# Patient Record
Sex: Male | Born: 1940 | Race: Black or African American | Hispanic: No | Marital: Married | Smoking: Former smoker
Health system: Southern US, Community
[De-identification: ages and names within clinical notes are randomized; demographics above are authoritative.]

## PROBLEM LIST (undated history)

## (undated) DIAGNOSIS — F0390 Unspecified dementia without behavioral disturbance: Secondary | ICD-10-CM

## (undated) DIAGNOSIS — I10 Essential (primary) hypertension: Secondary | ICD-10-CM

## (undated) DIAGNOSIS — Z531 Procedure and treatment not carried out because of patient's decision for reasons of belief and group pressure: Secondary | ICD-10-CM

## (undated) DIAGNOSIS — J449 Chronic obstructive pulmonary disease, unspecified: Secondary | ICD-10-CM

## (undated) DIAGNOSIS — I2510 Atherosclerotic heart disease of native coronary artery without angina pectoris: Secondary | ICD-10-CM

## (undated) DIAGNOSIS — K219 Gastro-esophageal reflux disease without esophagitis: Secondary | ICD-10-CM

## (undated) DIAGNOSIS — I639 Cerebral infarction, unspecified: Secondary | ICD-10-CM

## (undated) DIAGNOSIS — E119 Type 2 diabetes mellitus without complications: Secondary | ICD-10-CM

## (undated) DIAGNOSIS — IMO0001 Reserved for inherently not codable concepts without codable children: Secondary | ICD-10-CM

## (undated) DIAGNOSIS — M1990 Unspecified osteoarthritis, unspecified site: Secondary | ICD-10-CM

## (undated) HISTORY — PX: TESTICLE SURGERY: SHX794

---

## 1962-07-26 HISTORY — PX: OTHER SURGICAL HISTORY: SHX169

## 1999-03-17 ENCOUNTER — Emergency Department (HOSPITAL_COMMUNITY): Admission: EM | Admit: 1999-03-17 | Discharge: 1999-03-17 | Payer: Self-pay

## 2000-05-26 ENCOUNTER — Ambulatory Visit (HOSPITAL_COMMUNITY): Admission: RE | Admit: 2000-05-26 | Discharge: 2000-05-26 | Payer: Self-pay | Admitting: Neurology

## 2000-06-06 ENCOUNTER — Ambulatory Visit (HOSPITAL_COMMUNITY): Admission: RE | Admit: 2000-06-06 | Discharge: 2000-06-06 | Payer: Self-pay | Admitting: Neurology

## 2000-06-06 ENCOUNTER — Encounter: Payer: Self-pay | Admitting: Neurology

## 2000-07-07 ENCOUNTER — Ambulatory Visit (HOSPITAL_COMMUNITY): Admission: RE | Admit: 2000-07-07 | Discharge: 2000-07-07 | Payer: Self-pay | Admitting: *Deleted

## 2000-07-26 HISTORY — PX: TOTAL KNEE ARTHROPLASTY: SHX125

## 2000-11-14 ENCOUNTER — Inpatient Hospital Stay (HOSPITAL_COMMUNITY): Admission: RE | Admit: 2000-11-14 | Discharge: 2000-11-18 | Payer: Self-pay | Admitting: Orthopaedic Surgery

## 2000-12-10 ENCOUNTER — Emergency Department (HOSPITAL_COMMUNITY): Admission: EM | Admit: 2000-12-10 | Discharge: 2000-12-10 | Payer: Self-pay | Admitting: Emergency Medicine

## 2000-12-16 ENCOUNTER — Encounter: Admission: RE | Admit: 2000-12-16 | Discharge: 2001-03-16 | Payer: Self-pay | Admitting: Orthopedic Surgery

## 2001-06-04 ENCOUNTER — Emergency Department (HOSPITAL_COMMUNITY): Admission: EM | Admit: 2001-06-04 | Discharge: 2001-06-04 | Payer: Self-pay

## 2014-01-15 ENCOUNTER — Encounter (HOSPITAL_BASED_OUTPATIENT_CLINIC_OR_DEPARTMENT_OTHER): Payer: Self-pay | Admitting: Emergency Medicine

## 2014-01-15 ENCOUNTER — Emergency Department (HOSPITAL_BASED_OUTPATIENT_CLINIC_OR_DEPARTMENT_OTHER): Payer: Medicare Other

## 2014-01-15 ENCOUNTER — Inpatient Hospital Stay (HOSPITAL_BASED_OUTPATIENT_CLINIC_OR_DEPARTMENT_OTHER)
Admission: EM | Admit: 2014-01-15 | Discharge: 2014-01-18 | DRG: 066 | Disposition: A | Discharge status: Rehabilitation Facility | Payer: Medicare Other | Attending: Internal Medicine | Admitting: Internal Medicine

## 2014-01-15 ENCOUNTER — Inpatient Hospital Stay
Admission: RE | Admit: 2014-01-15 | Payer: Self-pay | Source: Other Acute Inpatient Hospital | Admitting: Internal Medicine

## 2014-01-15 DIAGNOSIS — G589 Mononeuropathy, unspecified: Secondary | ICD-10-CM

## 2014-01-15 DIAGNOSIS — I69998 Other sequelae following unspecified cerebrovascular disease: Secondary | ICD-10-CM

## 2014-01-15 DIAGNOSIS — R413 Other amnesia: Secondary | ICD-10-CM | POA: Diagnosis present

## 2014-01-15 DIAGNOSIS — E1142 Type 2 diabetes mellitus with diabetic polyneuropathy: Secondary | ICD-10-CM | POA: Diagnosis present

## 2014-01-15 DIAGNOSIS — I6350 Cerebral infarction due to unspecified occlusion or stenosis of unspecified cerebral artery: Principal | ICD-10-CM | POA: Diagnosis present

## 2014-01-15 DIAGNOSIS — W19XXXA Unspecified fall, initial encounter: Secondary | ICD-10-CM

## 2014-01-15 DIAGNOSIS — I2510 Atherosclerotic heart disease of native coronary artery without angina pectoris: Secondary | ICD-10-CM | POA: Diagnosis present

## 2014-01-15 DIAGNOSIS — G629 Polyneuropathy, unspecified: Secondary | ICD-10-CM | POA: Diagnosis present

## 2014-01-15 DIAGNOSIS — R471 Dysarthria and anarthria: Secondary | ICD-10-CM | POA: Diagnosis present

## 2014-01-15 DIAGNOSIS — J4489 Other specified chronic obstructive pulmonary disease: Secondary | ICD-10-CM | POA: Diagnosis present

## 2014-01-15 DIAGNOSIS — Z9181 History of falling: Secondary | ICD-10-CM

## 2014-01-15 DIAGNOSIS — E785 Hyperlipidemia, unspecified: Secondary | ICD-10-CM | POA: Diagnosis present

## 2014-01-15 DIAGNOSIS — R29898 Other symptoms and signs involving the musculoskeletal system: Secondary | ICD-10-CM | POA: Diagnosis present

## 2014-01-15 DIAGNOSIS — G459 Transient cerebral ischemic attack, unspecified: Secondary | ICD-10-CM

## 2014-01-15 DIAGNOSIS — F411 Generalized anxiety disorder: Secondary | ICD-10-CM | POA: Diagnosis present

## 2014-01-15 DIAGNOSIS — Z7982 Long term (current) use of aspirin: Secondary | ICD-10-CM

## 2014-01-15 DIAGNOSIS — R001 Bradycardia, unspecified: Secondary | ICD-10-CM | POA: Diagnosis present

## 2014-01-15 DIAGNOSIS — F3289 Other specified depressive episodes: Secondary | ICD-10-CM | POA: Diagnosis present

## 2014-01-15 DIAGNOSIS — M6281 Muscle weakness (generalized): Secondary | ICD-10-CM

## 2014-01-15 DIAGNOSIS — F329 Major depressive disorder, single episode, unspecified: Secondary | ICD-10-CM | POA: Diagnosis present

## 2014-01-15 DIAGNOSIS — Z794 Long term (current) use of insulin: Secondary | ICD-10-CM

## 2014-01-15 DIAGNOSIS — J449 Chronic obstructive pulmonary disease, unspecified: Secondary | ICD-10-CM | POA: Diagnosis present

## 2014-01-15 DIAGNOSIS — R531 Weakness: Secondary | ICD-10-CM | POA: Diagnosis present

## 2014-01-15 DIAGNOSIS — Z87891 Personal history of nicotine dependence: Secondary | ICD-10-CM

## 2014-01-15 DIAGNOSIS — I639 Cerebral infarction, unspecified: Secondary | ICD-10-CM

## 2014-01-15 DIAGNOSIS — E1149 Type 2 diabetes mellitus with other diabetic neurological complication: Secondary | ICD-10-CM | POA: Diagnosis present

## 2014-01-15 DIAGNOSIS — I10 Essential (primary) hypertension: Secondary | ICD-10-CM | POA: Diagnosis present

## 2014-01-15 DIAGNOSIS — K219 Gastro-esophageal reflux disease without esophagitis: Secondary | ICD-10-CM | POA: Diagnosis present

## 2014-01-15 HISTORY — DX: Atherosclerotic heart disease of native coronary artery without angina pectoris: I25.10

## 2014-01-15 HISTORY — DX: Chronic obstructive pulmonary disease, unspecified: J44.9

## 2014-01-15 HISTORY — DX: Essential (primary) hypertension: I10

## 2014-01-15 HISTORY — DX: Cerebral infarction, unspecified: I63.9

## 2014-01-15 LAB — ETHANOL

## 2014-01-15 LAB — COMPREHENSIVE METABOLIC PANEL
ALBUMIN: 4 g/dL (ref 3.5–5.2)
ALK PHOS: 117 U/L (ref 39–117)
ALT: 36 U/L (ref 0–53)
AST: 43 U/L — AB (ref 0–37)
BILIRUBIN TOTAL: 0.7 mg/dL (ref 0.3–1.2)
BUN: 17 mg/dL (ref 6–23)
CO2: 25 meq/L (ref 19–32)
Calcium: 9.6 mg/dL (ref 8.4–10.5)
Chloride: 101 mEq/L (ref 96–112)
Creatinine, Ser: 1 mg/dL (ref 0.50–1.35)
GFR calc Af Amer: 85 mL/min — ABNORMAL LOW (ref >90)
GFR, EST NON AFRICAN AMERICAN: 74 mL/min — AB (ref >90)
Glucose, Bld: 218 mg/dL — ABNORMAL HIGH (ref 70–99)
POTASSIUM: 3.8 meq/L (ref 3.7–5.3)
Sodium: 140 mEq/L (ref 137–147)
Total Protein: 7.5 g/dL (ref 6.0–8.3)

## 2014-01-15 LAB — PROTIME-INR
INR: 1.1 (ref 0.00–1.49)
Prothrombin Time: 14 seconds (ref 11.6–15.2)

## 2014-01-15 LAB — DIFFERENTIAL
BASOS ABS: 0 10*3/uL (ref 0.0–0.1)
BASOS PCT: 0 % (ref 0–1)
Eosinophils Absolute: 0.2 10*3/uL (ref 0.0–0.7)
Eosinophils Relative: 2 % (ref 0–5)
Lymphocytes Relative: 33 % (ref 12–46)
Lymphs Abs: 3.3 10*3/uL (ref 0.7–4.0)
MONO ABS: 0.9 10*3/uL (ref 0.1–1.0)
Monocytes Relative: 9 % (ref 3–12)
NEUTROS PCT: 57 % (ref 43–77)
Neutro Abs: 5.6 10*3/uL (ref 1.7–7.7)

## 2014-01-15 LAB — CBC
HEMATOCRIT: 42.6 % (ref 39.0–52.0)
HEMOGLOBIN: 14.7 g/dL (ref 13.0–17.0)
MCH: 30.8 pg (ref 26.0–34.0)
MCHC: 34.5 g/dL (ref 30.0–36.0)
MCV: 89.3 fL (ref 78.0–100.0)
Platelets: 144 10*3/uL — ABNORMAL LOW (ref 150–400)
RBC: 4.77 MIL/uL (ref 4.22–5.81)
RDW: 13.7 % (ref 11.5–15.5)
WBC: 9.9 10*3/uL (ref 4.0–10.5)

## 2014-01-15 LAB — APTT: aPTT: 27 seconds (ref 24–37)

## 2014-01-15 LAB — TROPONIN I: Troponin I: <0.3 ng/mL (ref <0.30)

## 2014-01-15 MED ORDER — METOPROLOL TARTRATE 50 MG PO TABS
50.0000 mg | ORAL_TABLET | Freq: Two times a day (BID) | ORAL | Status: DC
  Administered 2014-01-15 – 2014-01-16 (×3): 50 mg via ORAL
  Administered 2014-01-17: 25 mg via ORAL
  Filled 2014-01-15: qty 1
  Filled 2014-01-15 (×2): qty 4
  Filled 2014-01-15 (×2): qty 1

## 2014-01-15 MED ORDER — HEPARIN SODIUM (PORCINE) 5000 UNIT/ML IJ SOLN
5000.0000 [IU] | Freq: Three times a day (TID) | INTRAMUSCULAR | Status: DC
Start: 2014-01-15 — End: 2014-01-18
  Administered 2014-01-15 – 2014-01-18 (×8): 5000 [IU] via SUBCUTANEOUS
  Filled 2014-01-15 (×8): qty 1

## 2014-01-15 MED ORDER — PANTOPRAZOLE SODIUM 40 MG PO TBEC
40.0000 mg | DELAYED_RELEASE_TABLET | Freq: Every day | ORAL | Status: DC
  Administered 2014-01-16 – 2014-01-18 (×4): 40 mg via ORAL
  Filled 2014-01-15 (×4): qty 1

## 2014-01-15 MED ORDER — SIMVASTATIN 10 MG PO TABS
10.0000 mg | ORAL_TABLET | Freq: Every day | ORAL | Status: DC
  Filled 2014-01-15: qty 1

## 2014-01-15 MED ORDER — ASPIRIN 81 MG PO CHEW
324.0000 mg | CHEWABLE_TABLET | Freq: Once | ORAL | Status: AC
  Administered 2014-01-15: 324 mg via ORAL

## 2014-01-15 MED ORDER — DONEPEZIL HCL 10 MG PO TABS
10.0000 mg | ORAL_TABLET | Freq: Every day | ORAL | Status: DC
  Administered 2014-01-15 – 2014-01-17 (×3): 10 mg via ORAL
  Filled 2014-01-15 (×3): qty 1

## 2014-01-15 MED ORDER — SIMVASTATIN 40 MG PO TABS
40.0000 mg | ORAL_TABLET | Freq: Every day | ORAL | Status: DC
  Administered 2014-01-15: 40 mg via ORAL
  Filled 2014-01-15: qty 1

## 2014-01-15 MED ORDER — AMLODIPINE BESYLATE 10 MG PO TABS
10.0000 mg | ORAL_TABLET | Freq: Every day | ORAL | Status: DC
  Administered 2014-01-16 – 2014-01-18 (×3): 10 mg via ORAL
  Filled 2014-01-15 (×3): qty 1

## 2014-01-15 MED ORDER — HYDROCODONE-ACETAMINOPHEN 5-325 MG PO TABS
1.0000 | ORAL_TABLET | Freq: Four times a day (QID) | ORAL | Status: DC | PRN
  Administered 2014-01-16 – 2014-01-18 (×5): 1 via ORAL
  Filled 2014-01-15 (×6): qty 1

## 2014-01-15 MED ORDER — ALBUTEROL SULFATE HFA 108 (90 BASE) MCG/ACT IN AERS: 1.0000 | INHALATION_SPRAY | Freq: Four times a day (QID) | RESPIRATORY_TRACT | Status: DC | PRN

## 2014-01-15 MED ORDER — INSULIN ASPART 100 UNIT/ML ~~LOC~~ SOLN
0.0000 [IU] | Freq: Every day | SUBCUTANEOUS | Status: DC
  Administered 2014-01-15: 5 [IU] via SUBCUTANEOUS
  Administered 2014-01-17: 2 [IU] via SUBCUTANEOUS

## 2014-01-15 MED ORDER — SERTRALINE HCL 100 MG PO TABS
200.0000 mg | ORAL_TABLET | Freq: Every day | ORAL | Status: DC
  Administered 2014-01-16 – 2014-01-18 (×3): 200 mg via ORAL
  Filled 2014-01-15: qty 2
  Filled 2014-01-15: qty 4
  Filled 2014-01-15: qty 2

## 2014-01-15 MED ORDER — BUPROPION HCL 100 MG PO TABS
100.0000 mg | ORAL_TABLET | Freq: Two times a day (BID) | ORAL | Status: DC
  Administered 2014-01-15 – 2014-01-18 (×6): 100 mg via ORAL
  Filled 2014-01-15 (×7): qty 1

## 2014-01-15 MED ORDER — QUETIAPINE FUMARATE 200 MG PO TABS
200.0000 mg | ORAL_TABLET | Freq: Every day | ORAL | Status: DC
  Administered 2014-01-17: 200 mg via ORAL
  Filled 2014-01-15: qty 1
  Filled 2014-01-15: qty 8
  Filled 2014-01-15: qty 1

## 2014-01-15 MED ORDER — ASPIRIN 325 MG PO TABS
325.0000 mg | ORAL_TABLET | Freq: Every day | ORAL | Status: DC
  Administered 2014-01-15 – 2014-01-17 (×3): 325 mg via ORAL
  Filled 2014-01-15 (×3): qty 1

## 2014-01-15 MED ORDER — SULINDAC 150 MG PO TABS
150.0000 mg | ORAL_TABLET | Freq: Two times a day (BID) | ORAL | Status: DC
  Administered 2014-01-16 – 2014-01-18 (×5): 150 mg via ORAL
  Filled 2014-01-15 (×7): qty 1

## 2014-01-15 MED ORDER — GABAPENTIN 600 MG PO TABS
600.0000 mg | ORAL_TABLET | Freq: Three times a day (TID) | ORAL | Status: DC
  Administered 2014-01-15 – 2014-01-18 (×8): 600 mg via ORAL
  Filled 2014-01-15 (×10): qty 1

## 2014-01-15 MED ORDER — INSULIN GLARGINE 100 UNIT/ML ~~LOC~~ SOLN
30.0000 [IU] | Freq: Two times a day (BID) | SUBCUTANEOUS | Status: DC
  Administered 2014-01-15: 30 [IU] via SUBCUTANEOUS
  Filled 2014-01-15 (×3): qty 0.3

## 2014-01-15 MED ORDER — LORATADINE 10 MG PO TABS
10.0000 mg | ORAL_TABLET | Freq: Every day | ORAL | Status: DC
  Administered 2014-01-16 – 2014-01-18 (×3): 10 mg via ORAL
  Filled 2014-01-15 (×3): qty 1

## 2014-01-15 MED ORDER — ASPIRIN 81 MG PO CHEW
CHEWABLE_TABLET | ORAL | Status: AC
  Filled 2014-01-15: qty 4

## 2014-01-15 MED ORDER — LOSARTAN POTASSIUM 50 MG PO TABS
100.0000 mg | ORAL_TABLET | Freq: Every day | ORAL | Status: DC
  Administered 2014-01-16 – 2014-01-18 (×3): 100 mg via ORAL
  Filled 2014-01-15 (×3): qty 2

## 2014-01-15 MED ORDER — ALBUTEROL SULFATE (2.5 MG/3ML) 0.083% IN NEBU: 2.5000 mg | INHALATION_SOLUTION | RESPIRATORY_TRACT | Status: DC | PRN

## 2014-01-15 MED ORDER — INSULIN ASPART 100 UNIT/ML ~~LOC~~ SOLN: 15.0000 [IU] | Freq: Three times a day (TID) | SUBCUTANEOUS | Status: DC

## 2014-01-15 MED ORDER — INSULIN ASPART 100 UNIT/ML ~~LOC~~ SOLN
0.0000 [IU] | Freq: Three times a day (TID) | SUBCUTANEOUS | Status: DC
  Administered 2014-01-16: 5 [IU] via SUBCUTANEOUS
  Administered 2014-01-16: 11 [IU] via SUBCUTANEOUS
  Administered 2014-01-17: 5 [IU] via SUBCUTANEOUS
  Administered 2014-01-17: 11 [IU] via SUBCUTANEOUS
  Administered 2014-01-18: 5 [IU] via SUBCUTANEOUS

## 2014-01-15 NOTE — Progress Notes (Signed)
Paged [facility] concerning physician coming to see him and patient orders.

## 2014-01-15 NOTE — ED Notes (Signed)
Pt to room 6 by [facility]. Pt is awake and alert, reports fall today "my leg gave out on me..." pt states he has had issues with weakness in this leg "for a long time" and has been seen by his pcp for same with no findings per pt. Pt states he hit his face on a hardwood floor, small dime sized red area beneath left eye, pt denies loc.  Pt states he is having low back pain, states he fractured his tailbone a week ago and cont to have pain.

## 2014-01-15 NOTE — ED Notes (Signed)
Pt states he has had a stroke in the past, and has some residual slight slurring of his speech, and the weakness in his right leg. Pt states both "been going on for years" denies any new neurological sx today.

## 2014-01-15 NOTE — ED Provider Notes (Signed)
CSN: [id number]     Arrival date & time 01/15/14  1407 History   First MD Initiated Contact with Patient 01/15/14 1425     Chief Complaint  Patient presents with  . Fall     (Consider location/radiation/quality/duration/timing/severity/associated sxs/prior Treatment) HPI Comments: He presents for evaluation of multiple falls over the last 3 days, resulting in facial bruising. He denies LOC, nausea vomiting or other illness. He feels that his right leg "gives out on me" and he falls. He states this has been going on for some time but has happened more than 10 times in the past 3 days. He reports he also has a facial droop around his mouth and he has been dropping items held in his right hand. He denies headaches, visual changes or numbness/tingling. The facial symptoms and hand weakness have been going on for the past 3 days. He denies any difficulty with word finding but has trouble speaking the words.   The history is provided by the patient. No language interpreter was used.     Past Medical History  Diagnosis Date  . Hypertension   . Diabetes mellitus without complication   . Coronary artery disease   . COPD (chronic obstructive pulmonary disease)   . Stroke    No past surgical history on file. No family history on file. History  Substance Use Topics  . Smoking status: Former Research scientist (life sciences)  . Smokeless tobacco: Not on file  . Alcohol Use: Not on file    Review of Systems  Constitutional: Negative for fever.  Eyes: Negative for visual disturbance.  Respiratory: Negative for shortness of breath.   Cardiovascular: Negative for chest pain.  Gastrointestinal: Negative for vomiting and abdominal pain.  Genitourinary: Negative for dysuria.  Musculoskeletal: Positive for back pain.  Skin: Positive for color change.  Neurological: Positive for facial asymmetry, speech difficulty and weakness. Negative for syncope, numbness and headaches.      Allergies  Lisinopril  Home  Medications   Prior to Admission medications   Medication Sig Start Date End Date Taking? Authorizing Provider  albuterol (PROVENTIL HFA;VENTOLIN HFA) 108 (90 BASE) MCG/ACT inhaler Inhale into the lungs every 6 (six) hours as needed for wheezing or shortness of breath.   Yes Historical Provider, MD  amLODipine (NORVASC) 10 MG tablet Take 10 mg by mouth daily.   Yes Historical Provider, MD  aspirin 81 MG tablet Take 81 mg by mouth daily.   Yes Historical Provider, MD  buPROPion (WELLBUTRIN) 100 MG tablet Take 100 mg by mouth 2 (two) times daily.   Yes Historical Provider, MD  carboxymethylcellulose (REFRESH PLUS) 0.5 % SOLN 1 drop 3 (three) times daily as needed.   Yes Historical Provider, MD  Cholecalciferol 2000 UNITS CAPS Take 4,000 Units by mouth daily.   Yes Historical Provider, MD  donepezil (ARICEPT) 10 MG tablet Take 10 mg by mouth at bedtime.   Yes Historical Provider, MD  gabapentin (NEURONTIN) 600 MG tablet Take 600 mg by mouth 3 (three) times daily.   Yes Historical Provider, MD  HYDROcodone-acetaminophen (NORCO/VICODIN) 5-325 MG per tablet Take 1 tablet by mouth every 6 (six) hours as needed for moderate pain.   Yes Historical Provider, MD  insulin aspart (NOVOLOG) 100 UNIT/ML injection Inject 15 Units into the skin 3 (three) times daily with meals.   Yes Historical Provider, MD  loratadine (CLARITIN) 10 MG tablet Take 10 mg by mouth daily.   Yes Historical Provider, MD  losartan (COZAAR) 100 MG tablet Take 100 mg  by mouth daily.   Yes Historical Provider, MD  metoprolol (LOPRESSOR) 50 MG tablet Take 50 mg by mouth 2 (two) times daily.   Yes Historical Provider, MD  pantoprazole (PROTONIX) 40 MG tablet Take 40 mg by mouth daily.   Yes Historical Provider, MD  pravastatin (PRAVACHOL) 20 MG tablet Take 20 mg by mouth daily.   Yes Historical Provider, MD  QUEtiapine (SEROQUEL) 200 MG tablet Take 200 mg by mouth at bedtime.   Yes Historical Provider, MD  sertraline (ZOLOFT) 100 MG tablet  Take 200 mg by mouth daily.   Yes Historical Provider, MD  sulindac (CLINORIL) 150 MG tablet Take 150 mg by mouth 2 (two) times daily.   Yes Historical Provider, MD   BP 137/73  Pulse 61  Temp(Src) 98.1 F (36.7 C) (Oral)  Resp 18  SpO2 93% Physical Exam  Constitutional: He is oriented to person, place, and time. He appears well-developed and well-nourished.  HENT:  Head: Normocephalic.  Neck: Normal range of motion. Neck supple.  Cardiovascular: Normal rate and regular rhythm.   Pulmonary/Chest: Effort normal and breath sounds normal.  Abdominal: Soft. Bowel sounds are normal. There is no tenderness. There is no rebound and no guarding.  Musculoskeletal: Normal range of motion.  Neurological: He is alert and oriented to person, place, and time. Coordination normal.  Weakness of right upper and lower extremity. There is facial asymmetry involving the right side mouth. No tongue deviation. He is oriented, memory intact with good insight and judgement.   Skin: Skin is warm and dry. No rash noted.  Psychiatric: He has a normal mood and affect.    ED Course  Procedures (including critical care time) Labs Review Labs Reviewed  CBC - Abnormal; Notable for the following:    Platelets 144 (*)    All other components within normal limits  COMPREHENSIVE METABOLIC PANEL - Abnormal; Notable for the following:    Glucose, Bld 218 (*)    AST 43 (*)    GFR calc non Af Amer 74 (*)    GFR calc Af Amer 85 (*)    All other components within normal limits  ETHANOL  PROTIME-INR  APTT  DIFFERENTIAL  TROPONIN I  URINE RAPID DRUG SCREEN (HOSP PERFORMED)  URINALYSIS, ROUTINE W REFLEX MICROSCOPIC    Imaging Review Dg Lumbar Spine Complete  01/15/2014   CLINICAL DATA:  Low back pain, continuous falls  EXAM: LUMBAR SPINE - COMPLETE 4+ VIEW  COMPARISON:  None.  FINDINGS: There are 5 non rib-bearing lumbar type vertebral bodies.  There is a mild scoliotic curvature of the thoracolumbar spine with  dominant inferior curvature convex to the left. There is mild straightening the expected lumbar lordosis. No anterolisthesis or retrolisthesis.  Lumbar vertebral body heights are preserved.  There is mild-to-moderate multilevel lumbar spine DDD, worse at T11-T12, L1-L2 and L5-S1 with disc space height loss, endplate irregularity and sclerosis.  Limited visualization the bilateral SI joints is normal.  Regional soft tissues appear normal.  IMPRESSION: 1. No acute findings. 2. Mild-to-moderate multilevel lumbar spine DDD.   Electronically Signed   By: Sandi Mariscal M.D.   On: 01/15/2014 15:26   Ct Head Wo Contrast  01/15/2014   CLINICAL DATA:  Post fall, now with injury in weakness  EXAM: CT HEAD WITHOUT CONTRAST  TECHNIQUE: Contiguous axial images were obtained from the base of the skull through the vertex without intravenous contrast.  COMPARISON:  None.  FINDINGS: There is an old lacunar infarct within the  left the coronal radiata (image 19, series 2). Scattered minimal periventricular hypodensities compatible with microvascular ischemic disease. The gray-white differentiation is otherwise well maintained without CT evidence of acute large territory infarct. No intraparenchymal or extra-axial mass or hemorrhage. Normal size and configuration of the ventricles and basilar cisterns. No midline shift. Limited visualization of the paranasal sinuses and mastoid air cells are normal. No air-fluid levels. Intracranial atherosclerosis. Regional soft tissues appear normal. No displaced calvarial fracture.  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   Electronically Signed   By: Sandi Mariscal M.D.   On: 01/15/2014 15:28     EKG Interpretation   Date/Time:  Tuesday January 15 2014 14:58:48 EDT Ventricular Rate:  55 PR Interval:  166 QRS Duration: 114 QT Interval:  452 QTC Calculation: 432 R Axis:   -48 Text Interpretation:  Sinus bradycardia Left anterior fascicular block   Nonspecific T wave abnormality Abnormal ECG Since last tracing rate slower  Confirmed by BELFI  MD, MELANIE ([id number]) on 01/15/2014 3:11:47 PM      MDM   Final diagnoses:  None    1. CVA  The patient has had symptoms of unilateral weakness, including facial asymmetry for the past 3 days. Not a code stroke, but neurologic event suspected. Discussed with [facility] who accepts the patient for admission. Discussed with Dr. Aram Beecham who will provide neurologic consultation.     Dewaine Oats, PA-C 01/20/14 0040

## 2014-01-15 NOTE — Consult Note (Signed)
Referring Physician: Posey Pronto    Chief Complaint: Right sided weakness, falls  HPI: Maurice Stone is an 73 y.o. male who reports that he has been falling for the past 6 years.  It has become more and more frequent and became a real issue this weekend.  He reports that he felt as if his right leg was weak.  Patient was seen by another facility at that time and discharged to home.  Patient's family is in the room tonight.  They report that visit was on Friday and they did not note a facial droop at that time.  He was seen again on Sunday and no facial droop was noted.  When his daughter talked to him on the phone yesterday she felt that his speech was slurred.  Today when sen by family he was felt to have a facial droop and was taken again to the ED.  On this evaluation he was felt to have right sided weakness and a possible stroke.  Patient was transferred here for further evaluation.    Date last known well: Unable to determine Time last known well: Unable to determine tPA Given: No: Unable to determine LKW  Past Medical History  Diagnosis Date  . Hypertension   . Diabetes mellitus without complication   . Coronary artery disease   . COPD (chronic obstructive pulmonary disease)   . Stroke     No past surgical history on file.  Family history: Mother deceased with DM.  Father deceased but no known medical problems.    Social History:  reports that he has quit smoking. He does not have any smokeless tobacco history on file. His alcohol and drug histories are not on file.  Allergies:  Allergies  Allergen Reactions  . Lisinopril Cough    Medications:  I have reviewed the patient's current medications. Prior to Admission:  Prescriptions prior to admission  Medication Sig Dispense Refill  . albuterol (PROVENTIL HFA;VENTOLIN HFA) 108 (90 BASE) MCG/ACT inhaler Inhale into the lungs every 6 (six) hours as needed for wheezing or shortness of breath.      Marland Kitchen amLODipine (NORVASC) 10 MG  tablet Take 10 mg by mouth daily.      Marland Kitchen aspirin 81 MG tablet Take 81 mg by mouth daily.      Marland Kitchen buPROPion (WELLBUTRIN) 100 MG tablet Take 100 mg by mouth 2 (two) times daily.      . carboxymethylcellulose (REFRESH PLUS) 0.5 % SOLN 1 drop 3 (three) times daily as needed.      . Cholecalciferol 2000 UNITS CAPS Take 4,000 Units by mouth daily.      Marland Kitchen donepezil (ARICEPT) 10 MG tablet Take 10 mg by mouth at bedtime.      . gabapentin (NEURONTIN) 600 MG tablet Take 600 mg by mouth 3 (three) times daily.      Marland Kitchen HYDROcodone-acetaminophen (NORCO/VICODIN) 5-325 MG per tablet Take 1 tablet by mouth every 6 (six) hours as needed for moderate pain.      Marland Kitchen insulin aspart (NOVOLOG) 100 UNIT/ML injection Inject 15 Units into the skin 3 (three) times daily with meals.      . insulin glargine (LANTUS) 100 UNIT/ML injection Inject 50 Units into the skin 2 (two) times daily.      Marland Kitchen loratadine (CLARITIN) 10 MG tablet Take 10 mg by mouth daily.      Marland Kitchen losartan (COZAAR) 100 MG tablet Take 100 mg by mouth daily.      . metoprolol (LOPRESSOR) 50  MG tablet Take 50 mg by mouth 2 (two) times daily.      . pantoprazole (PROTONIX) 40 MG tablet Take 40 mg by mouth daily.      . pravastatin (PRAVACHOL) 20 MG tablet Take 20 mg by mouth daily.      . QUEtiapine (SEROQUEL) 200 MG tablet Take 200 mg by mouth at bedtime.      . sertraline (ZOLOFT) 100 MG tablet Take 200 mg by mouth daily.      . sulindac (CLINORIL) 150 MG tablet Take 150 mg by mouth 2 (two) times daily.       Scheduled: . [START ON 01/16/2014] amLODipine  10 mg Oral Daily  . aspirin  325 mg Oral Daily  . buPROPion  100 mg Oral BID  . donepezil  10 mg Oral QHS  . gabapentin  600 mg Oral TID  . heparin  5,000 Units Subcutaneous 3 times per day  . [START ON 01/16/2014] insulin aspart  0-15 Units Subcutaneous TID WC  . insulin aspart  0-5 Units Subcutaneous QHS  . [START ON 01/16/2014] insulin aspart  15 Units Subcutaneous TID WC  . insulin glargine  30 Units  Subcutaneous BID  . [START ON 01/16/2014] loratadine  10 mg Oral Daily  . [START ON 01/16/2014] losartan  100 mg Oral Daily  . metoprolol  50 mg Oral BID  . pantoprazole  40 mg Oral Daily  . QUEtiapine  200 mg Oral QHS  . [START ON 01/16/2014] sertraline  200 mg Oral Daily  . simvastatin  10 mg Oral q1800  . simvastatin  40 mg Oral q1800  . [START ON 01/16/2014] sulindac  150 mg Oral BID WC    ROS: History obtained from the patient  General ROS: negative for - chills, fatigue, fever, night sweats, weight gain or weight loss Psychological ROS: negative for - behavioral disorder, hallucinations, memory difficulties, mood swings or suicidal ideation Ophthalmic ROS: negative for - blurry vision, double vision, eye pain or loss of vision ENT ROS: negative for - epistaxis, nasal discharge, oral lesions, sore throat, tinnitus or vertigo Allergy and Immunology ROS: negative for - hives or itchy/watery eyes Hematological and Lymphatic ROS: negative for - bleeding problems, bruising or swollen lymph nodes Endocrine ROS: negative for - galactorrhea, hair pattern changes, polydipsia/polyuria or temperature intolerance Respiratory ROS: negative for - cough, hemoptysis, shortness of breath or wheezing Cardiovascular ROS: negative for - chest pain, dyspnea on exertion, edema or irregular heartbeat Gastrointestinal ROS: negative for - abdominal pain, diarrhea, hematemesis, nausea/vomiting or stool incontinence Genito-Urinary ROS: negative for - dysuria, hematuria, incontinence or urinary frequency/urgency Musculoskeletal ROS: negative for - joint swelling or muscular weakness Neurological ROS: as noted in HPI Dermatological ROS: negative for rash and skin lesion changes  Physical Examination: Blood pressure 131/78, pulse 51, temperature 98.1 F (36.7 C), temperature source Oral, resp. rate 18, SpO2 100.00%.  Neurologic Examination: Mental Status: Alert, oriented, thought content appropriate.  Speech  fluent without evidence of aphasia but some dysarthria noted.  Able to follow 3 step commands without difficulty. Cranial Nerves: II: Discs flat bilaterally; Visual fields grossly normal, pupils equal, round, reactive to light and accommodation III,IV, VI: ptosis not present, extra-ocular motions intact bilaterally V,VII: right facial droop, facial light touch sensation normal bilaterally VIII: hearing normal bilaterally IX,X: gag reflex present XI: bilateral shoulder shrug XII: midline tongue extension Motor: Right : Upper extremity   4/5 with drift   Left:     Upper extremity   5/5  Lower extremity   4-/5      Lower extremity   5/5 Tone and bulk:normal tone throughout; no atrophy noted Sensory: Pinprick and light touch intact throughout, bilaterally Deep Tendon Reflexes: 2+ in the upper extremities, absent in the lower extremities Plantars: Right: mute   Left: downgoing Cerebellar: normal finger-to-nose and normal heel-to-shin testing Gait: Unable to test for safety concerns CV: pulses palpable throughout     Laboratory Studies:  Basic Metabolic Panel:  Recent Labs Lab 01/15/14 1445  NA 140  K 3.8  CL 101  CO2 25  GLUCOSE 218*  BUN 17  CREATININE 1.00  CALCIUM 9.6    Liver Function Tests:  Recent Labs Lab 01/15/14 1445  AST 43*  ALT 36  ALKPHOS 117  BILITOT 0.7  PROT 7.5  ALBUMIN 4.0   No results found for this basename: LIPASE, AMYLASE,  in the last 168 hours No results found for this basename: AMMONIA,  in the last 168 hours  CBC:  Recent Labs Lab 01/15/14 1445  WBC 9.9  NEUTROABS 5.6  HGB 14.7  HCT 42.6  MCV 89.3  PLT 144*    Cardiac Enzymes:  Recent Labs Lab 01/15/14 1445  TROPONINI <0.30    BNP: No components found with this basename: POCBNP,   CBG: No results found for this basename: GLUCAP,  in the last 168 hours  Microbiology: No results found for this or any previous visit.  Coagulation Studies:  Recent Labs   01/15/14 1445  LABPROT 14.0  INR 1.10    Urinalysis: No results found for this basename: COLORURINE, APPERANCEUR, LABSPEC, PHURINE, GLUCOSEU, HGBUR, BILIRUBINUR, KETONESUR, PROTEINUR, UROBILINOGEN, NITRITE, LEUKOCYTESUR,  in the last 168 hours  Lipid Panel: No results found for this basename: chol, trig, hdl, cholhdl, vldl, ldlcalc    HgbA1C:  No results found for this basename: HGBA1C    Urine Drug Screen:   No results found for this basename: labopia, cocainscrnur, labbenz, amphetmu, thcu, labbarb    Alcohol Level:  Recent Labs Lab 01/15/14 1445  ETH <11    Other results: EKG: sinus bradycardia at 55 bpm.  Imaging: Dg Lumbar Spine Complete  01/15/2014   CLINICAL DATA:  Low back pain, continuous falls  EXAM: LUMBAR SPINE - COMPLETE 4+ VIEW  COMPARISON:  None.  FINDINGS: There are 5 non rib-bearing lumbar type vertebral bodies.  There is a mild scoliotic curvature of the thoracolumbar spine with dominant inferior curvature convex to the left. There is mild straightening the expected lumbar lordosis. No anterolisthesis or retrolisthesis.  Lumbar vertebral body heights are preserved.  There is mild-to-moderate multilevel lumbar spine DDD, worse at T11-T12, L1-L2 and L5-S1 with disc space height loss, endplate irregularity and sclerosis.  Limited visualization the bilateral SI joints is normal.  Regional soft tissues appear normal.  IMPRESSION: 1. No acute findings. 2. Mild-to-moderate multilevel lumbar spine DDD.   Electronically Signed   By: Sandi Mariscal M.D.   On: 01/15/2014 15:26   Ct Head Wo Contrast  01/15/2014   CLINICAL DATA:  Post fall, now with injury in weakness  EXAM: CT HEAD WITHOUT CONTRAST  TECHNIQUE: Contiguous axial images were obtained from the base of the skull through the vertex without intravenous contrast.  COMPARISON:  None.  FINDINGS: There is an old lacunar infarct within the left the coronal radiata (image 19, series 2). Scattered minimal periventricular  hypodensities compatible with microvascular ischemic disease. The gray-white differentiation is otherwise well maintained without CT evidence of acute large territory infarct. No  intraparenchymal or extra-axial mass or hemorrhage. Normal size and configuration of the ventricles and basilar cisterns. No midline shift. Limited visualization of the paranasal sinuses and mastoid air cells are normal. No air-fluid levels. Intracranial atherosclerosis. Regional soft tissues appear normal. No displaced calvarial fracture.  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   Electronically Signed   By: Sandi Mariscal M.D.   On: 01/15/2014 15:28    Assessment: 73 y.o. male presenting with right sided weakness. Neurological examination consistent with a left brain infarct.  Head CT reviewed and shows no acute changes.  Patient with multiple stroke risk factors.  Further work up recommended.  Patient on an aspirin a day at home.  Stroke Risk Factors - diabetes mellitus and hypertension  Plan: 1. HgbA1c, fasting lipid panel 2. MRI, MRA  of the brain without contrast 3. PT consult, OT consult, Speech consult 4. Echocardiogram 5. Carotid dopplers 6. Prophylactic therapy-Antiplatelet med: Aspirin - dose 325mg  daily 7. Risk factor modification 8. Telemetry monitoring 9. Frequent neuro checks   Alexis Goodell, MD [facility] [phone] 01/15/2014, 10:35 PM

## 2014-01-15 NOTE — Progress Notes (Signed)
Maurice Stone, is a 73 y.o. male, DOB - 1940-09-23, YBO:[id number]  H/O CVA, comes HTN, type 2 diabetes mellitus, GERD comes in with two-day history of poor balance with multiple falls, slurred speech along with weakness in his right leg, CT scan at [place] stable, however physical exam consistent of CVA. [facility] be transferred [facility] for further stroke workup, ED physician will inform neurology. Kindly call neurology upon arrival. Hemodynamically stable, requested to give 1 dose of full dose aspirin along with home dose statin right now.     Filed Vitals:   01/15/14 1422 01/15/14 1527 01/15/14 1540  BP: 137/73  129/68  Pulse: 61  53  Temp: 98.1 F (36.7 C) 98.1 F (36.7 C) 98.1 F (36.7 C)  TempSrc: Oral  Oral  Resp: 18    SpO2: 93%  97%        Data Review   Micro Results No results found for this or any previous visit (from the past 240 hour(s)).  Radiology Reports Dg Lumbar Spine Complete  01/15/2014   CLINICAL DATA:  Low back pain, continuous falls  EXAM: LUMBAR SPINE - COMPLETE 4+ VIEW  COMPARISON:  None.  FINDINGS: There are 5 non rib-bearing lumbar type vertebral bodies.  There is a mild scoliotic curvature of the thoracolumbar spine with dominant inferior curvature convex to the left. There is mild straightening the expected lumbar lordosis. No anterolisthesis or retrolisthesis.  Lumbar vertebral body heights are preserved.  There is mild-to-moderate multilevel lumbar spine DDD, worse at T11-T12, L1-L2 and L5-S1 with disc space height loss, endplate irregularity and sclerosis.  Limited visualization the bilateral SI joints is normal.  Regional soft tissues appear normal.  IMPRESSION: 1. No acute findings. 2. Mild-to-moderate multilevel lumbar spine DDD.   Electronically Signed   By: Sandi Mariscal M.D.   On: 01/15/2014 15:26   Ct Head Wo Contrast  01/15/2014   CLINICAL DATA:  Post fall, now with injury in weakness  EXAM: CT HEAD WITHOUT CONTRAST  TECHNIQUE:  Contiguous axial images were obtained from the base of the skull through the vertex without intravenous contrast.  COMPARISON:  None.  FINDINGS: There is an old lacunar infarct within the left the coronal radiata (image 19, series 2). Scattered minimal periventricular hypodensities compatible with microvascular ischemic disease. The gray-white differentiation is otherwise well maintained without CT evidence of acute large territory infarct. No intraparenchymal or extra-axial mass or hemorrhage. Normal size and configuration of the ventricles and basilar cisterns. No midline shift. Limited visualization of the paranasal sinuses and mastoid air cells are normal. No air-fluid levels. Intracranial atherosclerosis. Regional soft tissues appear normal. No displaced calvarial fracture.  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   Electronically Signed   By: Sandi Mariscal M.D.   On: 01/15/2014 15:28    CBC  Recent Labs Lab 01/15/14 1445  WBC 9.9  HGB 14.7  HCT 42.6  PLT 144*  MCV 89.3  MCH 30.8  MCHC 34.5  RDW 13.7  LYMPHSABS 3.3  MONOABS 0.9  EOSABS 0.2  BASOSABS 0.0    Chemistries   Recent Labs Lab 01/15/14 1445  NA 140  K 3.8  CL 101  CO2 25  GLUCOSE 218*  BUN 17  CREATININE 1.00  CALCIUM 9.6  AST 43*  ALT 36  ALKPHOS 117  BILITOT 0.7   ------------------------------------------------------------------------------------------------------------------ CrCl is unknown because there is no height on file for the current visit. ------------------------------------------------------------------------------------------------------------------ No results found for this basename: HGBA1C,  in the last 72 hours ------------------------------------------------------------------------------------------------------------------ No results found for this basename: CHOL, HDL, LDLCALC, TRIG, CHOLHDL, LDLDIRECT,  in the last 72  hours ------------------------------------------------------------------------------------------------------------------ No results found for this basename: TSH, T4TOTAL, FREET3, T3FREE, THYROIDAB,  in the last 72 hours ------------------------------------------------------------------------------------------------------------------ No results found for this basename: VITAMINB12, FOLATE, FERRITIN, TIBC, IRON, RETICCTPCT,  in the last 72 hours  Coagulation profile  Recent Labs Lab 01/15/14 1445  INR 1.10    No results found for this basename: DDIMER,  in the last 72 hours  Cardiac Enzymes  Recent Labs Lab 01/15/14 1445  TROPONINI <0.30   ------------------------------------------------------------------------------------------------------------------ No components found with this basename: POCBNP,

## 2014-01-15 NOTE — H&P (Signed)
[facility] History and Physical  Patient: Maurice Stone  WCH:[id number]  DOB: 1941/01/12  DOS: the patient was seen and examined on 01/15/2014 PCP: No primary provider on file.  Chief Complaint: Fall  HPI: Maurice Stone is a 73 y.o. male with Past medical history of Hypertension, diabetes, coronary artery disease, COPD, CVA, peripheral neuropathy. Patient presents with complaints of multiple fall he mentions that since last he has been having multiple recurrent fall. All the fall happened while he has been walking his right leg suddenly gives away. He mentions he has nearly 10-15 falls in the last 2 days. He has been admitted at the [facility] twice for the same complaint in the last 1 month at which time he was discharged home as they were not able to find any objective patient. He mentions he had last MRI done 4 months ago. There was a history that he has further or slurring of speech as well as drop in his mouth but he mentions he has a slurred speech at his baseline somewhat. Denies any complaint of fever, chills, chest pain, nausea, vomiting, diarrhea, burning urination. He mentions he had hit his head and has some pain and redness there and also has fell on his buttocks and has some pain on his tailbone.  The patient is coming from home. And at his baseline independent for most of his ADL.  Review of Systems: as mentioned in the history of present illness.  A Comprehensive review of the other systems is negative.  Past Medical History  Diagnosis Date  . Hypertension   . Diabetes mellitus without complication   . Coronary artery disease   . COPD (chronic obstructive pulmonary disease)   . Stroke    No past surgical history on file. Social History:  reports that he has quit smoking. He does not have any smokeless tobacco history on file. His alcohol and drug histories are not on file.  Allergies  Allergen Reactions  . Lisinopril Cough    No family history on  file.  Prior to Admission medications   Medication Sig Start Date End Date Taking? Authorizing Provider  albuterol (PROVENTIL HFA;VENTOLIN HFA) 108 (90 BASE) MCG/ACT inhaler Inhale into the lungs every 6 (six) hours as needed for wheezing or shortness of breath.   Yes Historical Provider, MD  amLODipine (NORVASC) 10 MG tablet Take 10 mg by mouth daily.   Yes Historical Provider, MD  aspirin 81 MG tablet Take 81 mg by mouth daily.   Yes Historical Provider, MD  buPROPion (WELLBUTRIN) 100 MG tablet Take 100 mg by mouth 2 (two) times daily.   Yes Historical Provider, MD  carboxymethylcellulose (REFRESH PLUS) 0.5 % SOLN 1 drop 3 (three) times daily as needed.   Yes Historical Provider, MD  Cholecalciferol 2000 UNITS CAPS Take 4,000 Units by mouth daily.   Yes Historical Provider, MD  donepezil (ARICEPT) 10 MG tablet Take 10 mg by mouth at bedtime.   Yes Historical Provider, MD  gabapentin (NEURONTIN) 600 MG tablet Take 600 mg by mouth 3 (three) times daily.   Yes Historical Provider, MD  HYDROcodone-acetaminophen (NORCO/VICODIN) 5-325 MG per tablet Take 1 tablet by mouth every 6 (six) hours as needed for moderate pain.   Yes Historical Provider, MD  insulin aspart (NOVOLOG) 100 UNIT/ML injection Inject 15 Units into the skin 3 (three) times daily with meals.   Yes Historical Provider, MD  insulin glargine (LANTUS) 100 UNIT/ML injection Inject 50 Units into the skin 2 (two)  times daily.   Yes Historical Provider, MD  loratadine (CLARITIN) 10 MG tablet Take 10 mg by mouth daily.   Yes Historical Provider, MD  losartan (COZAAR) 100 MG tablet Take 100 mg by mouth daily.   Yes Historical Provider, MD  metoprolol (LOPRESSOR) 50 MG tablet Take 50 mg by mouth 2 (two) times daily.   Yes Historical Provider, MD  pantoprazole (PROTONIX) 40 MG tablet Take 40 mg by mouth daily.   Yes Historical Provider, MD  pravastatin (PRAVACHOL) 20 MG tablet Take 20 mg by mouth daily.   Yes Historical Provider, MD  QUEtiapine  (SEROQUEL) 200 MG tablet Take 200 mg by mouth at bedtime.   Yes Historical Provider, MD  sertraline (ZOLOFT) 100 MG tablet Take 200 mg by mouth daily.   Yes Historical Provider, MD  sulindac (CLINORIL) 150 MG tablet Take 150 mg by mouth 2 (two) times daily.   Yes Historical Provider, MD    Physical Exam: Filed Vitals:   01/15/14 1527 01/15/14 1540 01/15/14 1715 01/15/14 1900  BP:  129/68 127/71 132/76  Pulse:  53 53 52  Temp: 98.1 F (36.7 C) 98.1 F (36.7 C) 98.7 F (37.1 C) 98.2 F (36.8 C)  TempSrc:  Oral  Oral  Resp:      SpO2:  97% 97% 100%    General: Alert, Awake and Oriented to Time, Place and Person. Appear in mild distress Eyes: PERRL ENT: Oral Mucosa clear moist. Neck: no JVD Cardiovascular: S1 and S2 Present, no Murmur, Peripheral Pulses Present Respiratory: Bilateral Air entry equal and Decreased, Clear to Auscultation,  no Crackles,no wheezes Abdomen: Bowel Sound Present, Soft and Non tender Skin: no Rash Extremities: no Pedal edema, no calf tenderness Neurologic: Grossly no focal neuro deficit.   Labs on Admission:  CBC:  Recent Labs Lab 01/15/14 1445  WBC 9.9  NEUTROABS 5.6  HGB 14.7  HCT 42.6  MCV 89.3  PLT 144*    CMP     Component Value Date/Time   NA 140 01/15/2014 1445   K 3.8 01/15/2014 1445   CL 101 01/15/2014 1445   CO2 25 01/15/2014 1445   GLUCOSE 218* 01/15/2014 1445   BUN 17 01/15/2014 1445   CREATININE 1.00 01/15/2014 1445   CALCIUM 9.6 01/15/2014 1445   PROT 7.5 01/15/2014 1445   ALBUMIN 4.0 01/15/2014 1445   AST 43* 01/15/2014 1445   ALT 36 01/15/2014 1445   ALKPHOS 117 01/15/2014 1445   BILITOT 0.7 01/15/2014 1445   GFRNONAA 74* 01/15/2014 1445   GFRAA 85* 01/15/2014 1445    No results found for this basename: LIPASE, AMYLASE,  in the last 168 hours No results found for this basename: AMMONIA,  in the last 168 hours   Recent Labs Lab 01/15/14 1445  TROPONINI <0.30   BNP (last 3 results) No results found for this basename:  PROBNP,  in the last 8760 hours  Radiological Exams on Admission: Dg Lumbar Spine Complete  01/15/2014   CLINICAL DATA:  Low back pain, continuous falls  EXAM: LUMBAR SPINE - COMPLETE 4+ VIEW  COMPARISON:  None.  FINDINGS: There are 5 non rib-bearing lumbar type vertebral bodies.  There is a mild scoliotic curvature of the thoracolumbar spine with dominant inferior curvature convex to the left. There is mild straightening the expected lumbar lordosis. No anterolisthesis or retrolisthesis.  Lumbar vertebral body heights are preserved.  There is mild-to-moderate multilevel lumbar spine DDD, worse at T11-T12, L1-L2 and L5-S1 with disc space height loss, endplate  irregularity and sclerosis.  Limited visualization the bilateral SI joints is normal.  Regional soft tissues appear normal.  IMPRESSION: 1. No acute findings. 2. Mild-to-moderate multilevel lumbar spine DDD.   Electronically Signed   By: Sandi Mariscal M.D.   On: 01/15/2014 15:26   Ct Head Wo Contrast  01/15/2014   CLINICAL DATA:  Post fall, now with injury in weakness  EXAM: CT HEAD WITHOUT CONTRAST  TECHNIQUE: Contiguous axial images were obtained from the base of the skull through the vertex without intravenous contrast.  COMPARISON:  None.  FINDINGS: There is an old lacunar infarct within the left the coronal radiata (image 19, series 2). Scattered minimal periventricular hypodensities compatible with microvascular ischemic disease. The gray-white differentiation is otherwise well maintained without CT evidence of acute large territory infarct. No intraparenchymal or extra-axial mass or hemorrhage. Normal size and configuration of the ventricles and basilar cisterns. No midline shift. Limited visualization of the paranasal sinuses and mastoid air cells are normal. No air-fluid levels. Intracranial atherosclerosis. Regional soft tissues appear normal. No displaced calvarial fracture.  IMPRESSION: Microvascular ischemic disease without acute intracranial  process.   Electronically Signed   By: Sandi Mariscal M.D.   On: 01/15/2014 15:28   EKG: Independently reviewed. normal sinus rhythm, nonspecific ST and T waves changes.  Assessment/Plan Principal Problem:   TIA (transient ischemic attack) Active Problems:   Essential hypertension, benign   Bradycardia   chronic Right sided weakness   GERD (gastroesophageal reflux disease)   Neuropathy   1. TIA (transient ischemic attack) Patient presents with complaints of multiple fall. He has chronic right-sided weakness and chronic mild degree of slurred speech. At present he does not have further progression of his symptoms. Telemetry and EKG are normal. Initial CT of the head does not show any abnormality. Neurologic has been evaluated the patient and recommend further workup including MRI echocardiogram and continue with aspirin 325. We will monitor him on telemetry and observation for this neuro checks. Further workup depending on the results of the MRI.  2. Diabetes mellitus. 419 abdomen is a dose of 30 units and may resume 50 units twice a day from tomorrow. Also holding his regular short-acting insulin and placing him on a sliding scale. Diabetic diet after patient passes stroke evaluation.  3. Hypertension. Continue home antihypertensive medications from tomorrow.  4. Mood disorder. Continue with Seroquel Zoloft  5. Peripheral neuropathy Continue with gabapentin  6. Pain on his back. X-ray of the lumbar spine negative for any acute fracture at present we'll give him K apd and Norco.   Consults: Neurology  DVT Prophylaxis: subcutaneous Heparin Nutrition: N.p.o. until stroke evaluation  Code Status: Full  Disposition: Admitted to observation in telemetry unit.  Author: Berle Mull, MD [facility] Pager: [phone] 01/15/2014, 9:58 PM    If 7PM-7AM, please contact night-coverage [email or web address] Password [email or web address]

## 2014-01-16 ENCOUNTER — Observation Stay (HOSPITAL_COMMUNITY): Payer: Medicare Other

## 2014-01-16 DIAGNOSIS — I6350 Cerebral infarction due to unspecified occlusion or stenosis of unspecified cerebral artery: Principal | ICD-10-CM

## 2014-01-16 DIAGNOSIS — I517 Cardiomegaly: Secondary | ICD-10-CM

## 2014-01-16 LAB — GLUCOSE, CAPILLARY
GLUCOSE-CAPILLARY: 224 mg/dL — AB (ref 70–99)
GLUCOSE-CAPILLARY: 312 mg/dL — AB (ref 70–99)
Glucose-Capillary: 366 mg/dL — ABNORMAL HIGH (ref 70–99)
Glucose-Capillary: 228 mg/dL — ABNORMAL HIGH (ref 70–99)
Glucose-Capillary: 121 mg/dL — ABNORMAL HIGH (ref 70–99)

## 2014-01-16 LAB — RAPID URINE DRUG SCREEN, HOSP PERFORMED
Amphetamines: NOT DETECTED
BARBITURATES: NOT DETECTED
Benzodiazepines: NOT DETECTED
Cocaine: NOT DETECTED
Opiates: NOT DETECTED
TETRAHYDROCANNABINOL: NOT DETECTED

## 2014-01-16 LAB — URINALYSIS, ROUTINE W REFLEX MICROSCOPIC
Bilirubin Urine: NEGATIVE
Glucose, UA: >1000 mg/dL — AB
Hgb urine dipstick: NEGATIVE
Ketones, ur: NEGATIVE mg/dL
LEUKOCYTES UA: NEGATIVE
NITRITE: NEGATIVE
PH: 5.5 (ref 5.0–8.0)
Protein, ur: NEGATIVE mg/dL
SPECIFIC GRAVITY, URINE: 1.025 (ref 1.005–1.030)
Urobilinogen, UA: 1 mg/dL (ref 0.0–1.0)

## 2014-01-16 LAB — LIPID PANEL
CHOLESTEROL: 164 mg/dL (ref 0–200)
HDL: 40 mg/dL (ref >39)
LDL Cholesterol: 74 mg/dL (ref 0–99)
Total CHOL/HDL Ratio: 4.1 RATIO
Triglycerides: 248 mg/dL — ABNORMAL HIGH (ref <150)
VLDL: 50 mg/dL — ABNORMAL HIGH (ref 0–40)

## 2014-01-16 LAB — URINE MICROSCOPIC-ADD ON

## 2014-01-16 LAB — HEMOGLOBIN A1C
HEMOGLOBIN A1C: 10.6 % — AB (ref <5.7)
MEAN PLASMA GLUCOSE: 258 mg/dL — AB (ref <117)

## 2014-01-16 MED ORDER — INSULIN ASPART 100 UNIT/ML ~~LOC~~ SOLN
12.0000 [IU] | Freq: Three times a day (TID) | SUBCUTANEOUS | Status: DC
  Administered 2014-01-16 – 2014-01-17 (×4): 12 [IU] via SUBCUTANEOUS

## 2014-01-16 MED ORDER — FENOFIBRATE 160 MG PO TABS
160.0000 mg | ORAL_TABLET | Freq: Every day | ORAL | Status: DC
  Administered 2014-01-16 – 2014-01-18 (×3): 160 mg via ORAL
  Filled 2014-01-16 (×3): qty 1

## 2014-01-16 MED ORDER — ATORVASTATIN CALCIUM 10 MG PO TABS
20.0000 mg | ORAL_TABLET | Freq: Every day | ORAL | Status: DC
  Administered 2014-01-16 – 2014-01-17 (×2): 20 mg via ORAL
  Filled 2014-01-16 (×3): qty 2

## 2014-01-16 MED ORDER — INSULIN GLARGINE 100 UNIT/ML ~~LOC~~ SOLN
40.0000 [IU] | Freq: Two times a day (BID) | SUBCUTANEOUS | Status: DC
  Administered 2014-01-16 – 2014-01-17 (×3): 40 [IU] via SUBCUTANEOUS
  Filled 2014-01-16 (×4): qty 0.4

## 2014-01-16 NOTE — Progress Notes (Signed)
VASCULAR LAB PRELIMINARY  PRELIMINARY  PRELIMINARY  PRELIMINARY  Carotid duplex  completed.    Preliminary report:  Bilateral:  1-39% ICA stenosis.  Vertebral artery flow is antegrade.      CESTONE, HELENE, RVT 01/16/2014, 3:11 PM

## 2014-01-16 NOTE — Progress Notes (Signed)
  Echocardiogram 2D Echocardiogram has been performed.  Diamond Nickel 01/16/2014, 3:25 PM

## 2014-01-16 NOTE — Evaluation (Signed)
Physical Therapy Evaluation Patient Details Name: Maurice Stone MRN: [id number] DOB: 09-Feb-1941 Today's Date: 01/16/2014   History of Present Illness  Maurice Stone is an 73 y.o. male who reports that he has been falling for the past 6 years. It has become more and more frequent and became a real issue this weekend. He reports that he felt as if his right leg was weak. Patient was seen by another facility at that time and discharged to home. Patient's family is in the room tonight. They report that visit was on Friday and they did not note a facial droop at that time. He was seen again on Sunday and no facial droop was noted. When his daughter talked to him on the phone yesterday she felt that his speech was slurred. Today when sen by family he was felt to have a facial droop and was taken again to the ED. On this evaluation he was felt to have right sided weakness and a possible stroke.  Clinical Impression  Pt presenting with R hemiparesis and significant falls risk. Pt with freq falls a day due to R LE buckling and giving way. Pt to benefit from CIR to address deficits and achieve safe mod I function for safe transition home.    Follow Up Recommendations CIR;Supervision/Assistance - 24 hour    Equipment Recommendations  None recommended by PT    Recommendations for Other Services Rehab consult     Precautions / Restrictions Precautions Precautions: Fall Restrictions Weight Bearing Restrictions: No      Mobility  Bed Mobility Overal bed mobility: Needs Assistance Bed Mobility: Supine to Sit     Supine to sit: Mod assist     General bed mobility comments: assist for R LE management and trunk elevation  Transfers Overall transfer level: Needs assistance Equipment used: Rolling walker (2 wheeled) Transfers: Sit to/from [facility] Sit to Stand: Min assist Stand pivot transfers: Mod assist       General transfer comment: pt able to come to standing  with minimal assist but when asked to marching in place pt required maxA to maintain balance due to R LE buckling during [place]. Pt required modA for for stand pvt due to R knee buckling, support provided at pelvis and R knee to prevent pt from falling  Ambulation/Gait Ambulation/Gait assistance:  (unsafe at this time)              Financial trader Rankin (Stroke Patients Only) Modified Rankin (Stroke Patients Only) Pre-Morbid Rankin Score: Slight disability Modified Rankin: Severe disability     Balance Overall balance assessment: Needs assistance Sitting-balance support: Feet supported;No upper extremity supported Sitting balance-Leahy Scale: Fair Sitting balance - Comments: pt with posterior lean with dynamic activity, esp LE LAQ   Standing balance support: Bilateral upper extremity supported Standing balance-Leahy Scale: Poor Standing balance comment: pt requires RW to maintain static balance however unable to maintain dynamic balance due to R knee buckling                             Pertinent Vitals/Pain Denies pain    Home Living Family/patient expects to be discharged to:: Unsure                 Additional Comments: pt lives alone and only have family avail PRN. Pt to benefit from CIR vs  SNF.    Prior Function Level of Independence: Needs assistance   Gait / Transfers Assistance Needed: pt was amb safely with RW until recently, now he reports multiple falls a day  ADL's / Homemaking Assistance Needed: was indep until recently        Hand Dominance   Dominant Hand: Right    Extremity/Trunk Assessment   Upper Extremity Assessment: RUE deficits/detail RUE Deficits / Details: shld flex 3-/5, bicep 3+/5, tricep 3-/5, grip 3-/5   RUE Sensation:  (denies numbness)     Lower Extremity Assessment: RLE deficits/detail RLE Deficits / Details: grossly 3-/5, R knee buckling during WBing    Cervical /  Trunk Assessment: Normal  Communication   Communication: No difficulties  Cognition Arousal/Alertness: Awake/alert Behavior During Therapy: WFL for tasks assessed/performed Overall Cognitive Status: Within Functional Limits for tasks assessed                      General Comments      Exercises        Assessment/Plan    PT Assessment Patient needs continued PT services  PT Diagnosis Difficulty walking   PT Problem List Decreased strength;Decreased balance;Decreased activity tolerance;Decreased range of motion;Decreased mobility  PT Treatment Interventions DME instruction;Gait training;Functional mobility training;Therapeutic activities;Therapeutic exercise;Balance training;Neuromuscular re-education   PT Goals (Current goals can be found in the Care Plan section) Acute Rehab PT Goals Patient Stated Goal: to stop falling PT Goal Formulation: With patient Time For Goal Achievement: 01/30/14 Potential to Achieve Goals: Good    Frequency Min 4X/week   Barriers to discharge Decreased caregiver support pt lives alone    Co-evaluation               End of Session Equipment Utilized During Treatment: Gait belt Activity Tolerance: Patient tolerated treatment well Patient left: in chair;with call bell/phone within reach;with chair alarm set Nurse Communication: Mobility status    Functional Assessment Tool Used: clinical judgment Functional Limitation: Mobility: Walking and moving around Mobility: Walking and Moving Around Current Status [phone]): At least 60 percent but less than 80 percent impaired, limited or restricted Mobility: Walking and Moving Around Goal Status [phone]): At least 20 percent but less than 40 percent impaired, limited or restricted    Time: 0738-0807 PT Time Calculation (min): 29 min   Charges:   PT Evaluation $Initial PT Evaluation Tier I: 1 Procedure PT Treatments $Therapeutic Activity: 8-22 mins   PT G Codes:   Functional  Assessment Tool Used: clinical judgment Functional Limitation: Mobility: Walking and moving around    [place], [facility] 01/16/2014, 9:10 AM  Kittie Plater, PT, DPT Pager #: [phone] Office #: [phone]

## 2014-01-16 NOTE — Progress Notes (Signed)
Patient Demographics  Maurice Stone, is a 73 y.o. male, DOB - October 21, 1940, PPI:[id number]  Admit date - 01/15/2014   Admitting Physician Thurnell Lose, MD  Outpatient Primary MD for the patient is No primary provider on file.  LOS - 1   Chief Complaint  Patient presents with  . Fall           Subjective:   [name] today has, No headache, No chest pain, No abdominal pain - No Nausea, No new weakness tingling or numbness, No Cough - SOB.  Still has right-sided weakness which is improving.    Assessment & Plan    1.CVA - with acute on chronic right-sided weakness, we'll continue full stroke workup with monitoring on  on tele, obtain PT, OT, speech input, A1c and lipid panel, check MRI MRA brain, echogram, carotid duplex, neurology following, aspirin antiplatelet therapy per neurology the   No results found for this basename: HGBA1C    Lab Results  Component Value Date   CHOL 164 01/16/2014   HDL 40 01/16/2014   LDLCALC 74 01/16/2014   TRIG 248* 01/16/2014   CHOLHDL 4.1 01/16/2014       2.DM2 - pending A1c,  on Lantus, scheduled NovoLog along with sliding scale monitor  CBG (last 3)   Recent Labs  01/15/14 2321 01/16/14 0603 01/16/14 0736  GLUCAP 366* 224* 228*       3.HTN - continue present regimen which includes Norvasc, Cozaar and beta blocker.    4. Dyslipidemia. Increased statin dose and added TriCor for better control.    5. GERD on PPI continue.    6. Depression anxiety. On Zoloft Seroquel which will be continued.    7. Diabetic neuropathy. Supportive care.    8. Fall with low back pain. We'll check MRI of the spine as well.      Code Status: Full  Family Communication:    Disposition Plan: SNF   Procedures  CT head, MRI/MRA  brain, MRI L-spine, echogram, carotid duplex   Consults  Neuro   Medications  Scheduled Meds: . amLODipine  10 mg Oral Daily  . aspirin  325 mg Oral Daily  . buPROPion  100 mg Oral BID  . donepezil  10 mg Oral QHS  . gabapentin  600 mg Oral TID  . heparin  5,000 Units Subcutaneous 3 times per day  . insulin aspart  0-15 Units Subcutaneous TID WC  . insulin aspart  0-5 Units Subcutaneous QHS  . insulin aspart  15 Units Subcutaneous TID WC  . insulin glargine  30 Units Subcutaneous BID  . loratadine  10 mg Oral Daily  . losartan  100 mg Oral Daily  . metoprolol  50 mg Oral BID  . pantoprazole  40 mg Oral Daily  . QUEtiapine  200 mg Oral QHS  . sertraline  200 mg Oral Daily  . simvastatin  10 mg Oral q1800  . simvastatin  40 mg Oral q1800  . sulindac  150 mg Oral BID WC   Continuous Infusions:  PRN Meds:.albuterol, HYDROcodone-acetaminophen  DVT Prophylaxis   Heparin    Lab Results  Component Value Date   PLT 144* 01/15/2014    Antibiotics     Anti-infectives  None          Objective:   Filed Vitals:   01/16/14 0600 01/16/14 0653 01/16/14 0746 01/16/14 0809  BP:  125/68  128/67  Pulse:  44 53 49  Temp:  97.9 F (36.6 C)    TempSrc:  Oral    Resp: 20 18 20    SpO2:  94% 93% 93%    Wt Readings from Last 3 Encounters:  No data found for Wt     Intake/Output Summary (Last 24 hours) at 01/16/14 0951 Last data filed at 01/16/14 0123  Gross per 24 hour  Intake      0 ml  Output    300 ml  Net   -300 ml     Physical Exam  Awake Alert, Oriented X 3, No new F.N deficits, Normal affect, L side weakes than R [place].AT,PERRAL Supple Neck,No JVD, No cervical lymphadenopathy appriciated.  Symmetrical Chest wall movement, Good air movement bilaterally, CTAB RRR,No Gallops,Rubs or new Murmurs, No Parasternal Heave +ve B.Sounds, Abd Soft, No tenderness, No organomegaly appriciated, No rebound - guarding or rigidity. No Cyanosis, Clubbing or edema, No new Rash  or bruise      Data Review   Micro Results No results found for this or any previous visit (from the past 240 hour(s)).  Radiology Reports Dg Lumbar Spine Complete  01/15/2014   CLINICAL DATA:  Low back pain, continuous falls  EXAM: LUMBAR SPINE - COMPLETE 4+ VIEW  COMPARISON:  None.  FINDINGS: There are 5 non rib-bearing lumbar type vertebral bodies.  There is a mild scoliotic curvature of the thoracolumbar spine with dominant inferior curvature convex to the left. There is mild straightening the expected lumbar lordosis. No anterolisthesis or retrolisthesis.  Lumbar vertebral body heights are preserved.  There is mild-to-moderate multilevel lumbar spine DDD, worse at T11-T12, L1-L2 and L5-S1 with disc space height loss, endplate irregularity and sclerosis.  Limited visualization the bilateral SI joints is normal.  Regional soft tissues appear normal.  IMPRESSION: 1. No acute findings. 2. Mild-to-moderate multilevel lumbar spine DDD.   Electronically Signed   By: Sandi Mariscal M.D.   On: 01/15/2014 15:26   Ct Head Wo Contrast  01/15/2014   CLINICAL DATA:  Post fall, now with injury in weakness  EXAM: CT HEAD WITHOUT CONTRAST  TECHNIQUE: Contiguous axial images were obtained from the base of the skull through the vertex without intravenous contrast.  COMPARISON:  None.  FINDINGS: There is an old lacunar infarct within the left the coronal radiata (image 19, series 2). Scattered minimal periventricular hypodensities compatible with microvascular ischemic disease. The gray-white differentiation is otherwise well maintained without CT evidence of acute large territory infarct. No intraparenchymal or extra-axial mass or hemorrhage. Normal size and configuration of the ventricles and basilar cisterns. No midline shift. Limited visualization of the paranasal sinuses and mastoid air cells are normal. No air-fluid levels. Intracranial atherosclerosis. Regional soft tissues appear normal. No displaced calvarial  fracture.  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   Electronically Signed   By: Sandi Mariscal M.D.   On: 01/15/2014 15:28    CBC  Recent Labs Lab 01/15/14 1445  WBC 9.9  HGB 14.7  HCT 42.6  PLT 144*  MCV 89.3  MCH 30.8  MCHC 34.5  RDW 13.7  LYMPHSABS 3.3  MONOABS 0.9  EOSABS 0.2  BASOSABS 0.0    Chemistries   Recent Labs Lab 01/15/14 1445  NA 140  K 3.8  CL 101  CO2 25  GLUCOSE 218*  BUN 17  CREATININE 1.00  CALCIUM 9.6  AST 43*  ALT 36  ALKPHOS 117  BILITOT 0.7   ------------------------------------------------------------------------------------------------------------------ CrCl is unknown because there is no height on file for the current visit. ------------------------------------------------------------------------------------------------------------------ No results found for this basename: HGBA1C,  in the last 72 hours ------------------------------------------------------------------------------------------------------------------  Recent Labs  01/16/14 0605  CHOL 164  HDL 40  LDLCALC 74  TRIG 248*  CHOLHDL 4.1   ------------------------------------------------------------------------------------------------------------------ No results found for this basename: TSH, T4TOTAL, FREET3, T3FREE, THYROIDAB,  in the last 72 hours ------------------------------------------------------------------------------------------------------------------ No results found for this basename: VITAMINB12, FOLATE, FERRITIN, TIBC, IRON, RETICCTPCT,  in the last 72 hours  Coagulation profile  Recent Labs Lab 01/15/14 1445  INR 1.10    No results found for this basename: DDIMER,  in the last 72 hours  Cardiac Enzymes  Recent Labs Lab 01/15/14 1445  TROPONINI <0.30   ------------------------------------------------------------------------------------------------------------------ No components found with this basename:  POCBNP,      Time Spent in minutes 35   Maude Gloor K M.D on 01/16/2014 at 9:51 AM  Between 7am to 7pm - Pager - [phone]  After 7pm go to [email or web address] - password [email or web address]  And look for the night coverage person covering for me after hours  [facility] Office  [phone]   **Disclaimer: This note may have been dictated with voice recognition software. Similar sounding words can inadvertently be transcribed and this note may contain transcription errors which may not have been corrected upon publication of note.**

## 2014-01-16 NOTE — Progress Notes (Signed)
Rehab Admissions Coordinator Note:  Patient was screened by Cleatrice Burke for appropriateness for an Inpatient Acute Rehab Consult per PT recommendation. At this time, we are recommending would await further medical workup and diagnosis before determining rehab venue options. I will follow up.Cleatrice Burke 01/16/2014, 9:50 AM  I can be reached at [phone].

## 2014-01-16 NOTE — Progress Notes (Signed)
Stroke Team Progress Note  HISTORY Chief Complaint: Right sided weakness, falls  HPI: Maurice Stone is an 73 y.o. male who reports that he has been falling for the past 6 years. It has become more and more frequent and became a real issue this weekend. He reports that he felt as if his right leg was weak. Patient was seen by another facility at that time and discharged to home. Patient's family is in the room tonight. They report that visit was on Friday and they did not note a facial droop at that time. He was seen again on Sunday and no facial droop was noted. When his daughter talked to him on the phone yesterday she felt that his speech was slurred. Today when seen by family he was felt to have a facial droop and was taken again to the ED. On this evaluation he was felt to have right sided weakness and a possible stroke. Patient was transferred here for further evaluation.  Date last known well: Unable to determine  Time last known well: Unable to determine  tPA Given: No: Unable to determine LKW   Patient was not administered TPA secondary to Unable to determine LKW. He was admitted to the Internal Medicine Service for further evaluation and treatment. Neurology is consulting.   SUBJECTIVE  Friend is in the room with the patient. The patient is alert and cooperative, no complaints of headache or visual disturbance.  OBJECTIVE Most recent Vital Signs: Filed Vitals:   01/16/14 0535 01/16/14 0653 01/16/14 0746 01/16/14 0809  BP: 121/73 125/68  128/67  Pulse: 46 44 53 49  Temp: 98.4 F (36.9 C) 97.9 F (36.6 C)    TempSrc: Oral Oral    Resp: 18 18 20    SpO2: 98% 94% 93% 93%   CBG (last 3)   Recent Labs  01/15/14 2321 01/16/14 0603 01/16/14 0736  GLUCAP 366* 224* 228*    IV Fluid Intake:     MEDICATIONS  . amLODipine  10 mg Oral Daily  . aspirin  325 mg Oral Daily  . buPROPion  100 mg Oral BID  . donepezil  10 mg Oral QHS  . gabapentin  600 mg Oral TID  . heparin   5,000 Units Subcutaneous 3 times per day  . insulin aspart  0-15 Units Subcutaneous TID WC  . insulin aspart  0-5 Units Subcutaneous QHS  . insulin aspart  15 Units Subcutaneous TID WC  . insulin glargine  30 Units Subcutaneous BID  . loratadine  10 mg Oral Daily  . losartan  100 mg Oral Daily  . metoprolol  50 mg Oral BID  . pantoprazole  40 mg Oral Daily  . QUEtiapine  200 mg Oral QHS  . sertraline  200 mg Oral Daily  . simvastatin  10 mg Oral q1800  . simvastatin  40 mg Oral q1800  . sulindac  150 mg Oral BID WC   PRN:  albuterol, HYDROcodone-acetaminophen  Diet:  NPO  Activity:   Up with assistance DVT Prophylaxis: SQ heparin  CLINICALLY SIGNIFICANT STUDIES Basic Metabolic Panel:  Recent Labs Lab 01/15/14 1445  NA 140  K 3.8  CL 101  CO2 25  GLUCOSE 218*  BUN 17  CREATININE 1.00  CALCIUM 9.6   Liver Function Tests:  Recent Labs Lab 01/15/14 1445  AST 43*  ALT 36  ALKPHOS 117  BILITOT 0.7  PROT 7.5  ALBUMIN 4.0   CBC:  Recent Labs Lab 01/15/14 1445  WBC  9.9  NEUTROABS 5.6  HGB 14.7  HCT 42.6  MCV 89.3  PLT 144*   Coagulation:  Recent Labs Lab 01/15/14 1445  LABPROT 14.0  INR 1.10   Cardiac Enzymes:  Recent Labs Lab 01/15/14 1445  TROPONINI <0.30   Urinalysis:  Recent Labs Lab 01/16/14 0134  COLORURINE YELLOW  LABSPEC 1.025  PHURINE 5.5  GLUCOSEU >1000*  HGBUR NEGATIVE  BILIRUBINUR NEGATIVE  KETONESUR NEGATIVE  PROTEINUR NEGATIVE  UROBILINOGEN 1.0  NITRITE NEGATIVE  LEUKOCYTESUR NEGATIVE   Lipid Panel    Component Value Date/Time   CHOL 164 01/16/2014 0605   TRIG 248* 01/16/2014 0605   HDL 40 01/16/2014 0605   CHOLHDL 4.1 01/16/2014 0605   VLDL 50* 01/16/2014 0605   LDLCALC 74 01/16/2014 0605   HgbA1C  No results found for this basename: HGBA1C    Urine Drug Screen:     Component Value Date/Time   LABOPIA NONE DETECTED 01/16/2014 0134   COCAINSCRNUR NONE DETECTED 01/16/2014 0134   LABBENZ NONE DETECTED 01/16/2014 0134    AMPHETMU NONE DETECTED 01/16/2014 0134   THCU NONE DETECTED 01/16/2014 0134   LABBARB NONE DETECTED 01/16/2014 0134    Alcohol Level:  Recent Labs Lab 01/15/14 1445  ETH <11    Dg Lumbar Spine Complete  01/15/2014   CLINICAL DATA:  Low back pain, continuous falls  EXAM: LUMBAR SPINE - COMPLETE 4+ VIEW  COMPARISON:  None.  FINDINGS: There are 5 non rib-bearing lumbar type vertebral bodies.  There is a mild scoliotic curvature of the thoracolumbar spine with dominant inferior curvature convex to the left. There is mild straightening the expected lumbar lordosis. No anterolisthesis or retrolisthesis.  Lumbar vertebral body heights are preserved.  There is mild-to-moderate multilevel lumbar spine DDD, worse at T11-T12, L1-L2 and L5-S1 with disc space height loss, endplate irregularity and sclerosis.  Limited visualization the bilateral SI joints is normal.  Regional soft tissues appear normal.  IMPRESSION: 1. No acute findings. 2. Mild-to-moderate multilevel lumbar spine DDD.   Electronically Signed   By: Sandi Mariscal M.D.   On: 01/15/2014 15:26   Ct Head Wo Contrast  01/15/2014   CLINICAL DATA:  Post fall, now with injury in weakness  EXAM: CT HEAD WITHOUT CONTRAST  TECHNIQUE: Contiguous axial images were obtained from the base of the skull through the vertex without intravenous contrast.  COMPARISON:  None.  FINDINGS: There is an old lacunar infarct within the left the coronal radiata (image 19, series 2). Scattered minimal periventricular hypodensities compatible with microvascular ischemic disease. The gray-white differentiation is otherwise well maintained without CT evidence of acute large territory infarct. No intraparenchymal or extra-axial mass or hemorrhage. Normal size and configuration of the ventricles and basilar cisterns. No midline shift. Limited visualization of the paranasal sinuses and mastoid air cells are normal. No air-fluid levels. Intracranial atherosclerosis. Regional soft tissues  appear normal. No displaced calvarial fracture.  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   Electronically Signed   By: Sandi Mariscal M.D.   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   MRI of the brain    MRA of the brain    2D Echocardiogram    Carotid Doppler    CXR    EKG    Sinus bradycardia Left anterior fascicular block Nonspecific T wave abnormality Abnormal ECG Since last tracing rate slower  Therapy Recommendations CIR  Physical Exam  General:  The patient is alert and cooperative at the time of the examination.  Skin: No significant peripheral edema is noted.   Neurologic Exam  Mental status: The patient is oriented x 3.  Cranial nerves: Facial symmetry is not present. Very minimal depression of the right nasolabial fold. Dysarthria is noted. Extraocular movements are full. Visual fields are full.  Motor: The patient has good strength in the left extremities. The patient has some clumsiness of the right arm, trace weakness of the right leg.  Sensory examination: Soft touch sensation is symmetric on the face, arms, and legs.  Coordination: The patient has good finger-nose-finger and heel-to-shin on the left, slight clumsiness with finger-nose-finger with the right arm.  Gait and station: The gait was not tested. No drift is seen with the upper extremities.  Reflexes: Deep tendon reflexes are symmetric, but are depressed.    ASSESSMENT Maurice Stone is a 73 y.o. male presenting with worsening weakness on the right side, dysarthria. The patient has a prior history of a left brain stroke in the past, with residual right-sided weakness. The patient lives alone, walks with a cane usually. The patient was on aspirin prior to admission. He was not a TPA candidate secondary to delay in presentation. The stroke deficits came on gradually over  several days.   Probable left brain stroke secondary to intrinsic atherosclerosis intracranially  Hypertension  Diabetes  Coronary artery disease  COPD  Prior history of left brain stroke  Dyslipidemia  Memory disturbance   Hospital day # 1  TREATMENT/PLAN  MRI brain  MRA head  2-D echocardiogram  Carotid Doppler study  Possible CIR  Continue aspirin for now, may consider switch to Plavix  SIGNED Lenor Coffin   I have personally obtained a history, examined the patient, evaluated imaging results, and formulated the assessment and plan of care. I agree with the above.    To contact Stroke Continuity provider, please refer to [email or web address] After hours, contact General Neurology

## 2014-01-16 NOTE — Progress Notes (Signed)
Utilization review completed.  

## 2014-01-17 ENCOUNTER — Encounter (HOSPITAL_COMMUNITY): Payer: Self-pay | Admitting: *Deleted

## 2014-01-17 DIAGNOSIS — I6330 Cerebral infarction due to thrombosis of unspecified cerebral artery: Secondary | ICD-10-CM

## 2014-01-17 DIAGNOSIS — I639 Cerebral infarction, unspecified: Secondary | ICD-10-CM | POA: Diagnosis present

## 2014-01-17 LAB — GLUCOSE, CAPILLARY
GLUCOSE-CAPILLARY: 218 mg/dL — AB (ref 70–99)
Glucose-Capillary: 128 mg/dL — ABNORMAL HIGH (ref 70–99)
Glucose-Capillary: 100 mg/dL — ABNORMAL HIGH (ref 70–99)
Glucose-Capillary: 308 mg/dL — ABNORMAL HIGH (ref 70–99)
Glucose-Capillary: 201 mg/dL — ABNORMAL HIGH (ref 70–99)

## 2014-01-17 MED ORDER — INSULIN GLARGINE 100 UNIT/ML ~~LOC~~ SOLN
45.0000 [IU] | Freq: Two times a day (BID) | SUBCUTANEOUS | Status: DC
  Administered 2014-01-17 – 2014-01-18 (×2): 45 [IU] via SUBCUTANEOUS
  Filled 2014-01-17 (×3): qty 0.45

## 2014-01-17 MED ORDER — CLOPIDOGREL BISULFATE 75 MG PO TABS
75.0000 mg | ORAL_TABLET | Freq: Every day | ORAL | Status: DC
  Administered 2014-01-18: 75 mg via ORAL
  Filled 2014-01-17: qty 1

## 2014-01-17 MED ORDER — INSULIN ASPART 100 UNIT/ML ~~LOC~~ SOLN
15.0000 [IU] | Freq: Three times a day (TID) | SUBCUTANEOUS | Status: DC
  Administered 2014-01-17 – 2014-01-18 (×3): 15 [IU] via SUBCUTANEOUS

## 2014-01-17 NOTE — Consult Note (Signed)
Physical Medicine and Rehabilitation Consult Reason for Consult:CVA Referring Physician: [facility]   HPI: Maurice Stone is a 73 y.o. right handed male with history of hypertension, diabetes mellitus, CAD,COPD as well as CVA 6 years ago with some residual slurred speech maintained on aspirin. Patient lives alone independent prior to admission. Presented 01/15/2014 with frequent falls and right-sided weakness and slurred speech. MRI shows acute nonhemorrhagic left paramedian pontine infarct as well as remote infarct left basal ganglia. MRA of the head without stenosis or occlusion. Echocardiogram with ejection fraction 10% grade 1 diastolic dysfunction. Carotid Dopplers no ICA stenosis. Patient did not receive TPA. Neurology services consulted aspirin increased to 325 mg daily as well as the addition of subcutaneous heparin for DVT prophylaxis. Hemoglobin A1c of 10.6 with insulin therapy as directed. Patient is tolerating a regular consistency diet. Physical therapy evaluation completed 01/16/2014 with recommendations of physical medicine rehabilitation consult   Review of Systems  Gastrointestinal: Positive for constipation.  Musculoskeletal: Positive for falls.  Neurological: Positive for speech change and weakness.  Psychiatric/Behavioral: Positive for memory loss.  All other systems reviewed and are negative.  Past Medical History  Diagnosis Date  . Hypertension   . Diabetes mellitus without complication   . Coronary artery disease   . COPD (chronic obstructive pulmonary disease)   . Stroke    History reviewed. No pertinent past surgical history. History reviewed. No pertinent family history. Social History:  reports that he quit smoking about 41 years ago. His smoking use included Cigarettes. He smoked 0.00 packs per day for 10 years. He does not have any smokeless tobacco history on file. He reports that he drinks about .6 ounces of alcohol per week. He reports that he does not  use illicit drugs. Allergies:  Allergies  Allergen Reactions  . Lisinopril Cough   Medications Prior to Admission  Medication Sig Dispense Refill  . albuterol (PROVENTIL HFA;VENTOLIN HFA) 108 (90 BASE) MCG/ACT inhaler Inhale into the lungs every 6 (six) hours as needed for wheezing or shortness of breath.      Marland Kitchen amLODipine (NORVASC) 10 MG tablet Take 10 mg by mouth daily.      Marland Kitchen aspirin 81 MG tablet Take 81 mg by mouth daily.      Marland Kitchen buPROPion (WELLBUTRIN) 100 MG tablet Take 100 mg by mouth 2 (two) times daily.      . carboxymethylcellulose (REFRESH PLUS) 0.5 % SOLN 1 drop 3 (three) times daily as needed.      . Cholecalciferol 2000 UNITS CAPS Take 4,000 Units by mouth daily.      Marland Kitchen donepezil (ARICEPT) 10 MG tablet Take 10 mg by mouth at bedtime.      . gabapentin (NEURONTIN) 600 MG tablet Take 600 mg by mouth 3 (three) times daily.      Marland Kitchen HYDROcodone-acetaminophen (NORCO/VICODIN) 5-325 MG per tablet Take 1 tablet by mouth every 6 (six) hours as needed for moderate pain.      Marland Kitchen insulin aspart (NOVOLOG) 100 UNIT/ML injection Inject 15 Units into the skin 3 (three) times daily with meals.      . insulin glargine (LANTUS) 100 UNIT/ML injection Inject 50 Units into the skin 2 (two) times daily.      Marland Kitchen loratadine (CLARITIN) 10 MG tablet Take 10 mg by mouth daily.      Marland Kitchen losartan (COZAAR) 100 MG tablet Take 100 mg by mouth daily.      . metoprolol (LOPRESSOR) 50 MG tablet Take 50 mg  by mouth 2 (two) times daily.      . pantoprazole (PROTONIX) 40 MG tablet Take 40 mg by mouth daily.      . pravastatin (PRAVACHOL) 20 MG tablet Take 20 mg by mouth daily.      . QUEtiapine (SEROQUEL) 200 MG tablet Take 200 mg by mouth at bedtime.      . sertraline (ZOLOFT) 100 MG tablet Take 200 mg by mouth daily.      . sulindac (CLINORIL) 150 MG tablet Take 150 mg by mouth 2 (two) times daily.        Home: Home Living Family/patient expects to be discharged to:: Private residence Living Arrangements:  Alone Additional Comments: pt lives alone and only have family avail PRN. Pt to benefit from CIR vs SNF.  Functional History: Prior Function Level of Independence: Needs assistance Gait / Transfers Assistance Needed: pt was amb safely with RW until recently, now he reports multiple falls a day ADL's / Homemaking Assistance Needed: was indep until recently Functional Status:  Mobility: Bed Mobility Overal bed mobility: Needs Assistance Bed Mobility: Supine to Sit Supine to sit: Mod assist General bed mobility comments: assist for R LE management and trunk elevation Transfers Overall transfer level: Needs assistance Equipment used: Rolling walker (2 wheeled) Transfers: Sit to/from [facility] Sit to Stand: Min assist Stand pivot transfers: Mod assist General transfer comment: pt able to come to standing with minimal assist but when asked to marching in place pt required maxA to maintain balance due to R LE buckling during [place]. Pt required modA for for stand pvt due to R knee buckling, support provided at pelvis and R knee to prevent pt from falling Ambulation/Gait Ambulation/Gait assistance:  (unsafe at this time)    ADL:    Cognition: Cognition Overall Cognitive Status: Within Functional Limits for tasks assessed Orientation Level: Oriented X4 Cognition Arousal/Alertness: Awake/alert Behavior During Therapy: WFL for tasks assessed/performed Overall Cognitive Status: Within Functional Limits for tasks assessed  Blood pressure 108/65, pulse 43, temperature 98.1 F (36.7 C), temperature source Oral, resp. rate 20, height 5\' 10"  (1.778 m), weight 89.585 kg (197 lb 8 oz), SpO2 98.00%. Physical Exam  Constitutional: He is oriented to person, place, and time.  HENT:  Head: Normocephalic.  Eyes: EOM are normal.  Neck: Normal range of motion. Neck supple. No thyromegaly present.  Cardiovascular: Normal rate and regular rhythm.   Respiratory: Effort normal and  breath sounds normal. No respiratory distress.  GI: Soft. Bowel sounds are normal. He exhibits no distension.  Neurological: He is alert and oriented to person, place, and time.  Mildly dysarthric but fully intelligible. He follows full commands. RUE 4/5 prox to distal. RLE 3+ HF, KE, 4/5 ankle. Mild right facial weakness and tongue deviation. No gross sensory deficits. +PN RUE. Normal insight and awareness.   Skin: Skin is warm and dry.    Results for orders placed during the hospital encounter of 01/15/14 (from the past 24 hour(s))  GLUCOSE, CAPILLARY     Status: Abnormal   Collection Time    01/16/14 11:11 AM      Result Value Ref Range   Glucose-Capillary 312 (*) 70 - 99 mg/dL   Comment 1 Documented in Chart    GLUCOSE, CAPILLARY     Status: Abnormal   Collection Time    01/16/14  6:36 PM      Result Value Ref Range   Glucose-Capillary 121 (*) 70 - 99 mg/dL  GLUCOSE, CAPILLARY  Status: Abnormal   Collection Time    01/16/14 10:50 PM      Result Value Ref Range   Glucose-Capillary 128 (*) 70 - 99 mg/dL  GLUCOSE, CAPILLARY     Status: Abnormal   Collection Time    01/17/14  6:46 AM      Result Value Ref Range   Glucose-Capillary 100 (*) 70 - 99 mg/dL   Dg Lumbar Spine Complete  01/15/2014   CLINICAL DATA:  Low back pain, continuous falls  EXAM: LUMBAR SPINE - COMPLETE 4+ VIEW  COMPARISON:  None.  FINDINGS: There are 5 non rib-bearing lumbar type vertebral bodies.  There is a mild scoliotic curvature of the thoracolumbar spine with dominant inferior curvature convex to the left. There is mild straightening the expected lumbar lordosis. No anterolisthesis or retrolisthesis.  Lumbar vertebral body heights are preserved.  There is mild-to-moderate multilevel lumbar spine DDD, worse at T11-T12, L1-L2 and L5-S1 with disc space height loss, endplate irregularity and sclerosis.  Limited visualization the bilateral SI joints is normal.  Regional soft tissues appear normal.  IMPRESSION: 1.  No acute findings. 2. Mild-to-moderate multilevel lumbar spine DDD.   Electronically Signed   By: Sandi Mariscal M.D.   On: 01/15/2014 15:26   Ct Head Wo Contrast  01/15/2014   CLINICAL DATA:  Post fall, now with injury in weakness  EXAM: CT HEAD WITHOUT CONTRAST  TECHNIQUE: Contiguous axial images were obtained from the base of the skull through the vertex without intravenous contrast.  COMPARISON:  None.  FINDINGS: There is an old lacunar infarct within the left the coronal radiata (image 19, series 2). Scattered minimal periventricular hypodensities compatible with microvascular ischemic disease. The gray-white differentiation is otherwise well maintained without CT evidence of acute large territory infarct. No intraparenchymal or extra-axial mass or hemorrhage. Normal size and configuration of the ventricles and basilar cisterns. No midline shift. Limited visualization of the paranasal sinuses and mastoid air cells are normal. No air-fluid levels. Intracranial atherosclerosis. Regional soft tissues appear normal. No displaced calvarial fracture.  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   Electronically Signed   By: Sandi Mariscal M.D.   On: 01/15/2014 15:28   Mri Brain Without Contrast  01/16/2014   CLINICAL DATA:  Increasing frequency of falls. Right leg weakness. Slurred speech and facial drooping. TIAs.  EXAM: MRI HEAD WITHOUT CONTRAST  MRA HEAD WITHOUT CONTRAST  TECHNIQUE: Multiplanar, multiecho pulse sequences of the brain and surrounding structures were obtained without intravenous contrast. Angiographic images of the head were obtained using MRA technique without contrast.  COMPARISON:  CT head without contrast 01/15/2014. MRI brain 06/06/2000.  FINDINGS: MRI HEAD FINDINGS  Acute nonhemorrhagic left paramedian pontine infarct is evident. Associated T2 and FLAIR signal hyperintensity is associated. A remote infarct of the left basal ganglia and corona radiata is again  noted. Mild to moderate periventricular and subcortical white matter changes are noted bilaterally.  Flow is present in the major intracranial arteries. The globes and orbits are intact. The paranasal sinuses and mastoid air cells are clear.  MRA HEAD FINDINGS  Mild atherosclerotic irregularity is present within the cavernous carotid arteries bilaterally without significant stenosis. The A1 and M1 segments are normal. The anterior communicating artery is patent. Posterior communicating arteries are present bilaterally. The MCA bifurcations are intact. There is some attenuation of MCA branch vessels bilaterally.  The left vertebral artery is dominant. The PICA vessels are not seen. Dominant AICA vessels are noted bilaterally. The basilar artery  demonstrates mild atherosclerotic change without significant stenosis. Both posterior cerebral arteries originate from the basilar tip. There is a moderate stenosis of the proximal right posterior cerebral artery. There is some attenuation of PCA branch vessels bilaterally.  IMPRESSION: 1. Acute nonhemorrhagic left paramedian pontine infarct. 2. Remote infarct of the left basal ganglia and coronal radiata. 3. Mild to moderate periventricular and subcortical white matter disease bilaterally likely reflects the sequela of chronic microvascular ischemia. 4. Atherosclerotic irregularities within the cavernous carotid arteries bilaterally as well as the basilar artery without a significant stenosis relative to the distal vessels. 5. Moderate stenosis of the proximal right P1 segment. These results were called by telephone at the time of interpretation on 01/16/2014 at 5:54 PM to Dr. Candiss Norse, who verbally acknowledged these results.   Electronically Signed   By: Lawrence Santiago M.D.   On: 01/16/2014 17:54   Mr Lumbar Spine Wo Contrast  01/16/2014   CLINICAL DATA:  Fall. Low back pain. Left lower extremity weakness.  EXAM: MRI LUMBAR SPINE WITHOUT CONTRAST  TECHNIQUE: Multiplanar,  multisequence MR imaging of the lumbar spine was performed. No intravenous contrast was administered.  COMPARISON:  Lumbar spine radiographs 01/15/2014.  FINDINGS: Normal signal is present in the conus medullaris which terminates at T12, within normal limits. Slight retrolisthesis at L1-2 is degenerative. Alignment is otherwise anatomic. Marrow signal, vertebral body heights, and alignment are normal without evidence for acute fracture. A 16 mm cystic lesion of the right kidney is incompletely imaged. A 13 mm parenchymal cyst is noted at the lower pole of the left kidney. Limited imaging of the abdomen is otherwise unremarkable.  L1-2: Lateral disc protrusions are present bilaterally. Mild lateral recess narrowing is evident. The foramina are patent.  L2-3: A far left lateral disc protrusion and annular fissure are noted. This results in mild left lateral recess and foraminal stenosis.  L3-4: A broad-based disc protrusion is present. Mild facet hypertrophy and short pedicles contribute to mild lateral recess and foraminal stenosis bilaterally.  L4-5: A right paramedian disc protrusion is present. Mild facet hypertrophy and short pedicles are noted. Moderate lateral recess and foraminal stenosis is worse on the right.  L5-S1: A broad-based disc protrusion is present. Facet hypertrophy and short pedicles contribute to mild lateral recess and foraminal stenosis bilaterally.  IMPRESSION: 1. 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 2. No acute fracture. 3. Cystic lesions in the kidneys bilaterally. 4. Mild lateral recess narrowing bilaterally at L1-2. 5. Mild left lateral recess and foraminal stenosis at L2-3 due to a far left lateral disc protrusion. 6. Mild lateral recess and foraminal narrowing bilaterally at L3-4 due to a broad-based disc protrusion, facet hypertrophy, and short pedicles. 7. A right paramedian disc protrusion at L4-5 leads to moderate lateral recess and foraminal stenosis,  right greater than left. 8. Mild lateral recess and foraminal stenosis at L5-S1.   Electronically Signed   By: Lawrence Santiago M.D.   On: 01/16/2014 18:35   Mr Jodene Nam Head/brain Wo Cm  01/16/2014   CLINICAL DATA:  Increasing frequency of falls. Right leg weakness. Slurred speech and facial drooping. TIAs.  EXAM: MRI HEAD WITHOUT CONTRAST  MRA HEAD WITHOUT CONTRAST  TECHNIQUE: Multiplanar, multiecho pulse sequences of the brain and surrounding structures were obtained without intravenous contrast. Angiographic images of the head were obtained using MRA technique without contrast.  COMPARISON:  CT head without contrast 01/15/2014. MRI brain 06/06/2000.  FINDINGS: MRI HEAD FINDINGS  Acute nonhemorrhagic left paramedian pontine infarct is  evident. Associated T2 and FLAIR signal hyperintensity is associated. A remote infarct of the left basal ganglia and corona radiata is again noted. Mild to moderate periventricular and subcortical white matter changes are noted bilaterally.  Flow is present in the major intracranial arteries. The globes and orbits are intact. The paranasal sinuses and mastoid air cells are clear.  MRA HEAD FINDINGS  Mild atherosclerotic irregularity is present within the cavernous carotid arteries bilaterally without significant stenosis. The A1 and M1 segments are normal. The anterior communicating artery is patent. Posterior communicating arteries are present bilaterally. The MCA bifurcations are intact. There is some attenuation of MCA branch vessels bilaterally.  The left vertebral artery is dominant. The PICA vessels are not seen. Dominant AICA vessels are noted bilaterally. The basilar artery demonstrates mild atherosclerotic change without significant stenosis. Both posterior cerebral arteries originate from the basilar tip. There is a moderate stenosis of the proximal right posterior cerebral artery. There is some attenuation of PCA branch vessels bilaterally.  IMPRESSION: 1. Acute  nonhemorrhagic left paramedian pontine infarct. 2. Remote infarct of the left basal ganglia and coronal radiata. 3. Mild to moderate periventricular and subcortical white matter disease bilaterally likely reflects the sequela of chronic microvascular ischemia. 4. Atherosclerotic irregularities within the cavernous carotid arteries bilaterally as well as the basilar artery without a significant stenosis relative to the distal vessels. 5. Moderate stenosis of the proximal right P1 segment. These results were called by telephone at the time of interpretation on 01/16/2014 at 5:54 PM to Dr. Candiss Norse, who verbally acknowledged these results.   Electronically Signed   By: Lawrence Santiago M.D.   On: 01/16/2014 17:54    Assessment/Plan: Diagnosis: acute left paramedian pontine infarct 1. Does the need for close, 24 hr/day medical supervision in concert with the patient's rehab needs make it unreasonable for this patient to be served in a less intensive setting? Yes 2. Co-Morbidities requiring supervision/potential complications: htn, gerd, neuropathy 3. Due to bladder management, bowel management, safety, skin/wound care, disease management, medication administration and patient education, does the patient require 24 hr/day rehab nursing? Yes 4. Does the patient require coordinated care of a physician, rehab nurse, PT (1-2 hrs/day, 5 days/week), OT (1-2 hrs/day, 5 days/week) and SLP (1-2 hrs/day, 5 days/week) to address physical and functional deficits in the context of the above medical diagnosis(es)? Yes Addressing deficits in the following areas: balance, endurance, locomotion, strength, transferring, bowel/bladder control, bathing, dressing, feeding, grooming, toileting, speech and psychosocial support 5. Can the patient actively participate in an intensive therapy program of at least 3 hrs of therapy per day at least 5 days per week? Yes 6. The potential for patient to make measurable gains while on inpatient  rehab is excellent 7. Anticipated functional outcomes upon discharge from inpatient rehab are modified independent  with PT, modified independent with OT, modified independent with SLP. 8. Estimated rehab length of stay to reach the above functional goals is: 12-16 days 9. Does the patient have adequate social supports to accommodate these discharge functional goals? Yes 10. Anticipated D/C setting: Home 11. Anticipated post D/C treatments: HH therapy and Outpatient therapy 12. Overall Rehab/Functional Prognosis: excellent  RECOMMENDATIONS: This patient's condition is appropriate for continued rehabilitative care in the following setting: CIR Patient has agreed to participate in recommended program. Yes Note that insurance prior authorization may be required for reimbursement for recommended care.  Comment: Rehab Admissions Coordinator to follow up.  Thanks,  Meredith Staggers, MD, Mellody Drown     01/17/2014

## 2014-01-17 NOTE — Progress Notes (Signed)
  RD consulted for nutrition education regarding diabetes.   Lab Results  Component Value Date   HGBA1C 10.6* 01/16/2014    RD provided "Carbohydrate Counting for People with Diabetes" handout from the Academy of [employer]. Discussed different food groups and their effects on blood sugar, emphasizing carbohydrate-containing foods. Provided list of carbohydrates and recommended serving sizes of common foods.   Discussed importance of controlled and consistent carbohydrate intake throughout the day. Provided examples of ways to balance meals/snacks and encouraged intake of high-fiber, whole grain complex carbohydrates. Reviewed patient's dietary recall and provided tips for better glucose control. Teach back method used. Pt states he will stop eating sweets/drinking sugary beverages and will decrease his portion sizes of carbohydrates.  Expect fair compliance.  Body mass index is 28.34 kg/(m^2). Pt meets criteria for Overweight based on current BMI.  Current diet order is Carb Modified, patient is consuming approximately 100% of meals at this time. Pt reports good appetite. He states he used to weigh 218 lbs and has gradually been losing weight over the last couple years. Labs and medications reviewed. No further nutrition interventions warranted at this time. RD contact information provided. If additional nutrition issues arise, please re-consult RD.  Pryor Ochoa RD, LDN Inpatient Clinical Dietitian Pager: [phone] After Hours Pager: [phone]

## 2014-01-17 NOTE — Progress Notes (Signed)
Stroke Team Progress Note  HISTORY Maurice Stone is an 73 y.o. male who reports that he has been falling for the past 6 years. It has become more and more frequent and became a real issue this weekend. He reports that he felt as if his right leg was weak. Patient was seen by another facility at that time and discharged to home. Patient's family is in the room tonight. They report that visit was on Friday and they did not note a facial droop at that time. He was seen again on Sunday and no facial droop was noted. When his daughter talked to him on the phone yesterday she felt that his speech was slurred. Today 01/15/2014 when seen by family he was felt to have a facial droop and was taken again to the ED. On this evaluation he was felt to have right sided weakness and a possible stroke. Patient was transferred here for further evaluation. Patient was not administered TPA secondary to Unable to determine LKW. He was admitted to the Internal Medicine Service for further evaluation and treatment. Neurology is consulting.   SUBJECTIVE Family at bedside.  OBJECTIVE Most recent Vital Signs: Filed Vitals:   01/17/14 0500 01/17/14 0600 01/17/14 1004 01/17/14 1344  BP:  108/65 120/63 155/66  Pulse:  43 51 51  Temp:  98.1 F (36.7 C) 97.9 F (36.6 C) 97.7 F (36.5 C)  TempSrc:  Oral Oral Oral  Resp:  20 18 20   Height: 5\' 10"  (1.778 m)     Weight: 89.585 kg (197 lb 8 oz)     SpO2:  98% 97% 97%   CBG (last 3)   Recent Labs  01/17/14 0646 01/17/14 1134 01/17/14 1631  GLUCAP 100* 308* 201*    IV Fluid Intake:     MEDICATIONS  . amLODipine  10 mg Oral Daily  . aspirin  325 mg Oral Daily  . atorvastatin  20 mg Oral q1800  . buPROPion  100 mg Oral BID  . donepezil  10 mg Oral QHS  . fenofibrate  160 mg Oral Daily  . gabapentin  600 mg Oral TID  . heparin  5,000 Units Subcutaneous 3 times per day  . insulin aspart  0-15 Units Subcutaneous TID WC  . insulin aspart  0-5 Units Subcutaneous  QHS  . insulin aspart  15 Units Subcutaneous TID WC  . insulin glargine  45 Units Subcutaneous BID  . loratadine  10 mg Oral Daily  . losartan  100 mg Oral Daily  . metoprolol  50 mg Oral BID  . pantoprazole  40 mg Oral Daily  . QUEtiapine  200 mg Oral QHS  . sertraline  200 mg Oral Daily  . sulindac  150 mg Oral BID WC   PRN:  albuterol, HYDROcodone-acetaminophen  Diet:  Carb Control thin liquids Activity:   Up with assistance DVT Prophylaxis: SQ heparin  CLINICALLY SIGNIFICANT STUDIES Basic Metabolic Panel:   Recent Labs Lab 01/15/14 1445  NA 140  K 3.8  CL 101  CO2 25  GLUCOSE 218*  BUN 17  CREATININE 1.00  CALCIUM 9.6   Liver Function Tests:   Recent Labs Lab 01/15/14 1445  AST 43*  ALT 36  ALKPHOS 117  BILITOT 0.7  PROT 7.5  ALBUMIN 4.0   CBC:   Recent Labs Lab 01/15/14 1445  WBC 9.9  NEUTROABS 5.6  HGB 14.7  HCT 42.6  MCV 89.3  PLT 144*   Coagulation:   Recent Labs Lab  01/15/14 1445  LABPROT 14.0  INR 1.10   Cardiac Enzymes:   Recent Labs Lab 01/15/14 1445  TROPONINI <0.30   Urinalysis:   Recent Labs Lab 01/16/14 0134  COLORURINE YELLOW  LABSPEC 1.025  PHURINE 5.5  GLUCOSEU >1000*  HGBUR NEGATIVE  BILIRUBINUR NEGATIVE  KETONESUR NEGATIVE  PROTEINUR NEGATIVE  UROBILINOGEN 1.0  NITRITE NEGATIVE  LEUKOCYTESUR NEGATIVE   Lipid Panel    Component Value Date/Time   CHOL 164 01/16/2014 0605   TRIG 248* 01/16/2014 0605   HDL 40 01/16/2014 0605   CHOLHDL 4.1 01/16/2014 0605   VLDL 50* 01/16/2014 0605   LDLCALC 74 01/16/2014 0605   HgbA1C  Lab Results  Component Value Date   HGBA1C 10.6* 01/16/2014    Urine Drug Screen:     Component Value Date/Time   LABOPIA NONE DETECTED 01/16/2014 0134   COCAINSCRNUR NONE DETECTED 01/16/2014 0134   LABBENZ NONE DETECTED 01/16/2014 0134   AMPHETMU NONE DETECTED 01/16/2014 0134   THCU NONE DETECTED 01/16/2014 0134   LABBARB NONE DETECTED 01/16/2014 0134    Alcohol Level:   Recent  Labs Lab 01/15/14 1445  ETH <11     MRI Lumbar Spine 01/16/2014    1. 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 2. No acute fracture. 3. Cystic lesions in the kidneys bilaterally. 4. Mild lateral recess narrowing bilaterally at L1-2. 5. Mild left lateral recess and foraminal stenosis at L2-3 due to a far left lateral disc protrusion. 6. Mild lateral recess and foraminal narrowing bilaterally at L3-4 due to a broad-based disc protrusion, facet hypertrophy, and short pedicles. 7. A right paramedian disc protrusion at L4-5 leads to moderate lateral recess and foraminal stenosis, right greater than left. 8. Mild lateral recess and foraminal stenosis at L5-S1.      CT of the brain  01/15/2014 Microvascular ischemic disease without acute intracranial process.   MRI of the brain  01/16/2014   1. Acute nonhemorrhagic left paramedian pontine infarct. 2. Remote infarct of the left basal ganglia and coronal radiata. 3. Mild to moderate periventricular and subcortical white matter disease bilaterally likely reflects the sequela of chronic microvascular ischemia.   MRA of the brain  01/16/2014    4. Atherosclerotic irregularities within the cavernous carotid arteries bilaterally as well as the basilar artery without a significant stenosis relative to the distal vessels. 5. Moderate stenosis of the proximal right P1 segment.   2D Echocardiogram  EF 55-60% with no source of embolus.   Carotid Doppler  No evidence of hemodynamically significant internal carotid artery stenosis. Vertebral artery flow is antegrade.   EKG   Sinus bradycardia. Left anterior fascicular block. Nonspecific T wave abnormality. Abnormal ECG. Since last tracing rate slower  Therapy Recommendations CIR   Physical Exam  GENERAL EXAM: Patient is in no distress; well developed, nourished and groomed; neck is supple  CARDIOVASCULAR: Regular rate and rhythm, no murmurs, no carotid bruits  NEUROLOGIC: MENTAL  STATUS: awake, alert, language fluent, comprehension intact, naming intact, fund of knowledge appropriate CRANIAL NERVE: pupils equal and reactive to light, visual fields full to confrontation, extraocular muscles intact, no nystagmus, facial sensation; DECR RIGHT LOWER FACIAL STRENGTH; hearing intact, palate elevates symmetrically, uvula midline, shoulder shrug symmetric, tongue midline. MOTOR: normal bulk and tone, full strength in the LUE, LLE; RUE AND RLE 4+/5.  SENSORY: normal and symmetric to light touch COORDINATION: finger-nose-finger, fine finger movements SLOW ON RIGHT SIDE REFLEXES: deep tendon reflexes TRACE and symmetric GAIT/STATION: SITTING IN BED  ASSESSMENT Maurice Stone is a 73 y.o. male presenting with worsening weakness on the right side, dysarthria. The patient has a prior history of a left brain stroke with residual right-sided weakness. The patient lives alone, walks with a cane usually. The patient was on aspirin prior to admission. He was not a TPA candidate secondary to delay in presentation. The stroke deficits came on gradually over several days.   Acute left pontine infarct stroke secondary to intrinsic atherosclerosis intracranially  Hypertension  Diabetes, uncontrolled, HgbA1c 10.6  Coronary artery disease  COPD  Prior history of left brain stroke  Dyslipidemia, LDL 74, on pravachol 20 mg daily prior to admission, now on lipitor 20 mg daily  Memory disturbance. On [facility] day # 2  TREATMENT/PLAN  Change aspirin to plavix for secondary stroke prevention  Ongoing aggressive risk factor control  Dispo - CIR    Patient has a 10-15% risk of having another stroke over the next year, the highest risk is within 2 weeks of the most recent stroke/TIA (risk of having a stroke following a stroke or TIA is the same).  Ongoing risk factor control by Primary Care Physician  Stroke Service will sign off. Please call should any needs  arise.  Follow up with Dr. Erlinda Hong, [facility], in 2 months.  SIGNED Burnetta Sabin, MSN, RN, ANVP-BC, ANP-BC, GNP-BC [facility] Pager: [phone] 01/17/2014 4:59 PM  I have personally obtained a history, examined the patient, evaluated imaging results, and formulated the assessment and plan of care. I agree with the above.  Penni Bombard, MD 6/54/6503, 5:46 PM Certified in Neurology, Neurophysiology and Neuroimaging [facility] - Stroke Team  To contact Stroke Continuity provider, please refer to [email or web address] After hours, contact General Neurology
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
## 2014-01-17 NOTE — Progress Notes (Signed)
Inpatient Diabetes Program Recommendations  AACE/ADA: New Consensus Statement on Inpatient Glycemic Control (2013)  Target Ranges:  Prepandial:   less than 140 mg/dL      Peak postprandial:   less than 180 mg/dL (1-2 hours)      Critically ill patients:  140 - 180 mg/dL     Results for PEARSON, PICOU (MRN [id number]) as of 01/17/2014 12:48  Ref. Range 01/16/2014 07:36 01/16/2014 11:11 01/16/2014 18:36 01/16/2014 22:50  Glucose-Capillary Latest Range: 70-99 mg/dL 228 (H) 312 (H) 121 (H) 128 (H)    Results for YANI, LAL (MRN [id number]) as of 01/17/2014 12:48  Ref. Range 01/17/2014 06:46 01/17/2014 11:34  Glucose-Capillary Latest Range: 70-99 mg/dL 100 (H) 308 (H)    Results for AMEL, GIANINO (MRN [id number]) as of 01/17/2014 12:48  Ref. Range 01/16/2014 06:05  Hemoglobin A1C Latest Range: <5.7 % 10.6 (H)     **Note Lantus increased to 45 units bid and Novolog Meal Coverage increased to 15 units tidwc today- Agree  **Spoke with patient about about his elevated A1c of 10.6%.  Explained what an A1c is and what it measures.  Reminded patient that his goal A1c is 7% or less per ADA standards to prevent both acute and long-term complications.  Encouraged patient to check his CBGs at least tid at home and to record all CBGs in a logbook for his PCP to review.  Patient was surprised that his A1c was so high, but patient did tell me that he sometimes forgets to take his insulin and that he also has been drinking a lot of sweet tea and not monitoring his portion sizes like he should.  Will have dietitian follow up with patient to answer any of his dietary questions.  Patient told me he plans to follow up with Dr. Oletta Lamas after discharge.    Will follow Wyn Quaker RN, MSN, CDE Diabetes Coordinator Inpatient Diabetes Program Team Pager: [phone] (8a-10p)

## 2014-01-17 NOTE — Progress Notes (Signed)
Patient Demographics  Maurice Stone, is a 73 y.o. male, DOB - 1941/05/05, KDX:[id number]  Admit date - 01/15/2014   Admitting Physician Thurnell Lose, MD  Outpatient Primary MD for the patient is No primary Fortune Torosian on file.  LOS - 2   Chief Complaint  Patient presents with  . Fall           Subjective:   [name] today has, No headache, No chest pain, No abdominal pain - No Nausea, No new weakness tingling or numbness, No Cough - SOB.  Still has right-sided weakness which is improving.    Assessment & Plan    1.CVA - with acute on chronic right-sided weakness, we'll continue full stroke workup with monitoring on  tele, obtain PT, OT, speech input, A1c and lipid panel, noted MRI MRA brain, echogram, carotid duplex, neurology following, aspirin antiplatelet therapy per neurology    Lab Results  Component Value Date   HGBA1C 10.6* 01/16/2014    Lab Results  Component Value Date   CHOL 164 01/16/2014   HDL 40 01/16/2014   LDLCALC 74 01/16/2014   TRIG 248* 01/16/2014   CHOLHDL 4.1 01/16/2014    Carotids  Preliminary report: Bilateral: 1-39% ICA stenosis. Vertebral artery flow is antegrade.     TTE  - Left ventricle: The cavity size was normal. There was moderate concentric hypertrophy. Systolic function was normal. The estimated ejection fraction was in the range of 55% to 60%. Wall motion was normal; there were no regional wall motion abnormalities. Doppler parameters are consistent with abnormal left ventricular relaxation (grade 1 diastolic dysfunction). The E/e&' ratio is <8, suggesting normal LV filling pressure. - Left atrium: The atrium was normal in size.      2.DM2 - noted A1c,  increase Lantus, scheduled NovoLog along with sliding scale monitor  Lab Results   Component Value Date   HGBA1C 10.6* 01/16/2014     CBG (last 3)   Recent Labs  01/16/14 2250 01/17/14 0646 01/17/14 1134  GLUCAP 128* 100* 308*       3.HTN - continue present regimen which includes Norvasc, Cozaar and beta blocker.    4. Dyslipidemia. Increased statin dose and added TriCor for better control.    5. GERD on PPI continue.    6. Depression anxiety. On Zoloft Seroquel which will be continued.    7. Diabetic neuropathy. Supportive care.    8. Fall with low back pain. MRI L spine shows diffuse DJD - outpt N Surg follow up.        Code Status: Full  Family Communication:    Disposition Plan: SNF   Procedures  CT head, MRI/MRA brain, MRI L-spine, echogram, carotid duplex   Consults  Neuro   Medications  Scheduled Meds: . amLODipine  10 mg Oral Daily  . aspirin  325 mg Oral Daily  . atorvastatin  20 mg Oral q1800  . buPROPion  100 mg Oral BID  . donepezil  10 mg Oral QHS  . fenofibrate  160 mg Oral Daily  . gabapentin  600 mg Oral TID  . heparin  5,000 Units Subcutaneous 3 times per day  . insulin aspart  0-15 Units Subcutaneous TID WC  . insulin aspart  0-5 Units  Subcutaneous QHS  . insulin aspart  12 Units Subcutaneous TID WC  . insulin glargine  40 Units Subcutaneous BID  . loratadine  10 mg Oral Daily  . losartan  100 mg Oral Daily  . metoprolol  50 mg Oral BID  . pantoprazole  40 mg Oral Daily  . QUEtiapine  200 mg Oral QHS  . sertraline  200 mg Oral Daily  . sulindac  150 mg Oral BID WC   Continuous Infusions:  PRN Meds:.albuterol, HYDROcodone-acetaminophen  DVT Prophylaxis   Heparin    Lab Results  Component Value Date   PLT 144* 01/15/2014    Antibiotics     Anti-infectives   None          Objective:   Filed Vitals:   01/17/14 0200 01/17/14 0500 01/17/14 0600 01/17/14 1004  BP: 135/66  108/65 120/63  Pulse: 48  43 51  Temp: 98.2 F (36.8 C)  98.1 F (36.7 C) 97.9 F (36.6 C)  TempSrc: Oral   Oral Oral  Resp: 20  20 18   Height:  5\' 10"  (1.778 m)    Weight:  89.585 kg (197 lb 8 oz)    SpO2: 98%  98% 97%    Wt Readings from Last 3 Encounters:  01/17/14 89.585 kg (197 lb 8 oz)     Intake/Output Summary (Last 24 hours) at 01/17/14 1159 Last data filed at 01/16/14 1546  Gross per 24 hour  Intake      0 ml  Output    200 ml  Net   -200 ml     Physical Exam  Awake Alert, Oriented X 3, No new F.N deficits, Normal affect, L side weakes than R [place].AT,PERRAL Supple Neck,No JVD, No cervical lymphadenopathy appriciated.  Symmetrical Chest wall movement, Good air movement bilaterally, CTAB RRR,No Gallops,Rubs or new Murmurs, No Parasternal Heave +ve B.Sounds, Abd Soft, No tenderness, No organomegaly appriciated, No rebound - guarding or rigidity. No Cyanosis, Clubbing or edema, No new Rash or bruise      Data Review   Micro Results No results found for this or any previous visit (from the past 240 hour(s)).  Radiology Reports Dg Lumbar Spine Complete  01/15/2014   CLINICAL DATA:  Low back pain, continuous falls  EXAM: LUMBAR SPINE - COMPLETE 4+ VIEW  COMPARISON:  None.  FINDINGS: There are 5 non rib-bearing lumbar type vertebral bodies.  There is a mild scoliotic curvature of the thoracolumbar spine with dominant inferior curvature convex to the left. There is mild straightening the expected lumbar lordosis. No anterolisthesis or retrolisthesis.  Lumbar vertebral body heights are preserved.  There is mild-to-moderate multilevel lumbar spine DDD, worse at T11-T12, L1-L2 and L5-S1 with disc space height loss, endplate irregularity and sclerosis.  Limited visualization the bilateral SI joints is normal.  Regional soft tissues appear normal.  IMPRESSION: 1. No acute findings. 2. Mild-to-moderate multilevel lumbar spine DDD.   Electronically Signed   By: Sandi Mariscal M.D.   On: 01/15/2014 15:26   Ct Head Wo Contrast  01/15/2014   CLINICAL DATA:  Post fall, now with injury in weakness   EXAM: CT HEAD WITHOUT CONTRAST  TECHNIQUE: Contiguous axial images were obtained from the base of the skull through the vertex without intravenous contrast.  COMPARISON:  None.  FINDINGS: There is an old lacunar infarct within the left the coronal radiata (image 19, series 2). Scattered minimal periventricular hypodensities compatible with microvascular ischemic disease. The gray-white differentiation is otherwise well maintained without  CT evidence of acute large territory infarct. No intraparenchymal or extra-axial mass or hemorrhage. Normal size and configuration of the ventricles and basilar cisterns. No midline shift. Limited visualization of the paranasal sinuses and mastoid air cells are normal. No air-fluid levels. Intracranial atherosclerosis. Regional soft tissues appear normal. No displaced calvarial fracture.  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   Electronically Signed   By: Sandi Mariscal M.D.   On: 01/15/2014 15:28    CBC  Recent Labs Lab 01/15/14 1445  WBC 9.9  HGB 14.7  HCT 42.6  PLT 144*  MCV 89.3  MCH 30.8  MCHC 34.5  RDW 13.7  LYMPHSABS 3.3  MONOABS 0.9  EOSABS 0.2  BASOSABS 0.0    Chemistries   Recent Labs Lab 01/15/14 1445  NA 140  K 3.8  CL 101  CO2 25  GLUCOSE 218*  BUN 17  CREATININE 1.00  CALCIUM 9.6  AST 43*  ALT 36  ALKPHOS 117  BILITOT 0.7   ------------------------------------------------------------------------------------------------------------------ estimated creatinine clearance is 76.3 ml/min (by C-G formula based on Cr of 1). ------------------------------------------------------------------------------------------------------------------  Recent Labs  01/16/14 0605  HGBA1C 10.6*   ------------------------------------------------------------------------------------------------------------------  Recent Labs  01/16/14 0605  CHOL 164  HDL 40  LDLCALC 74  TRIG 248*  CHOLHDL 4.1    ------------------------------------------------------------------------------------------------------------------ No results found for this basename: TSH, T4TOTAL, FREET3, T3FREE, THYROIDAB,  in the last 72 hours ------------------------------------------------------------------------------------------------------------------ No results found for this basename: VITAMINB12, FOLATE, FERRITIN, TIBC, IRON, RETICCTPCT,  in the last 72 hours  Coagulation profile  Recent Labs Lab 01/15/14 1445  INR 1.10    No results found for this basename: DDIMER,  in the last 72 hours  Cardiac Enzymes  Recent Labs Lab 01/15/14 1445  TROPONINI <0.30   ------------------------------------------------------------------------------------------------------------------ No components found with this basename: POCBNP,      Time Spent in minutes 35   SINGH,PRASHANT K M.D on 01/17/2014 at 11:59 AM  Between 7am to 7pm - Pager - [phone]  After 7pm go to [email or web address] - password [email or web address]  And look for the night coverage person covering for me after hours  [facility] Office  [phone]   **Disclaimer: This note may have been dictated with voice recognition software. Similar sounding words can inadvertently be transcribed and this note may contain transcription errors which may not have been corrected upon publication of note.**

## 2014-01-17 NOTE — Progress Notes (Addendum)
Rehab admissions - I met with pt and his daughter in follow up to rehab MD consult earlier today. I explained the possibility of inpatient rehab and questions were answered. Informational brochures were given. Pt is highly motivated to return to being independent and pt/dtr are interested in pursuing inpatient rehab.  I then spoke with Dr. Candiss Norse who stated that pt will likely be medically ready for CIR tomorrow. We will consider inpatient rehab admission pending his medical clearance and our bed availability. I updated Hassan Rowan, Tourist information centre manager as well.  I will check on pt's status tomorrow and proceed from there. Please call me with any questions.  Thanks.  Nanetta Batty, PT Rehabilitation Admissions Coordinator [phone]

## 2014-01-17 NOTE — Progress Notes (Signed)
Patient has specific concerns regarding his discharge. He fears going to a SNF, as he feels it will take away from his independence. He would like to know if he could go to rehab and then upon d/c from rehab, could he have home health aide. He does not want to leave his residence. He would to know if he should speak with social work regarding. Encouraged patient to address concerns with physician.

## 2014-01-17 NOTE — Clinical Social Work Note (Signed)
CSW consulted pt's discharge disposition with CIR admissions liaison. Per CIR admissions liaison, pt to be admitted to CIR once medically stable. CSW signing off. Please re consult if discharge disposition changes.  Lubertha Sayres, MSW, [facility] Licensed Clinical Social Worker [phone] and [phone]

## 2014-01-17 NOTE — Progress Notes (Signed)
Physical Therapy Treatment Patient Details Name: Maurice Stone MRN: [id number] DOB: 1940/08/21 Today's Date: 01/17/2014    History of Present Illness Donaciano E. Nauta is a 73 y.o. Male admitted 01/15/14 due to a fall at home. Pt reports history of multiple falls over the past years, becoming increasing frequent. Family reported that pt was presenting with slurred speech and facial droop and admitted pt to ED.  MRI on 01/15/14 shows  acute nonhemorrhagic left paramedian pontine infarct.    PT Comments    Continue to recommend comprehensive inpatient rehab (CIR) for post-acute therapy needs.  Able to initiate gait training today with R knee buckeling at times and impulsivity noted.  Pt is very motivated to work with rehab to return to PLOF.   Follow Up Recommendations  CIR     Equipment Recommendations  None recommended by PT    Recommendations for Other Services       Precautions / Restrictions Precautions Precautions: Fall Precaution Comments: R sided hemiparesis and R knee buckling Restrictions Weight Bearing Restrictions: No    Mobility  Bed Mobility Overal bed mobility: Needs Assistance Bed Mobility: Supine to Sit     Supine to sit: Min assist     General bed mobility comments: HOB elevated  Transfers Overall transfer level: Needs assistance Equipment used: Rolling walker (2 wheeled) Transfers: Sit to/from Stand Sit to Stand: Min guard Stand pivot transfers: Mod assist       General transfer comment: muti-modal cueing for proper technique  Ambulation/Gait Ambulation/Gait assistance: +2 safety/equipment;Max assist Ambulation Distance (Feet): 17 Feet Assistive device: Rolling walker (2 wheeled) Gait Pattern/deviations: Step-through pattern;Narrow base of support;Ataxic     General Gait Details: Pt required 2nd person physical A to block R knee for first 4-5 feet of gait.  Taking large steps out of BOS of RW.  responded well to cueing for taking smaller  "baby" steps, but needed constant cueing with each step.  Impulsive and at times taking L UE off of RW.  Difficulty WB through R UE and decreased grasp on RW.  2nd person for safety and to A with BSC and recliner as pt needs physical A to maintain standing.   Stairs            Wheelchair Mobility    Modified Rankin (Stroke Patients Only) Modified Rankin (Stroke Patients Only) Pre-Morbid Rankin Score: Slight disability Modified Rankin: Moderately severe disability     Balance Overall balance assessment: Needs assistance   Sitting balance-Leahy Scale: Fair     Standing balance support: Bilateral upper extremity supported Standing balance-Leahy Scale: Poor Standing balance comment: stands with narrow BOS and occasional R knee buckeling                    Cognition Arousal/Alertness: Awake/alert Behavior During Therapy: WFL for tasks assessed/performed;Impulsive Overall Cognitive Status: Impaired/Different from baseline Area of Impairment: Attention;Safety/judgement;Problem solving   Current Attention Level: Sustained     Safety/Judgement: Decreased awareness of safety   Problem Solving: Slow processing;Difficulty sequencing      Exercises      General Comments General comments (skin integrity, edema, etc.): Pt slightly impulsive at times, but cooperative and motivated.      Pertinent Vitals/Pain No reports of pain.    Home Living Family/patient expects to be discharged to:: Inpatient rehab Living Arrangements: Alone             Additional Comments: Pt lives alone and has family available PRN. Pt would be excellent CIR  candidate.     Prior Function Level of Independence: Independent with assistive device(s)  Gait / Transfers Assistance Needed: pt was amb safely with RW until recently, now he reports multiple falls a day       PT Goals (current goals can now be found in the care plan section) Acute Rehab PT Goals Patient Stated Goal: to go to  rehab PT Goal Formulation: With patient Time For Goal Achievement: 01/30/14 Potential to Achieve Goals: Good Progress towards PT goals: Progressing toward goals    Frequency  Min 4X/week    PT Plan Current plan remains appropriate    Co-evaluation             End of Session Equipment Utilized During Treatment: Gait belt Activity Tolerance: Patient tolerated treatment well Patient left: in chair;with call bell/phone within reach;with chair alarm set;with family/visitor present     Time: 0932-3557 PT Time Calculation (min): 24 min  Charges:  $Gait Training: 8-22 mins $Therapeutic Activity: 8-22 mins                    G Codes:      SMITH,KAREN LUBECK 01/17/2014, 1:46 PM

## 2014-01-17 NOTE — PMR Pre-admission (Signed)
PMR Admission Coordinator Pre-Admission Assessment  Patient: Maurice Stone is an 73 y.o., male MRN: [id number] DOB: [birth year] Height: 5\' 10"  (177.8 cm) Weight: 89.585 kg (197 lb 8 oz)              Insurance Information HMO:     PPO:      PCP:      IPA:      80/20:      OTHER:  PRIMARY: [employer]      Policy#: [id number]      Subscriber: self Benefits:  Phone #: [phone]      SECONDARY: Medicare Part A only      Policy#: [id number]      Subscriber: self CM Name:       Phone#:      Fax#:  Pre-Cert#: verified in Visual merchandiser: retired Benefits:  Phone #:      Name:  Eff. Date: A: 07-27-07     Deduct: $1260      Out of Pocket Max: none      Life Max: unlimited CIR: 100%      SNF: no SNF coverage through [facility] Outpatient: no MC coverage     Co-Pay: Home Health: 100%       Co-Pay: none, no visit limits DME: no MC coverage     Co-Pay:    Emergency Contact Information Contact Information   Name Relation Home Work Maurice Stone [phone]  [phone]   Maurice Stone   [phone]     Current Medical History  Patient Admitting Diagnosis: acute left paramedian pontine infarct  History of Present Illness: Maurice Stone is a 73 y.o. right handed male with history of hypertension, diabetes mellitus, CAD,COPD as well as CVA 6 years ago with some residual slurred speech maintained on aspirin. Patient lives alone independent prior to admission. Presented 01/15/2014 with frequent falls and right-sided weakness and slurred speech. MRI shows acute nonhemorrhagic left paramedian pontine infarct as well as remote infarct left basal ganglia. MRA of the head without stenosis or occlusion. Echocardiogram with ejection fraction 50% grade 1 diastolic dysfunction. Carotid Dopplers no ICA stenosis. Patient did not receive TPA. Neurology services consulted aspirin increased to 325 mg daily as well as the addition of subcutaneous heparin for DVT prophylaxis.  Hemoglobin A1c of 10.6 with insulin therapy as directed. Patient is tolerating a regular consistency diet. Physical therapy evaluation completed 01/16/2014 with recommendations of physical medicine rehabilitation consult  NIH Total: 1  Past Medical History  Past Medical History  Diagnosis Date  . Hypertension   . Diabetes mellitus without complication   . Coronary artery disease   . COPD (chronic obstructive pulmonary disease)   . Stroke     Family History  family history is not on file.  Prior Rehab/Hospitalizations:  Pt had previous L TKR at [facility] and follow up outpt PT.   Current Medications  Current facility-administered medications:albuterol (PROVENTIL) (2.5 MG/3ML) 0.083% nebulizer solution 2.5 mg, 2.5 mg, Nebulization, Q4H PRN, Berle Mull, MD;  amLODipine (NORVASC) tablet 10 mg, 10 mg, Oral, Daily, Berle Mull, MD, 10 mg at 01/18/14 0958;  atorvastatin (LIPITOR) tablet 20 mg, 20 mg, Oral, q1800, Thurnell Lose, MD, 20 mg at 01/17/14 1711 buPROPion [facility]) tablet 100 mg, 100 mg, Oral, BID, Berle Mull, MD, 100 mg at 01/18/14 2774;  clopidogrel (PLAVIX) tablet 75 mg, 75 mg, Oral, Daily, Donzetta Starch, NP, 75 mg at 01/18/14 0959;  donepezil (  ARICEPT) tablet 10 mg, 10 mg, Oral, QHS, Berle Mull, MD, 10 mg at 01/17/14 2131;  fenofibrate tablet 160 mg, 160 mg, Oral, Daily, Thurnell Lose, MD, 160 mg at 01/18/14 0959 gabapentin (NEURONTIN) tablet 600 mg, 600 mg, Oral, TID, Berle Mull, MD, 600 mg at 01/18/14 6789;  heparin injection 5,000 Units, 5,000 Units, Subcutaneous, 3 times per day, Berle Mull, MD, 5,000 Units at 01/18/14 3810;  HYDROcodone-acetaminophen (NORCO/VICODIN) 5-325 MG per tablet 1 tablet, 1 tablet, Oral, Q6H PRN, Berle Mull, MD, 1 tablet at 01/18/14 0145 insulin aspart (novoLOG) injection 0-15 Units, 0-15 Units, Subcutaneous, TID WC, Berle Mull, MD, 5 Units at 01/17/14 1710;  insulin aspart (novoLOG) injection 0-5 Units, 0-5 Units, Subcutaneous, QHS,  Berle Mull, MD, 2 Units at 01/17/14 2157;  insulin aspart (novoLOG) injection 15 Units, 15 Units, Subcutaneous, TID WC, Thurnell Lose, MD, 15 Units at 01/18/14 0753 insulin glargine (LANTUS) injection 45 Units, 45 Units, Subcutaneous, BID, Thurnell Lose, MD, 45 Units at 01/18/14 1001;  loratadine (CLARITIN) tablet 10 mg, 10 mg, Oral, Daily, Berle Mull, MD, 10 mg at 01/18/14 1751;  losartan (COZAAR) tablet 100 mg, 100 mg, Oral, Daily, Berle Mull, MD, 100 mg at 01/17/14 1005;  metoprolol (LOPRESSOR) tablet 50 mg, 50 mg, Oral, BID, Berle Mull, MD, 25 mg at 01/17/14 2133 pantoprazole (PROTONIX) EC tablet 40 mg, 40 mg, Oral, Daily, Berle Mull, MD, 40 mg at 01/18/14 0258;  QUEtiapine (SEROQUEL) tablet 200 mg, 200 mg, Oral, QHS, Berle Mull, MD, 200 mg at 01/17/14 2131;  sertraline (ZOLOFT) tablet 200 mg, 200 mg, Oral, Daily, Berle Mull, MD, 200 mg at 01/18/14 5277;  sulindac (CLINORIL) tablet 150 mg, 150 mg, Oral, BID WC, Berle Mull, MD, 150 mg at 01/18/14 8242  Patients Current Diet: Carb Control  Precautions / Restrictions Precautions Precautions: Fall Precaution Comments: R sided hemiparesis and R knee buckling Restrictions Weight Bearing Restrictions: No   Prior Activity Level Community (5-7x/wk): pt got out everyday, independent prior to admit, 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, international aid / [place] Devices/Equipment: None  Prior Functional Level Prior Function Level of Independence: Independent with assistive device(s) Gait / Transfers Assistance Needed: pt was amb safely with RW until recently, now he reports multiple falls a day ADL's / Homemaking Assistance Needed: was indep until recently  Current Functional Level Cognition  Overall Cognitive Status: Impaired/Different from baseline Current Attention Level: Sustained Orientation Level: Oriented X4 Safety/Judgement: Decreased awareness of safety    Extremity  Assessment (includes Sensation/Coordination)          ADLs  Overall ADL's : Needs assistance/impaired Eating/Feeding: Independent;Sitting Grooming: Oral care;Sitting;Set up Grooming Details (indicate cue type and reason): pt used RUE to hold toothpaste and LUE to twist lid off. Pt used RUE to brush teeth. Upper Body Bathing: Set up;Sitting Lower Body Bathing: Min guard;Sit to/from stand (RW) Upper Body Dressing : Set up;Sitting Lower Body Dressing: Min guard;Sit to/from stand (RW) Lower Body Dressing Details (indicate cue type and reason): pt able to reach Bil feet when sitting to don/doff socks Toilet Transfer: Stand-pivot;Moderate assistance;RW;BSC (mod (A) to prevent pt knee from buckling. ) Toileting- Clothing Manipulation and Hygiene: Min guard;Sit to/from stand (RW) Functional mobility during ADLs:  (unable to perform at this time due to R knee buckling) General ADL Comments: Pt highly motivated and enthusastic about therapy session asking "When do we start?" Pt reports increased strength in RUE and reports that he has been using RUE for functional tasks to increase coordination  and strength. Pt stood x2 with use of RW and took steps forward and backward to return to chair. Pt reports R knee is buckling when he shifts weight onto it. Pt performed grooming activities in sitting and educated pt on sitting during LB ADLs and grooming tasks to increase safety.     Mobility  Overal bed mobility: Needs Assistance Bed Mobility: Supine to Sit Supine to sit: Min assist General bed mobility comments: HOB elevated    Transfers  Overall transfer level: Needs assistance Equipment used: Rolling walker (2 wheeled) Transfers: Sit to/from Stand Sit to Stand: Min guard Stand pivot transfers: Mod assist General transfer comment: muti-modal cueing for proper technique    Ambulation / Gait / Stairs / Wheelchair Mobility  Ambulation/Gait Ambulation/Gait assistance: +2 safety/equipment;Max  assist Ambulation Distance (Feet): 17 Feet Assistive device: Rolling walker (2 wheeled) Gait Pattern/deviations: Step-through pattern;Narrow base of support;Ataxic General Gait Details: Pt required 2nd person physical A to block R knee for first 4-5 feet of gait.  Taking large steps out of BOS of RW.  responded well to cueing for taking smaller "baby" steps, but needed constant cueing with each step.  Impulsive and at times taking L UE off of RW.  Difficulty WB through R UE and decreased grasp on RW.  2nd person for safety and to A with BSC and recliner as pt needs physical A to maintain standing.    Posture / Balance Dynamic Sitting Balance Sitting balance - Comments: pt with posterior lean with dynamic activity, esp LE LAQ    Special needs/care consideration BiPAP/CPAP no (has been issued CPAP for home but does not use it; hasn't used it in over a year. "I feel claustrophobic when I used it.") CPM no  Continuous Drip IV no  Dialysis no          Life Vest no  Oxygen no  Special Bed no  Trach Size no  Wound Vac (area) no       Skin  -dry skin noted on pt's feet, small scrape on right cheek from recent fall                            Bowel mgmt: last BM on 01-16-14 Bladder mgmt: currently using urinal Diabetic mgmt - yes, managed at home with insulin   Previous Home Environment Living Arrangements: Alone Home Care Services: No Additional Comments: Pt lives alone and has family available PRN. Pt would be excellent CIR candidate.   Discharge Living Setting Plans for Discharge Living Setting: Patient's home Type of Home at Discharge: Apartment Discharge Home Layout: One level Discharge Home Access: Level entry Does the patient have any problems obtaining your medications?: No  Social/Family/Support Systems Contact Information: dtr Maurice Stone is primary contact Anticipated Caregiver: Maurice Stone and Stone Maurice Stone Anticipated Ambulance person Information: see above Ability/Limitations of  Caregiver: dtr does work during day, can help in evenings. Stone Maurice Stone can help during day. Caregiver Availability: 24/7 (Stone can help during day, dtr can help in pm) Discharge Plan Discussed with Primary Caregiver: Yes (discussed with dtr Maurice Stone on 6-25 and Stone Maurice Stone and dtr on 6-26) Is Caregiver In Agreement with Plan?: Yes Does Caregiver/Family have Issues with Lodging/Transportation while Pt is in Rehab?: No  Goals/Additional Needs Patient/Family Goal for Rehab: Mod indep. with PT, OT and SLP Expected length of stay: 12-16 days Cultural Considerations: Pt is a Jehovah's witness (no blood products) Dietary Needs: carb modified Equipment Needs: to be  determined Pt/Family Agrees to Admission and willing to participate: Yes (spoke with pt and his dtr on 6-25 and with pt's Stone/dtr on 6-26) Program Orientation Provided & Reviewed with Pt/Caregiver Including Roles  & Responsibilities: Yes   Decrease burden of Care through IP rehab admission: NA   Possible need for SNF placement upon discharge: not anticipated   Patient Condition: This patient's condition remains as documented in the consult dated 01-17-14, in which the Rehabilitation Physician determined and documented that the patient's condition is appropriate for intensive rehabilitative care in an inpatient rehabilitation facility. Will admit to inpatient rehab today.  Preadmission Screen Completed By:  Nanetta Batty, PT 01/18/2014 10:38 AM ______________________________________________________________________   Discussed status with Dr. Naaman Plummer on 01-18-14 at 1040 and received telephone approval for admission today.  Admission Coordinator:  Nanetta Batty, PT time 1040/Date 01-18-14

## 2014-01-18 ENCOUNTER — Inpatient Hospital Stay (HOSPITAL_COMMUNITY)
Admission: RE | Admit: 2014-01-18 | Discharge: 2014-02-02 | DRG: 945 | Disposition: A | Discharge status: Home Health | Payer: Medicare Other | Source: Intra-hospital | Attending: Physical Medicine & Rehabilitation | Admitting: Physical Medicine & Rehabilitation

## 2014-01-18 DIAGNOSIS — I6350 Cerebral infarction due to unspecified occlusion or stenosis of unspecified cerebral artery: Secondary | ICD-10-CM

## 2014-01-18 DIAGNOSIS — I6330 Cerebral infarction due to thrombosis of unspecified cerebral artery: Secondary | ICD-10-CM | POA: Diagnosis not present

## 2014-01-18 DIAGNOSIS — F329 Major depressive disorder, single episode, unspecified: Secondary | ICD-10-CM | POA: Diagnosis not present

## 2014-01-18 DIAGNOSIS — Z9181 History of falling: Secondary | ICD-10-CM | POA: Diagnosis not present

## 2014-01-18 DIAGNOSIS — S322XXA Fracture of coccyx, initial encounter for closed fracture: Secondary | ICD-10-CM

## 2014-01-18 DIAGNOSIS — J449 Chronic obstructive pulmonary disease, unspecified: Secondary | ICD-10-CM | POA: Diagnosis not present

## 2014-01-18 DIAGNOSIS — I2510 Atherosclerotic heart disease of native coronary artery without angina pectoris: Secondary | ICD-10-CM | POA: Diagnosis not present

## 2014-01-18 DIAGNOSIS — Z7982 Long term (current) use of aspirin: Secondary | ICD-10-CM | POA: Diagnosis not present

## 2014-01-18 DIAGNOSIS — I69928 Other speech and language deficits following unspecified cerebrovascular disease: Secondary | ICD-10-CM | POA: Diagnosis not present

## 2014-01-18 DIAGNOSIS — F0390 Unspecified dementia without behavioral disturbance: Secondary | ICD-10-CM

## 2014-01-18 DIAGNOSIS — F3289 Other specified depressive episodes: Secondary | ICD-10-CM

## 2014-01-18 DIAGNOSIS — R339 Retention of urine, unspecified: Secondary | ICD-10-CM | POA: Diagnosis not present

## 2014-01-18 DIAGNOSIS — Z79899 Other long term (current) drug therapy: Secondary | ICD-10-CM

## 2014-01-18 DIAGNOSIS — E1142 Type 2 diabetes mellitus with diabetic polyneuropathy: Secondary | ICD-10-CM | POA: Diagnosis not present

## 2014-01-18 DIAGNOSIS — Z794 Long term (current) use of insulin: Secondary | ICD-10-CM

## 2014-01-18 DIAGNOSIS — I10 Essential (primary) hypertension: Secondary | ICD-10-CM

## 2014-01-18 DIAGNOSIS — J4489 Other specified chronic obstructive pulmonary disease: Secondary | ICD-10-CM

## 2014-01-18 DIAGNOSIS — S3210XA Unspecified fracture of sacrum, initial encounter for closed fracture: Secondary | ICD-10-CM | POA: Diagnosis not present

## 2014-01-18 DIAGNOSIS — I63239 Cerebral infarction due to unspecified occlusion or stenosis of unspecified carotid arteries: Secondary | ICD-10-CM

## 2014-01-18 DIAGNOSIS — Z5189 Encounter for other specified aftercare: Principal | ICD-10-CM

## 2014-01-18 DIAGNOSIS — R29898 Other symptoms and signs involving the musculoskeletal system: Secondary | ICD-10-CM

## 2014-01-18 DIAGNOSIS — I6340 Cerebral infarction due to embolism of unspecified cerebral artery: Secondary | ICD-10-CM | POA: Diagnosis not present

## 2014-01-18 DIAGNOSIS — E785 Hyperlipidemia, unspecified: Secondary | ICD-10-CM

## 2014-01-18 DIAGNOSIS — K2100 Gastro-esophageal reflux disease with esophagitis, without bleeding: Secondary | ICD-10-CM

## 2014-01-18 DIAGNOSIS — K5900 Constipation, unspecified: Secondary | ICD-10-CM

## 2014-01-18 DIAGNOSIS — Z87891 Personal history of nicotine dependence: Secondary | ICD-10-CM

## 2014-01-18 DIAGNOSIS — R338 Other retention of urine: Secondary | ICD-10-CM

## 2014-01-18 DIAGNOSIS — W19XXXA Unspecified fall, initial encounter: Secondary | ICD-10-CM | POA: Diagnosis not present

## 2014-01-18 DIAGNOSIS — R531 Weakness: Secondary | ICD-10-CM

## 2014-01-18 DIAGNOSIS — E1149 Type 2 diabetes mellitus with other diabetic neurological complication: Secondary | ICD-10-CM

## 2014-01-18 DIAGNOSIS — I639 Cerebral infarction, unspecified: Secondary | ICD-10-CM | POA: Diagnosis present

## 2014-01-18 DIAGNOSIS — E119 Type 2 diabetes mellitus without complications: Secondary | ICD-10-CM | POA: Diagnosis not present

## 2014-01-18 LAB — CBC
HEMATOCRIT: 42.4 % (ref 39.0–52.0)
Hemoglobin: 14.3 g/dL (ref 13.0–17.0)
MCH: 30.6 pg (ref 26.0–34.0)
MCHC: 33.7 g/dL (ref 30.0–36.0)
MCV: 90.6 fL (ref 78.0–100.0)
Platelets: 123 10*3/uL — ABNORMAL LOW (ref 150–400)
RBC: 4.68 MIL/uL (ref 4.22–5.81)
RDW: 14 % (ref 11.5–15.5)
WBC: 7.4 10*3/uL (ref 4.0–10.5)

## 2014-01-18 LAB — GLUCOSE, CAPILLARY
GLUCOSE-CAPILLARY: 159 mg/dL — AB (ref 70–99)
GLUCOSE-CAPILLARY: 165 mg/dL — AB (ref 70–99)
Glucose-Capillary: 233 mg/dL — ABNORMAL HIGH (ref 70–99)
Glucose-Capillary: 235 mg/dL — ABNORMAL HIGH (ref 70–99)

## 2014-01-18 LAB — CREATININE, SERUM
CREATININE: 1.11 mg/dL (ref 0.50–1.35)
GFR, EST AFRICAN AMERICAN: 75 mL/min — AB (ref >90)
GFR, EST NON AFRICAN AMERICAN: 65 mL/min — AB (ref >90)

## 2014-01-18 MED ORDER — SERTRALINE HCL 100 MG PO TABS
200.0000 mg | ORAL_TABLET | Freq: Every day | ORAL | Status: DC
  Administered 2014-01-19 – 2014-02-02 (×15): 200 mg via ORAL
  Filled 2014-01-18 (×16): qty 2

## 2014-01-18 MED ORDER — ACETAMINOPHEN 325 MG PO TABS
325.0000 mg | ORAL_TABLET | ORAL | Status: DC | PRN
  Administered 2014-01-19 – 2014-01-22 (×2): 650 mg via ORAL
  Filled 2014-01-18 (×2): qty 2

## 2014-01-18 MED ORDER — BUPROPION HCL 100 MG PO TABS
100.0000 mg | ORAL_TABLET | Freq: Two times a day (BID) | ORAL | Status: DC
  Administered 2014-01-18 – 2014-02-02 (×30): 100 mg via ORAL
  Filled 2014-01-18 (×33): qty 1

## 2014-01-18 MED ORDER — ATORVASTATIN CALCIUM 20 MG PO TABS
20.0000 mg | ORAL_TABLET | Freq: Every day | ORAL | Status: DC
  Administered 2014-01-18 – 2014-02-01 (×15): 20 mg via ORAL
  Filled 2014-01-18 (×16): qty 1

## 2014-01-18 MED ORDER — DONEPEZIL HCL 10 MG PO TABS
10.0000 mg | ORAL_TABLET | Freq: Every day | ORAL | Status: DC
  Administered 2014-01-18 – 2014-02-01 (×15): 10 mg via ORAL
  Filled 2014-01-18 (×16): qty 1

## 2014-01-18 MED ORDER — INSULIN ASPART 100 UNIT/ML ~~LOC~~ SOLN
15.0000 [IU] | Freq: Three times a day (TID) | SUBCUTANEOUS | Status: DC
  Administered 2014-01-18 – 2014-01-31 (×37): 15 [IU] via SUBCUTANEOUS

## 2014-01-18 MED ORDER — HEPARIN SODIUM (PORCINE) 5000 UNIT/ML IJ SOLN
5000.0000 [IU] | Freq: Three times a day (TID) | INTRAMUSCULAR | Status: DC
  Administered 2014-01-18 – 2014-02-02 (×45): 5000 [IU] via SUBCUTANEOUS
  Filled 2014-01-18 (×49): qty 1

## 2014-01-18 MED ORDER — INSULIN ASPART 100 UNIT/ML ~~LOC~~ SOLN
0.0000 [IU] | Freq: Three times a day (TID) | SUBCUTANEOUS | Status: DC
  Administered 2014-01-18: 5 [IU] via SUBCUTANEOUS
  Administered 2014-01-19: 3 [IU] via SUBCUTANEOUS
  Administered 2014-01-19: 2 [IU] via SUBCUTANEOUS
  Administered 2014-01-19: 3 [IU] via SUBCUTANEOUS
  Administered 2014-01-20 (×2): 2 [IU] via SUBCUTANEOUS
  Administered 2014-01-20: 5 [IU] via SUBCUTANEOUS
  Administered 2014-01-21: 3 [IU] via SUBCUTANEOUS
  Administered 2014-01-21: 2 [IU] via SUBCUTANEOUS
  Administered 2014-01-22 (×3): 3 [IU] via SUBCUTANEOUS
  Administered 2014-01-23 (×2): 2 [IU] via SUBCUTANEOUS
  Administered 2014-01-23: 5 [IU] via SUBCUTANEOUS
  Administered 2014-01-24 (×2): 3 [IU] via SUBCUTANEOUS
  Administered 2014-01-25 (×2): 2 [IU] via SUBCUTANEOUS
  Administered 2014-01-26: 5 [IU] via SUBCUTANEOUS
  Administered 2014-01-26 – 2014-01-27 (×2): 3 [IU] via SUBCUTANEOUS
  Administered 2014-01-27: 2 [IU] via SUBCUTANEOUS
  Administered 2014-01-27 – 2014-01-28 (×2): 3 [IU] via SUBCUTANEOUS
  Administered 2014-01-28: 2 [IU] via SUBCUTANEOUS
  Administered 2014-01-28 – 2014-01-29 (×2): 5 [IU] via SUBCUTANEOUS
  Administered 2014-01-30: 8 [IU] via SUBCUTANEOUS
  Administered 2014-01-30: 3 [IU] via SUBCUTANEOUS
  Administered 2014-01-30 – 2014-02-01 (×2): 2 [IU] via SUBCUTANEOUS
  Administered 2014-02-01: 3 [IU] via SUBCUTANEOUS

## 2014-01-18 MED ORDER — FENOFIBRATE 160 MG PO TABS
160.0000 mg | ORAL_TABLET | Freq: Every day | ORAL | Status: DC
  Administered 2014-01-19 – 2014-02-02 (×15): 160 mg via ORAL
  Filled 2014-01-18 (×16): qty 1

## 2014-01-18 MED ORDER — HYDROCODONE-ACETAMINOPHEN 5-325 MG PO TABS
1.0000 | ORAL_TABLET | Freq: Four times a day (QID) | ORAL | Status: DC | PRN
  Administered 2014-01-18 – 2014-01-22 (×7): 1 via ORAL
  Filled 2014-01-18 (×7): qty 1

## 2014-01-18 MED ORDER — ALBUTEROL SULFATE (2.5 MG/3ML) 0.083% IN NEBU: 2.5000 mg | INHALATION_SOLUTION | RESPIRATORY_TRACT | Status: DC | PRN

## 2014-01-18 MED ORDER — INSULIN GLARGINE 100 UNIT/ML ~~LOC~~ SOLN
45.0000 [IU] | Freq: Two times a day (BID) | SUBCUTANEOUS | Status: DC
  Administered 2014-01-18 – 2014-01-28 (×20): 45 [IU] via SUBCUTANEOUS
  Filled 2014-01-18 (×23): qty 0.45

## 2014-01-18 MED ORDER — LOSARTAN POTASSIUM 50 MG PO TABS
100.0000 mg | ORAL_TABLET | Freq: Every day | ORAL | Status: DC
  Administered 2014-01-19 – 2014-02-02 (×15): 100 mg via ORAL
  Filled 2014-01-18 (×16): qty 2

## 2014-01-18 MED ORDER — ONDANSETRON HCL 4 MG/2ML IJ SOLN: 4.0000 mg | Freq: Four times a day (QID) | INTRAMUSCULAR | Status: DC | PRN

## 2014-01-18 MED ORDER — SORBITOL 70 % SOLN
30.0000 mL | Freq: Every day | Status: DC | PRN
  Administered 2014-01-25: 30 mL via ORAL
  Administered 2014-01-30: 15 mL via ORAL
  Filled 2014-01-18 (×2): qty 30

## 2014-01-18 MED ORDER — QUETIAPINE FUMARATE 200 MG PO TABS
200.0000 mg | ORAL_TABLET | Freq: Every day | ORAL | Status: DC
  Administered 2014-01-18 – 2014-02-01 (×15): 200 mg via ORAL
  Filled 2014-01-18 (×18): qty 1

## 2014-01-18 MED ORDER — ONDANSETRON HCL 4 MG PO TABS: 4.0000 mg | ORAL_TABLET | Freq: Four times a day (QID) | ORAL | Status: DC | PRN

## 2014-01-18 MED ORDER — FENOFIBRATE 160 MG PO TABS: 160.0000 mg | ORAL_TABLET | Freq: Every day | ORAL | Status: DC

## 2014-01-18 MED ORDER — PANTOPRAZOLE SODIUM 40 MG PO TBEC
40.0000 mg | DELAYED_RELEASE_TABLET | Freq: Every day | ORAL | Status: DC
  Administered 2014-01-19 – 2014-02-02 (×15): 40 mg via ORAL
  Filled 2014-01-18 (×16): qty 1

## 2014-01-18 MED ORDER — SULINDAC 150 MG PO TABS
150.0000 mg | ORAL_TABLET | Freq: Two times a day (BID) | ORAL | Status: DC
Start: 2014-01-18 — End: 2014-02-02
  Administered 2014-01-18 – 2014-02-02 (×30): 150 mg via ORAL
  Filled 2014-01-18 (×32): qty 1

## 2014-01-18 MED ORDER — CLOPIDOGREL BISULFATE 75 MG PO TABS
75.0000 mg | ORAL_TABLET | Freq: Every day | ORAL | Status: DC
  Administered 2014-01-19 – 2014-02-02 (×15): 75 mg via ORAL
  Filled 2014-01-18 (×16): qty 1

## 2014-01-18 MED ORDER — HEPARIN SODIUM (PORCINE) 5000 UNIT/ML IJ SOLN
5000.0000 [IU] | Freq: Three times a day (TID) | INTRAMUSCULAR | Status: DC
  Filled 2014-01-18 (×2): qty 1

## 2014-01-18 MED ORDER — AMLODIPINE BESYLATE 10 MG PO TABS
10.0000 mg | ORAL_TABLET | Freq: Every day | ORAL | Status: DC
  Administered 2014-01-19 – 2014-02-02 (×15): 10 mg via ORAL
  Filled 2014-01-18 (×16): qty 1

## 2014-01-18 MED ORDER — METOPROLOL TARTRATE 50 MG PO TABS
50.0000 mg | ORAL_TABLET | Freq: Two times a day (BID) | ORAL | Status: DC
  Administered 2014-01-18 – 2014-01-21 (×7): 50 mg via ORAL
  Filled 2014-01-18 (×10): qty 1

## 2014-01-18 MED ORDER — PNEUMOCOCCAL VAC POLYVALENT 25 MCG/0.5ML IJ INJ
0.5000 mL | INJECTION | INTRAMUSCULAR | Status: AC
Start: 2014-01-19 — End: 2014-01-19
  Administered 2014-01-19: 0.5 mL via INTRAMUSCULAR
  Filled 2014-01-18: qty 0.5

## 2014-01-18 MED ORDER — CLOPIDOGREL BISULFATE 75 MG PO TABS: 75.0000 mg | ORAL_TABLET | Freq: Every day | ORAL | Status: DC

## 2014-01-18 MED ORDER — LORATADINE 10 MG PO TABS
10.0000 mg | ORAL_TABLET | Freq: Every day | ORAL | Status: DC
  Administered 2014-01-19 – 2014-02-02 (×15): 10 mg via ORAL
  Filled 2014-01-18 (×16): qty 1

## 2014-01-18 MED ORDER — GABAPENTIN 600 MG PO TABS
600.0000 mg | ORAL_TABLET | Freq: Three times a day (TID) | ORAL | Status: DC
  Administered 2014-01-18 – 2014-02-02 (×44): 600 mg via ORAL
  Filled 2014-01-18 (×47): qty 1

## 2014-01-18 NOTE — H&P (Signed)
Physical Medicine and Rehabilitation Admission H&P  Chief Complaint   Patient presents with   .  Fall   :  HPI: Maurice Stone is a 73 y.o. right handed male with history of hypertension, diabetes mellitus, CAD,COPD as well as CVA 6 years ago with some residual slurred speech maintained on aspirin. Patient lives alone independent prior to admission. Presented 01/15/2014 with frequent falls and right-sided weakness and slurred speech. MRI shows acute nonhemorrhagic left paramedian pontine infarct as well as remote infarct left basal ganglia. MRA of the head without stenosis or occlusion. Echocardiogram with ejection fraction 38% grade 1 diastolic dysfunction. Carotid Dopplers no ICA stenosis. Patient did not receive TPA. Neurology services consulted aspirin increased to 325 mg daily as well as the addition of subcutaneous heparin for DVT prophylaxis. Hemoglobin A1c of 10.6 with insulin therapy as directed. Patient is tolerating a regular consistency diet. Physical therapy evaluation completed 01/16/2014 with recommendations of physical medicine rehabilitation consult. Patient was admitted for comprehensive rehabilitation program  ROS Review of Systems  Gastrointestinal: Positive for constipation.  Musculoskeletal: Positive for falls.  Neurological: Positive for speech change and weakness.  Psychiatric/Behavioral: Positive for memory loss/depression/insomnia.  All other systems reviewed and are negative  Past Medical History   Diagnosis  Date   .  Hypertension    .  Diabetes mellitus without complication    .  Coronary artery disease    .  COPD (chronic obstructive pulmonary disease)    .  Stroke     History reviewed. No pertinent past surgical history.  History reviewed. No pertinent family history.  Social History: reports that he quit smoking about 41 years ago. His smoking use included Cigarettes. He smoked 0.00 packs per day for 10 years. He does not have any smokeless tobacco history  on file. He reports that he drinks about .6 ounces of alcohol per week. He reports that he does not use illicit drugs.  Allergies:  Allergies   Allergen  Reactions   .  Lisinopril  Cough    Medications Prior to Admission   Medication  Sig  Dispense  Refill   .  albuterol (PROVENTIL HFA;VENTOLIN HFA) 108 (90 BASE) MCG/ACT inhaler  Inhale into the lungs every 6 (six) hours as needed for wheezing or shortness of breath.     Marland Kitchen  amLODipine (NORVASC) 10 MG tablet  Take 10 mg by mouth daily.     Marland Kitchen  aspirin 81 MG tablet  Take 81 mg by mouth daily.     Marland Kitchen  buPROPion (WELLBUTRIN) 100 MG tablet  Take 100 mg by mouth 2 (two) times daily.     .  carboxymethylcellulose (REFRESH PLUS) 0.5 % SOLN  1 drop 3 (three) times daily as needed.     .  Cholecalciferol 2000 UNITS CAPS  Take 4,000 Units by mouth daily.     Marland Kitchen  donepezil (ARICEPT) 10 MG tablet  Take 10 mg by mouth at bedtime.     .  gabapentin (NEURONTIN) 600 MG tablet  Take 600 mg by mouth 3 (three) times daily.     Marland Kitchen  HYDROcodone-acetaminophen (NORCO/VICODIN) 5-325 MG per tablet  Take 1 tablet by mouth every 6 (six) hours as needed for moderate pain.     Marland Kitchen  insulin aspart (NOVOLOG) 100 UNIT/ML injection  Inject 15 Units into the skin 3 (three) times daily with meals.     .  insulin glargine (LANTUS) 100 UNIT/ML injection  Inject 50 Units into the skin 2 (  two) times daily.     Marland Kitchen  loratadine (CLARITIN) 10 MG tablet  Take 10 mg by mouth daily.     Marland Kitchen  losartan (COZAAR) 100 MG tablet  Take 100 mg by mouth daily.     .  metoprolol (LOPRESSOR) 50 MG tablet  Take 50 mg by mouth 2 (two) times daily.     .  pantoprazole (PROTONIX) 40 MG tablet  Take 40 mg by mouth daily.     .  pravastatin (PRAVACHOL) 20 MG tablet  Take 20 mg by mouth daily.     .  QUEtiapine (SEROQUEL) 200 MG tablet  Take 200 mg by mouth at bedtime.     .  sertraline (ZOLOFT) 100 MG tablet  Take 200 mg by mouth daily.     .  sulindac (CLINORIL) 150 MG tablet  Take 150 mg by mouth 2 (two) times  daily.      Home:  Home Living  Family/patient expects to be discharged to:: Private residence  Living Arrangements: Alone  Additional Comments: pt lives alone and only have family avail PRN. Pt to benefit from CIR vs SNF.  Functional History:  Prior Function  Level of Independence: Needs assistance  Gait / Transfers Assistance Needed: pt was amb safely with RW until recently, now he reports multiple falls a day  ADL's / Homemaking Assistance Needed: was indep until recently  Functional Status:  Mobility:  Bed Mobility  Overal bed mobility: Needs Assistance  Bed Mobility: Supine to Sit  Supine to sit: Mod assist  General bed mobility comments: assist for R LE management and trunk elevation  Transfers  Overall transfer level: Needs assistance  Equipment used: Rolling walker (2 wheeled)  Transfers: Sit to/from [facility]  Sit to Stand: Min assist  Stand pivot transfers: Mod assist  General transfer comment: pt able to come to standing with minimal assist but when asked to marching in place pt required maxA to maintain balance due to R LE buckling during [place]. Pt required modA for for stand pvt due to R knee buckling, support provided at pelvis and R knee to prevent pt from falling  Ambulation/Gait  Ambulation/Gait assistance: (unsafe at this time)   ADL: min to mod assist  Cognition:  Cognition  Overall Cognitive Status: Within Functional Limits for tasks assessed  Orientation Level: Oriented X4  Cognition  Arousal/Alertness: Awake/alert  Behavior During Therapy: WFL for tasks assessed/performed  Overall Cognitive Status: Within Functional Limits for tasks assessed    Physical Exam:  Blood pressure 120/63, pulse 51, temperature 97.9 F (36.6 C), temperature source Oral, resp. rate 18, height 5' 10" (1.778 m), weight 89.585 kg (197 lb 8 oz), SpO2 97.00%.    Constitutional: He is oriented to person, place, and time.  HENT: oral mucosa pink, some white  debris on the tongue Head: Normocephalic. atraumatic Eyes: EOM are normal.  Neck: Normal range of motion. Neck supple. No thyromegaly present.  Cardiovascular: Normal rate and regular rhythm. No murmurs or rubs Respiratory: Effort normal and breath sounds normal. No respiratory distress. No wheezes or rales GI: Soft. Bowel sounds are normal. He exhibits no distension.  Neurological: He is alert and oriented to person, place, and time.  Mildly dysarthric but fully intelligible. He follows full commands. RUE 4/5 deltoid, bicep, tricep and grip.  RLE 3+ HF, KE, 4/5 ankle. Mild right facial weakness and tongue deviation persistent. No gross sensory deficits. +Pronator drift RUE. Normal insight and awareness.  Skin: Skin is warm  and dry.  Psych: pleasant and cooperative   Results for orders placed during the hospital encounter of 01/15/14 (from the past 48 hour(s))   ETHANOL Status: None    Collection Time    01/15/14 2:45 PM   Result  Value  Ref Range    Alcohol, Ethyl (B)  <11  0 - 11 mg/dL    Comment:      LOWEST DETECTABLE LIMIT FOR     SERUM ALCOHOL IS 11 mg/dL     FOR MEDICAL PURPOSES ONLY   PROTIME-INR Status: None    Collection Time    01/15/14 2:45 PM   Result  Value  Ref Range    Prothrombin Time  14.0  11.6 - 15.2 seconds    INR  1.10  0.00 - 1.49   APTT Status: None    Collection Time    01/15/14 2:45 PM   Result  Value  Ref Range    aPTT  27  24 - 37 seconds   CBC Status: Abnormal    Collection Time    01/15/14 2:45 PM   Result  Value  Ref Range    WBC  9.9  4.0 - 10.5 K/uL    RBC  4.77  4.22 - 5.81 MIL/uL    Hemoglobin  14.7  13.0 - 17.0 g/dL    HCT  42.6  39.0 - 52.0 %    MCV  89.3  78.0 - 100.0 fL    MCH  30.8  26.0 - 34.0 pg    MCHC  34.5  30.0 - 36.0 g/dL    RDW  13.7  11.5 - 15.5 %    Platelets  144 (*)  150 - 400 K/uL   DIFFERENTIAL Status: None    Collection Time    01/15/14 2:45 PM   Result  Value  Ref Range    Neutrophils Relative %  57  43 - 77 %     Neutro Abs  5.6  1.7 - 7.7 K/uL    Lymphocytes Relative  33  12 - 46 %    Lymphs Abs  3.3  0.7 - 4.0 K/uL    Monocytes Relative  9  3 - 12 %    Monocytes Absolute  0.9  0.1 - 1.0 K/uL    Eosinophils Relative  2  0 - 5 %    Eosinophils Absolute  0.2  0.0 - 0.7 K/uL    Basophils Relative  0  0 - 1 %    Basophils Absolute  0.0  0.0 - 0.1 K/uL   COMPREHENSIVE METABOLIC PANEL Status: Abnormal    Collection Time    01/15/14 2:45 PM   Result  Value  Ref Range    Sodium  140  137 - 147 mEq/L    Potassium  3.8  3.7 - 5.3 mEq/L    Chloride  101  96 - 112 mEq/L    CO2  25  19 - 32 mEq/L    Glucose, Bld  218 (*)  70 - 99 mg/dL    BUN  17  6 - 23 mg/dL    Creatinine, Ser  1.00  0.50 - 1.35 mg/dL    Calcium  9.6  8.4 - 10.5 mg/dL    Total Protein  7.5  6.0 - 8.3 g/dL    Albumin  4.0  3.5 - 5.2 g/dL    AST  43 (*)  0 - 37 U/L    ALT  36  0 - 53 U/L    Alkaline Phosphatase  117  39 - 117 U/L    Total Bilirubin  0.7  0.3 - 1.2 mg/dL    GFR calc non Af Amer  74 (*)  >90 mL/min    GFR calc Af Amer  85 (*)  >90 mL/min    Comment:  (NOTE)     The eGFR has been calculated using the CKD EPI equation.     This calculation has not been validated in all clinical situations.     eGFR's persistently <90 mL/min signify possible Chronic Kidney     Disease.   TROPONIN I Status: None    Collection Time    01/15/14 2:45 PM   Result  Value  Ref Range    Troponin I  <0.30  <0.30 ng/mL    Comment:      Due to the release kinetics of cTnI,     a negative result within the first hours     of the onset of symptoms does not rule out     myocardial infarction with certainty.     If myocardial infarction is still suspected,     repeat the test at appropriate intervals.   GLUCOSE, CAPILLARY Status: Abnormal    Collection Time    01/15/14 11:21 PM   Result  Value  Ref Range    Glucose-Capillary  366 (*)  70 - 99 mg/dL   URINE RAPID DRUG SCREEN (HOSP PERFORMED) Status: None    Collection Time    01/16/14  1:34 AM   Result  Value  Ref Range    Opiates  NONE DETECTED  NONE DETECTED    Cocaine  NONE DETECTED  NONE DETECTED    Benzodiazepines  NONE DETECTED  NONE DETECTED    Amphetamines  NONE DETECTED  NONE DETECTED    Tetrahydrocannabinol  NONE DETECTED  NONE DETECTED    Barbiturates  NONE DETECTED  NONE DETECTED    Comment:      DRUG SCREEN FOR MEDICAL PURPOSES     ONLY. IF CONFIRMATION IS NEEDED     FOR ANY PURPOSE, NOTIFY LAB     WITHIN 5 DAYS.         LOWEST DETECTABLE LIMITS     FOR URINE DRUG SCREEN     Drug Class Cutoff (ng/mL)     Amphetamine 1000     Barbiturate 200     Benzodiazepine 673     Tricyclics 419     Opiates 300     Cocaine 300     THC 50   URINALYSIS, ROUTINE W REFLEX MICROSCOPIC Status: Abnormal    Collection Time    01/16/14 1:34 AM   Result  Value  Ref Range    Color, Urine  YELLOW  YELLOW    APPearance  CLEAR  CLEAR    Specific Gravity, Urine  1.025  1.005 - 1.030    pH  5.5  5.0 - 8.0    Glucose, UA  >1000 (*)  NEGATIVE mg/dL    Hgb urine dipstick  NEGATIVE  NEGATIVE    Bilirubin Urine  NEGATIVE  NEGATIVE    Ketones, ur  NEGATIVE  NEGATIVE mg/dL    Protein, ur  NEGATIVE  NEGATIVE mg/dL    Urobilinogen, UA  1.0  0.0 - 1.0 mg/dL    Nitrite  NEGATIVE  NEGATIVE    Leukocytes, UA  NEGATIVE  NEGATIVE   URINE MICROSCOPIC-ADD ON Status: None  Collection Time    01/16/14 1:34 AM   Result  Value  Ref Range    Squamous Epithelial / LPF  RARE  RARE    WBC, UA  0-2  <3 WBC/hpf    RBC / HPF  0-2  <3 RBC/hpf    Bacteria, UA  RARE  RARE   GLUCOSE, CAPILLARY Status: Abnormal    Collection Time    01/16/14 6:03 AM   Result  Value  Ref Range    Glucose-Capillary  224 (*)  70 - 99 mg/dL   HEMOGLOBIN A1C Status: Abnormal    Collection Time    01/16/14 6:05 AM   Result  Value  Ref Range    Hemoglobin A1C  10.6 (*)  <5.7 %    Comment:  (NOTE)         According to the ADA Clinical Practice Recommendations for 2011, when     HbA1c is used as a screening  test:     >=6.5% Diagnostic of Diabetes Mellitus     (if abnormal result is confirmed)     5.7-6.4% Increased risk of developing Diabetes Mellitus     References:Diagnosis and Classification of Diabetes Mellitus,Diabetes     JQZE,0923,30(QTMAU 1):S62-S69 and Standards of Medical Care in     Diabetes - 2011,Diabetes Care,2011,34 (Suppl 1):S11-S61.    Mean Plasma Glucose  258 (*)  <117 mg/dL    Comment:  Performed at [facility]: Abnormal    Collection Time    01/16/14 6:05 AM   Result  Value  Ref Range    Cholesterol  164  0 - 200 mg/dL    Triglycerides  248 (*)  <150 mg/dL    HDL  40  >39 mg/dL    Total CHOL/HDL Ratio  4.1     VLDL  50 (*)  0 - 40 mg/dL    LDL Cholesterol  74  0 - 99 mg/dL    Comment:      Total Cholesterol/HDL:CHD Risk     Coronary Heart Disease Risk Table     Men Women     1/2 Average Risk 3.4 3.3     Average Risk 5.0 4.4     2 X Average Risk 9.6 7.1     3 X Average Risk 23.4 11.0         Use the calculated Patient Ratio     above and the CHD Risk Table     to determine the patient's CHD Risk.         ATP III CLASSIFICATION (LDL):     <100 mg/dL Optimal     100-129 mg/dL Near or Above     Optimal     130-159 mg/dL Borderline     160-189 mg/dL High     >190 mg/dL Very High   GLUCOSE, CAPILLARY Status: Abnormal    Collection Time    01/16/14 7:36 AM   Result  Value  Ref Range    Glucose-Capillary  228 (*)  70 - 99 mg/dL    Comment 1  Documented in Chart    GLUCOSE, CAPILLARY Status: Abnormal    Collection Time    01/16/14 11:11 AM   Result  Value  Ref Range    Glucose-Capillary  312 (*)  70 - 99 mg/dL    Comment 1  Documented in Chart    GLUCOSE, CAPILLARY Status: Abnormal    Collection Time    01/16/14 6:36 PM  Result  Value  Ref Range    Glucose-Capillary  121 (*)  70 - 99 mg/dL   GLUCOSE, CAPILLARY Status: Abnormal    Collection Time    01/16/14 10:50 PM   Result  Value  Ref Range    Glucose-Capillary  128  (*)  70 - 99 mg/dL   GLUCOSE, CAPILLARY Status: Abnormal    Collection Time    01/17/14 6:46 AM   Result  Value  Ref Range    Glucose-Capillary  100 (*)  70 - 99 mg/dL    Dg Lumbar Spine Complete  01/15/2014 CLINICAL DATA: Low back pain, continuous falls EXAM: LUMBAR SPINE - COMPLETE 4+ VIEW COMPARISON: None. FINDINGS: There are 5 non rib-bearing lumbar type vertebral bodies. There is a mild scoliotic curvature of the thoracolumbar spine with dominant inferior curvature convex to the left. There is mild straightening the expected lumbar lordosis. No anterolisthesis or retrolisthesis. Lumbar vertebral body heights are preserved. There is mild-to-moderate multilevel lumbar spine DDD, worse at T11-T12, L1-L2 and L5-S1 with disc space height loss, endplate irregularity and sclerosis. Limited visualization the bilateral SI joints is normal. Regional soft tissues appear normal. IMPRESSION: 1. No acute findings. 2. Mild-to-moderate multilevel lumbar spine DDD. Electronically Signed By: Sandi Mariscal M.D. On: 01/15/2014 15:26  Ct Head Wo Contrast  01/15/2014 CLINICAL DATA: Post fall, now with injury in weakness EXAM: CT HEAD WITHOUT CONTRAST TECHNIQUE: Contiguous axial images were obtained from the base of the skull through the vertex without intravenous contrast. COMPARISON: None. FINDINGS: There is an old lacunar infarct within the left the coronal radiata (image 19, series 2). Scattered minimal periventricular hypodensities compatible with microvascular ischemic disease. The gray-white differentiation is otherwise well maintained without CT evidence of acute large territory infarct. No intraparenchymal or extra-axial mass or hemorrhage. Normal size and configuration of the ventricles and basilar cisterns. No midline shift. Limited visualization of the paranasal sinuses and mastoid air cells are normal. No air-fluid levels. Intracranial atherosclerosis. Regional soft tissues appear normal. No displaced calvarial  fracture. 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 Electronically Signed By: Sandi Mariscal M.D. On: 01/15/2014 15:28  Mri Brain Without Contrast  01/16/2014 CLINICAL DATA: Increasing frequency of falls. Right leg weakness. Slurred speech and facial drooping. TIAs. EXAM: MRI HEAD WITHOUT CONTRAST MRA HEAD WITHOUT CONTRAST TECHNIQUE: Multiplanar, multiecho pulse sequences of the brain and surrounding structures were obtained without intravenous contrast. Angiographic images of the head were obtained using MRA technique without contrast. COMPARISON: CT head without contrast 01/15/2014. MRI brain 06/06/2000. FINDINGS: MRI HEAD FINDINGS Acute nonhemorrhagic left paramedian pontine infarct is evident. Associated T2 and FLAIR signal hyperintensity is associated. A remote infarct of the left basal ganglia and corona radiata is again noted. Mild to moderate periventricular and subcortical white matter changes are noted bilaterally. Flow is present in the major intracranial arteries. The globes and orbits are intact. The paranasal sinuses and mastoid air cells are clear. MRA HEAD FINDINGS Mild atherosclerotic irregularity is present within the cavernous carotid arteries bilaterally without significant stenosis. The A1 and M1 segments are normal. The anterior communicating artery is patent. Posterior communicating arteries are present bilaterally. The MCA bifurcations are intact. There is some attenuation of MCA branch vessels bilaterally. The left vertebral artery is dominant. The PICA vessels are not seen. Dominant AICA vessels are noted bilaterally. The basilar artery demonstrates mild atherosclerotic change without significant stenosis. Both posterior cerebral arteries originate from the basilar tip. There is a moderate stenosis of the proximal right posterior cerebral  artery. There is some attenuation of PCA branch vessels bilaterally. IMPRESSION: 1. Acute nonhemorrhagic left paramedian  pontine infarct. 2. Remote infarct of the left basal ganglia and coronal radiata. 3. Mild to moderate periventricular and subcortical white matter disease bilaterally likely reflects the sequela of chronic microvascular ischemia. 4. Atherosclerotic irregularities within the cavernous carotid arteries bilaterally as well as the basilar artery without a significant stenosis relative to the distal vessels. 5. Moderate stenosis of the proximal right P1 segment. These results were called by telephone at the time of interpretation on 01/16/2014 at 5:54 PM to Dr. Candiss Norse, who verbally acknowledged these results. Electronically Signed By: Lawrence Santiago M.D. On: 01/16/2014 17:54  Mr Lumbar Spine Wo Contrast  01/16/2014 CLINICAL DATA: Fall. Low back pain. Left lower extremity weakness. EXAM: MRI LUMBAR SPINE WITHOUT CONTRAST TECHNIQUE: Multiplanar, multisequence MR imaging of the lumbar spine was performed. No intravenous contrast was administered. COMPARISON: Lumbar spine radiographs 01/15/2014. FINDINGS: Normal signal is present in the conus medullaris which terminates at T12, within normal limits. Slight retrolisthesis at L1-2 is degenerative. Alignment is otherwise anatomic. Marrow signal, vertebral body heights, and alignment are normal without evidence for acute fracture. A 16 mm cystic lesion of the right kidney is incompletely imaged. A 13 mm parenchymal cyst is noted at the lower pole of the left kidney. Limited imaging of the abdomen is otherwise unremarkable. L1-2: Lateral disc protrusions are present bilaterally. Mild lateral recess narrowing is evident. The foramina are patent. L2-3: A far left lateral disc protrusion and annular fissure are noted. This results in mild left lateral recess and foraminal stenosis. L3-4: A broad-based disc protrusion is present. Mild facet hypertrophy and short pedicles contribute to mild lateral recess and foraminal stenosis bilaterally. L4-5: A right paramedian disc protrusion is  present. Mild facet hypertrophy and short pedicles are noted. Moderate lateral recess and foraminal stenosis is worse on the right. L5-S1: A broad-based disc protrusion is present. Facet hypertrophy and short pedicles contribute to mild lateral recess and foraminal stenosis bilaterally. IMPRESSION: 1. 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 2. No acute fracture. 3. Cystic lesions in the kidneys bilaterally. 4. Mild lateral recess narrowing bilaterally at L1-2. 5. Mild left lateral recess and foraminal stenosis at L2-3 due to a far left lateral disc protrusion. 6. Mild lateral recess and foraminal narrowing bilaterally at L3-4 due to a broad-based disc protrusion, facet hypertrophy, and short pedicles. 7. A right paramedian disc protrusion at L4-5 leads to moderate lateral recess and foraminal stenosis, right greater than left. 8. Mild lateral recess and foraminal stenosis at L5-S1. Electronically Signed By: Lawrence Santiago M.D. On: 01/16/2014 18:35  Mr Jodene Nam Head/brain Wo Cm  01/16/2014 CLINICAL DATA: Increasing frequency of falls. Right leg weakness. Slurred speech and facial drooping. TIAs. EXAM: MRI HEAD WITHOUT CONTRAST MRA HEAD WITHOUT CONTRAST TECHNIQUE: Multiplanar, multiecho pulse sequences of the brain and surrounding structures were obtained without intravenous contrast. Angiographic images of the head were obtained using MRA technique without contrast. COMPARISON: CT head without contrast 01/15/2014. MRI brain 06/06/2000. FINDINGS: MRI HEAD FINDINGS Acute nonhemorrhagic left paramedian pontine infarct is evident. Associated T2 and FLAIR signal hyperintensity is associated. A remote infarct of the left basal ganglia and corona radiata is again noted. Mild to moderate periventricular and subcortical white matter changes are noted bilaterally. Flow is present in the major intracranial arteries. The globes and orbits are intact. The paranasal sinuses and mastoid air cells are clear.  MRA HEAD FINDINGS Mild atherosclerotic irregularity is present within  the cavernous carotid arteries bilaterally without significant stenosis. The A1 and M1 segments are normal. The anterior communicating artery is patent. Posterior communicating arteries are present bilaterally. The MCA bifurcations are intact. There is some attenuation of MCA branch vessels bilaterally. The left vertebral artery is dominant. The PICA vessels are not seen. Dominant AICA vessels are noted bilaterally. The basilar artery demonstrates mild atherosclerotic change without significant stenosis. Both posterior cerebral arteries originate from the basilar tip. There is a moderate stenosis of the proximal right posterior cerebral artery. There is some attenuation of PCA branch vessels bilaterally. IMPRESSION: 1. Acute nonhemorrhagic left paramedian pontine infarct. 2. Remote infarct of the left basal ganglia and coronal radiata. 3. Mild to moderate periventricular and subcortical white matter disease bilaterally likely reflects the sequela of chronic microvascular ischemia. 4. Atherosclerotic irregularities within the cavernous carotid arteries bilaterally as well as the basilar artery without a significant stenosis relative to the distal vessels. 5. Moderate stenosis of the proximal right P1 segment. These results were called by telephone at the time of interpretation on 01/16/2014 at 5:54 PM to Dr. Candiss Norse, who verbally acknowledged these results. Electronically Signed By: Lawrence Santiago M.D. On: 01/16/2014 17:54   Medical Problem List and Plan:  1. Functional deficits secondary to acute left paramedian pontine infarct  2. DVT Prophylaxis/Anticoagulation: Subcutaneous heparin. Monitor platelet counts and any signs of bleeding.  3. Pain Management: Clinoril 150 mg twice a day, Neurontin 600 mg 3 times a day, Hydrocodone as needed. Monitor with increased mobility  4. Mood/memory loss/depression: Wellbutrin 100 mg twice a day. Aricept 10  mg each bedtime, Seroquel 200 mg each bedtime, Zoloft 200 mg daily.  5. Neuropsych: This patient is capable of making decisions on his own behalf.  6. Hypertension. Norvasc 10 mg daily, Cozaar 100 mg daily, Lopressor 50 mg twice a day. Monitor with increased mobility  7. Diabetes mellitus with peripheral neuropathy. Hemoglobin A1c 10.6. Lantus insulin 40 units twice a day, NovoLog 12 units 3 times a day. Check blood sugars a.c. and at bedtime. Provide diabetic teaching  8. Hyperlipidemia. Lipitor    Post Admission Physician Evaluation:  1. Functional deficits secondary to left paramedian pontine infarct. 2. Patient is admitted to receive collaborative, interdisciplinary care between the physiatrist, rehab nursing staff, and therapy team. 3. Patient's level of medical complexity and substantial therapy needs in context of that medical necessity cannot be provided at a lesser intensity of care such as a SNF. 4. Patient has experienced substantial functional loss from his/her baseline which was documented above under the "Functional History" and "Functional Status" headings. Judging by the patient's diagnosis, physical exam, and functional history, the patient has potential for functional progress which will result in measurable gains while on inpatient rehab. These gains will be of substantial and practical use upon discharge in facilitating mobility and self-care at the household level. 5. Physiatrist will provide 24 hour management of medical needs as well as oversight of the therapy plan/treatment and provide guidance as appropriate regarding the interaction of the two. 6. 24 hour rehab nursing will assist with bladder management, bowel management, safety, skin/wound care, disease management, medication administration, pain management and patient education and help integrate therapy concepts, techniques,education, etc. 7. PT will assess and treat for/with: Lower extremity strength, range of motion,  stamina, balance, functional mobility, safety, adaptive techniques and equipment, NMR, stroke education, cognitive perceptual awareness. Goals are: mod I. 8. OT will assess and treat for/with: ADL's, functional mobility, safety, upper extremity strength, adaptive techniques and  equipment, NMR, stroke education, community reintegration, cognitive perceptual awareness. Goals are: mod I. 9. SLP will assess and treat for/with: speech, communication. Goals are: mod I. 10. Case Management and Social Worker will assess and treat for psychological issues and discharge planning. 11. Team conference will be held weekly to assess progress toward goals and to determine barriers to discharge. 12. Patient will receive at least 3 hours of therapy per day at least 5 days per week. 13. ELOS: 10-14 days  14. Prognosis: excellent   Meredith Staggers, MD, [facility]   01/17/2014

## 2014-01-18 NOTE — Progress Notes (Signed)
Pt transferred to rehab from [facility]. Pt and daughter orientated to Rehab expectations,  includimg therapy schedule, meal time, etc. Diagnostic specific information provided. Pt verbalized understanding.

## 2014-01-18 NOTE — Discharge Instructions (Signed)
Follow with Primary MD  in 7 days   Get CBC, CMP  checked  by Primary MD next visit.    Activity: As tolerated with Full fall precautions use walker/cane & assistance as needed   Disposition CIR   Diet: Heart Healthy Low carb.  For Heart failure patients - Check your Weight same time everyday, if you gain over 2 pounds, or you develop in leg swelling, experience more shortness of breath or chest pain, call your Primary MD immediately. Follow Cardiac Low Salt Diet and 1.8 lit/day fluid restriction.   On your next visit with her primary care physician please Get Medicines reviewed and adjusted.  Please request your Prim.MD to go over all Hospital Tests and Procedure/Radiological results at the follow up, please get all Hospital records sent to your Prim MD by signing hospital release before you go home.   If you experience worsening of your admission symptoms, develop shortness of breath, life threatening emergency, suicidal or homicidal thoughts you must seek medical attention immediately by calling 911 or calling your MD immediately  if symptoms less severe.  You Must read complete instructions/literature along with all the possible adverse reactions/side effects for all the Medicines you take and that have been prescribed to you. Take any new Medicines after you have completely understood and accpet all the possible adverse reactions/side effects.   Do not drive, operating heavy machinery, perform activities at heights, swimming or participation in water activities or provide baby sitting services if your were admitted for syncope or siezures until you have seen by Primary MD or a Neurologist and advised to do so again.  Do not drive when taking Pain medications.    Do not take more than prescribed Pain, Sleep and Anxiety Medications  Special Instructions: If you have smoked or chewed Tobacco  in the last 2 yrs please stop smoking, stop any regular Alcohol  and or any Recreational  drug use.  Wear Seat belts while driving.   Please note  You were cared for by a hospitalist during your hospital stay. If you have any questions about your discharge medications or the care you received while you were in the hospital after you are discharged, you can call the unit and asked to speak with the hospitalist on call if the hospitalist that took care of you is not available. Once you are discharged, your primary care physician will handle any further medical issues. Please note that NO REFILLS for any discharge medications will be authorized once you are discharged, as it is imperative that you return to your primary care physician (or establish a relationship with a primary care physician if you do not have one) for your aftercare needs so that they can reassess your need for medications and monitor your lab values.

## 2014-01-18 NOTE — Discharge Summary (Signed)
Maurice Stone, is a 73 y.o. male  DOB January 15, 1941  MRN [id number].  Admission date:  01/15/2014  Admitting Physician  Thurnell Lose, MD  Discharge Date:  01/18/2014   Primary MD  No primary provider on file.  Recommendations for primary care physician for things to follow:   Monitor secondary factors for stroke including A1c and glycemic control   Admission Diagnosis  CVA (cerebral vascular accident) [434.91]   Discharge Diagnosis  CVA (cerebral vascular accident) [434.91]    Principal Problem:   Acute CVA (cerebrovascular accident) Active Problems:   Essential hypertension, benign   Bradycardia   chronic Right sided weakness   GERD (gastroesophageal reflux disease)   Neuropathy      Past Medical History  Diagnosis Date  . Hypertension   . Diabetes mellitus without complication   . Coronary artery disease   . COPD (chronic obstructive pulmonary disease)   . Stroke     History reviewed. No pertinent past surgical history.   Discharge Condition: stable   Follow UP  Follow-up Information   Follow up with Xu,Jindong, MD. Schedule an appointment as soon as possible for a visit in 2 months. (Stroke Clinic)    Specialty:  Neurology   Contact information:   [address] [place] [address] [phone]       Follow up with [facility]    . Schedule an appointment as soon as possible for a visit in 1 week.   Contact information:   [place] [id number] [phone]        Discharge Instructions  and  Discharge Medications     Discharge Instructions   Discharge instructions    Complete by:  As directed   Follow with Primary MD  in 7 days   Get CBC, CMP  checked  by Primary MD next visit.    Activity: As tolerated with Full  fall precautions use walker/cane & assistance as needed   Disposition CIR   Diet: Heart Healthy Low carb.  For Heart failure patients - Check your Weight same time everyday, if you gain over 2 pounds, or you develop in leg swelling, experience more shortness of breath or chest pain, call your Primary MD immediately. Follow Cardiac Low Salt Diet and 1.8 lit/day fluid restriction.   On your next visit with her primary care physician please Get Medicines reviewed and adjusted.  Please request your Prim.MD to go over all Hospital Tests and Procedure/Radiological results at the follow up, please get all Hospital records sent to your Prim MD by signing hospital release before you go home.   If you experience worsening of your admission symptoms, develop shortness of breath, life threatening emergency, suicidal or homicidal thoughts you must seek medical attention immediately by calling 911 or calling your MD immediately  if symptoms less severe.  You Must read complete instructions/literature along with all the possible adverse reactions/side effects for all the Medicines you take and that have been  prescribed to you. Take any new Medicines after you have completely understood and accpet all the possible adverse reactions/side effects.   Do not drive, operating heavy machinery, perform activities at heights, swimming or participation in water activities or provide baby sitting services if your were admitted for syncope or siezures until you have seen by Primary MD or a Neurologist and advised to do so again.  Do not drive when taking Pain medications.    Do not take more than prescribed Pain, Sleep and Anxiety Medications  Special Instructions: If you have smoked or chewed Tobacco  in the last 2 yrs please stop smoking, stop any regular Alcohol  and or any Recreational drug use.  Wear Seat belts while driving.   Please note  You were cared for by a hospitalist during your hospital stay.  If you have any questions about your discharge medications or the care you received while you were in the hospital after you are discharged, you can call the unit and asked to speak with the hospitalist on call if the hospitalist that took care of you is not available. Once you are discharged, your primary care physician will handle any further medical issues. Please note that NO REFILLS for any discharge medications will be authorized once you are discharged, as it is imperative that you return to your primary care physician (or establish a relationship with a primary care physician if you do not have one) for your aftercare needs so that they can reassess your need for medications and monitor your lab values.  Follow with Primary MD  in 7 days   Get CBC, CMP  checked  by Primary MD next visit.    Activity: As tolerated with Full fall precautions use walker/cane & assistance as needed   Disposition CIR   Diet: Heart Healthy Low carb.  For Heart failure patients - Check your Weight same time everyday, if you gain over 2 pounds, or you develop in leg swelling, experience more shortness of breath or chest pain, call your Primary MD immediately. Follow Cardiac Low Salt Diet and 1.8 lit/day fluid restriction.   On your next visit with her primary care physician please Get Medicines reviewed and adjusted.  Please request your Prim.MD to go over all Hospital Tests and Procedure/Radiological results at the follow up, please get all Hospital records sent to your Prim MD by signing hospital release before you go home.   If you experience worsening of your admission symptoms, develop shortness of breath, life threatening emergency, suicidal or homicidal thoughts you must seek medical attention immediately by calling 911 or calling your MD immediately  if symptoms less severe.  You Must read complete instructions/literature along with all the possible adverse reactions/side effects for all the Medicines  you take and that have been prescribed to you. Take any new Medicines after you have completely understood and accpet all the possible adverse reactions/side effects.   Do not drive, operating heavy machinery, perform activities at heights, swimming or participation in water activities or provide baby sitting services if your were admitted for syncope or siezures until you have seen by Primary MD or a Neurologist and advised to do so again.  Do not drive when taking Pain medications.    Do not take more than prescribed Pain, Sleep and Anxiety Medications  Special Instructions: If you have smoked or chewed Tobacco  in the last 2 yrs please stop smoking, stop any regular Alcohol  and or any Recreational drug use.  Wear Seat belts while driving.   Please note  You were cared for by a hospitalist during your hospital stay. If you have any questions about your discharge medications or the care you received while you were in the hospital after you are discharged, you can call the unit and asked to speak with the hospitalist on call if the hospitalist that took care of you is not available. Once you are discharged, your primary care physician will handle any further medical issues. Please note that NO REFILLS for any discharge medications will be authorized once you are discharged, as it is imperative that you return to your primary care physician (or establish a relationship with a primary care physician if you do not have one) for your aftercare needs so that they can reassess your need for medications and monitor your lab values.     Increase activity slowly    Complete by:  As directed             Medication List    STOP taking these medications       aspirin 81 MG tablet      TAKE these medications       albuterol 108 (90 BASE) MCG/ACT inhaler  Commonly known as:  PROVENTIL HFA;VENTOLIN HFA  Inhale into the lungs every 6 (six) hours as needed for wheezing or shortness of breath.       amLODipine 10 MG tablet  Commonly known as:  NORVASC  Take 10 mg by mouth daily.     buPROPion 100 MG tablet  Commonly known as:  WELLBUTRIN  Take 100 mg by mouth 2 (two) times daily.     carboxymethylcellulose 0.5 % Soln  Commonly known as:  REFRESH PLUS  1 drop 3 (three) times daily as needed.     Cholecalciferol 2000 UNITS Caps  Take 4,000 Units by mouth daily.     clopidogrel 75 MG tablet  Commonly known as:  PLAVIX  Take 1 tablet (75 mg total) by mouth daily.     donepezil 10 MG tablet  Commonly known as:  ARICEPT  Take 10 mg by mouth at bedtime.     fenofibrate 160 MG tablet  Take 1 tablet (160 mg total) by mouth daily.     gabapentin 600 MG tablet  Commonly known as:  NEURONTIN  Take 600 mg by mouth 3 (three) times daily.     HYDROcodone-acetaminophen 5-325 MG per tablet  Commonly known as:  NORCO/VICODIN  Take 1 tablet by mouth every 6 (six) hours as needed for moderate pain.     insulin aspart 100 UNIT/ML injection  Commonly known as:  novoLOG  Inject 15 Units into the skin 3 (three) times daily with meals.     insulin glargine 100 UNIT/ML injection  Commonly known as:  LANTUS  Inject 50 Units into the skin 2 (two) times daily.     loratadine 10 MG tablet  Commonly known as:  CLARITIN  Take 10 mg by mouth daily.     losartan 100 MG tablet  Commonly known as:  COZAAR  Take 100 mg by mouth daily.     metoprolol 50 MG tablet  Commonly known as:  LOPRESSOR  Take 50 mg by mouth 2 (two) times daily.     pantoprazole 40 MG tablet  Commonly known as:  PROTONIX  Take 40 mg by mouth daily.     pravastatin 20 MG tablet  Commonly known as:  PRAVACHOL  Take 20 mg by  mouth daily.     QUEtiapine 200 MG tablet  Commonly known as:  SEROQUEL  Take 200 mg by mouth at bedtime.     sertraline 100 MG tablet  Commonly known as:  ZOLOFT  Take 200 mg by mouth daily.     sulindac 150 MG tablet  Commonly known as:  CLINORIL  Take 150 mg by mouth 2 (two) times  daily.          Diet and Activity recommendation: See Discharge Instructions above   Consults obtained - Neuro   Major procedures and Radiology Reports - PLEASE review detailed and final reports for all details, in brief -   Carotids  Preliminary report: Bilateral: 1-39% ICA stenosis. Vertebral artery flow is antegrade.  TTE  - Left ventricle: The cavity size was normal. There was moderate concentric hypertrophy. Systolic function was normal. The estimated ejection fraction was in the range of 55% to 60%. Wall motion was normal; there were no regional wall motion abnormalities. Doppler parameters are consistent with abnormal left ventricular relaxation (grade 1 diastolic dysfunction). The E/e&' ratio is <8, suggesting normal LV filling pressure. - Left atrium: The atrium was normal in size.    Dg Lumbar Spine Complete  01/15/2014   CLINICAL DATA:  Low back pain, continuous falls  EXAM: LUMBAR SPINE - COMPLETE 4+ VIEW  COMPARISON:  None.  FINDINGS: There are 5 non rib-bearing lumbar type vertebral bodies.  There is a mild scoliotic curvature of the thoracolumbar spine with dominant inferior curvature convex to the left. There is mild straightening the expected lumbar lordosis. No anterolisthesis or retrolisthesis.  Lumbar vertebral body heights are preserved.  There is mild-to-moderate multilevel lumbar spine DDD, worse at T11-T12, L1-L2 and L5-S1 with disc space height loss, endplate irregularity and sclerosis.  Limited visualization the bilateral SI joints is normal.  Regional soft tissues appear normal.  IMPRESSION: 1. No acute findings. 2. Mild-to-moderate multilevel lumbar spine DDD.   Electronically Signed   By: Sandi Mariscal M.D.   On: 01/15/2014 15:26   Ct Head Wo Contrast  01/15/2014   CLINICAL DATA:  Post fall, now with injury in weakness  EXAM: CT HEAD WITHOUT CONTRAST  TECHNIQUE: Contiguous axial images were obtained from the base of the skull through the vertex without  intravenous contrast.  COMPARISON:  None.  FINDINGS: There is an old lacunar infarct within the left the coronal radiata (image 19, series 2). Scattered minimal periventricular hypodensities compatible with microvascular ischemic disease. The gray-white differentiation is otherwise well maintained without CT evidence of acute large territory infarct. No intraparenchymal or extra-axial mass or hemorrhage. Normal size and configuration of the ventricles and basilar cisterns. No midline shift. Limited visualization of the paranasal sinuses and mastoid air cells are normal. No air-fluid levels. Intracranial atherosclerosis. Regional soft tissues appear normal. No displaced calvarial fracture.  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   Electronically Signed   By: Sandi Mariscal M.D.   On: 01/15/2014 15:28   Mri Brain Without Contrast  01/16/2014   CLINICAL DATA:  Increasing frequency of falls. Right leg weakness. Slurred speech and facial drooping. TIAs.  EXAM: MRI HEAD WITHOUT CONTRAST  MRA HEAD WITHOUT CONTRAST  TECHNIQUE: Multiplanar, multiecho pulse sequences of the brain and surrounding structures were obtained without intravenous contrast. Angiographic images of the head were obtained using MRA technique without contrast.  COMPARISON:  CT head without contrast 01/15/2014. MRI brain 06/06/2000.  FINDINGS: MRI HEAD FINDINGS  Acute nonhemorrhagic left paramedian pontine  infarct is evident. Associated T2 and FLAIR signal hyperintensity is associated. A remote infarct of the left basal ganglia and corona radiata is again noted. Mild to moderate periventricular and subcortical white matter changes are noted bilaterally.  Flow is present in the major intracranial arteries. The globes and orbits are intact. The paranasal sinuses and mastoid air cells are clear.  MRA HEAD FINDINGS  Mild atherosclerotic irregularity is present within the cavernous carotid arteries bilaterally without  significant stenosis. The A1 and M1 segments are normal. The anterior communicating artery is patent. Posterior communicating arteries are present bilaterally. The MCA bifurcations are intact. There is some attenuation of MCA branch vessels bilaterally.  The left vertebral artery is dominant. The PICA vessels are not seen. Dominant AICA vessels are noted bilaterally. The basilar artery demonstrates mild atherosclerotic change without significant stenosis. Both posterior cerebral arteries originate from the basilar tip. There is a moderate stenosis of the proximal right posterior cerebral artery. There is some attenuation of PCA branch vessels bilaterally.  IMPRESSION: 1. Acute nonhemorrhagic left paramedian pontine infarct. 2. Remote infarct of the left basal ganglia and coronal radiata. 3. Mild to moderate periventricular and subcortical white matter disease bilaterally likely reflects the sequela of chronic microvascular ischemia. 4. Atherosclerotic irregularities within the cavernous carotid arteries bilaterally as well as the basilar artery without a significant stenosis relative to the distal vessels. 5. Moderate stenosis of the proximal right P1 segment. These results were called by telephone at the time of interpretation on 01/16/2014 at 5:54 PM to Dr. Candiss Norse, who verbally acknowledged these results.   Electronically Signed   By: Lawrence Santiago M.D.   On: 01/16/2014 17:54   Mr Lumbar Spine Wo Contrast  01/16/2014   CLINICAL DATA:  Fall. Low back pain. Left lower extremity weakness.  EXAM: MRI LUMBAR SPINE WITHOUT CONTRAST  TECHNIQUE: Multiplanar, multisequence MR imaging of the lumbar spine was performed. No intravenous contrast was administered.  COMPARISON:  Lumbar spine radiographs 01/15/2014.  FINDINGS: Normal signal is present in the conus medullaris which terminates at T12, within normal limits. Slight retrolisthesis at L1-2 is degenerative. Alignment is otherwise anatomic. Marrow signal, vertebral  body heights, and alignment are normal without evidence for acute fracture. A 16 mm cystic lesion of the right kidney is incompletely imaged. A 13 mm parenchymal cyst is noted at the lower pole of the left kidney. Limited imaging of the abdomen is otherwise unremarkable.  L1-2: Lateral disc protrusions are present bilaterally. Mild lateral recess narrowing is evident. The foramina are patent.  L2-3: A far left lateral disc protrusion and annular fissure are noted. This results in mild left lateral recess and foraminal stenosis.  L3-4: A broad-based disc protrusion is present. Mild facet hypertrophy and short pedicles contribute to mild lateral recess and foraminal stenosis bilaterally.  L4-5: A right paramedian disc protrusion is present. Mild facet hypertrophy and short pedicles are noted. Moderate lateral recess and foraminal stenosis is worse on the right.  L5-S1: A broad-based disc protrusion is present. Facet hypertrophy and short pedicles contribute to mild lateral recess and foraminal stenosis bilaterally.  IMPRESSION: 1. 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 2. No acute fracture. 3. Cystic lesions in the kidneys bilaterally. 4. Mild lateral recess narrowing bilaterally at L1-2. 5. Mild left lateral recess and foraminal stenosis at L2-3 due to a far left lateral disc protrusion. 6. Mild lateral recess and foraminal narrowing bilaterally at L3-4 due to a broad-based disc protrusion, facet hypertrophy, and short pedicles. 7.  A right paramedian disc protrusion at L4-5 leads to moderate lateral recess and foraminal stenosis, right greater than left. 8. Mild lateral recess and foraminal stenosis at L5-S1.   Electronically Signed   By: Lawrence Santiago M.D.   On: 01/16/2014 18:35   Mr Jodene Nam Head/brain Wo Cm  01/16/2014   CLINICAL DATA:  Increasing frequency of falls. Right leg weakness. Slurred speech and facial drooping. TIAs.  EXAM: MRI HEAD WITHOUT CONTRAST  MRA HEAD WITHOUT CONTRAST   TECHNIQUE: Multiplanar, multiecho pulse sequences of the brain and surrounding structures were obtained without intravenous contrast. Angiographic images of the head were obtained using MRA technique without contrast.  COMPARISON:  CT head without contrast 01/15/2014. MRI brain 06/06/2000.  FINDINGS: MRI HEAD FINDINGS  Acute nonhemorrhagic left paramedian pontine infarct is evident. Associated T2 and FLAIR signal hyperintensity is associated. A remote infarct of the left basal ganglia and corona radiata is again noted. Mild to moderate periventricular and subcortical white matter changes are noted bilaterally.  Flow is present in the major intracranial arteries. The globes and orbits are intact. The paranasal sinuses and mastoid air cells are clear.  MRA HEAD FINDINGS  Mild atherosclerotic irregularity is present within the cavernous carotid arteries bilaterally without significant stenosis. The A1 and M1 segments are normal. The anterior communicating artery is patent. Posterior communicating arteries are present bilaterally. The MCA bifurcations are intact. There is some attenuation of MCA branch vessels bilaterally.  The left vertebral artery is dominant. The PICA vessels are not seen. Dominant AICA vessels are noted bilaterally. The basilar artery demonstrates mild atherosclerotic change without significant stenosis. Both posterior cerebral arteries originate from the basilar tip. There is a moderate stenosis of the proximal right posterior cerebral artery. There is some attenuation of PCA branch vessels bilaterally.  IMPRESSION: 1. Acute nonhemorrhagic left paramedian pontine infarct. 2. Remote infarct of the left basal ganglia and coronal radiata. 3. Mild to moderate periventricular and subcortical white matter disease bilaterally likely reflects the sequela of chronic microvascular ischemia. 4. Atherosclerotic irregularities within the cavernous carotid arteries bilaterally as well as the basilar artery  without a significant stenosis relative to the distal vessels. 5. Moderate stenosis of the proximal right P1 segment. These results were called by telephone at the time of interpretation on 01/16/2014 at 5:54 PM to Dr. Candiss Norse, who verbally acknowledged these results.   Electronically Signed   By: Lawrence Santiago M.D.   On: 01/16/2014 17:54    Micro Results      No results found for this or any previous visit (from the past 240 hour(s)).   History of present illness and  Hospital Course:     Kindly see H&P for history of present illness and admission details, please review complete Labs, Consult reports and Test reports for all details in brief Maurice Stone, is a 73 y.o. male, patient with history of  hypertension, diabetes mellitus, CAD, CVA, COPD and peripheral neuropathy was brought into the hospital after he entered a fall secondary to acute ischemic CVA.     1.CVA with MRI evidence of ischemic nonhemorrhagic left paramedian pontine infarct - causing acute on chronic right-sided weakness, stable on tele, noted A1c and lipid panel, noted MRI MRA brain, stable echogram & carotid duplex, per neurology was placed on Plavix for antiplatelet therapy, added TriCor for better triglyceride control, A1c was 10.6 will need close monitoring of glycemic control and insulin adjustment the    Lab Results   Component  Value  Date  HGBA1C  10.6*  01/16/2014      Lab Results   Component  Value  Date    CHOL  164  01/16/2014    HDL  40  01/16/2014    LDLCALC  74  01/16/2014    TRIG  248*  01/16/2014    CHOLHDL  4.1  01/16/2014      Carotids   Preliminary report: Bilateral: 1-39% ICA stenosis. Vertebral artery flow is antegrade.    TTE  - Left ventricle: The cavity size was normal. There was moderate concentric hypertrophy. Systolic function was normal. The estimated ejection fraction was in the range of 55% to 60%. Wall motion was normal; there were no regional wall motion abnormalities.  Doppler parameters are consistent with abnormal left ventricular relaxation (grade 1 diastolic dysfunction). The E/e&' ratio is <8, suggesting normal LV filling pressure. Left atrium: The atrium was normal in size.     2.DM2 - noted A1c at 10.6, is on twice a day Lantus, scheduled NovoLog can add sliding scale NovoLog q. a.c. at bedtime per CIR protocol.     3.HTN - continue present regimen which includes Norvasc, Cozaar and beta blocker.      4. Dyslipidemia. Continue statin and added TriCor for better control.      5. GERD on PPI continue.      6. Depression anxiety. On Zoloft Seroquel which will be continued.        7. Diabetic neuropathy. Supportive care.     8. Fall with low back pain. MRI L spine shows diffuse DJD - outpt N Surg follow up in 3-4 weeks.       Today   Subjective:   Maurice Stone today has no headache,no chest abdominal pain,no new weakness tingling or numbness, feels much better.  Objective:   Blood pressure 118/64, pulse 58, temperature 97.4 F (36.3 C), temperature source Oral, resp. rate 18, height 5\' 10"  (1.778 m), weight 89.585 kg (197 lb 8 oz), SpO2 96.00%.   Intake/Output Summary (Last 24 hours) at 01/18/14 1030 Last data filed at 01/17/14 1920  Gross per 24 hour  Intake    240 ml  Output    300 ml  Net    -60 ml    Exam Awake Alert, Oriented x 3, No new F.N deficits, Normal affect [place].AT,PERRAL Supple Neck,No JVD, No cervical lymphadenopathy appriciated.  Symmetrical Chest wall movement, Good air movement bilaterally, CTAB RRR,No Gallops,Rubs or new Murmurs, No Parasternal Heave +ve B.Sounds, Abd Soft, Non tender, No organomegaly appriciated, No rebound -guarding or rigidity. No Cyanosis, Clubbing or edema, No new Rash or bruise  Data Review   CBC w Diff: Lab Results  Component Value Date   WBC 9.9 01/15/2014   HGB 14.7 01/15/2014   HCT 42.6 01/15/2014   PLT 144* 01/15/2014   LYMPHOPCT 33 01/15/2014   MONOPCT 9  01/15/2014   EOSPCT 2 01/15/2014   BASOPCT 0 01/15/2014    CMP: Lab Results  Component Value Date   NA 140 01/15/2014   K 3.8 01/15/2014   CL 101 01/15/2014   CO2 25 01/15/2014   BUN 17 01/15/2014   CREATININE 1.00 01/15/2014   PROT 7.5 01/15/2014   ALBUMIN 4.0 01/15/2014   BILITOT 0.7 01/15/2014   ALKPHOS 117 01/15/2014   AST 43* 01/15/2014   ALT 36 01/15/2014  .  Lab Results  Component Value Date   HGBA1C 10.6* 01/16/2014    Lab Results  Component Value Date   CHOL 164  01/16/2014   HDL 40 01/16/2014   LDLCALC 74 01/16/2014   TRIG 248* 01/16/2014   CHOLHDL 4.1 01/16/2014    Total Time in preparing paper work, data evaluation and todays exam - 35 minutes  Thurnell Lose M.D on 01/18/2014 at 10:30 AM  [facility] Office  [phone]   **Disclaimer: This note may have been dictated with voice recognition software. Similar sounding words can inadvertently be transcribed and this note may contain transcription errors which may not have been corrected upon publication of note.**

## 2014-01-18 NOTE — Progress Notes (Signed)
Rehab admissions - I received medical clearance from Dr. Candiss Norse that pt is appropriate for CIR. Bed is available and will admit pt to CIR later today. I completed admission paperwork with pt and spoke with pt's son [name] by phone to share the plan. He is glad his father is coming to rehab. I also spoke with pt's dtr Santiago Glad in person and she is also glad for rehab.  I updated Hassan Rowan, case Freight forwarder, Magazine features editor. Please call me with any questions. Thanks.  Nanetta Batty, PT Rehabilitation Admissions Coordinator [phone]

## 2014-01-18 NOTE — Progress Notes (Signed)
Patient transferred to [id number].  Report called and given to RN.  Vital signs and assessments were stable.

## 2014-01-18 NOTE — Progress Notes (Signed)
Meredith Staggers, MD Physician Signed Physical Medicine and Rehabilitation Consult Note Service date: 01/17/2014 7:51 AM           Physical Medicine and Rehabilitation Consult Reason for Consult:CVA Referring Physician: [facility]     HPI: Maurice Stone is a 73 y.o. right handed male with history of hypertension, diabetes mellitus, CAD,COPD as well as CVA 6 years ago with some residual slurred speech maintained on aspirin. Patient lives alone independent prior to admission. Presented 01/15/2014 with frequent falls and right-sided weakness and slurred speech. MRI shows acute nonhemorrhagic left paramedian pontine infarct as well as remote infarct left basal ganglia. MRA of the head without stenosis or occlusion. Echocardiogram with ejection fraction 76% grade 1 diastolic dysfunction. Carotid Dopplers no ICA stenosis. Patient did not receive TPA. Neurology services consulted aspirin increased to 325 mg daily as well as the addition of subcutaneous heparin for DVT prophylaxis. Hemoglobin A1c of 10.6 with insulin therapy as directed. Patient is tolerating a regular consistency diet. Physical therapy evaluation completed 01/16/2014 with recommendations of physical medicine rehabilitation consult     Review of Systems  Gastrointestinal: Positive for constipation.  Musculoskeletal: Positive for falls.  Neurological: Positive for speech change and weakness.  Psychiatric/Behavioral: Positive for memory loss.  All other systems reviewed and are negative. Past Medical History   Diagnosis  Date   .  Hypertension     .  Diabetes mellitus without complication     .  Coronary artery disease     .  COPD (chronic obstructive pulmonary disease)     .  Stroke      History reviewed. No pertinent past surgical history. History reviewed. No pertinent family history. Social History: reports that he quit smoking about 41 years ago. His smoking use included Cigarettes. He smoked 0.00 packs per day for 10  years. He does not have any smokeless tobacco history on file. He reports that he drinks about .6 ounces of alcohol per week. He reports that he does not use illicit drugs. Allergies:   Allergies   Allergen  Reactions   .  Lisinopril  Cough    Medications Prior to Admission   Medication  Sig  Dispense  Refill   .  albuterol (PROVENTIL HFA;VENTOLIN HFA) 108 (90 BASE) MCG/ACT inhaler  Inhale into the lungs every 6 (six) hours as needed for wheezing or shortness of breath.         Marland Kitchen  amLODipine (NORVASC) 10 MG tablet  Take 10 mg by mouth daily.         Marland Kitchen  aspirin 81 MG tablet  Take 81 mg by mouth daily.         Marland Kitchen  buPROPion (WELLBUTRIN) 100 MG tablet  Take 100 mg by mouth 2 (two) times daily.         .  carboxymethylcellulose (REFRESH PLUS) 0.5 % SOLN  1 drop 3 (three) times daily as needed.         .  Cholecalciferol 2000 UNITS CAPS  Take 4,000 Units by mouth daily.         Marland Kitchen  donepezil (ARICEPT) 10 MG tablet  Take 10 mg by mouth at bedtime.         .  gabapentin (NEURONTIN) 600 MG tablet  Take 600 mg by mouth 3 (three) times daily.         Marland Kitchen  HYDROcodone-acetaminophen (NORCO/VICODIN) 5-325 MG per tablet  Take 1 tablet by mouth every 6 (six) hours as needed  for moderate pain.         Marland Kitchen  insulin aspart (NOVOLOG) 100 UNIT/ML injection  Inject 15 Units into the skin 3 (three) times daily with meals.         .  insulin glargine (LANTUS) 100 UNIT/ML injection  Inject 50 Units into the skin 2 (two) times daily.         Marland Kitchen  loratadine (CLARITIN) 10 MG tablet  Take 10 mg by mouth daily.         Marland Kitchen  losartan (COZAAR) 100 MG tablet  Take 100 mg by mouth daily.         .  metoprolol (LOPRESSOR) 50 MG tablet  Take 50 mg by mouth 2 (two) times daily.         .  pantoprazole (PROTONIX) 40 MG tablet  Take 40 mg by mouth daily.         .  pravastatin (PRAVACHOL) 20 MG tablet  Take 20 mg by mouth daily.         .  QUEtiapine (SEROQUEL) 200 MG tablet  Take 200 mg by mouth at bedtime.         .  sertraline  (ZOLOFT) 100 MG tablet  Take 200 mg by mouth daily.         .  sulindac (CLINORIL) 150 MG tablet  Take 150 mg by mouth 2 (two) times daily.            Home: Home Living Family/patient expects to be discharged to:: Private residence Living Arrangements: Alone Additional Comments: pt lives alone and only have family avail PRN. Pt to benefit from CIR vs SNF.   Functional History: Prior Function Level of Independence: Needs assistance Gait / Transfers Assistance Needed: pt was amb safely with RW until recently, now he reports multiple falls a day ADL's / Homemaking Assistance Needed: was indep until recently Functional Status:   Mobility: Bed Mobility Overal bed mobility: Needs Assistance Bed Mobility: Supine to Sit Supine to sit: Mod assist General bed mobility comments: assist for R LE management and trunk elevation Transfers Overall transfer level: Needs assistance Equipment used: Rolling walker (2 wheeled) Transfers: Sit to/from [facility] Sit to Stand: Min assist Stand pivot transfers: Mod assist General transfer comment: pt able to come to standing with minimal assist but when asked to marching in place pt required maxA to maintain balance due to R LE buckling during [place]. Pt required modA for for stand pvt due to R knee buckling, support provided at pelvis and R knee to prevent pt from falling Ambulation/Gait Ambulation/Gait assistance:  (unsafe at this time)   ADL:   Cognition: Cognition Overall Cognitive Status: Within Functional Limits for tasks assessed Orientation Level: Oriented X4 Cognition Arousal/Alertness: Awake/alert Behavior During Therapy: WFL for tasks assessed/performed Overall Cognitive Status: Within Functional Limits for tasks assessed   Blood pressure 108/65, pulse 43, temperature 98.1 F (36.7 C), temperature source Oral, resp. rate 20, height 5\' 10"  (1.778 m), weight 89.585 kg (197 lb 8 oz), SpO2 98.00%. Physical Exam   Constitutional: He is oriented to person, place, and time.  HENT:   Head: Normocephalic.  Eyes: EOM are normal.  Neck: Normal range of motion. Neck supple. No thyromegaly present.  Cardiovascular: Normal rate and regular rhythm.   Respiratory: Effort normal and breath sounds normal. No respiratory distress.  GI: Soft. Bowel sounds are normal. He exhibits no distension.  Neurological: He is alert and oriented to person, place, and time.  Mildly dysarthric but fully intelligible. He follows full commands. RUE 4/5 prox to distal. RLE 3+ HF, KE, 4/5 ankle. Mild right facial weakness and tongue deviation. No gross sensory deficits. +PN RUE. Normal insight and awareness.   Skin: Skin is warm and dry.     Results for orders placed during the hospital encounter of 01/15/14 (from the past 24 hour(s))   GLUCOSE, CAPILLARY     Status: Abnormal     Collection Time      01/16/14 11:11 AM       Result  Value  Ref Range     Glucose-Capillary  312 (*)  70 - 99 mg/dL     Comment 1  Documented in Chart      GLUCOSE, CAPILLARY     Status: Abnormal     Collection Time      01/16/14  6:36 PM       Result  Value  Ref Range     Glucose-Capillary  121 (*)  70 - 99 mg/dL   GLUCOSE, CAPILLARY     Status: Abnormal     Collection Time      01/16/14 10:50 PM       Result  Value  Ref Range     Glucose-Capillary  128 (*)  70 - 99 mg/dL   GLUCOSE, CAPILLARY     Status: Abnormal     Collection Time      01/17/14  6:46 AM       Result  Value  Ref Range     Glucose-Capillary  100 (*)  70 - 99 mg/dL    Dg Lumbar Spine Complete   01/15/2014   CLINICAL DATA:  Low back pain, continuous falls  EXAM: LUMBAR SPINE - COMPLETE 4+ VIEW  COMPARISON:  None.  FINDINGS: There are 5 non rib-bearing lumbar type vertebral bodies.  There is a mild scoliotic curvature of the thoracolumbar spine with dominant inferior curvature convex to the left. There is mild straightening the expected lumbar lordosis. No anterolisthesis or  retrolisthesis.  Lumbar vertebral body heights are preserved.  There is mild-to-moderate multilevel lumbar spine DDD, worse at T11-T12, L1-L2 and L5-S1 with disc space height loss, endplate irregularity and sclerosis.  Limited visualization the bilateral SI joints is normal.  Regional soft tissues appear normal.  IMPRESSION: 1. No acute findings. 2. Mild-to-moderate multilevel lumbar spine DDD.   Electronically Signed   By: Sandi Mariscal M.D.   On: 01/15/2014 15:26    Ct Head Wo Contrast   01/15/2014   CLINICAL DATA:  Post fall, now with injury in weakness  EXAM: CT HEAD WITHOUT CONTRAST  TECHNIQUE: Contiguous axial images were obtained from the base of the skull through the vertex without intravenous contrast.  COMPARISON:  None. FINDINGS: There is an old lacunar infarct within the left the coronal radiata (image 19, series 2). Scattered minimal periventricular hypodensities compatible with microvascular ischemic disease. The gray-white differentiation is otherwise well maintained without CT evidence of acute large territory infarct. No intraparenchymal or extra-axial mass or hemorrhage. Normal size and configuration of the ventricles and basilar cisterns. No midline shift. Limited visualization of the paranasal sinuses and mastoid air cells are normal. No air-fluid levels. Intracranial atherosclerosis. Regional soft tissues appear normal. No displaced calvarial fracture.  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   Electronically Signed   By: Sandi Mariscal M.D.   On: 01/15/2014 15:28    Mri Brain Without Contrast   01/16/2014   CLINICAL DATA:  Increasing frequency of falls. Right leg weakness. Slurred speech and facial drooping. TIAs.  EXAM: MRI HEAD WITHOUT CONTRAST  MRA HEAD WITHOUT CONTRAST  TECHNIQUE: Multiplanar, multiecho pulse sequences of the brain and surrounding structures were obtained without intravenous contrast. Angiographic images of the head were obtained  using MRA technique without contrast.  COMPARISON:  CT head without contrast 01/15/2014. MRI brain 06/06/2000.  FINDINGS: MRI HEAD FINDINGS  Acute nonhemorrhagic left paramedian pontine infarct is evident. Associated T2 and FLAIR signal hyperintensity is associated. A remote infarct of the left basal ganglia and corona radiata is again noted. Mild to moderate periventricular and subcortical white matter changes are noted bilaterally.  Flow is present in the major intracranial arteries. The globes and orbits are intact. The paranasal sinuses and mastoid air cells are clear.  MRA HEAD FINDINGS  Mild atherosclerotic irregularity is present within the cavernous carotid arteries bilaterally without significant stenosis. The A1 and M1 segments are normal. The anterior communicating artery is patent. Posterior communicating arteries are present bilaterally. The MCA bifurcations are intact. There is some attenuation of MCA branch vessels bilaterally.  The left vertebral artery is dominant. The PICA vessels are not seen. Dominant AICA vessels are noted bilaterally. The basilar artery demonstrates mild atherosclerotic change without significant stenosis. Both posterior cerebral arteries originate from the basilar tip. There is a moderate stenosis of the proximal right posterior cerebral artery. There is some attenuation of PCA branch vessels bilaterally.  IMPRESSION: 1. Acute nonhemorrhagic left paramedian pontine infarct. 2. Remote infarct of the left basal ganglia and coronal radiata. 3. Mild to moderate periventricular and subcortical white matter disease bilaterally likely reflects the sequela of chronic microvascular ischemia. 4. Atherosclerotic irregularities within the cavernous carotid arteries bilaterally as well as the basilar artery without a significant stenosis relative to the distal vessels. 5. Moderate stenosis of the proximal right P1 segment. These results were called by telephone at the time of  interpretation on 01/16/2014 at 5:54 PM to Dr. Candiss Norse, who verbally acknowledged these results.   Electronically Signed   By: Lawrence Santiago M.D.   On: 01/16/2014 17:54    Mr Lumbar Spine Wo Contrast   01/16/2014   CLINICAL DATA:  Fall. Low back pain. Left lower extremity weakness.  EXAM: MRI LUMBAR SPINE WITHOUT CONTRAST  TECHNIQUE: Multiplanar, multisequence MR imaging of the lumbar spine was performed. No intravenous contrast was administered.  COMPARISON:  Lumbar spine radiographs 01/15/2014.  FINDINGS: Normal signal is present in the conus medullaris which terminates at T12, within normal limits. Slight retrolisthesis at L1-2 is degenerative. Alignment is otherwise anatomic. Marrow signal, vertebral body heights, and alignment are normal without evidence for acute fracture. A 16 mm cystic lesion of the right kidney is incompletely imaged. A 13 mm parenchymal cyst is noted at the lower pole of the left kidney. Limited imaging of the abdomen is otherwise unremarkable.  L1-2: Lateral disc protrusions are present bilaterally. Mild lateral recess narrowing is evident. The foramina are patent.  L2-3: A far left lateral disc protrusion and annular fissure are noted. This results in mild left lateral recess and foraminal stenosis.  L3-4: A broad-based disc protrusion is present. Mild facet hypertrophy and short pedicles contribute to mild lateral recess and foraminal stenosis bilaterally.  L4-5: A right paramedian disc protrusion is present. Mild facet hypertrophy and short pedicles are noted. Moderate lateral recess and foraminal stenosis is worse on the right.  L5-S1: A broad-based disc protrusion is present. Facet hypertrophy and short pedicles contribute to mild  lateral recess and foraminal stenosis bilaterally.  IMPRESSION: 1. 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 2. No acute fracture. 3. Cystic lesions in the kidneys bilaterally. 4. Mild lateral recess narrowing bilaterally at  L1-2. 5. Mild left lateral recess and foraminal stenosis at L2-3 due to a far left lateral disc protrusion. 6. Mild lateral recess and foraminal narrowing bilaterally at L3-4 due to a broad-based disc protrusion, facet hypertrophy, and short pedicles. 7. A right paramedian disc protrusion at L4-5 leads to moderate lateral recess and foraminal stenosis, right greater than left. 8. Mild lateral recess and foraminal stenosis at L5-S1.   Electronically Signed   By: Lawrence Santiago M.D.   On: 01/16/2014 18:35    Mr Jodene Nam Head/brain Wo Cm   01/16/2014   CLINICAL DATA:  Increasing frequency of falls. Right leg weakness. Slurred speech and facial drooping. TIAs.  EXAM: MRI HEAD WITHOUT CONTRAST  MRA HEAD WITHOUT CONTRAST  TECHNIQUE: Multiplanar, multiecho pulse sequences of the brain and surrounding structures were obtained without intravenous contrast. Angiographic images of the head were obtained using MRA technique without contrast.  COMPARISON:  CT head without contrast 01/15/2014. MRI brain 06/06/2000.  FINDINGS: MRI HEAD FINDINGS  Acute nonhemorrhagic left paramedian pontine infarct is evident. Associated T2 and FLAIR signal hyperintensity is associated. A remote infarct of the left basal ganglia and corona radiata is again noted. Mild to moderate periventricular and subcortical white matter changes are noted bilaterally.  Flow is present in the major intracranial arteries. The globes and orbits are intact. The paranasal sinuses and mastoid air cells are clear.  MRA HEAD FINDINGS  Mild atherosclerotic irregularity is present within the cavernous carotid arteries bilaterally without significant stenosis. The A1 and M1 segments are normal. The anterior communicating artery is patent. Posterior communicating arteries are present bilaterally. The MCA bifurcations are intact. There is some attenuation of MCA branch vessels bilaterally.  The left vertebral artery is dominant. The PICA vessels are not seen. Dominant AICA  vessels are noted bilaterally. The basilar artery demonstrates mild atherosclerotic change without significant stenosis. Both posterior cerebral arteries originate from the basilar tip. There is a moderate stenosis of the proximal right posterior cerebral artery. There is some attenuation of PCA branch vessels bilaterally.  IMPRESSION: 1. Acute nonhemorrhagic left paramedian pontine infarct. 2. Remote infarct of the left basal ganglia and coronal radiata. 3. Mild to moderate periventricular and subcortical white matter disease bilaterally likely reflects the sequela of chronic microvascular ischemia. 4. Atherosclerotic irregularities within the cavernous carotid arteries bilaterally as well as the basilar artery without a significant stenosis relative to the distal vessels. 5. Moderate stenosis of the proximal right P1 segment. These results were called by telephone at the time of interpretation on 01/16/2014 at 5:54 PM to Dr. Candiss Norse, who verbally acknowledged these results.   Electronically Signed   By: Lawrence Santiago M.D.   On: 01/16/2014 17:54     Assessment/Plan: Diagnosis: acute left paramedian pontine infarct Does the need for close, 24 hr/day medical supervision in concert with the patient's rehab needs make it unreasonable for this patient to be served in a less intensive setting? Yes Co-Morbidities requiring supervision/potential complications: htn, gerd, neuropathy Due to bladder management, bowel management, safety, skin/wound care, disease management, medication administration and patient education, does the patient require 24 hr/day rehab nursing? Yes Does the patient require coordinated care of a physician, rehab nurse, PT (1-2 hrs/day, 5 days/week), OT (1-2 hrs/day, 5 days/week) and SLP (1-2 hrs/day, 5 days/week) to  address physical and functional deficits in the context of the above medical diagnosis(es)? Yes Addressing deficits in the following areas: balance, endurance, locomotion, strength,  transferring, bowel/bladder control, bathing, dressing, feeding, grooming, toileting, speech and psychosocial support Can the patient actively participate in an intensive therapy program of at least 3 hrs of therapy per day at least 5 days per week? Yes The potential for patient to make measurable gains while on inpatient rehab is excellent Anticipated functional outcomes upon discharge from inpatient rehab are modified independent  with PT, modified independent with OT, modified independent with SLP. Estimated rehab length of stay to reach the above functional goals is: 12-16 days Does the patient have adequate social supports to accommodate these discharge functional goals? Yes Anticipated D/C setting: Home Anticipated post D/C treatments: HH therapy and Outpatient therapy Overall Rehab/Functional Prognosis: excellent   RECOMMENDATIONS: This patient's condition is appropriate for continued rehabilitative care in the following setting: CIR Patient has agreed to participate in recommended program. Yes Note that insurance prior authorization may be required for reimbursement for recommended care.   Comment: Rehab Admissions Coordinator to follow up.   Thanks,   Meredith Staggers, MD, Mellody Drown         01/17/2014    Revision History...     Date/Time User Action   01/17/2014 9:56 AM Meredith Staggers, MD Sign   01/17/2014 8:10 AM Cathlyn Parsons, PA-C [place]  View Details Report   Routing History...     Date/Time From To Method   01/17/2014 9:56 AM Meredith Staggers, MD Meredith Staggers, MD In Basket

## 2014-01-18 NOTE — Clinical Documentation Improvement (Signed)
Possible Clinical Conditions? _______R side Hemiparesis _______Other Condition__________________ _______Cannot Clinically Determine   Supporting Information: Per 01/16/14 PT eval & progress notes: Pt presenting with R hemiparesis and significant falls risk.    Per 01/17/14 MD progress notes :CVA - with acute on chronic right-sided weakness   Thank You, Serena Colonel ,RN Clinical Documentation Specialist:  [employer]

## 2014-01-18 NOTE — Progress Notes (Signed)
[facility] Admission Coordinator Signed Physical Medicine and Rehabilitation PMR Pre-admission Service date: 01/17/2014 3:47 PM   PMR Admission Coordinator Pre-Admission Assessment  Patient: Maurice Stone is an 73 y.o., male  MRN: [id number]  DOB: Jun 05, 1941  Height: 5\' 10"  (177.8 cm)  Weight: 89.585 kg (197 lb 8 oz)  Insurance Information  HMO: PPO: PCP: IPA: 80/20: OTHER:  PRIMARY: [employer] Policy#: [id number] Subscriber: self  Benefits: Phone #: [phone]   SECONDARY: Medicare Part A only Policy#: [id number] Subscriber: self  CM Name: Phone#: Fax#:  Pre-Cert#: verified in Occupational psychologist: retired  Benefits: Phone #: Name:  Eff. Date: A: 07-27-07 Deduct: $1260 Out of Pocket Max: none Life Max: unlimited  CIR: 100% SNF: no SNF coverage through [facility]  Outpatient: no MC coverage Co-Pay:  Home Health: 100% Co-Pay: none, no visit limits  DME: no MC coverage Co-Pay:  Emergency Contact Information    Contact Information     Name  Relation  Home  Work  [place]  Son  [phone]   [phone]     Julienne Kass    [phone]        Current Medical History  Patient Admitting Diagnosis: acute left paramedian pontine infarct  History of Present Illness: Maurice Stone is a 73 y.o. right handed male with history of hypertension, diabetes mellitus, CAD,COPD as well as CVA 6 years ago with some residual slurred speech maintained on aspirin. Patient lives alone independent prior to admission. Presented 01/15/2014 with frequent falls and right-sided weakness and slurred speech. MRI shows acute nonhemorrhagic left paramedian pontine infarct as well as remote infarct left basal ganglia. MRA of the head without stenosis or occlusion. Echocardiogram with ejection fraction 81% grade 1 diastolic dysfunction. Carotid Dopplers no ICA stenosis. Patient did not receive TPA. Neurology services consulted aspirin increased to 325 mg daily as well as  the addition of subcutaneous heparin for DVT prophylaxis. Hemoglobin A1c of 10.6 with insulin therapy as directed. Patient is tolerating a regular consistency diet. Physical therapy evaluation completed 01/16/2014 with recommendations of physical medicine rehabilitation consult  NIH Total: 1  Past Medical History    Past Medical History    Diagnosis  Date    .  Hypertension     .  Diabetes mellitus without complication     .  Coronary artery disease     .  COPD (chronic obstructive pulmonary disease)     .  Stroke      Family History  family history is not on file.  Prior Rehab/Hospitalizations: Pt had previous L TKR at [facility] and follow up outpt PT.  Current Medications  Current facility-administered medications:albuterol (PROVENTIL) (2.5 MG/3ML) 0.083% nebulizer solution 2.5 mg, 2.5 mg, Nebulization, Q4H PRN, Berle Mull, MD; amLODipine (NORVASC) tablet 10 mg, 10 mg, Oral, Daily, Berle Mull, MD, 10 mg at 01/18/14 0958; atorvastatin (LIPITOR) tablet 20 mg, 20 mg, Oral, q1800, Thurnell Lose, MD, 20 mg at 01/17/14 1711  buPROPion [facility]) tablet 100 mg, 100 mg, Oral, BID, Berle Mull, MD, 100 mg at 01/18/14 8563; clopidogrel (PLAVIX) tablet 75 mg, 75 mg, Oral, Daily, Donzetta Starch, NP, 75 mg at 01/18/14 0959; donepezil (ARICEPT) tablet 10 mg, 10 mg, Oral, QHS, Berle Mull, MD, 10 mg at 01/17/14 2131; fenofibrate tablet 160 mg, 160 mg, Oral, Daily, Thurnell Lose, MD, 160 mg at 01/18/14 0959  gabapentin (NEURONTIN) tablet 600 mg, 600 mg, Oral, TID, Berle Mull, MD, 600 mg  at 01/18/14 0958; heparin injection 5,000 Units, 5,000 Units, Subcutaneous, 3 times per day, Berle Mull, MD, 5,000 Units at 01/18/14 6314; HYDROcodone-acetaminophen (NORCO/VICODIN) 5-325 MG per tablet 1 tablet, 1 tablet, Oral, Q6H PRN, Berle Mull, MD, 1 tablet at 01/18/14 0145  insulin aspart (novoLOG) injection 0-15 Units, 0-15 Units, Subcutaneous, TID WC, Berle Mull, MD, 5 Units at 01/17/14 1710; insulin  aspart (novoLOG) injection 0-5 Units, 0-5 Units, Subcutaneous, QHS, Berle Mull, MD, 2 Units at 01/17/14 2157; insulin aspart (novoLOG) injection 15 Units, 15 Units, Subcutaneous, TID WC, Thurnell Lose, MD, 15 Units at 01/18/14 0753  insulin glargine (LANTUS) injection 45 Units, 45 Units, Subcutaneous, BID, Thurnell Lose, MD, 45 Units at 01/18/14 1001; loratadine (CLARITIN) tablet 10 mg, 10 mg, Oral, Daily, Berle Mull, MD, 10 mg at 01/18/14 9702; losartan (COZAAR) tablet 100 mg, 100 mg, Oral, Daily, Berle Mull, MD, 100 mg at 01/17/14 1005; metoprolol (LOPRESSOR) tablet 50 mg, 50 mg, Oral, BID, Berle Mull, MD, 25 mg at 01/17/14 2133  pantoprazole (PROTONIX) EC tablet 40 mg, 40 mg, Oral, Daily, Berle Mull, MD, 40 mg at 01/18/14 6378; QUEtiapine (SEROQUEL) tablet 200 mg, 200 mg, Oral, QHS, Berle Mull, MD, 200 mg at 01/17/14 2131; sertraline (ZOLOFT) tablet 200 mg, 200 mg, Oral, Daily, Berle Mull, MD, 200 mg at 01/18/14 5885; sulindac (CLINORIL) tablet 150 mg, 150 mg, Oral, BID WC, Berle Mull, MD, 150 mg at 01/18/14 0277  Patients Current Diet: Carb Control  Precautions / Restrictions  Precautions  Precautions: Fall  Precaution Comments: R sided hemiparesis and R knee buckling  Restrictions  Weight Bearing Restrictions: No  Prior Activity Level  Community (5-7x/wk): pt got out everyday, independent prior to admit, 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, 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, English as a foreign language Devices/Equipment: None  Prior Functional Level  Prior Function  Level of Independence: Independent with assistive device(s)  Gait / Transfers Assistance Needed: pt was amb safely with RW until recently, now he reports multiple falls a day  ADL's / Homemaking Assistance Needed: was indep until recently  Current Functional Level    Cognition  Overall Cognitive Status: Impaired/Different from baseline  Current Attention Level: Sustained  Orientation Level: Oriented X4   Safety/Judgement: Decreased awareness of safety    Extremity Assessment  (includes Sensation/Coordination)      ADLs  Overall ADL's : Needs assistance/impaired  Eating/Feeding: Independent;Sitting  Grooming: Oral care;Sitting;Set up  Grooming Details (indicate cue type and reason): pt used RUE to hold toothpaste and LUE to twist lid off. Pt used RUE to brush teeth.  Upper Body Bathing: Set up;Sitting  Lower Body Bathing: Min guard;Sit to/from stand (RW)  Upper Body Dressing : Set up;Sitting  Lower Body Dressing: Min guard;Sit to/from stand (RW)  Lower Body Dressing Details (indicate cue type and reason): pt able to reach Bil feet when sitting to don/doff socks  Toilet Transfer: Stand-pivot;Moderate assistance;RW;BSC (mod (A) to prevent pt knee from buckling. )  Toileting- Clothing Manipulation and Hygiene: Min guard;Sit to/from stand (RW)  Functional mobility during ADLs: (unable to perform at this time due to R knee buckling)  General ADL Comments: Pt highly motivated and enthusastic about therapy session asking "When do we start?" Pt reports increased strength in RUE and reports that he has been using RUE for functional tasks to increase coordination and strength. Pt stood x2 with use of RW and took steps forward and backward to return to chair. Pt reports R knee is buckling when he shifts weight onto it.  Pt performed grooming activities in sitting and educated pt on sitting during LB ADLs and grooming tasks to increase safety.    Mobility  Overal bed mobility: Needs Assistance  Bed Mobility: Supine to Sit  Supine to sit: Min assist  General bed mobility comments: HOB elevated    Transfers  Overall transfer level: Needs assistance  Equipment used: Rolling walker (2 wheeled)  Transfers: Sit to/from Stand  Sit to Stand: Min guard  Stand pivot transfers: Mod assist  General transfer comment: muti-modal cueing for proper technique    Ambulation / Gait / Stairs / Wheelchair Mobility   Ambulation/Gait  Ambulation/Gait assistance: +2 safety/equipment;Max assist  Ambulation Distance (Feet): 17 Feet  Assistive device: Rolling walker (2 wheeled)  Gait Pattern/deviations: Step-through pattern;Narrow base of support;Ataxic  General Gait Details: Pt required 2nd person physical A to block R knee for first 4-5 feet of gait. Taking large steps out of BOS of RW. responded well to cueing for taking smaller "baby" steps, but needed constant cueing with each step. Impulsive and at times taking L UE off of RW. Difficulty WB through R UE and decreased grasp on RW. 2nd person for safety and to A with BSC and recliner as pt needs physical A to maintain standing.    Posture / Balance  Dynamic Sitting Balance  Sitting balance - Comments: pt with posterior lean with dynamic activity, esp LE LAQ    Special needs/care consideration  BiPAP/CPAP no (has been issued CPAP for home but does not use it; hasn't used it in over a year. "I feel claustrophobic when I used it.")  CPM no  Continuous Drip IV no  Dialysis no  Life Vest no  Oxygen no  Special Bed no  Trach Size no  Wound Vac (area) no  Skin -dry skin noted on pt's feet, small scrape on right cheek from recent fall  Bowel mgmt: last BM on 01-16-14  Bladder mgmt: currently using urinal  Diabetic mgmt - yes, managed at home with insulin    Previous Home Environment  Living Arrangements: Alone  Home Care Services: No  Additional Comments: Pt lives alone and has family available PRN. Pt would be excellent CIR candidate.  Discharge Living Setting  Plans for Discharge Living Setting: Patient's home  Type of Home at Discharge: Apartment  Discharge Home Layout: One level  Discharge Home Access: Level entry  Does the patient have any problems obtaining your medications?: No  Social/Family/Support Systems  Contact Information: dtr Santiago Glad is primary contact  Anticipated Caregiver: Santiago Glad and son [name]  Anticipated Ambulance person Information:  see above  Ability/Limitations of Caregiver: dtr does work during day, can help in evenings. Son [name] can help during day.  Caregiver Availability: 24/7 (son can help during day, dtr can help in pm)  Discharge Plan Discussed with Primary Caregiver: Yes (discussed with dtr Santiago Glad on 6-25 and son [name] and dtr on 6-26)  Is Caregiver In Agreement with Plan?: Yes  Does Caregiver/Family have Issues with Lodging/Transportation while Pt is in Rehab?: No  Goals/Additional Needs  Patient/Family Goal for Rehab: Mod indep. with PT, OT and SLP  Expected length of stay: 12-16 days  Cultural Considerations: Pt is a Jehovah's witness (no blood products)  Dietary Needs: carb modified  Equipment Needs: to be determined  Pt/Family Agrees to Admission and willing to participate: Yes (spoke with pt and his dtr on 6-25 and with pt's son/dtr on 6-26)  Program Orientation Provided & Reviewed with Pt/Caregiver Including Roles & Responsibilities:  Yes  Decrease burden of Care through IP rehab admission: NA  Possible need for SNF placement upon discharge: not anticipated  Patient Condition: This patient's condition remains as documented in the consult dated 01-17-14, in which the Rehabilitation Physician determined and documented that the patient's condition is appropriate for intensive rehabilitative care in an inpatient rehabilitation facility. Will admit to inpatient rehab today.  Preadmission Screen Completed By: Nanetta Batty, PT 01/18/2014 10:38 AM  ______________________________________________________________________  Discussed status with Dr. Naaman Plummer on 01-18-14 at 1040 and received telephone approval for admission today.  Admission Coordinator: Nanetta Batty, PT time 1040/Date 01-18-14    Cosigned by: Meredith Staggers, MD [01/18/2014 10:59 AM]

## 2014-01-19 ENCOUNTER — Inpatient Hospital Stay (HOSPITAL_COMMUNITY): Payer: Medicare Other | Admitting: Speech Pathology

## 2014-01-19 ENCOUNTER — Inpatient Hospital Stay (HOSPITAL_COMMUNITY): Payer: Medicare Other | Admitting: *Deleted

## 2014-01-19 ENCOUNTER — Inpatient Hospital Stay (HOSPITAL_COMMUNITY): Payer: Medicare Other | Admitting: Occupational Therapy

## 2014-01-19 DIAGNOSIS — K2100 Gastro-esophageal reflux disease with esophagitis, without bleeding: Secondary | ICD-10-CM

## 2014-01-19 DIAGNOSIS — I63239 Cerebral infarction due to unspecified occlusion or stenosis of unspecified carotid arteries: Secondary | ICD-10-CM

## 2014-01-19 DIAGNOSIS — I6350 Cerebral infarction due to unspecified occlusion or stenosis of unspecified cerebral artery: Secondary | ICD-10-CM

## 2014-01-19 DIAGNOSIS — I10 Essential (primary) hypertension: Secondary | ICD-10-CM

## 2014-01-19 DIAGNOSIS — R338 Other retention of urine: Secondary | ICD-10-CM

## 2014-01-19 LAB — GLUCOSE, CAPILLARY
Glucose-Capillary: 149 mg/dL — ABNORMAL HIGH (ref 70–99)
Glucose-Capillary: 178 mg/dL — ABNORMAL HIGH (ref 70–99)
Glucose-Capillary: 183 mg/dL — ABNORMAL HIGH (ref 70–99)
Glucose-Capillary: 136 mg/dL — ABNORMAL HIGH (ref 70–99)

## 2014-01-19 MED ORDER — TAMSULOSIN HCL 0.4 MG PO CAPS
0.4000 mg | ORAL_CAPSULE | Freq: Every day | ORAL | Status: DC
  Administered 2014-01-19 – 2014-02-02 (×15): 0.4 mg via ORAL
  Filled 2014-01-19 (×19): qty 1

## 2014-01-19 MED ORDER — GLUCERNA SHAKE PO LIQD
237.0000 mL | ORAL | Status: DC
  Administered 2014-01-19 – 2014-01-30 (×11): 237 mL via ORAL

## 2014-01-19 NOTE — Progress Notes (Signed)
INITIAL NUTRITION ASSESSMENT  DOCUMENTATION CODES Per approved criteria  -Not Applicable   INTERVENTION: -Recommend Glucerna once daily at lunch -Encouraged three balanced meals/day or implement supplement regimen as needed -Will continue to monitor  NUTRITION DIAGNOSIS: Unintentional wt loss related to inadequate oral intake as evidenced by 17 lbs unintentional wt loss.   Goal: Pt to meet >/= 90% of their estimated nutrition needs    Monitor:  Total protein/energy intake, labs, weights, supplement acceptance  Reason for Assessment: MST  73 y.o. male  Admitting Dx: <principal problem not specified>  ASSESSMENT: Maurice Stone is a 73 y.o. right handed male with history of hypertension, diabetes mellitus, CAD,COPD as well as CVA 6 years ago with some residual slurred speech maintained on aspirin. Patient lives alone independent prior to admission. Presented 01/15/2014 with frequent falls and right-sided weakness and slurred speech  -Pt endorsed a 17 lbs unintentional wt loss over past one year (7.7% body weight loss, non-severe for time frame) -Diet recall indicates pt consuming eggs/toast/coffee for breakfast, skipping lunch, and canned soups for dinner -Denied nausea/vomiting/abd pain that decreased appetite, pt noted he has minimal interest in cooking lunch meals -Encouraged pt to implement a Glucerna/Boost Carb Control once daily regimen as pt skipping meals/nutrients has likely contributed to unintentional wt loss. Also noted importance of nutrition for rehab/recovery -Was willing to begin supplement during admit and comply once stable to d/c -Denied dysphagia or need for diet texture modifications. Current PO intake excellent at 100%  Height: Ht Readings from Last 1 Encounters:  01/17/14 5\' 10"  (1.778 m)    Weight: Wt Readings from Last 1 Encounters:  01/18/14 204 lb 12.9 oz (92.9 kg)    Ideal Body Weight: 166 lbs  % Ideal Body Weight: 123%  Wt Readings from  Last 10 Encounters:  01/18/14 204 lb 12.9 oz (92.9 kg)  01/17/14 197 lb 8 oz (89.585 kg)    Usual Body Weight: 220 lbs  % Usual Body Weight: 93%  BMI:  Body mass index is 29.39 kg/(m^2). overweight  Estimated Nutritional Needs: Kcal: 1800-2000 Protein: 90-100 gram Fluid: >/=1800 ml/daily  Skin: WDL  Diet Order: Carb Control  EDUCATION NEEDS: -No education needs identified at this time   Intake/Output Summary (Last 24 hours) at 01/19/14 1102 Last data filed at 01/19/14 0830  Gross per 24 hour  Intake    240 ml  Output   1400 ml  Net  -1160 ml    Last BM: 6/26   Labs:   Recent Labs Lab 01/15/14 1445 01/18/14 2017  NA 140  --   K 3.8  --   CL 101  --   CO2 25  --   BUN 17  --   CREATININE 1.00 1.11  CALCIUM 9.6  --   GLUCOSE 218*  --     CBG (last 3)   Recent Labs  01/18/14 1625 01/18/14 2100 01/19/14 0738  GLUCAP 235* 165* 149*    Scheduled Meds: . amLODipine  10 mg Oral Daily  . atorvastatin  20 mg Oral q1800  . buPROPion  100 mg Oral BID  . clopidogrel  75 mg Oral Daily  . donepezil  10 mg Oral QHS  . feeding supplement (GLUCERNA SHAKE)  237 mL Oral Q24H  . fenofibrate  160 mg Oral Daily  . gabapentin  600 mg Oral TID  . heparin  5,000 Units Subcutaneous 3 times per day  . insulin aspart  0-15 Units Subcutaneous TID WC  . insulin aspart  15 Units Subcutaneous TID WC  . insulin glargine  45 Units Subcutaneous BID  . loratadine  10 mg Oral Daily  . losartan  100 mg Oral Daily  . metoprolol  50 mg Oral BID  . pantoprazole  40 mg Oral Daily  . pneumococcal 23 valent vaccine  0.5 mL Intramuscular Tomorrow-1000  . QUEtiapine  200 mg Oral QHS  . sertraline  200 mg Oral Daily  . sulindac  150 mg Oral BID WC    Continuous Infusions:   Past Medical History  Diagnosis Date  . Hypertension   . Diabetes mellitus without complication   . Coronary artery disease   . COPD (chronic obstructive pulmonary disease)   . Stroke     No past  surgical history on file.  Atlee Abide MS RD LDN Clinical Dietitian [phone]

## 2014-01-19 NOTE — Progress Notes (Signed)
Physical Therapy Session Note  Patient Details  Name: Maurice Stone MRN: [id number] Date of Birth: 29-Oct-1940  Today's Date: 01/19/2014 Time: 1062-6948 Time Calculation (min): 30 min    Skilled Therapeutic Interventions/Progress Updates:  Patient in room sitting in w/c , attentive to therapy. Session focused on w/c propulsion 3 x 40 feet with mod A, patient uses B UE and attempts to use B LE.  Gait training 2 x 40 feet,with mod A and manual facilitation of knee extension during stance. Transfer training with min A to and from mat. Patient returned to room with all needs within reach. No c/o pain.   Therapy Documentation Precautions:  Precautions Precautions: Fall Precaution Comments: R sided hemiparesis and R knee buckling Restrictions Weight Bearing Restrictions: No Other Position/Activity Restrictions: fall risk General: Amount of Missed PT Time (min): 20 Minutes Missed Time Reason: Nursing care (patient required I/O cath) Pain: Pain Assessment Pain Assessment: 0-10 Pain Score: 8  Pain Type: Acute pain Pain Location: Coccyx Pain Orientation: Lower Pain Descriptors / Indicators: Sore;Aching Pain Onset: On-going Pain Intervention(s): RN made aware (patient repositioned/shifted self in wheelchair to ease pain periodically) Multiple Pain Sites: No Locomotion : Ambulation Ambulation/Gait Assistance: 1: +2 Total assist;3: Mod assist (+2 for w/c follow; modA for actual gait) Wheelchair Mobility Distance: 10   Balance: Balance Balance Assessed: Yes Static Sitting Balance Static Sitting - Level of Assistance: 6: Modified independent (Device/Increase time) Dynamic Sitting Balance Sitting balance - Comments: supervision during ADL Static Standing Balance Static Standing - Level of Assistance: 4: Min assist (guarding of R knee as it can buckle wihout warning)  See FIM for current functional status  Therapy/Group: Individual Therapy  Guadlupe Spanish 01/19/2014,  1:31 PM

## 2014-01-19 NOTE — Progress Notes (Signed)
Maurice Stone is a 73 y.o. male 1940-08-03 [id number]  Subjective: No new complaints. New problems: urinary retention - residual 700 cc this am. Slept well. Feeling OK.  Objective: Vital signs in last 24 hours: Temp:  [97.7 F (36.5 C)] 97.7 F (36.5 C) (06/27 0525) Pulse Rate:  [53-61] 61 (06/27 0817) Resp:  [18-20] 18 (06/27 0525) BP: (113-121)/(69-71) 113/69 mmHg (06/27 0817) SpO2:  [94 %-96 %] 96 % (06/27 0525) Weight change:  Last BM Date: 01/18/14  Intake/Output from previous day: 06/26 0701 - 06/27 0700 In: 240 [P.O.:240] Out: 700 [Urine:700] Last cbgs: CBG (last 3)   Recent Labs  01/18/14 2100 01/19/14 0738 01/19/14 1158  GLUCAP 165* 149* 178*     Physical Exam General: No apparent distress   HEENT: not dry Lungs: Normal effort. Lungs clear to auscultation, no crackles or wheezes. Cardiovascular: Regular rate and rhythm, no edema Abdomen: S/NT/ND; BS(+) Musculoskeletal:  unchanged Neurological: No new neurological deficits Wounds: N/A    Skin: clear  Aging changes Mental state: Alert, oriented, cooperative    Lab Results: BMET    Component Value Date/Time   NA 140 01/15/2014 1445   K 3.8 01/15/2014 1445   CL 101 01/15/2014 1445   CO2 25 01/15/2014 1445   GLUCOSE 218* 01/15/2014 1445   BUN 17 01/15/2014 1445   CREATININE 1.11 01/18/2014 2017   CALCIUM 9.6 01/15/2014 1445   GFRNONAA 65* 01/18/2014 2017   GFRAA 75* 01/18/2014 2017   CBC    Component Value Date/Time   WBC 7.4 01/18/2014 2017   RBC 4.68 01/18/2014 2017   HGB 14.3 01/18/2014 2017   HCT 42.4 01/18/2014 2017   PLT 123* 01/18/2014 2017   MCV 90.6 01/18/2014 2017   MCH 30.6 01/18/2014 2017   MCHC 33.7 01/18/2014 2017   RDW 14.0 01/18/2014 2017   LYMPHSABS 3.3 01/15/2014 1445   MONOABS 0.9 01/15/2014 1445   EOSABS 0.2 01/15/2014 1445   BASOSABS 0.0 01/15/2014 1445    Studies/Results: No results found.  Medications: I have reviewed the patient's current medications.  Assessment/Plan:  1.  Functional deficits secondary to acute left paramedian pontine infarct  2. DVT Prophylaxis/Anticoagulation: Subcutaneous heparin. Monitor platelet counts and any signs of bleeding.  3. Pain Management: Clinoril 150 mg twice a day, Neurontin 600 mg 3 times a day, Hydrocodone as needed. Monitor with increased mobility  4. Mood/memory loss/depression: Wellbutrin 100 mg twice a day. Aricept 10 mg each bedtime, Seroquel 200 mg each bedtime, Zoloft 200 mg daily.  5. Neuropsych: This patient is capable of making decisions on his own behalf.  6. Hypertension. Norvasc 10 mg daily, Cozaar 100 mg daily, Lopressor 50 mg twice a day. Monitor with increased mobility  7. Diabetes mellitus with peripheral neuropathy. Hemoglobin A1c 10.6. Lantus insulin 40 units twice a day, NovoLog 12 units 3 times a day. Check blood sugars a.c. and at bedtime. Provide diabetic teaching  8. Hyperlipidemia. Lipitor 9. New urinary retention: Foley if needed. Will review meds. May need Rapaflo        Length of stay, days: 1  Walker Kehr , MD 01/19/2014, 3:41 PM

## 2014-01-19 NOTE — Evaluation (Signed)
Physical Therapy Assessment and Plan  Patient Details  Name: Maurice Stone MRN: [id number] Date of Birth: 12-Feb-1941  PT Diagnosis: Abnormality of gait, Cognitive deficits, Coordination disorder, Hemiparesis dominant, Impaired cognition, Low back pain, Muscle weakness and Pain in sacrum Rehab Potential: Good ELOS: 10-12 days   Today's Date: 01/19/2014 Time: 9449-6759 and [id number] Time Calculation (min): 14 min and 26 min  Problem List:  Patient Active Problem List   Diagnosis Date Noted  . CVA (cerebral infarction) 01/18/2014  . Acute CVA (cerebrovascular accident) 01/17/2014  . CVA (cerebral vascular accident) 01/15/2014  . Essential hypertension, benign 01/15/2014  . Bradycardia 01/15/2014  . chronic Right sided weakness 01/15/2014  . GERD (gastroesophageal reflux disease) 01/15/2014  . Neuropathy 01/15/2014    Past Medical History:  Past Medical History  Diagnosis Date  . Hypertension   . Diabetes mellitus without complication   . Coronary artery disease   . COPD (chronic obstructive pulmonary disease)   . Stroke    Past Surgical History: No past surgical history on file.  Assessment & Plan Clinical Impression: Maurice Stone is a 73 y.o. right handed male with history of hypertension, diabetes mellitus, CAD,COPD as well as CVA 6 years ago with some residual slurred speech maintained on aspirin. Patient lives alone independent prior to admission. Presented 01/15/2014 with frequent falls and right-sided weakness and slurred speech. MRI shows acute nonhemorrhagic left paramedian pontine infarct as well as remote infarct left basal ganglia. MRA of the head without stenosis or occlusion. Echocardiogram with ejection fraction 99% grade 1 diastolic dysfunction. Carotid Dopplers no ICA stenosis. Patient did not receive TPA. Neurology services consulted aspirin increased to 325 mg daily as well as the addition of subcutaneous heparin for DVT prophylaxis. Hemoglobin A1c of 10.6  with insulin therapy as directed. Patient is tolerating a regular consistency diet. Physical therapy evaluation completed 01/16/2014 with recommendations of physical medicine rehabilitation consult. Patient was admitted for comprehensive rehabilitation program. Patient transferred to CIR on 01/18/2014 .   Patient currently requires min- mod with mobility secondary to muscle weakness, decreased cardiorespiratoy endurance, impaired timing and sequencing, unbalanced muscle activation and decreased coordination, na, decreased midline orientation, decreased attention, decreased awareness, decreased problem solving, decreased safety awareness and decreased memory and decreased standing balance, decreased postural control, hemiplegia and decreased balance strategies.  Prior to hospitalization, patient was modified independent  with mobility and lived with Alone in a [place] home.  Home access is  Level entry.  Patient will benefit from skilled PT intervention to maximize safe functional mobility, minimize fall risk and decrease caregiver burden for planned discharge home with intermittent assist.  Anticipate patient will benefit from follow up [facility] at discharge.  PT - End of Session Activity Tolerance: Tolerates 30+ min activity with multiple rests Endurance Deficit: Yes Endurance Deficit Description: fatigues quickly, requires frequent rest breaks PT Assessment Rehab Potential: Good Barriers to Discharge: Decreased caregiver support Barriers to Discharge Comments: Patient lives alone PT Patient demonstrates impairments in the following area(s): Balance;Endurance;Motor;Pain;Perception;Safety PT Transfers Functional Problem(s): Bed Mobility;Bed to Chair;Car;Furniture;Floor PT Locomotion Functional Problem(s): Ambulation;Wheelchair Mobility;Stairs PT Plan PT Intensity: Minimum of 1-2 x/day ,45 to 90 minutes PT Frequency: 5 out of 7 days PT Duration Estimated Length of Stay: 10-12 days PT  Treatment/Interventions: Ambulation/gait training;Disease management/prevention;Pain management;Stair training;Visual/perceptual remediation/compensation;Wheelchair propulsion/positioning;Therapeutic Activities;Patient/family education;DME/adaptive equipment instruction;Balance/vestibular training;Cognitive remediation/compensation;Psychosocial support;Therapeutic Exercise;UE/LE Strength taining/ROM;Functional mobility training;Community reintegration;Discharge planning;Neuromuscular re-education;Splinting/orthotics;UE/LE Coordination activities PT Transfers Anticipated Outcome(s): supervision PT Locomotion Anticipated Outcome(s): supervision PT Recommendation Recommendations for Other Services: Speech consult  Follow Up Recommendations: 24 hour supervision/assistance;Home health PT Patient destination: Home Equipment Recommended: To be determined Equipment Details: Patient owns RW and [facility] [employer], further recommendations TBD upon discharge  Skilled Therapeutic Intervention Skilled therapeutic intervention initiated after completion of evaluation. Discussed falls risk, safety within room, and focus of therapy during stay. Discussed possible LOS, goals, and f/u therapy.  PT Evaluation Precautions/Restrictions Precautions Precautions: Fall Precaution Comments: R sided hemiparesis and R knee buckling Restrictions Weight Bearing Restrictions: No General Amount of Missed PT Time (min): 20 Minutes Missed Time Reason: Nursing care (patient required I/O cath)  Pain Pain Assessment Pain Assessment: 0-10 Pain Score: 7  Pain Type: Acute pain Pain Location: Sacrum Pain Descriptors / Indicators: Aching;Sore Pain Onset: On-going Pain Intervention(s): Repositioned;RN made aware;Ambulation/increased activity Multiple Pain Sites: No Home Living/Prior Functioning Home Living Available Help at Discharge: Family;Available PRN/intermittently (4 children, all local) Type of Home: Apartment Home Access: Level  entry Home Layout: One level  Lives With: Alone Prior Function Level of Independence: Requires assistive device for independence (RW in house, [facility] in community)  Able to Take Stairs?: No (reports he "has not tried recently") Driving: Yes Vocation: Retired Vision/Perception    Wears glasses all the time. No change in vision from baseline. Cognition Overall Cognitive Status: Impaired/Different from baseline Arousal/Alertness: Awake/alert Orientation Level: Oriented X4 Attention: Selective Selective Attention: Impaired Selective Attention Impairment: Verbal basic;Functional basic Memory: Appears intact Awareness: Impaired Awareness Impairment: Anticipatory impairment Safety/Judgment: Impaired Sensation Sensation Light Touch: Appears Intact Proprioception: Appears Intact Coordination Gross Motor Movements are Fluid and Coordinated: No Fine Motor Movements are Fluid and Coordinated: No Coordination and Movement Description: decreased speed and accuracy with R sided movements due to residual effects from previous CVA Motor  Motor Motor: Hemiplegia;Abnormal postural alignment and control Motor - Skilled Clinical Observations: R sided hemiparesis  Mobility Bed Mobility Bed Mobility: Supine to Sit Supine to Sit: 5: Supervision;HOB flat Supine to Sit Details: Verbal cues for sequencing;Verbal cues for precautions/safety Transfers Transfers: Yes Sit to Stand: 4: Min assist;From bed;With upper extremity assist Sit to Stand Details: Tactile cues for placement;Verbal cues for sequencing;Tactile cues for sequencing;Verbal cues for precautions/safety;Verbal cues for technique Stand to Sit: 3: Mod assist;With armrests;With upper extremity assist;To chair/3-in-1 Stand to Sit Details (indicate cue type and reason): Tactile cues for placement;Verbal cues for sequencing;Tactile cues for sequencing;Verbal cues for precautions/safety;Verbal cues for technique Stand Pivot Transfers: 4: Min  assist;3: Mod assist (minA for sit>stand, modA for stand>sit secondary to poor control) Stand Pivot Transfer Details: Verbal cues for precautions/safety;Tactile cues for placement;Tactile cues for sequencing;Verbal cues for sequencing;Verbal cues for technique Locomotion  Ambulation Ambulation: Yes Ambulation/Gait Assistance: 1: +2 Total assist;3: Mod assist (+2 for w/c follow; modA for actual gait) Ambulation Distance (Feet): 25 Feet Assistive device: Other (Comment) (L handrail) Ambulation/Gait Assistance Details: Tactile cues for posture;Verbal cues for sequencing;Verbal cues for precautions/safety;Tactile cues for sequencing;Manual facilitation for weight shifting;Manual facilitation for placement;Manual facilitation for weight bearing Ambulation/Gait Assistance Details: Patient performed gait training 25' x1 with L handrail and modA, +2 for w/c follow. Patient requires manual facilitation for upright posture, stabilization of R knee secondary to buckling with weight bearing, and intermittent tactile cues/facilitation for increasing R step length and BOS. Gait Gait: Yes Gait Pattern: Impaired Gait Pattern: Step-through pattern;Narrow base of support;Trunk flexed;Decreased stride length;Decreased stance time - right;Decreased step length - left;Decreased weight shift to right;Lateral trunk lean to left Stairs / Additional Locomotion Stairs: Yes Stairs Assistance: 3: Mod assist Stairs Assistance Details: Visual cues/gestures for sequencing;Verbal cues  for sequencing;Verbal cues for technique;Verbal cues for precautions/safety;Tactile cues for sequencing;Manual facilitation for weight shifting;Manual facilitation for placement;Manual facilitation for weight bearing Stairs Assistance Details (indicate cue type and reason): Ascends forwards, descends backwards Stair Management Technique: Two rails;Step to pattern;Forwards;Backwards Number of Stairs: 2 Height of Stairs: 6 Engineer, manufacturing: Yes Wheelchair Assistance: 4: Advertising account executive Details: Tactile cues for placement;Verbal cues for precautions/safety;Verbal cues for technique;Verbal cues for sequencing;Tactile cues for sequencing Wheelchair Propulsion: Both upper extremities Wheelchair Parts Management: Needs assistance Distance: 10  Trunk/Postural Assessment  Cervical Assessment Cervical Assessment: Exceptions to [facility] (forward head posture) Thoracic Assessment Thoracic Assessment: Within Functional Limits Lumbar Assessment Lumbar Assessment: Within Functional Limits Postural Control Postural Control: Deficits on evaluation Righting Reactions: delayed Protective Responses: delayed Postural Limitations: L lateral trunk lean in sitting and standing  Balance Balance Balance Assessed: Yes Static Sitting Balance Static Sitting - Balance Support: Bilateral upper extremity supported;No upper extremity supported;Feet supported Static Sitting - Level of Assistance: 5: Stand by assistance Static Standing Balance Static Standing - Balance Support: Left upper extremity supported;Bilateral upper extremity supported;During functional activity Static Standing - Level of Assistance: 4: Min assist Extremity Assessment  RLE Assessment RLE Assessment: Exceptions to [facility] RLE Strength RLE Overall Strength: Deficits;Due to premorbid status RLE Overall Strength Comments: Hip flexion 2+/5; grossly 3+/5 rest of LE LLE Assessment LLE Assessment: Within Functional Limits  FIM:  FIM - Control and instrumentation engineer Devices: Arm rests Bed/Chair Transfer: 5: Supine > Sit: Supervision (verbal cues/safety issues);3: Bed > Chair or W/C: Mod A (lift or lower assist) FIM - Locomotion: Wheelchair Distance: 10 Locomotion: Wheelchair: 1: Travels less than 50 ft with minimal assistance (Pt.>75%) FIM - Locomotion: Ambulation Ambulation/Gait Assistance: 1: +2 Total assist;3: Mod  assist (+2 for w/c follow; modA for actual gait) Locomotion: Ambulation: 1: Two helpers FIM - Locomotion: Stairs Locomotion: Scientist, physiological: Insurance account manager - 2 Locomotion: Stairs: 1: Up and Down < 4 stairs with moderate assistance (Pt: 50 - 74%)   Refer to Care Plan for Long Term Goals  Recommendations for other services: None  Discharge Criteria: Patient will be discharged from PT if patient refuses treatment 3 consecutive times without medical reason, if treatment goals not met, if there is a change in medical status, if patient makes no progress towards goals or if patient is discharged from hospital.  The above assessment, treatment plan, treatment alternatives and goals were discussed and mutually agreed upon: by patient  Lillia Abed. Ripa, PT, DPT 01/19/2014, 12:18 PM

## 2014-01-19 NOTE — Evaluation (Signed)
Speech Language Pathology Assessment and Plan  Patient Details  Name: Maurice Stone MRN: [id number] Date of Birth: Apr 03, 1941  SLP Diagnosis: Dysarthria;Cognitive Impairments  Rehab Potential: Good ELOS: 10-14 days   Today's Date: 01/19/2014 Time: 1100-1200 Time Calculation (min): 60 min  Problem List:  Patient Active Problem List   Diagnosis Date Noted  . CVA (cerebral infarction) 01/18/2014  . Acute CVA (cerebrovascular accident) 01/17/2014  . CVA (cerebral vascular accident) 01/15/2014  . Essential hypertension, benign 01/15/2014  . Bradycardia 01/15/2014  . chronic Right sided weakness 01/15/2014  . GERD (gastroesophageal reflux disease) 01/15/2014  . Neuropathy 01/15/2014   Past Medical History:  Past Medical History  Diagnosis Date  . Hypertension   . Diabetes mellitus without complication   . Coronary artery disease   . COPD (chronic obstructive pulmonary disease)   . Stroke    Past Surgical History: No past surgical history on file.  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 Patient also presents with impairment in respiratory support, control, and coordination of respiration with phonation, resulting in decreased vocal intensity and declining vocal quality during sentence and conversational level communication. Patient presents with cognitive impairments in areas of memory, reasoning, problem solving and awareness to impact of deficits. No family was present to determine if he had  any premorbid cognitive impairments. He benefits from clear, concise instructions, demonstration, and model in order to perform tasks. Patient's deficts impact his safety and ability to effectively communicate his wants and needs with others.  Skilled Therapeutic Interventions            SLP  Assessment  Patient will need skilled [place] [facility] Services during CIR admission    Recommendations  Patient destination: Home Follow up Recommendations: Home Health SLP (pending progress, may benefit from [facility] SLP following CIR discharge)    SLP Frequency 5 out of 7 days   SLP Treatment/Interventions Cognitive remediation/compensation;Speech/Language facilitation;Functional tasks;Cueing hierarchy;Patient/family education;Internal/external aids    Pain Pain Assessment Pain Assessment: 0-10 Pain Score: 8  Pain Type: Acute pain Pain Location: Coccyx Pain Orientation: Lower Pain Descriptors / Indicators: Sore;Aching Pain Onset: On-going Pain Intervention(s): RN made aware (patient repositioned/shifted self in wheelchair to ease pain periodically) Multiple Pain Sites: No Prior Functioning Cognitive/Linguistic Baseline: Information not available Type of Home: Apartment  Lives With: Alone Available Help at Discharge: Family;Available PRN/intermittently Vocation: Retired  Industrial/product designer Term Goals: Week 1: SLP Short Term Goal 1 (Week 1): Patient will perform respiratory support/control/coordination exercises with 85% accuracy with moderate cues. SLP Short Term Goal 2 (Week 1): Patient will utilize learned speech strategies to increase speech intelligiblity and quality to 90% at sentence level. SLP Short Term Goal 3 (Week 1): Patient will recall and demonstrate understanding of learned strategies/safety precautions/exercises by verbalizing and return-demonstrating with 80% accuracy.  See FIM for current functional status Refer to Care Plan for Long Term Goals  Recommendations for other services: None  Discharge Criteria: Patient will be discharged from SLP if patient refuses treatment 3 consecutive times without medical reason, if treatment goals not met, if there is a change in medical status, if patient makes no progress towards goals or if patient is discharged from  hospital.  The above assessment, treatment plan, treatment alternatives and goals were discussed and mutually agreed upon: by patient  Maurice Stone 01/19/2014, 12:57 PM  Maurice Baller, MA, CCC-SLP  [facility] Speech-Language Pathologist

## 2014-01-19 NOTE — Progress Notes (Signed)
Occupational Therapy Note  Patient Details  Name: Maurice Stone MRN: [id number] Date of Birth: 1941/02/07 Today's Date: 01/19/2014  Time In:  10:00 Time Out:  10:50.  Individual session, patient with pain in tailbone and states he fractured it when he fell at home with the stroke.  RN notified and provided pain med.  Initially 8/10 at end of session 4/10.  Eval completed please see for details.  Treatment initiated with focus on ADL retraining and emphasis on safety, sit to stand, standing balance, control of Right knee when weightbearing and weightshifting, increasing activity tolerance, problem solving, short term memory.  Patient is very motivated.    Quay Burow 01/19/2014, 12:49 PM

## 2014-01-19 NOTE — Evaluation (Addendum)
Occupational Therapy Assessment and Plan  Patient Details  Name: Maurice Stone MRN: [id number] Date of Birth: 01-05-1941  OT Diagnosis: abnormal posture, acute pain, cognitive deficits and hemiplegia affecting dominant side Rehab Potential: Rehab Potential: Good ELOS:     Today's Date: 01/19/2014 Time: 1000-1050 Time Calculation (min): 50 min  Problem List:  Patient Active Problem List   Diagnosis Date Noted  . CVA (cerebral infarction) 01/18/2014  . Acute CVA (cerebrovascular accident) 01/17/2014  . CVA (cerebral vascular accident) 01/15/2014  . Essential hypertension, benign 01/15/2014  . Bradycardia 01/15/2014  . chronic Right sided weakness 01/15/2014  . GERD (gastroesophageal reflux disease) 01/15/2014  . Neuropathy 01/15/2014    Past Medical History:  Past Medical History  Diagnosis Date  . Hypertension   . Diabetes mellitus without complication   . Coronary artery disease   . COPD (chronic obstructive pulmonary disease)   . Stroke    Past Surgical History: No past surgical history on file.  Assessment & Plan Clinical Impression: Patient is a 73 y.o. year old male with recent admission to the hospital on 01/15/2014 with right sided weakness and slurred speech.  MRI = L paremedian pontine infarct and remote BG infarct.  PMH:  HTN, DM, CAD, COPD, previous CVA six years ago.  Patient transferred to CIR on 01/18/2014 . Patient lives in an apartment alone but had numerous falls daily PTA.    Patient currently requires min with basic self-care skills secondary to impaired timing and sequencing, abnormal tone, unbalanced muscle activation and hemiplegia. Patient also with cognitive deficits including short term memory deficits, judgement, safety, decision making, problem solving..  Prior to hospitalization, patient could complete basic ADL's with independent  per chart however patient was falling daily at home and therefore question whether patient was truly  independent..  Patient will benefit from skilled intervention to decrease level of assist with basic self-care skills prior to discharge home with care partner.  Anticipate patient will require 24 hour supervision and follow up home health.  OT - End of Session Activity Tolerance: Improving Endurance Deficit: Yes Endurance Deficit Description: fatigues quickly, requires frequent rest breaks OT Assessment Rehab Potential: Good OT Patient demonstrates impairments in the following area(s): Balance;Cognition;Endurance;Motor;Pain;Safety OT Basic ADL's Functional Problem(s): Bathing;Dressing;Toileting OT Transfers Functional Problem(s): Toilet;Tub/Shower OT Additional Impairment(s): Fuctional Use of Upper Extremity OT Plan OT Intensity: Minimum of 1-2 x/day, 45 to 90 minutes OT Frequency: 5 out of 7 days Recommended LOS 10-12 days OT Treatment/Interventions: Balance/vestibular training;Cognitive remediation/compensation;Discharge planning;Community reintegration;Disease mangement/prevention;DME/adaptive equipment instruction;Functional mobility training;Neuromuscular re-education;Pain management;Patient/family education;Psychosocial support;Self Care/advanced ADL retraining;Therapeutic Activities;Therapeutic Exercise;UE/LE Strength taining/ROM;UE/LE Coordination activities OT Basic Self-Care Anticipated Outcome(s): s OT Toileting Anticipated Outcome(s): s OT Bathroom Transfers Anticipated Outcome(s): s OT Recommendation Patient destination: Home Follow Up Recommendations: Outpatient OT Equipment Recommended: To be determined   Skilled Therapeutic Intervention   OT Evaluation Precautions/Restrictions  Precautions Precautions: Fall Precaution Comments: R sided hemiparesis and R knee buckling Restrictions Weight Bearing Restrictions: No Other Position/Activity Restrictions: fall risk General Missed Time Reason: Nursing care (patient required I/O cath) Vital Signs Therapy  Vitals Pulse Rate: 61 BP: 113/69 mmHg Pain Pain Assessment Pain Assessment: 0-10 Pain Score: 8  (tailbone - pt states he fractured it when he fell with stroke) Pain Type: Acute pain Pain Location: Coccyx Pain Descriptors / Indicators: Sore Pain Onset: Gradual Pain Intervention(s): RN made aware;Repositioned (RN provided pain med; at end of session 4/10) Multiple Pain Sites: No Home Living/Prior Functioning Home Living Available Help at Discharge: Family;Available PRN/intermittently (4  children, all local) Type of Home: Apartment Home Access: Level entry Home Layout: One level  Lives With: Alone Prior Function Level of Independence: Requires assistive device for independence (patient reports using RW in house and cane in community)  Able to Take Stairs?: No (Patient reports he "has not tried recently") Driving: Yes Vocation: Retired ADL   Vision/Perception  Vision- History Baseline Vision/History: No visual deficits Wears Glasses: At all times Patient Visual Report: No change from baseline Vision- Assessment Vision Assessment?: No apparent visual deficits  Cognition Overall Cognitive Status: Impaired/Different from baseline (no family here so difficult to assess if new deficits) Arousal/Alertness: Awake/alert Orientation Level: Oriented X4 Attention: Selective Selective Attention: Impaired Selective Attention Impairment: Verbal basic;Functional basic Memory: Impaired Memory Impairment: Decreased recall of new information;Decreased short term memory Decreased Short Term Memory: Verbal basic;Verbal complex;Functional complex;Functional basic Awareness: Impaired Awareness Impairment: Emergent impairment Problem Solving: Impaired Problem Solving Impairment: Verbal complex;Functional complex Executive Function: Reasoning;Decision Making;Self Monitoring Reasoning: Impaired Reasoning Impairment: Verbal complex;Functional basic;Functional complex;Verbal basic Decision  Making: Impaired Decision Making Impairment: Verbal basic;Verbal complex;Functional basic;Functional complex Self Monitoring: Impaired Safety/Judgment: Impaired Sensation Sensation Light Touch: Appears Intact Proprioception: Appears Intact Coordination Gross Motor Movements are Fluid and Coordinated: No Fine Motor Movements are Fluid and Coordinated: No Coordination and Movement Description: decreased speed and accuracy with R sided movements due to residual effects from previous CVA Motor  Motor Motor: Hemiplegia Motor - Skilled Clinical Observations: R sided hemiparesis Mobility  Bed Mobility Bed Mobility: Supine to Sit Supine to Sit: 5: Supervision;HOB flat Supine to Sit Details: Verbal cues for sequencing;Verbal cues for precautions/safety Transfers Sit to Stand: 4: Min assist;From bed;With upper extremity assist Sit to Stand Details: Tactile cues for placement;Verbal cues for sequencing;Tactile cues for sequencing;Verbal cues for precautions/safety;Verbal cues for technique Stand to Sit: 3: Mod assist;With armrests;With upper extremity assist;To chair/3-in-1 Stand to Sit Details (indicate cue type and reason): Tactile cues for placement;Verbal cues for sequencing;Tactile cues for sequencing;Verbal cues for precautions/safety;Verbal cues for technique  Trunk/Postural Assessment  Cervical Assessment Cervical Assessment: Exceptions to [facility] (forward head posture) Thoracic Assessment Thoracic Assessment: Within Functional Limits Lumbar Assessment Lumbar Assessment: Within Functional Limits Postural Control Postural Control: Deficits on evaluation Righting Reactions: delayed Protective Responses: delayed Postural Limitations: L lateral trunk lean in sitting and standing  Balance Balance Balance Assessed: Yes Static Sitting Balance Static Sitting - Balance Support: Bilateral upper extremity supported;No upper extremity supported;Feet supported Static Sitting - Level of  Assistance: 6: Modified independent (Device/Increase time) Dynamic Sitting Balance Sitting balance - Comments: supervision during ADL Static Standing Balance Static Standing - Balance Support: Left upper extremity supported;Bilateral upper extremity supported;During functional activity Static Standing - Level of Assistance: 4: Min assist (guarding of R knee as it can buckle wihout warning) Extremity/Trunk Assessment RUE Assessment RUE Assessment: Exceptions to [facility] (pt has ilsolated mvmt in all joints;decreased strength ccordination) LUE Assessment LUE Assessment: Within Functional Limits  FIM:  FIM - Eating Eating Activity: 0: Activity did not occur FIM - Grooming Grooming Steps: Wash, rinse, dry face;Wash, rinse, dry hands;Oral care, brush teeth, clean dentures Grooming: 6: More than reasonable amount of time FIM - Bathing Bathing Steps Patient Completed: Chest;Right Arm;Left Arm;Abdomen;Front perineal area;Right upper leg;Left upper leg;Right lower leg (including foot);Left lower leg (including foot) Bathing: 4: Min-Patient completes 8-9 35f10 parts or 75+ percent (sit to stand at sink) FIM - Upper Body Dressing/Undressing Upper body dressing/undressing: 0: Wears gown/pajamas-no public clothing FIM - Lower Body Dressing/Undressing Lower body dressing/undressing: 0: Wears gown/pajamas-no public  clothing (pt states family can bring in street clothes) FIM - Toileting Toileting: 0: Activity did not occur FIM - Engineer, site Assistive Devices: Arm rests Bed/Chair Transfer: 5: Supine > Sit: Supervision (verbal cues/safety issues);3: Bed > Chair or W/C: Mod A (lift or lower assist) FIM - Air cabin crew Transfers: 0-Activity did not occur FIM - Tub/Shower Transfers Tub/shower Transfers: 0-Activity did not occur or was simulated   Refer to Care Plan for Long Term Goals  Recommendations for other services: None  Discharge Criteria: Patient will be  discharged from OT if patient refuses treatment 3 consecutive times without medical reason, if treatment goals not met, if there is a change in medical status, if patient makes no progress towards goals or if patient is discharged from hospital.  The above assessment, treatment plan, treatment alternatives and goals were discussed and mutually agreed upon: by patient  Quay Burow 01/19/2014, 11:03 AM

## 2014-01-20 DIAGNOSIS — M6281 Muscle weakness (generalized): Secondary | ICD-10-CM

## 2014-01-20 LAB — GLUCOSE, CAPILLARY
GLUCOSE-CAPILLARY: 148 mg/dL — AB (ref 70–99)
GLUCOSE-CAPILLARY: 211 mg/dL — AB (ref 70–99)
GLUCOSE-CAPILLARY: 189 mg/dL — AB (ref 70–99)
Glucose-Capillary: 139 mg/dL — ABNORMAL HIGH (ref 70–99)

## 2014-01-20 NOTE — ED Provider Notes (Signed)
Medical screening examination/treatment/procedure(s) were performed by non-physician practitioner and as supervising physician I was immediately available for consultation/collaboration.   EKG Interpretation   Date/Time:  Tuesday January 15 2014 14:58:48 EDT Ventricular Rate:  55 PR Interval:  166 QRS Duration: 114 QT Interval:  452 QTC Calculation: 432 R Axis:   -48 Text Interpretation:  Sinus bradycardia Left anterior fascicular block  Nonspecific T wave abnormality Abnormal ECG Since last tracing rate slower  Confirmed by BELFI  MD, MELANIE ([id number]) on 01/15/2014 3:11:47 PM        Malvin Johns, MD 01/20/14 [id number]

## 2014-01-20 NOTE — Progress Notes (Signed)
Maurice Stone is a 73 y.o. male 1940/09/26 [id number]  Subjective: No new complaints. He had urinary retention - residual 700 cc yesterday am. Slept well. Feeling OK.  Objective: Vital signs in last 24 hours: Temp:  [97.3 F (36.3 C)-97.7 F (36.5 C)] 97.4 F (36.3 C) (06/28 0500) Pulse Rate:  [54-61] 61 (06/28 0831) Resp:  [18-19] 19 (06/28 0500) BP: (101-130)/(58-79) 110/65 mmHg (06/28 0831) SpO2:  [94 %-97 %] 97 % (06/28 0500) Weight change:  Last BM Date: 01/19/14  Intake/Output from previous day: 06/27 0701 - 06/28 0700 In: -  Out: 700 [Urine:700] Last cbgs: CBG (last 3)   Recent Labs  01/19/14 1639 01/19/14 2046 01/20/14 0736  GLUCAP 183* 136* 139*     Physical Exam General: No apparent distress   HEENT: not dry Lungs: Normal effort. Lungs clear to auscultation, no crackles or wheezes. Cardiovascular: Regular rate and rhythm, no edema Abdomen: S/NT/ND; BS(+) Musculoskeletal:  unchanged Neurological: No new neurological deficits Wounds: N/A    Skin: clear  Aging changes Mental state: Alert, oriented, cooperative    Lab Results: BMET    Component Value Date/Time   NA 140 01/15/2014 1445   K 3.8 01/15/2014 1445   CL 101 01/15/2014 1445   CO2 25 01/15/2014 1445   GLUCOSE 218* 01/15/2014 1445   BUN 17 01/15/2014 1445   CREATININE 1.11 01/18/2014 2017   CALCIUM 9.6 01/15/2014 1445   GFRNONAA 65* 01/18/2014 2017   GFRAA 75* 01/18/2014 2017   CBC    Component Value Date/Time   WBC 7.4 01/18/2014 2017   RBC 4.68 01/18/2014 2017   HGB 14.3 01/18/2014 2017   HCT 42.4 01/18/2014 2017   PLT 123* 01/18/2014 2017   MCV 90.6 01/18/2014 2017   MCH 30.6 01/18/2014 2017   MCHC 33.7 01/18/2014 2017   RDW 14.0 01/18/2014 2017   LYMPHSABS 3.3 01/15/2014 1445   MONOABS 0.9 01/15/2014 1445   EOSABS 0.2 01/15/2014 1445   BASOSABS 0.0 01/15/2014 1445    Studies/Results: No results found.  Medications: I have reviewed the patient's current  medications.  Assessment/Plan:  1. Functional deficits secondary to acute left paramedian pontine infarct  2. DVT Prophylaxis/Anticoagulation: Subcutaneous heparin. Monitor platelet counts and any signs of bleeding.  3. Pain Management: Clinoril 150 mg twice a day, Neurontin 600 mg 3 times a day, Hydrocodone as needed. Monitor with increased mobility  4. Mood/memory loss/depression: Wellbutrin 100 mg twice a day. Aricept 10 mg each bedtime, Seroquel 200 mg each bedtime, Zoloft 200 mg daily.  5. Neuropsych: This patient is capable of making decisions on his own behalf.  6. Hypertension. Norvasc 10 mg daily, Cozaar 100 mg daily, Lopressor 50 mg twice a day. Monitor with increased mobility  7. Diabetes mellitus with peripheral neuropathy. Hemoglobin A1c 10.6. Lantus insulin 40 units twice a day, NovoLog 12 units 3 times a day. Check blood sugars a.c. and at bedtime. Provide diabetic teaching  8. Hyperlipidemia. Lipitor 9. Urinary retention: Foley if needed. Will review meds. We started him on Flomax  Length of stay, days: 2  Walker Kehr , MD 01/20/2014, 8:36 AM

## 2014-01-21 ENCOUNTER — Inpatient Hospital Stay (HOSPITAL_COMMUNITY): Payer: Medicare Other

## 2014-01-21 ENCOUNTER — Inpatient Hospital Stay (HOSPITAL_COMMUNITY): Payer: Medicare Other | Admitting: Occupational Therapy

## 2014-01-21 ENCOUNTER — Inpatient Hospital Stay (HOSPITAL_COMMUNITY): Payer: Medicare Other | Admitting: Speech Pathology

## 2014-01-21 DIAGNOSIS — I6340 Cerebral infarction due to embolism of unspecified cerebral artery: Secondary | ICD-10-CM

## 2014-01-21 LAB — COMPREHENSIVE METABOLIC PANEL
ALBUMIN: 3.3 g/dL — AB (ref 3.5–5.2)
ALK PHOS: 83 U/L (ref 39–117)
ALT: 26 U/L (ref 0–53)
AST: 25 U/L (ref 0–37)
BUN: 15 mg/dL (ref 6–23)
CHLORIDE: 102 meq/L (ref 96–112)
CO2: 24 mEq/L (ref 19–32)
Calcium: 9.3 mg/dL (ref 8.4–10.5)
Creatinine, Ser: 1.01 mg/dL (ref 0.50–1.35)
GFR calc Af Amer: 84 mL/min — ABNORMAL LOW (ref >90)
GFR calc non Af Amer: 73 mL/min — ABNORMAL LOW (ref >90)
Glucose, Bld: 129 mg/dL — ABNORMAL HIGH (ref 70–99)
POTASSIUM: 4.6 meq/L (ref 3.7–5.3)
Sodium: 139 mEq/L (ref 137–147)
Total Bilirubin: 0.4 mg/dL (ref 0.3–1.2)
Total Protein: 6.8 g/dL (ref 6.0–8.3)

## 2014-01-21 LAB — CBC WITH DIFFERENTIAL/PLATELET
BASOS PCT: 0 % (ref 0–1)
Basophils Absolute: 0 10*3/uL (ref 0.0–0.1)
Eosinophils Absolute: 0.2 10*3/uL (ref 0.0–0.7)
Eosinophils Relative: 2 % (ref 0–5)
HCT: 41.9 % (ref 39.0–52.0)
HEMOGLOBIN: 13.8 g/dL (ref 13.0–17.0)
Lymphocytes Relative: 37 % (ref 12–46)
Lymphs Abs: 3 10*3/uL (ref 0.7–4.0)
MCH: 30.3 pg (ref 26.0–34.0)
MCHC: 32.9 g/dL (ref 30.0–36.0)
MCV: 92.1 fL (ref 78.0–100.0)
MONOS PCT: 11 % (ref 3–12)
Monocytes Absolute: 0.9 10*3/uL (ref 0.1–1.0)
NEUTROS ABS: 4.1 10*3/uL (ref 1.7–7.7)
Neutrophils Relative %: 50 % (ref 43–77)
Platelets: 125 10*3/uL — ABNORMAL LOW (ref 150–400)
RBC: 4.55 MIL/uL (ref 4.22–5.81)
RDW: 14.5 % (ref 11.5–15.5)
WBC: 8.3 10*3/uL (ref 4.0–10.5)

## 2014-01-21 LAB — GLUCOSE, CAPILLARY
GLUCOSE-CAPILLARY: 117 mg/dL — AB (ref 70–99)
GLUCOSE-CAPILLARY: 164 mg/dL — AB (ref 70–99)
Glucose-Capillary: 122 mg/dL — ABNORMAL HIGH (ref 70–99)
Glucose-Capillary: 188 mg/dL — ABNORMAL HIGH (ref 70–99)

## 2014-01-21 NOTE — Progress Notes (Signed)
Physical Therapy Session Note  Patient Details  Name: Maurice Stone MRN: [id number] Date of Birth: Jan 21, 1941  Today's Date: 01/21/2014 Time: 1005-1105 Time Calculation (min): 60 min  Short Term Goals: Week 1:  PT Short Term Goal 1 (Week 1): Patient will perform functional transfers with supervision. PT Short Term Goal 2 (Week 1): Patient will perform gait training 150' with LRAD and minA. PT Short Term Goal 3 (Week 1): Patient will negotiate 5 steps with one handrail and minA for community access. PT Short Term Goal 4 (Week 1): Patient will demonstrate carry over will functional mobility techniques with min cues.  Skilled Therapeutic Interventions/Progress Updates:  Tx focused on neuromuscular re-education for trunk shortening/lengthening/rotating with reaching out of BOS in all directions in unsupported and RLE stance stability in standing with wt shifting and mini squats; facilitation of anterior pelvic tilt in supported sitting.  W/c legrests adjusted for decreased pressure on buttocks and sacrum.  Pt reported that he fell on his tailbone during one of his many falls around the time of the CVA; he is pending Xray today.  Ulice Dash cushion with sacral cut-out/gel insert provided for comfort and function.  Pt stated he has long-standing spinal OA, and R knee OA; R TKA.  He chooses thoracic and lumbar flexion and posterior pelvic as a position of comfort.    Therapy Documentation Precautions:  Precautions Precautions: Fall Precaution Comments: R sided hemiparesis and R knee buckling Restrictions Weight Bearing Restrictions: No Other Position/Activity Restrictions: fall risk   Pain: Pain Assessment Pain Assessment: No/denies pain      See FIM for current functional status  Therapy/Group: Individual Therapy  COOK,CAROLINE 01/21/2014, 12:29 PM

## 2014-01-21 NOTE — Progress Notes (Signed)
Social Work Assessment and Plan Social Work Assessment and Plan  Patient Details  Name: Maurice Stone MRN: [id number] Date of Birth: 1940-10-11  Today's Date: 01/21/2014  Problem List:  Patient Active Problem List   Diagnosis Date Noted  . CVA (cerebral infarction) 01/18/2014  . Acute CVA (cerebrovascular accident) 01/17/2014  . CVA (cerebral vascular accident) 01/15/2014  . Essential hypertension, benign 01/15/2014  . Bradycardia 01/15/2014  . chronic Right sided weakness 01/15/2014  . GERD (gastroesophageal reflux disease) 01/15/2014  . Neuropathy 01/15/2014   Past Medical History:  Past Medical History  Diagnosis Date  . Hypertension   . Diabetes mellitus without complication   . Coronary artery disease   . COPD (chronic obstructive pulmonary disease)   . Stroke    Past Surgical History: No past surgical history on file. Social History:  reports that he quit smoking about 41 years ago. His smoking use included Cigarettes. He smoked 0.00 packs per day for 10 years. He does not have any smokeless tobacco history on file. He reports that he drinks about .6 ounces of alcohol per week. He reports that he does not use illicit drugs.  Family / Support Systems Marital Status: Separated Patient Roles: Parent;Other (Comment) (retiree) Children: Santiago Glad B[place]-daughter  [phone]-cell Other Supports: Marijo Conception  [phone]-home  [phone]-cell Anticipated Caregiver: Patient and chidlren to assist Ability/Limitations of Caregiver: Children both work, daughter out of town, son is Administrator, Civil Service Availability: Other (Comment) (Coming up with a plan) Family Dynamics: Pt is close with son and daughter, along with friends.  He reports he has been falling at home even before this happened.  He hurt his tailbone and it still is bothering him.  He can rely upon his children but does not want to burden them with his care.  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   Abuse/Neglect Physical Abuse: Denies Verbal Abuse: Denies Sexual Abuse: Denies Exploitation of patient/patient's resources: Denies Self-Neglect: Denies  Emotional Status Pt's affect, behavior adn adjustment status: Pt is motivated to improve, but wants to go home alone like he was.  He feels his children can check on him but not stay with.  He has always been able to manage himself and plans to do so again.  He is hopeful he will do well in therapies here. Recent Psychosocial Issues: Other health issues-had a CVA 2009 that left him with speech issues, he also has been falling at home prior to admission Pyschiatric History: History of depression takes multiple medicines and is followed at the [place] for this.  He feels he is doing ok with, but would probably benefit from Neuro-psych intervention while here.  Will make referral and provide support also while here. Substance Abuse History: Quit tobacco, no other issues  Patient / Family Perceptions, Expectations & Goals Pt/Family understanding of illness & functional limitations: Pt can explain his stroke and reports his knee buckles.  He wants to be safer before he goes home due to he was falling at home prior to admission here.  He has been through this before and made progress and he is hopeful again.  Hopefully he will make good progress while here. Premorbid pt/family roles/activities: father, retiree,  Friend, Church Member, etc Anticipated changes in roles/activities/participation: resume Pt/family expectations/goals: Pt states: " I want to be independent to go home, I need to quit  falling."  [employer]: Other (Comment) (VA services) Premorbid Home Care/DME Agencies: None Transportation available at discharge: Family Resource referrals recommended: Support group (specify) (CVA SUpport group)  Discharge Planning Living Arrangements: Alone Support Systems: Children;Friends/neighbors;Church/faith community Type of Residence: Private residence Insurance Resources: [employer] (specify);Medicare (VA primary insurance) Financial Resources: Other (Comment);Social Security ([employer] pension) Financial Screen Referred: No Living Expenses: Own Money Management: Patient Does the patient have any problems obtaining your medications?: No Home Management: Patient Patient/Family Preliminary Plans: Return home alone with intermittent assist from children, he does not have 24 hr care.  Both children work and can not provide care to pt.  He was falling prior to admission so therefore had safety concerns before this stroke.  Team would have recommended supervision PTA due to his falling at home. Social Work Anticipated Follow Up Needs: HH/OP;Support Group  Clinical Impression Pleasant gentleman who wants to do well here, but has issues from his past stroke in 2009.  He has been falling prior to admission at home and now has had another stroke. Children are supportive but can not provide 24 hr care.  Will try to come up with a safe discharge plan, await pt's progress.  He wants to return home from here.  [name] 01/21/2014, 11:59 AM

## 2014-01-21 NOTE — Progress Notes (Signed)
Occupational Therapy Session Note  Patient Details  Name: Maurice Stone MRN: [id number] Date of Birth: [birth year]  Today's Date: 01/21/2014 Time: 0800-0900 Time Calculation (min): 60 min  Short Term Goals: Week 1:  OT Short Term Goal 1 (Week 1): Patient will be supervision for bathing OT Short Term Goal 2 (Week 1): Patient will be min a for dressing OT Short Term Goal 3 (Week 1): Patient will be min a for toilet transfers OT Short Term Goal 4 (Week 1): Patient will be min a for shower transfers OT Short Term Goal 5 (Week 1): Patient will be supervision for toileting  Skilled Therapeutic Interventions/Progress Updates:  Patient resting in bed upon arrival.  Engaged in self care retraining to include shower, dress in hospital gown (family to bring in clothes), and grooming.  Focused session on dynamic balance while ambulate bed>toilet>shower>stand at sink>recliner and during dynamic sitting and standing balance during BADL tasks.  Facilitation required secondary to decreased ability to sustain RLE muscle activation during stand, squat, direction changes.  Facilitated heavy weight bearing onto RLE and weight shifting as well as RUE coordination exercises.  Therapy Documentation Precautions:  Precautions Precautions: Fall Precaution Comments: R sided hemiparesis and R knee buckling Restrictions Weight Bearing Restrictions: No Other Position/Activity Restrictions: fall risk Pain: 5/10 tailbone, RN aware, rest repositioned ADL: See FIM for current functional status  Therapy/Group: Individual Therapy  SHAFFER, [place] 01/21/2014, 11:03 AM

## 2014-01-21 NOTE — Progress Notes (Signed)
73 y.o. right handed male with history of hypertension, diabetes mellitus, CAD,COPD as well as CVA 6 years ago with some residual slurred speech maintained on aspirin. Patient lives alone independent prior to admission. Presented 01/15/2014 with frequent falls and right-sided weakness and slurred speech. MRI shows acute nonhemorrhagic left paramedian pontine infarct as well as remote infarct left basal ganglia. MRA of the head without stenosis or occlusion. Echocardiogram with ejection fraction 54% grade 1 diastolic dysfunction. Carotid Dopplers no ICA stenosis. Patient did not receive TPA. Neurology services consulted aspirin increased to 325 mg daily   Subjective/Complaints: Tailbone pain from fall prior to CVA, seen at [place], no other c/os  Review of Systems - Negative except tailbone pain  Objective: Vital Signs: Blood pressure 133/65, pulse 50, temperature 97.4 F (36.3 C), temperature source Oral, resp. rate 18, weight 92.9 kg (204 lb 12.9 oz), SpO2 97.00%. No results found. Results for orders placed during the hospital encounter of 01/18/14 (from the past 72 hour(s))  GLUCOSE, CAPILLARY     Status: Abnormal   Collection Time    01/18/14  4:25 PM      Result Value Ref Range   Glucose-Capillary 235 (*) 70 - 99 mg/dL   Comment 1 Notify RN    CBC     Status: Abnormal   Collection Time    01/18/14  8:17 PM      Result Value Ref Range   WBC 7.4  4.0 - 10.5 K/uL   RBC 4.68  4.22 - 5.81 MIL/uL   Hemoglobin 14.3  13.0 - 17.0 g/dL   HCT 42.4  39.0 - 52.0 %   MCV 90.6  78.0 - 100.0 fL   MCH 30.6  26.0 - 34.0 pg   MCHC 33.7  30.0 - 36.0 g/dL   RDW 14.0  11.5 - 15.5 %   Platelets 123 (*) 150 - 400 K/uL  CREATININE, SERUM     Status: Abnormal   Collection Time    01/18/14  8:17 PM      Result Value Ref Range   Creatinine, Ser 1.11  0.50 - 1.35 mg/dL   GFR calc non Af Amer 65 (*) >90 mL/min   GFR calc Af Amer 75 (*) >90 mL/min   Comment: (NOTE)     The eGFR has been calculated using the  CKD EPI equation.     This calculation has not been validated in all clinical situations.     eGFR's persistently <90 mL/min signify possible Chronic Kidney     Disease.  GLUCOSE, CAPILLARY     Status: Abnormal   Collection Time    01/18/14  9:00 PM      Result Value Ref Range   Glucose-Capillary 165 (*) 70 - 99 mg/dL  GLUCOSE, CAPILLARY     Status: Abnormal   Collection Time    01/19/14  7:38 AM      Result Value Ref Range   Glucose-Capillary 149 (*) 70 - 99 mg/dL  GLUCOSE, CAPILLARY     Status: Abnormal   Collection Time    01/19/14 11:58 AM      Result Value Ref Range   Glucose-Capillary 178 (*) 70 - 99 mg/dL  GLUCOSE, CAPILLARY     Status: Abnormal   Collection Time    01/19/14  4:39 PM      Result Value Ref Range   Glucose-Capillary 183 (*) 70 - 99 mg/dL  GLUCOSE, CAPILLARY     Status: Abnormal   Collection Time  01/19/14  8:46 PM      Result Value Ref Range   Glucose-Capillary 136 (*) 70 - 99 mg/dL  GLUCOSE, CAPILLARY     Status: Abnormal   Collection Time    01/20/14  7:36 AM      Result Value Ref Range   Glucose-Capillary 139 (*) 70 - 99 mg/dL  GLUCOSE, CAPILLARY     Status: Abnormal   Collection Time    01/20/14 11:39 AM      Result Value Ref Range   Glucose-Capillary 148 (*) 70 - 99 mg/dL  GLUCOSE, CAPILLARY     Status: Abnormal   Collection Time    01/20/14  4:32 PM      Result Value Ref Range   Glucose-Capillary 211 (*) 70 - 99 mg/dL  GLUCOSE, CAPILLARY     Status: Abnormal   Collection Time    01/20/14  9:07 PM      Result Value Ref Range   Glucose-Capillary 189 (*) 70 - 99 mg/dL  CBC WITH DIFFERENTIAL     Status: Abnormal   Collection Time    01/21/14  5:00 AM      Result Value Ref Range   WBC 8.3  4.0 - 10.5 K/uL   RBC 4.55  4.22 - 5.81 MIL/uL   Hemoglobin 13.8  13.0 - 17.0 g/dL   HCT 41.9  39.0 - 52.0 %   MCV 92.1  78.0 - 100.0 fL   MCH 30.3  26.0 - 34.0 pg   MCHC 32.9  30.0 - 36.0 g/dL   RDW 14.5  11.5 - 15.5 %   Platelets 125 (*) 150 -  400 K/uL   Neutrophils Relative % 50  43 - 77 %   Neutro Abs 4.1  1.7 - 7.7 K/uL   Lymphocytes Relative 37  12 - 46 %   Lymphs Abs 3.0  0.7 - 4.0 K/uL   Monocytes Relative 11  3 - 12 %   Monocytes Absolute 0.9  0.1 - 1.0 K/uL   Eosinophils Relative 2  0 - 5 %   Eosinophils Absolute 0.2  0.0 - 0.7 K/uL   Basophils Relative 0  0 - 1 %   Basophils Absolute 0.0  0.0 - 0.1 K/uL  COMPREHENSIVE METABOLIC PANEL     Status: Abnormal   Collection Time    01/21/14  5:00 AM      Result Value Ref Range   Sodium 139  137 - 147 mEq/L   Potassium 4.6  3.7 - 5.3 mEq/L   Chloride 102  96 - 112 mEq/L   CO2 24  19 - 32 mEq/L   Glucose, Bld 129 (*) 70 - 99 mg/dL   BUN 15  6 - 23 mg/dL   Creatinine, Ser 1.01  0.50 - 1.35 mg/dL   Calcium 9.3  8.4 - 10.5 mg/dL   Total Protein 6.8  6.0 - 8.3 g/dL   Albumin 3.3 (*) 3.5 - 5.2 g/dL   AST 25  0 - 37 U/L   ALT 26  0 - 53 U/L   Alkaline Phosphatase 83  39 - 117 U/L   Total Bilirubin 0.4  0.3 - 1.2 mg/dL   GFR calc non Af Amer 73 (*) >90 mL/min   GFR calc Af Amer 84 (*) >90 mL/min   Comment: (NOTE)     The eGFR has been calculated using the CKD EPI equation.     This calculation has not been validated in all clinical situations.  eGFR's persistently <90 mL/min signify possible Chronic Kidney     Disease.  GLUCOSE, CAPILLARY     Status: Abnormal   Collection Time    01/21/14  7:23 AM      Result Value Ref Range   Glucose-Capillary 122 (*) 70 - 99 mg/dL   Comment 1 Notify RN       HEENT: normal Cardio: RRR Resp: CTA B/L GI: BS positive and NT, ND Extremity:  No Edema Skin:   Intact Neuro: Alert/Oriented, Cranial Nerve II-XII normal, Normal Sensory, Normal Motor and Abnormal FMC Ataxic/ dec FMC Musc/Skel:  Other sacral and coccygeal tenderness Gen NAD   Assessment/Plan: 1. Functional deficits secondary to Left pontine infarct which require 3+ hours per day of interdisciplinary therapy in a comprehensive inpatient rehab setting. Physiatrist  is providing close team supervision and 24 hour management of active medical problems listed below. Physiatrist and rehab team continue to assess barriers to discharge/monitor patient progress toward functional and medical goals. FIM: FIM - Bathing Bathing Steps Patient Completed: Chest;Right Arm;Left Arm;Abdomen Bathing: 2: Max-Patient completes 3-4 54f 10 parts or 25-49%  FIM - Upper Body Dressing/Undressing Upper body dressing/undressing: 0: Wears gown/pajamas-no public clothing FIM - Lower Body Dressing/Undressing Lower body dressing/undressing: 0: Wears Oceanographer  FIM - Hotel manager Devices: Grab bar or rail for support Toileting: 1: Total-Patient completed zero steps, helper did all 3  FIM - Diplomatic Services operational officer Devices: Grab bars Toilet Transfers: 2-To toilet/BSC: Max A (lift and lower assist);2-From toilet/BSC: Max A (lift and lower assist)  FIM - Press photographer Assistive Devices: Bed rails;Arm rests Bed/Chair Transfer: 2: Chair or W/C > Bed: Max A (lift and lower assist);4: Sit > Supine: Min A (steadying pt. > 75%/lift 1 leg);2: Bed > Chair or W/C: Max A (lift and lower assist)  FIM - Locomotion: Wheelchair Distance: 10 Locomotion: Wheelchair: 1: Travels less than 50 ft with minimal assistance (Pt.>75%) FIM - Locomotion: Ambulation Ambulation/Gait Assistance: 1: +2 Total assist;3: Mod assist (+2 for w/c follow; modA for actual gait) Locomotion: Ambulation: 1: Two helpers  Comprehension Comprehension Mode: Auditory Comprehension: 5-Follows basic conversation/direction: With no assist  Expression Expression Mode: Verbal Expression: 5-Expresses basic needs/ideas: With no assist  Social Interaction Social Interaction: 6-Interacts appropriately with others with medication or extra time (anti-anxiety, antidepressant).  Problem Solving Problem Solving: 5-Solves basic problems: With no  assist  Memory Memory: 5-Recognizes or recalls 90% of the time/requires cueing < 10% of the time  Medical Problem List and Plan:  1. Functional deficits secondary to acute left paramedian pontine infarct  2. DVT Prophylaxis/Anticoagulation: Subcutaneous heparin. Monitor platelet counts and any signs of bleeding.  3. Pain Management: Clinoril 150 mg twice a day, Neurontin 600 mg 3 times a day, Hydrocodone as needed. Monitor with increased mobility ,  4. Mood/memory loss/depression: Wellbutrin 100 mg twice a day. Aricept 10 mg each bedtime, Seroquel 200 mg each bedtime, Zoloft 200 mg daily.  5. Neuropsych: This patient is capable of making decisions on his own behalf.  6. Hypertension. Norvasc 10 mg daily, Cozaar 100 mg daily, Lopressor 50 mg twice a day. Monitor with increased mobility  7. Diabetes mellitus with peripheral neuropathy. Hemoglobin A1c 10.6. Lantus insulin 40 units twice a day, NovoLog 12 units 3 times a day. Check blood sugars a.c. and at bedtime. Provide diabetic teaching  8. Hyperlipidemia. Lipitor  9.  Sacral pain pt reports xrays at [facility] will repeat, no WB restrictions   LOS (Days)  3 A FACE TO FACE EVALUATION WAS PERFORMED  KIRSTEINS,ANDREW E 01/21/2014, 7:37 AM

## 2014-01-21 NOTE — IPOC Note (Addendum)
Overall Plan of Care [facility]) Patient Details Name: Maurice Stone MRN: [id number] DOB: 1941/03/20  Admitting Diagnosis: L pons CVA  Hospital Problems: Active Problems:   CVA (cerebral infarction)     Functional Problem List: Nursing Endurance;Nutrition;Safety  PT Balance;Endurance;Motor;Pain;Perception;Safety  OT Balance;Cognition;Endurance;Motor;Pain;Safety  SLP Cognition  TR Activity tolerance, functional mobility, balance, cognition, safety, pain       Basic ADL's: OT Bathing;Dressing;Toileting     Advanced  ADL's: OT       Transfers: PT Bed Mobility;Bed to Chair;Car;Furniture;Floor  OT Toilet;Tub/Shower     Locomotion: PT Ambulation;Wheelchair Mobility;Stairs     Additional Impairments: OT Fuctional Use of Upper Extremity  SLP        TR      Anticipated Outcomes Item Anticipated Outcome  Self Feeding    Swallowing  independent with regular solids, thin liquids   Basic self-care  s  Toileting  s   Bathroom Transfers s  Bowel/Bladder  Mod I  Transfers  supervision  Locomotion  supervision  Communication  modfied independent with sentence-conversational level speech intelligiblity.  Cognition  modified independent for basic, supervision for complex problem solving/reasoning  Pain  <3  Safety/Judgment  Mod I   Therapy Plan: PT Intensity: Minimum of 1-2 x/day ,45 to 90 minutes PT Frequency: 5 out of 7 days PT Duration Estimated Length of Stay: 10-12 days OT Intensity: Minimum of 1-2 x/day, 45 to 90 minutes OT Frequency: 5 out of 7 days OT Duration/Estimated Length of Stay: 10-12 days SLP Frequency: 5 out of 7 days SLP Duration/Estimated Length of Stay: 10-14 days  T2R Frequency: MIn 1 time per week >81minutes TR Duration/ELOS: 12 days     Team Interventions: Nursing Interventions Patient/Family Education;Disease Management/Prevention  PT interventions Ambulation/gait training;Disease management/prevention;Pain management;Stair  training;Visual/perceptual remediation/compensation;Wheelchair propulsion/positioning;Therapeutic Activities;Patient/family education;DME/adaptive equipment instruction;Balance/vestibular training;Cognitive remediation/compensation;Psychosocial support;Therapeutic Exercise;UE/LE Strength taining/ROM;Functional mobility training;Community reintegration;Discharge planning;Neuromuscular re-education;Splinting/orthotics;UE/LE Coordination activities  OT Interventions Balance/vestibular training;Cognitive remediation/compensation;Discharge planning;Community reintegration;Disease mangement/prevention;DME/adaptive equipment instruction;Functional mobility training;Neuromuscular re-education;Pain management;Patient/family education;Psychosocial support;Self Care/advanced ADL retraining;Therapeutic Activities;Therapeutic Exercise;UE/LE Strength taining/ROM;UE/LE Coordination activities  SLP Interventions Cognitive remediation/compensation;Speech/Language facilitation;Functional tasks;Cueing hierarchy;Patient/family education;Internal/external aids  TR Interventions Recreation/leisure participation, Balance/Vestibular training, functional mobility, therapeutic activities, UE/LE strength/coordination, w/c mobility, community reintegration, pt/family education, adaptive equipment instruction/use, discharge planning, psychosocial support   SW/CM Interventions Discharge Planning;Psychosocial Support;Patient/Family Education    Team Discharge Planning: Destination: PT-Home ,OT- Home , SLP-Home Projected Follow-up: PT-24 hour supervision/assistance;Home health PT, OT-  Outpatient OT, SLP-Home Health SLP (pending progress, may benefit from [facility] SLP following CIR discharge) Projected Equipment Needs: PT-To be determined, OT- To be determined, SLP-  Equipment Details: PT-Patient owns RW and [facility], further recommendations TBD upon discharge, OT-  Patient/family involved in discharge planning: PT- Patient,  OT-Patient,  SLP-Patient  MD ELOS: 7-10days Medical Rehab Prognosis:  Excellent Assessment: 73 y.o. right handed male with history of hypertension, diabetes mellitus, CAD,COPD as well as CVA 6 years ago with some residual slurred speech maintained on aspirin. Patient lives alone independent prior to admission. Presented 01/15/2014 with frequent falls and right-sided weakness and slurred speech. MRI shows acute nonhemorrhagic left paramedian pontine infarct as well as remote infarct left basal ganglia. MRA of the head without stenosis or occlusion. Echocardiogram with ejection fraction 10% grade 1 diastolic dysfunction   Now requiring CIR level PT,OT,SLP, 24/7 Rehab RN and MD. working on balance, strengthening, ADLs, medication management    See Team Conference Notes for weekly updates to the plan of care

## 2014-01-21 NOTE — Progress Notes (Signed)
Occupational Therapy Session Note  Patient Details  Name: Maurice Stone MRN: [id number] Date of Birth: [birth year]  Today's Date: 01/21/2014 Time: 1130-1200 Time Calculation (min): 30 min  Short Term Goals: Week 1:  OT Short Term Goal 1 (Week 1): Patient will be supervision for bathing OT Short Term Goal 2 (Week 1): Patient will be min a for dressing OT Short Term Goal 3 (Week 1): Patient will be min a for toilet transfers OT Short Term Goal 4 (Week 1): Patient will be min a for shower transfers OT Short Term Goal 5 (Week 1): Patient will be supervision for toileting  Skilled Therapeutic Interventions/Progress Updates: ADL-retraining with focus on standing tolerance, dynamic standing balance, endurance, fine motor control of R-UE.  Patient completed shaving, standing at sink, with steadying assist throughout task due to right LE weakness.   Patient was aware of weakness resulting in gradual flexion at right side (trunk to knee) but persevered during task, self-correcting intermittently to improve extension of limbs and trunk.  Patient oriented to place [facility]) with task of initiating washing and drying soiled clothing.   Clothing left in bag w/pt's name & room #, with RN tech notified for continued assist, as needed.     Therapy Documentation Precautions:  Precautions Precautions: Fall Precaution Comments: R sided hemiparesis and R knee buckling Restrictions Weight Bearing Restrictions: No Other Position/Activity Restrictions: fall risk  Pain: Pain Assessment Pain Score: 4   See FIM for current functional status  Therapy/Group: Individual Therapy  [place] 01/21/2014, 12:19 PM

## 2014-01-21 NOTE — Care Management Note (Signed)
[place] Individual Statement of Services  Patient Name:  Maurice Stone  Date:  01/21/2014  Welcome to the [facility].  Our goal is to provide you with an individualized program based on your diagnosis and situation, designed to meet your specific needs.  With this comprehensive rehabilitation program, you will be expected to participate in at least 3 hours of rehabilitation therapies Monday-Friday, with modified therapy programming on the weekends.  Your rehabilitation program will include the following services:  Physical Therapy (PT), Occupational Therapy (OT), Speech Therapy (ST), 24 hour per day rehabilitation nursing, Neuropsychology, Case Management (Social Worker), Rehabilitation Medicine, Nutrition Services and Pharmacy Services  Weekly team conferences will be held on Wednesday to discuss your progress.  Your Social Worker will talk with you frequently to get your input and to update you on team discussions.  Team conferences with you and your family in attendance may also be held.  Expected length of stay: 10-12 days Overall anticipated outcome: supervision with cues  Depending on your progress and recovery, your program may change. Your Social Worker will coordinate services and will keep you informed of any changes. Your Social Worker's name and contact numbers are listed  below.  The following services may also be recommended but are not provided by the [facility] will be made to provide these services after discharge if needed.  Arrangements include referral to agencies that provide these services.  Your insurance has been verified to be:  [place] [employer] A-only Your primary doctor is:  [facility]  Pertinent information will be shared with your doctor and your insurance company.  Social Worker:   Ovidio Kin, [place] or [phone]  Information discussed with and copy given to patient by: [name], 01/21/2014, 10:43 AM

## 2014-01-21 NOTE — Progress Notes (Signed)
Speech Language Pathology Daily Session Note  Patient Details  Name: Maurice Stone MRN: [id number] Date of Birth: 09-Dec-1940  Today's Date: 01/21/2014 Time: 0920-1000 Time Calculation (min): 40 min  Short Term Goals: Week 1: SLP Short Term Goal 1 (Week 1): Patient will perform respiratory support/control/coordination exercises with 85% accuracy with moderate cues. SLP Short Term Goal 2 (Week 1): Patient will utilize learned speech strategies to increase speech intelligiblity and quality to 90% at sentence level. SLP Short Term Goal 3 (Week 1): Patient will recall and demonstrate understanding of learned strategies/safety precautions/exercises by verbalizing and return-demonstrating with 80% accuracy.  Skilled Therapeutic Interventions: Skilled treatment session focused on addressing dysarthria goals and education regarding diaphragmatic breathing.  Patient educated on diaphragmatic breathing with ability to return demonstration during an isolated task prior to use during structured speech tasks.  Patient required Min cues for coordination and attention to breathing/speaking pattern for best vocal intensity as well as Supervision for use of speech intelligibility compensatory strategies at the sentences level of verbal expression.  Continue with current plan of care.    FIM:  Comprehension Comprehension Mode: Auditory Comprehension: 5-Follows basic conversation/direction: With no assist Expression Expression Mode: Verbal Expression: 5-Expresses basic needs/ideas: With extra time/assistive device Social Interaction Social Interaction: 6-Interacts appropriately with others with medication or extra time (anti-anxiety, antidepressant). Problem Solving Problem Solving: 5-Solves basic problems: With no assist Memory Memory: 5-Recognizes or recalls 90% of the time/requires cueing < 10% of the time  Pain Pain Assessment Pain Assessment: No/denies pain  Therapy/Group: Individual  Therapy  Carmelia Roller., CCC-SLP [phone]  [place] 01/21/2014, 12:35 PM

## 2014-01-22 ENCOUNTER — Inpatient Hospital Stay (HOSPITAL_COMMUNITY): Payer: Medicare Other | Admitting: Occupational Therapy

## 2014-01-22 ENCOUNTER — Inpatient Hospital Stay (HOSPITAL_COMMUNITY): Payer: Medicare Other | Admitting: *Deleted

## 2014-01-22 ENCOUNTER — Inpatient Hospital Stay (HOSPITAL_COMMUNITY): Payer: Medicare Other | Admitting: Speech Pathology

## 2014-01-22 LAB — GLUCOSE, CAPILLARY
GLUCOSE-CAPILLARY: 166 mg/dL — AB (ref 70–99)
GLUCOSE-CAPILLARY: 167 mg/dL — AB (ref 70–99)
Glucose-Capillary: 152 mg/dL — ABNORMAL HIGH (ref 70–99)
Glucose-Capillary: 198 mg/dL — ABNORMAL HIGH (ref 70–99)

## 2014-01-22 MED ORDER — HYDROCODONE-ACETAMINOPHEN 5-325 MG PO TABS
2.0000 | ORAL_TABLET | Freq: Four times a day (QID) | ORAL | Status: DC | PRN
  Administered 2014-01-22 – 2014-01-29 (×9): 2 via ORAL
  Filled 2014-01-22 (×9): qty 2

## 2014-01-22 MED ORDER — BIOTENE DRY MOUTH MT LIQD
15.0000 mL | Freq: Two times a day (BID) | OROMUCOSAL | Status: DC
  Administered 2014-01-22 – 2014-02-02 (×23): 15 mL via OROMUCOSAL

## 2014-01-22 MED ORDER — METOPROLOL TARTRATE 25 MG PO TABS
25.0000 mg | ORAL_TABLET | Freq: Two times a day (BID) | ORAL | Status: DC
  Administered 2014-01-22 – 2014-02-02 (×23): 25 mg via ORAL
  Filled 2014-01-22 (×25): qty 1

## 2014-01-22 NOTE — Progress Notes (Signed)
73 y.o. right handed male with history of hypertension, diabetes mellitus, CAD,COPD as well as CVA 6 years ago with some residual slurred speech maintained on aspirin. Patient lives alone independent prior to admission. Presented 01/15/2014 with frequent falls and right-sided weakness and slurred speech. MRI shows acute nonhemorrhagic left paramedian pontine infarct as well as remote infarct left basal ganglia. MRA of the head without stenosis or occlusion. Echocardiogram with ejection fraction 93% grade 1 diastolic dysfunction. Carotid Dopplers no ICA stenosis. Patient did not receive TPA. Neurology services consulted aspirin increased to 325 mg daily   Subjective/Complaints: Had fall prior to CVA seen at [facility] and told he had fx of "tailbone" Tailbone pain unrelieved with current pain med Review of Systems - Negative except tailbone pain  Objective: Vital Signs: Blood pressure 133/72, pulse 45, temperature 98 F (36.7 C), temperature source Oral, resp. rate 18, weight 92.9 kg (204 lb 12.9 oz), SpO2 95.00%. Dg Sacrum/coccyx  01/21/2014   CLINICAL DATA:  Sacral pain after fall.  EXAM: SACRUM AND COCCYX - 2+ VIEW  COMPARISON:  None.  FINDINGS: Sacroiliac joints appear normal. Mildly displaced fracture is seen involving the distal portion of the sacrum just above the coccyx.  IMPRESSION: Mildly displaced fracture involving the distal portion of the sacrum.   Electronically Signed   By: Sabino Dick M.D.   On: 01/21/2014 13:30   Results for orders placed during the hospital encounter of 01/18/14 (from the past 72 hour(s))  GLUCOSE, CAPILLARY     Status: Abnormal   Collection Time    01/19/14  7:38 AM      Result Value Ref Range   Glucose-Capillary 149 (*) 70 - 99 mg/dL  GLUCOSE, CAPILLARY     Status: Abnormal   Collection Time    01/19/14 11:58 AM      Result Value Ref Range   Glucose-Capillary 178 (*) 70 - 99 mg/dL  GLUCOSE, CAPILLARY     Status: Abnormal   Collection Time    01/19/14  4:39 PM       Result Value Ref Range   Glucose-Capillary 183 (*) 70 - 99 mg/dL  GLUCOSE, CAPILLARY     Status: Abnormal   Collection Time    01/19/14  8:46 PM      Result Value Ref Range   Glucose-Capillary 136 (*) 70 - 99 mg/dL  GLUCOSE, CAPILLARY     Status: Abnormal   Collection Time    01/20/14  7:36 AM      Result Value Ref Range   Glucose-Capillary 139 (*) 70 - 99 mg/dL  GLUCOSE, CAPILLARY     Status: Abnormal   Collection Time    01/20/14 11:39 AM      Result Value Ref Range   Glucose-Capillary 148 (*) 70 - 99 mg/dL  GLUCOSE, CAPILLARY     Status: Abnormal   Collection Time    01/20/14  4:32 PM      Result Value Ref Range   Glucose-Capillary 211 (*) 70 - 99 mg/dL  GLUCOSE, CAPILLARY     Status: Abnormal   Collection Time    01/20/14  9:07 PM      Result Value Ref Range   Glucose-Capillary 189 (*) 70 - 99 mg/dL  CBC WITH DIFFERENTIAL     Status: Abnormal   Collection Time    01/21/14  5:00 AM      Result Value Ref Range   WBC 8.3  4.0 - 10.5 K/uL   RBC 4.55  4.22 -  5.81 MIL/uL   Hemoglobin 13.8  13.0 - 17.0 g/dL   HCT 41.9  39.0 - 52.0 %   MCV 92.1  78.0 - 100.0 fL   MCH 30.3  26.0 - 34.0 pg   MCHC 32.9  30.0 - 36.0 g/dL   RDW 14.5  11.5 - 15.5 %   Platelets 125 (*) 150 - 400 K/uL   Neutrophils Relative % 50  43 - 77 %   Neutro Abs 4.1  1.7 - 7.7 K/uL   Lymphocytes Relative 37  12 - 46 %   Lymphs Abs 3.0  0.7 - 4.0 K/uL   Monocytes Relative 11  3 - 12 %   Monocytes Absolute 0.9  0.1 - 1.0 K/uL   Eosinophils Relative 2  0 - 5 %   Eosinophils Absolute 0.2  0.0 - 0.7 K/uL   Basophils Relative 0  0 - 1 %   Basophils Absolute 0.0  0.0 - 0.1 K/uL  COMPREHENSIVE METABOLIC PANEL     Status: Abnormal   Collection Time    01/21/14  5:00 AM      Result Value Ref Range   Sodium 139  137 - 147 mEq/L   Potassium 4.6  3.7 - 5.3 mEq/L   Chloride 102  96 - 112 mEq/L   CO2 24  19 - 32 mEq/L   Glucose, Bld 129 (*) 70 - 99 mg/dL   BUN 15  6 - 23 mg/dL   Creatinine, Ser 1.01   0.50 - 1.35 mg/dL   Calcium 9.3  8.4 - 10.5 mg/dL   Total Protein 6.8  6.0 - 8.3 g/dL   Albumin 3.3 (*) 3.5 - 5.2 g/dL   AST 25  0 - 37 U/L   ALT 26  0 - 53 U/L   Alkaline Phosphatase 83  39 - 117 U/L   Total Bilirubin 0.4  0.3 - 1.2 mg/dL   GFR calc non Af Amer 73 (*) >90 mL/min   GFR calc Af Amer 84 (*) >90 mL/min   Comment: (NOTE)     The eGFR has been calculated using the CKD EPI equation.     This calculation has not been validated in all clinical situations.     eGFR's persistently <90 mL/min signify possible Chronic Kidney     Disease.  GLUCOSE, CAPILLARY     Status: Abnormal   Collection Time    01/21/14  7:23 AM      Result Value Ref Range   Glucose-Capillary 122 (*) 70 - 99 mg/dL   Comment 1 Notify RN    GLUCOSE, CAPILLARY     Status: Abnormal   Collection Time    01/21/14 11:18 AM      Result Value Ref Range   Glucose-Capillary 117 (*) 70 - 99 mg/dL   Comment 1 Notify RN    GLUCOSE, CAPILLARY     Status: Abnormal   Collection Time    01/21/14  4:14 PM      Result Value Ref Range   Glucose-Capillary 164 (*) 70 - 99 mg/dL  GLUCOSE, CAPILLARY     Status: Abnormal   Collection Time    01/21/14  8:36 PM      Result Value Ref Range   Glucose-Capillary 188 (*) 70 - 99 mg/dL     HEENT: normal Cardio: RRR Resp: CTA B/L GI: BS positive and NT, ND Extremity:  No Edema Skin:   Intact Neuro: Alert/Oriented, Cranial Nerve II-XII normal, Normal Sensory, Normal Motor  and Abnormal FMC Ataxic/ dec FMC Musc/Skel:  Other sacral and coccygeal tenderness Gen NAD   Assessment/Plan: 1. Functional deficits secondary to Left pontine infarct which require 3+ hours per day of interdisciplinary therapy in a comprehensive inpatient rehab setting. Physiatrist is providing close team supervision and 24 hour management of active medical problems listed below. Physiatrist and rehab team continue to assess barriers to discharge/monitor patient progress toward functional and medical  goals. FIM: FIM - Bathing Bathing Steps Patient Completed: Chest;Right Arm;Left Arm;Abdomen Bathing: 2: Max-Patient completes 3-4 20f10 parts or 25-49%  FIM - Upper Body Dressing/Undressing Upper body dressing/undressing: 0: Wears gown/pajamas-no public clothing FIM - Lower Body Dressing/Undressing Lower body dressing/undressing: 0: Wears gInterior and spatial designer FIM - TMusicianDevices: Grab bar or rail for support Toileting: 1: Total-Patient completed zero steps, helper did all 3  FIM - TRadio producerDevices: Grab bars Toilet Transfers: 2-To toilet/BSC: Max A (lift and lower assist);2-From toilet/BSC: Max A (lift and lower assist)  FIM - BEngineer, siteAssistive Devices: Bed rails;Arm rests Bed/Chair Transfer: 3: Chair or W/C > Bed: Mod A (lift or lower assist);3: Bed > Chair or W/C: Mod A (lift or lower assist)  FIM - Locomotion: Wheelchair Distance: 10 Locomotion: Wheelchair: 5: Travels 150 ft or more: maneuvers on rugs and over door sills with supervision, cueing or coaxing FIM - Locomotion: Ambulation Ambulation/Gait Assistance: 1: +2 Total assist;3: Mod assist (+2 for w/c follow; modA for actual gait) Locomotion: Ambulation: 0: Activity did not occur  Comprehension Comprehension Mode: Auditory Comprehension: 5-Follows basic conversation/direction: With no assist  Expression Expression Mode: Verbal Expression: 5-Expresses basic needs/ideas: With no assist  Social Interaction Social Interaction: 6-Interacts appropriately with others with medication or extra time (anti-anxiety, antidepressant).  Problem Solving Problem Solving: 5-Solves basic problems: With no assist  Memory Memory: 5-Recognizes or recalls 90% of the time/requires cueing < 10% of the time  Medical Problem List and Plan:  1. Functional deficits secondary to acute left paramedian pontine infarct  2. DVT  Prophylaxis/Anticoagulation: Subcutaneous heparin. Monitor platelet counts and any signs of bleeding.  3. Pain Management: Clinoril 150 mg twice a day, Neurontin 600 mg 3 times a day, Hydrocodone with increase dose as needed. Monitor with increased mobility ,  4. Mood/memory loss/depression: Wellbutrin 100 mg twice a day. Aricept 10 mg each bedtime, Seroquel 200 mg each bedtime, Zoloft 200 mg daily.  5. Neuropsych: This patient is capable of making decisions on his own behalf.  6. Hypertension. Norvasc 10 mg daily, Cozaar 100 mg daily, Lopressor 50 mg twice a day.bradycardia reduce BB dose  7. Diabetes mellitus with peripheral neuropathy. Hemoglobin A1c 10.6. Lantus insulin 40 units twice a day, NovoLog 12 units 3 times a day. Check blood sugars a.c. and at bedtime. Provide diabetic teaching  8. Hyperlipidemia. Lipitor  9.  Sacral pain pt reports [facility] repeat confirms sacrococcygeal jct fx, analgesics and avoid direct pressure  LOS (Days) 4 A FACE TO FACE EVALUATION WAS PERFORMED  KIRSTEINS,ANDREW E 01/22/2014, 6:44 AM

## 2014-01-22 NOTE — Progress Notes (Signed)
Speech Language Pathology Daily Session Note  Patient Details  Name: Maurice Stone MRN: [id number] Date of Birth: 08/04/1940  Today's Date: 01/22/2014 Time: 6644-0347 Time Calculation (min): 55 min  Short Term Goals: Week 1: SLP Short Term Goal 1 (Week 1): Patient will perform respiratory support/control/coordination exercises with 85% accuracy with moderate cues. SLP Short Term Goal 2 (Week 1): Patient will utilize learned speech strategies to increase speech intelligiblity and quality to 90% at sentence level. SLP Short Term Goal 3 (Week 1): Patient will recall and demonstrate understanding of learned strategies/safety precautions/exercises by verbalizing and return-demonstrating with 80% accuracy.  Skilled Therapeutic Interventions: Skilled treatment session focused on addressing dysarthria goals and education regarding importance of self-monitoring and correcting.  SLP facilitated session with discussion regarding important speech production strategies; patient required Supervision cues to recall from yesterday's session.  Patient participated in structured phrase-sentence length verbal description task; SLP facilitated task with audio recorder and Min faded to Mod I cues to self-monitor and correct errors.  Audio recording resulted improved self-monitoring and analysis of imprecise consonant productions, which patient carried over to conversation at end of session.  SLP educated patient on recording app on his phone that he could use to self-monitor oral reading and speech production on his own.  Patient effectively returned use of app at end of session.  Continue with current plan of care.   FIM:  Comprehension Comprehension Mode: Auditory Comprehension: 5-Understands complex 90% of the time/Cues < 10% of the time Expression Expression Mode: Verbal Expression: 5-Expresses complex 90% of the time/cues < 10% of the time Social Interaction Social Interaction: 6-Interacts appropriately  with others with medication or extra time (anti-anxiety, antidepressant). Problem Solving Problem Solving: 5-Solves complex 90% of the time/cues < 10% of the time Memory Memory: 5-Recognizes or recalls 90% of the time/requires cueing < 10% of the time  Pain Pain Assessment Pain Assessment: No/denies pain  Therapy/Group: Individual Therapy  Carmelia Roller., CCC-SLP [phone]  [place] 01/22/2014, 10:09 AM

## 2014-01-22 NOTE — Progress Notes (Signed)
Physical Therapy Session Note  Patient Details  Name: Maurice Stone MRN: [id number] Date of Birth: [birth year]  Today's Date: 01/22/2014 Time: 11:07-11:53 (38min)  Short Term Goals: Week 1:  PT Short Term Goal 1 (Week 1): Patient will perform functional transfers with supervision. PT Short Term Goal 2 (Week 1): Patient will perform gait training 150' with LRAD and minA. PT Short Term Goal 3 (Week 1): Patient will negotiate 5 steps with one handrail and minA for community access. PT Short Term Goal 4 (Week 1): Patient will demonstrate carry over will functional mobility techniques with min cues.  Skilled Therapeutic Interventions/Progress Updates:  Tx focused on functional mobiltiy, WC propulsion, and NMR for righting reactions via forced use, manual facilitation, and verbal cues. Pt demonstrates decent isolated R knee strength, but motor impersistence predominates, accompanied by with R lean,which pt is largely unable to correct once fatigued. Pt needing frequent rest breaks after 1-68min standing. New WC cushion effective for comfort.   Pt propelled WC in controlled setting x80' with S cues for eficiency and stroke length, weaker stroke on L. Posterior pelvic noted.  Postural control tasks in sitting and standing with mirror for visual feedback. Performed seated balance on mat with WC cushion, reaching tasks outside BOS and upright posture.   Standing performed: -static stance - several posterior LOB without righting reaction - L/R weight shifts (R LOB)  - external perturbations with up to Max A for LOB recover to R side.   - step taps to 4" step R/L x20 counting aloud.  Pt left up in [facility] with lap belt and all needs in reach.        Therapy Documentation Precautions:  Precautions Precautions: Fall Precaution Comments: R sided hemiparesis and R knee buckling Restrictions Weight Bearing Restrictions: No Other Position/Activity Restrictions: fall risk    Vital Signs: Therapy  Vitals Pulse Rate: 53 BP: 108/63 mmHg Patient Position (if appropriate): Sitting Pain: Pain Assessment Pain Assessment: No/denies pain Pain Score: 7  Pain Type: Acute pain Pain Location: Coccyx Pain Descriptors / Indicators: Constant;Aching Pain Onset: On-going Pain Intervention(s): Medication (See eMAR)    Locomotion : Wheelchair Mobility Distance: 80   See FIM for current functional status  Therapy/Group: Individual Therapy Kennieth Rad, PT, DPT  01/22/2014, 11:26 AM

## 2014-01-22 NOTE — Progress Notes (Signed)
Recreational Therapy Assessment and Plan  Patient Details  Name: Maurice Stone MRN: [id number] Date of Birth: May 23, 1941 Today's Date: 01/22/2014  Rehab Potential: Good ELOS: 2 weeks   Assessment Clinical Impression: Problem List:  Patient Active Problem List    Diagnosis  Date Noted   .  CVA (cerebral infarction)  01/18/2014   .  Acute CVA (cerebrovascular accident)  01/17/2014   .  CVA (cerebral vascular accident)  01/15/2014   .  Essential hypertension, benign  01/15/2014   .  Bradycardia  01/15/2014   .  chronic Right sided weakness  01/15/2014   .  GERD (gastroesophageal reflux disease)  01/15/2014   .  Neuropathy  01/15/2014    Past Medical History:  Past Medical History   Diagnosis  Date   .  Hypertension    .  Diabetes mellitus without complication    .  Coronary artery disease    .  COPD (chronic obstructive pulmonary disease)    .  Stroke     Past Surgical History: No past surgical history on file.  Assessment & Plan  Clinical Impression: Maurice Stone is a 73 y.o. right handed male with history of hypertension, diabetes mellitus, CAD,COPD as well as CVA 6 years ago with some residual slurred speech maintained on aspirin. Patient lives alone independent prior to admission. Presented 01/15/2014 with frequent falls and right-sided weakness and slurred speech. MRI shows acute nonhemorrhagic left paramedian pontine infarct as well as remote infarct left basal ganglia. MRA of the head without stenosis or occlusion. Echocardiogram with ejection fraction 70% grade 1 diastolic dysfunction. Carotid Dopplers no ICA stenosis. Patient did not receive TPA. Neurology services consulted aspirin increased to 325 mg daily as well as the addition of subcutaneous heparin for DVT prophylaxis. Hemoglobin A1c of 10.6 with insulin therapy as directed. Patient is tolerating a regular consistency diet. Physical therapy evaluation completed 01/16/2014 with recommendations of physical medicine  rehabilitation consult. Patient was admitted for comprehensive rehabilitation program. Patient transferred to CIR on 01/18/2014.  Pt presents with decreased activity tolerance, decreased functional mobility, decreased balance, decreased coordination, decreased awareness, decreased problem solving, decreased safety awareness and decreased memory Limiting pt's independence with leisure/community pursuits.   Leisure History/Participation Premorbid leisure interest/current participation: [name] care;Nature - Vegetable gardening;Community - Grocery store;Community - Shopping mall Other Leisure Interests: Television;Reading Psychosocial / Spiritual Social interaction - Mood/Behavior: Cooperative Recreational Therapy Orientation Orientation -Reviewed with patient: Available activity resources Strengths/Weaknesses Patient Strengths/Abilities: Willingness to participate Patient weaknesses: Physical limitations TR Patient demonstrates impairments in the following area(s): Endurance;Motor;Safety  Plan Rec Therapy Plan Is patient appropriate for Therapeutic Recreation?: Yes Rehab Potential: Good Treatment times per week: Min 1 time >20 minutes Estimated Length of Stay: 2 weeks TR Treatment/Interventions: Adaptive equipment instruction;1:1 session;Balance/vestibular training;Functional mobility training;Community reintegration;Patient/family education;Therapeutic activities;Recreation/leisure participation;UE/LE Coordination activities  Recommendations for other services: None  Discharge Criteria: Patient will be discharged from TR if patient refuses treatment 3 consecutive times without medical reason.  If treatment goals not met, if there is a change in medical status, if patient makes no progress towards goals or if patient is discharged from hospital.  The above assessment, treatment plan, treatment alternatives and goals were discussed and mutually agreed upon: by  patient  [place] 01/22/2014, 11:33 AM

## 2014-01-22 NOTE — Progress Notes (Signed)
Occupational Therapy Session Notes  Patient Details  Name: Maurice Stone MRN: [id number] Date of Birth: [birth year]  Today's Date: 01/22/2014 Time: 1000-1100 and 230-300 Time Calculation (min): 60 min and 30 min  Short Term Goals: Week 1:  OT Short Term Goal 1 (Week 1): Patient will be supervision for bathing OT Short Term Goal 2 (Week 1): Patient will be min a for dressing OT Short Term Goal 3 (Week 1): Patient will be min a for toilet transfers OT Short Term Goal 4 (Week 1): Patient will be min a for shower transfers OT Short Term Goal 5 (Week 1): Patient will be supervision for toileting  Skilled Therapeutic Interventions/Progress Updates:  1)  Patient resting in w/c upon arrival.  Engaged in self care retraining to include shower and dress.  Focused session on RUE forced use, gross and fine motor coordination, sustained muscle activity in RUE and RLE, static and dynamic balance, problem solving, memory and awareness.  While standing in shower to wash his bottom, patient attempt to stand on one leg to wash foot and required max assist and verbal reminder to sit down to wash his feet.  Patient is very eager to gain independence and motivated to work in therapy.  2)  Patient sleeping in bed upon arrival.  Patient requested to shave and brush teeth at sink and asked if he could try to ambulate "by myself" to the sink.  Patient unsteady ambulating and required minimal steady assist until approached sink and his right knee buckled and he required max assist to recover.  Focused session on dynamic balance in stand and squat with weight shifted over base of support secondary to patient prefers to over use his LLE during standing. Patient sat to attempt to shave however right hand unable to safely complete task. Patient demonstrated [facility] hand strength however he looses grip occasionally and has difficulty with gross and fine motor coordination.  Therapy Documentation Precautions:   Precautions Precautions: Fall Precaution Comments: R sided hemiparesis and R knee buckling Restrictions Weight Bearing Restrictions: No Other Position/Activity Restrictions: fall risk Pain: 1)  Reports tailbone pain, not rated, rest and repositioned, premedicated.  Mid back muscle pain, RN aware and muscle rub ointment requested. 2) no report of pain initially, premedicated. After standing at sink to brush teeth, patient reports mid-lower back pain.  Rest and repositioned.   ADL: See FIM for current functional status  Therapy/Group: Individual Therapy both sessions  [place], [place] 01/22/2014, 4:50 PM

## 2014-01-23 ENCOUNTER — Encounter (HOSPITAL_COMMUNITY): Payer: Medicare Other

## 2014-01-23 ENCOUNTER — Inpatient Hospital Stay (HOSPITAL_COMMUNITY): Payer: Medicare Other

## 2014-01-23 ENCOUNTER — Inpatient Hospital Stay (HOSPITAL_COMMUNITY): Payer: Medicare Other | Admitting: *Deleted

## 2014-01-23 ENCOUNTER — Inpatient Hospital Stay (HOSPITAL_COMMUNITY): Payer: Medicare Other | Admitting: Speech Pathology

## 2014-01-23 DIAGNOSIS — I6340 Cerebral infarction due to embolism of unspecified cerebral artery: Secondary | ICD-10-CM

## 2014-01-23 LAB — GLUCOSE, CAPILLARY
GLUCOSE-CAPILLARY: 245 mg/dL — AB (ref 70–99)
GLUCOSE-CAPILLARY: 129 mg/dL — AB (ref 70–99)
GLUCOSE-CAPILLARY: 128 mg/dL — AB (ref 70–99)
Glucose-Capillary: 133 mg/dL — ABNORMAL HIGH (ref 70–99)

## 2014-01-23 MED ORDER — SENNOSIDES-DOCUSATE SODIUM 8.6-50 MG PO TABS
2.0000 | ORAL_TABLET | Freq: Two times a day (BID) | ORAL | Status: DC
  Administered 2014-01-23 – 2014-02-02 (×21): 2 via ORAL
  Filled 2014-01-23 (×21): qty 2
  Filled 2014-01-23: qty 1
  Filled 2014-01-23 (×2): qty 2

## 2014-01-23 NOTE — Progress Notes (Signed)
Occupational Therapy Session Note  Patient Details  Name: Maurice Stone MRN: [id number] Date of Birth: [birth year]  Today's Date: 01/23/2014 Time: 1100-1200 Time Calculation (min): 60 min  Short Term Goals: Week 1:  OT Short Term Goal 1 (Week 1): Patient will be supervision for bathing OT Short Term Goal 2 (Week 1): Patient will be min a for dressing OT Short Term Goal 3 (Week 1): Patient will be min a for toilet transfers OT Short Term Goal 4 (Week 1): Patient will be min a for shower transfers OT Short Term Goal 5 (Week 1): Patient will be supervision for toileting  Skilled Therapeutic Interventions/Progress Updates:    Pt engaged in BADL retraining including bathing at shower level and dressing with sit<>stand from w/c.  Pt exhibited increased use of RUE throughout session for bathing and dressing tasks.  Pt is very deliberate with use of RUE and frequently requires numerous attempts with task to complete accurately.  Pt performed transfers and standing in shower and during dressing with close supervision.  Pt brushed teeth while standing at sink.  Focus on activity tolerance, transfers, standing balance, RUE use, and safety awareness.  Therapy Documentation Precautions:  Precautions Precautions: Fall Precaution Comments: R sided hemiparesis and R knee buckling Restrictions Weight Bearing Restrictions: No Other Position/Activity Restrictions: fall risk Pain: Pain Assessment Pain Assessment: No/denies pain Pain Intervention(s): RN made aware;Other (Comment) (discussed with NT for pt to avoid eating in bed with HOB raised)   Other Treatments:  See FIM for current functional status  Therapy/Group: Individual Therapy  Leroy Libman 01/23/2014, 12:07 PM

## 2014-01-23 NOTE — Progress Notes (Signed)
NUTRITION FOLLOW UP  Intervention:   - Continue Glucerna shake daily  - Recommend nursing get updated weight - Encouraged continued excellent intake - RD to continue to monitor   Nutrition Dx:   Unintentional wt loss related to inadequate oral intake as evidenced by 17 lbs unintentional wt loss - ongoing    Goal:   Pt to meet >/= 90% of their estimated nutrition needs - met   Monitor:   Weights, labs, intake  Assessment:   Maurice Stone is a 73 y.o. right handed male with history of hypertension, diabetes mellitus, CAD,COPD as well as CVA 6 years ago with some residual slurred speech maintained on aspirin. Patient lives alone independent prior to admission. Presented 01/15/2014 with frequent falls and right-sided weakness and slurred speech   6/27: -Pt endorsed a 17 lbs unintentional wt loss over past one year (7.7% body weight loss, non-severe for time frame)  -Diet recall indicates pt consuming eggs/toast/coffee for breakfast, skipping lunch, and canned soups for dinner  -Denied nausea/vomiting/abd pain that decreased appetite, pt noted he has minimal interest in cooking lunch meals  -Encouraged pt to implement a Glucerna/Boost Carb Control once daily regimen as pt skipping meals/nutrients has likely contributed to unintentional wt loss. Also noted importance of nutrition for rehab/recovery  -Was willing to begin supplement during admit and comply once stable to d/c  -Denied dysphagia or need for diet texture modifications. Current PO intake excellent at 100%   7/1: -Met with pt who reports eating 100% of meals and drinking 100% of Glucerna shakes -No nutritional concerns at this time   Height: Ht Readings from Last 1 Encounters:  01/17/14 '5\' 10"'  (1.778 m)    Weight Status:   Wt Readings from Last 1 Encounters:  01/18/14 204 lb 12.9 oz (92.9 kg)    Re-estimated needs:  Kcal: 1800-2000  Protein: 90-100 gram  Fluid: >/=1800 ml/daily   Skin: Intact  Diet  Order: Carb Control   Intake/Output Summary (Last 24 hours) at 01/23/14 1526 Last data filed at 01/23/14 1300  Gross per 24 hour  Intake    720 ml  Output      0 ml  Net    720 ml    Last BM: 6/29   Labs:   Recent Labs Lab 01/18/14 2017 01/21/14 0500  NA  --  139  K  --  4.6  CL  --  102  CO2  --  24  BUN  --  15  CREATININE 1.11 1.01  CALCIUM  --  9.3  GLUCOSE  --  129*    CBG (last 3)   Recent Labs  01/22/14 2052 01/23/14 0724 01/23/14 1152  GLUCAP 198* 133* 245*    Scheduled Meds: . amLODipine  10 mg Oral Daily  . antiseptic oral rinse  15 mL Mouth Rinse BID  . atorvastatin  20 mg Oral q1800  . buPROPion  100 mg Oral BID  . clopidogrel  75 mg Oral Daily  . donepezil  10 mg Oral QHS  . feeding supplement (GLUCERNA SHAKE)  237 mL Oral Q24H  . fenofibrate  160 mg Oral Daily  . gabapentin  600 mg Oral TID  . heparin  5,000 Units Subcutaneous 3 times per day  . insulin aspart  0-15 Units Subcutaneous TID WC  . insulin aspart  15 Units Subcutaneous TID WC  . insulin glargine  45 Units Subcutaneous BID  . loratadine  10 mg Oral Daily  . losartan  100  mg Oral Daily  . metoprolol  25 mg Oral BID  . pantoprazole  40 mg Oral Daily  . QUEtiapine  200 mg Oral QHS  . senna-docusate  2 tablet Oral BID  . sertraline  200 mg Oral Daily  . sulindac  150 mg Oral BID WC  . tamsulosin  0.4 mg Oral Daily    Carlis Stable MS, RD, [place] [phone] Pager [phone] Weekend/After Hours Pager

## 2014-01-23 NOTE — Progress Notes (Signed)
Speech Language Pathology Daily Session Note  Patient Details  Name: Maurice Stone MRN: [id number] Date of Birth: 11-27-1940  Today's Date: 01/23/2014 Time: 6712-4580 Time Calculation (min): 46 min  Short Term Goals: Week 1: SLP Short Term Goal 1 (Week 1): Patient will perform respiratory support/control/coordination exercises with 85% accuracy with moderate cues. SLP Short Term Goal 2 (Week 1): Patient will utilize learned speech strategies to increase speech intelligiblity and quality to 90% at sentence level. SLP Short Term Goal 3 (Week 1): Patient will recall and demonstrate understanding of learned strategies/safety precautions/exercises by verbalizing and return-demonstrating with 80% accuracy.  Skilled Therapeutic Interventions: Skilled treatment session focused on addressing dysarthria goals and education regarding carryover tasks for between sessions.  Patient Mod I for recall of speech production strategies;  patient required Supervision cues to self-monitor errors during unstructured speech tasks at the conversational level.  SLP facilitated task with instructions for patient to tally each errors that occurred throughout session and he required cues to utilize in the moment, but following awareness he was Mod I for correction with over articulation to fix imprecise consonant productions.  SLP provided patient with a list of tongue twisters to practice.  Recommend change in frequency to 3x/week due to progress.     FIM:  Comprehension Comprehension Mode: Auditory Comprehension: 5-Understands complex 90% of the time/Cues < 10% of the time Expression Expression Mode: Verbal Expression: 5-Expresses complex 90% of the time/cues < 10% of the time Social Interaction Social Interaction: 6-Interacts appropriately with others with medication or extra time (anti-anxiety, antidepressant). Problem Solving Problem Solving: 5-Solves complex 90% of the time/cues < 10% of the  time Memory Memory: 5-Recognizes or recalls 90% of the time/requires cueing < 10% of the time  Pain Pain Assessment Pain Assessment: No/denies pain  Therapy/Group: Individual Therapy  Carmelia Roller., [place] [phone]  [place] 01/23/2014, 10:22 AM

## 2014-01-23 NOTE — Progress Notes (Signed)
73 y.o. right handed male with history of hypertension, diabetes mellitus, CAD,COPD as well as CVA 6 years ago with some residual slurred speech maintained on aspirin. Patient lives alone independent prior to admission. Presented 01/15/2014 with frequent falls and right-sided weakness and slurred speech. MRI shows acute nonhemorrhagic left paramedian pontine infarct as well as remote infarct left basal ganglia. MRA of the head without stenosis or occlusion. Echocardiogram with ejection fraction 84% grade 1 diastolic dysfunction. Carotid Dopplers no ICA stenosis. Patient did not receive TPA. Neurology services consulted aspirin increased to 325 mg daily   Subjective/Complaints: Pain relief improved on increase vicodin constipation Review of Systems - Negative except tailbone pain  Objective: Vital Signs: Blood pressure 98/60, pulse 56, temperature 97.5 F (36.4 C), temperature source Oral, resp. rate 19, weight 92.9 kg (204 lb 12.9 oz), SpO2 92.00%. Dg Sacrum/coccyx  01/21/2014   CLINICAL DATA:  Sacral pain after fall.  EXAM: SACRUM AND COCCYX - 2+ VIEW  COMPARISON:  None.  FINDINGS: Sacroiliac joints appear normal. Mildly displaced fracture is seen involving the distal portion of the sacrum just above the coccyx.  IMPRESSION: Mildly displaced fracture involving the distal portion of the sacrum.   Electronically Signed   By: Sabino Dick M.D.   On: 01/21/2014 13:30   Results for orders placed during the hospital encounter of 01/18/14 (from the past 72 hour(s))  GLUCOSE, CAPILLARY     Status: Abnormal   Collection Time    01/20/14  7:36 AM      Result Value Ref Range   Glucose-Capillary 139 (*) 70 - 99 mg/dL  GLUCOSE, CAPILLARY     Status: Abnormal   Collection Time    01/20/14 11:39 AM      Result Value Ref Range   Glucose-Capillary 148 (*) 70 - 99 mg/dL  GLUCOSE, CAPILLARY     Status: Abnormal   Collection Time    01/20/14  4:32 PM      Result Value Ref Range   Glucose-Capillary 211 (*)  70 - 99 mg/dL  GLUCOSE, CAPILLARY     Status: Abnormal   Collection Time    01/20/14  9:07 PM      Result Value Ref Range   Glucose-Capillary 189 (*) 70 - 99 mg/dL  CBC WITH DIFFERENTIAL     Status: Abnormal   Collection Time    01/21/14  5:00 AM      Result Value Ref Range   WBC 8.3  4.0 - 10.5 K/uL   RBC 4.55  4.22 - 5.81 MIL/uL   Hemoglobin 13.8  13.0 - 17.0 g/dL   HCT 41.9  39.0 - 52.0 %   MCV 92.1  78.0 - 100.0 fL   MCH 30.3  26.0 - 34.0 pg   MCHC 32.9  30.0 - 36.0 g/dL   RDW 14.5  11.5 - 15.5 %   Platelets 125 (*) 150 - 400 K/uL   Neutrophils Relative % 50  43 - 77 %   Neutro Abs 4.1  1.7 - 7.7 K/uL   Lymphocytes Relative 37  12 - 46 %   Lymphs Abs 3.0  0.7 - 4.0 K/uL   Monocytes Relative 11  3 - 12 %   Monocytes Absolute 0.9  0.1 - 1.0 K/uL   Eosinophils Relative 2  0 - 5 %   Eosinophils Absolute 0.2  0.0 - 0.7 K/uL   Basophils Relative 0  0 - 1 %   Basophils Absolute 0.0  0.0 - 0.1 K/uL  COMPREHENSIVE METABOLIC PANEL     Status: Abnormal   Collection Time    01/21/14  5:00 AM      Result Value Ref Range   Sodium 139  137 - 147 mEq/L   Potassium 4.6  3.7 - 5.3 mEq/L   Chloride 102  96 - 112 mEq/L   CO2 24  19 - 32 mEq/L   Glucose, Bld 129 (*) 70 - 99 mg/dL   BUN 15  6 - 23 mg/dL   Creatinine, Ser 1.01  0.50 - 1.35 mg/dL   Calcium 9.3  8.4 - 10.5 mg/dL   Total Protein 6.8  6.0 - 8.3 g/dL   Albumin 3.3 (*) 3.5 - 5.2 g/dL   AST 25  0 - 37 U/L   ALT 26  0 - 53 U/L   Alkaline Phosphatase 83  39 - 117 U/L   Total Bilirubin 0.4  0.3 - 1.2 mg/dL   GFR calc non Af Amer 73 (*) >90 mL/min   GFR calc Af Amer 84 (*) >90 mL/min   Comment: (NOTE)     The eGFR has been calculated using the CKD EPI equation.     This calculation has not been validated in all clinical situations.     eGFR's persistently <90 mL/min signify possible Chronic Kidney     Disease.  GLUCOSE, CAPILLARY     Status: Abnormal   Collection Time    01/21/14  7:23 AM      Result Value Ref Range    Glucose-Capillary 122 (*) 70 - 99 mg/dL   Comment 1 Notify RN    GLUCOSE, CAPILLARY     Status: Abnormal   Collection Time    01/21/14 11:18 AM      Result Value Ref Range   Glucose-Capillary 117 (*) 70 - 99 mg/dL   Comment 1 Notify RN    GLUCOSE, CAPILLARY     Status: Abnormal   Collection Time    01/21/14  4:14 PM      Result Value Ref Range   Glucose-Capillary 164 (*) 70 - 99 mg/dL  GLUCOSE, CAPILLARY     Status: Abnormal   Collection Time    01/21/14  8:36 PM      Result Value Ref Range   Glucose-Capillary 188 (*) 70 - 99 mg/dL  GLUCOSE, CAPILLARY     Status: Abnormal   Collection Time    01/22/14  7:31 AM      Result Value Ref Range   Glucose-Capillary 166 (*) 70 - 99 mg/dL   Comment 1 Notify RN    GLUCOSE, CAPILLARY     Status: Abnormal   Collection Time    01/22/14 11:56 AM      Result Value Ref Range   Glucose-Capillary 152 (*) 70 - 99 mg/dL   Comment 1 Notify RN    GLUCOSE, CAPILLARY     Status: Abnormal   Collection Time    01/22/14  4:40 PM      Result Value Ref Range   Glucose-Capillary 167 (*) 70 - 99 mg/dL  GLUCOSE, CAPILLARY     Status: Abnormal   Collection Time    01/22/14  8:52 PM      Result Value Ref Range   Glucose-Capillary 198 (*) 70 - 99 mg/dL     HEENT: normal Cardio: RRR Resp: CTA B/L GI: BS positive and NT, ND Extremity:  No Edema Skin:   Intact Neuro: Alert/Oriented, Cranial Nerve II-XII normal, Normal Sensory, Normal Motor  and Abnormal FMC Ataxic/ dec FMC Musc/Skel:  Other sacral and coccygeal tenderness Gen NAD   Assessment/Plan: 1. Functional deficits secondary to Left pontine infarct which require 3+ hours per day of interdisciplinary therapy in a comprehensive inpatient rehab setting. Physiatrist is providing close team supervision and 24 hour management of active medical problems listed below. Physiatrist and rehab team continue to assess barriers to discharge/monitor patient progress toward functional and medical  goals. FIM: FIM - Bathing Bathing Steps Patient Completed: Chest;Right Arm;Left Arm;Abdomen;Front perineal area;Right upper leg;Left upper leg;Right lower leg (including foot);Left lower leg (including foot);Buttocks Bathing: 4: Steadying assist  FIM - Upper Body Dressing/Undressing Upper body dressing/undressing steps patient completed: Thread/unthread right sleeve of pullover shirt/dresss;Thread/unthread left sleeve of pullover shirt/dress;Put head through opening of pull over shirt/dress;Pull shirt over trunk Upper body dressing/undressing: 5: Set-up assist to: Obtain clothing/put away FIM - Lower Body Dressing/Undressing Lower body dressing/undressing steps patient completed: Thread/unthread right underwear leg;Thread/unthread left underwear leg;Pull underwear up/down;Thread/unthread right pants leg;Thread/unthread left pants leg;Pull pants up/down;Fasten/unfasten pants;Don/Doff right sock;Don/Doff left sock Lower body dressing/undressing: 4: Min-Patient completed 75 plus % of tasks  FIM - Toileting Toileting steps completed by patient: Performs perineal hygiene Toileting Assistive Devices: Grab bar or rail for support Toileting: 2: Max-Patient completed 1 of 3 steps  FIM - Radio producer Devices: Grab bars Toilet Transfers: 3-To toilet/BSC: Mod A (lift or lower assist);3-From toilet/BSC: Mod A (lift or lower assist)  FIM - Engineer, site Assistive Devices: Arm rests Bed/Chair Transfer: 3: Bed > Chair or W/C: Mod A (lift or lower assist);2: Chair or W/C > Bed: Max A (lift and lower assist)  FIM - Locomotion: Wheelchair Distance: 80 Locomotion: Wheelchair: 2: Travels 50 - 149 ft with supervision, cueing or coaxing FIM - Locomotion: Ambulation Ambulation/Gait Assistance: 1: +2 Total assist;3: Mod assist (+2 for w/c follow; modA for actual gait) Locomotion: Ambulation: 0: Activity did not occur  Comprehension Comprehension Mode:  Auditory Comprehension: 5-Follows basic conversation/direction: With no assist  Expression Expression Mode: Verbal Expression: 5-Expresses basic needs/ideas: With no assist  Social Interaction Social Interaction: 6-Interacts appropriately with others with medication or extra time (anti-anxiety, antidepressant).  Problem Solving Problem Solving: 5-Solves basic problems: With no assist  Memory Memory: 5-Recognizes or recalls 90% of the time/requires cueing < 10% of the time  Medical Problem List and Plan:  1. Functional deficits secondary to acute left paramedian pontine infarct  2. DVT Prophylaxis/Anticoagulation: Subcutaneous heparin. Monitor platelet counts and any signs of bleeding.  3. Pain Management: Clinoril 150 mg twice a day, Neurontin 600 mg 3 times a day, Hydrocodone with increase dose as needed. Monitor with increased mobility ,  4. Mood/memory loss/depression: Wellbutrin 100 mg twice a day. Aricept 10 mg each bedtime, Seroquel 200 mg each bedtime, Zoloft 200 mg daily.  5. Neuropsych: This patient is capable of making decisions on his own behalf.  6. Hypertension. Norvasc 10 mg daily, Cozaar 100 mg daily, Lopressor 50 mg twice a day.bradycardia reduce BB dose  7. Diabetes mellitus with peripheral neuropathy. Hemoglobin A1c 10.6. Lantus insulin 40 units twice a day, NovoLog 12 units 3 times a day. Check blood sugars a.c. and at bedtime. Provide diabetic teaching  8. Hyperlipidemia. Lipitor  9.  Sacral pain pt reports xrays at [facility], repeat confirms sacrococcygeal jct fx, analgesics and avoid direct pressure 10.  Constipation- senna LOS (Days) 5 A FACE TO FACE EVALUATION WAS PERFORMED  KIRSTEINS,ANDREW E 01/23/2014, 7:05 AM

## 2014-01-23 NOTE — Progress Notes (Signed)
Physical Therapy Session Note  Patient Details  Name: Maurice Stone MRN: [id number] Date of Birth: 1941/01/03  Today's Date: 01/23/2014 Time: 0800-0845,1510-1540 Time Calculation (min): 45 min, 30 min  Short Term Goals: Week 1:  PT Short Term Goal 1 (Week 1): Patient will perform functional transfers with supervision. PT Short Term Goal 2 (Week 1): Patient will perform gait training 150' with LRAD and minA. PT Short Term Goal 3 (Week 1): Patient will negotiate 5 steps with one handrail and minA for community access. PT Short Term Goal 4 (Week 1): Patient will demonstrate carry over will functional mobility techniques with min cues.  Skilled Therapeutic Interventions/Progress Updates:  tx 1:  Pt still eating breakfast; voiced pain in  sacrum due to bed positioning.  Bed mobility to sit EOB, supervision.  Educated with pictures, model regarding location of his fx and reasons to avoid certain positions.  Cues for anterior pelvic tilt, upright trunk, bil foot position during eating.  Pt donned pants starting in sitting, with mod cues for technique, finished in standing, requiring much extra time and 4 attempts to button tight pants. Pt required mod assist for balance when attempting to use bil hands to button; eventually therapist completed.  Pt stated he needed to use toilet to urinate.  Attempted urinating in standing, but unable; transferred to sitting, with min assist and rails.  Tx 2:  Toilet transfer w/c> stand with min guard assist.  Pt successful urinating in standing.  Sit> stand at sink for hand hygiene, supervision; max assist to prevent fall at sink to R due to LOB; pt very delayed in recognizing LOB.  Kinetron, in sitting and standing with UEs > without UEs, and  W/c propulsion using bil LEs for neuro re-ed.    Therapy Documentation Precautions:  Precautions Precautions: Fall Precaution Comments: R sided hemiparesis and R knee buckling Restrictions Weight Bearing  Restrictions: No Other Position/Activity Restrictions: fall risk   Vital Signs: Therapy Vitals Pulse Rate: 56 BP: 115/61 mmHg Pain: Pain Assessment Pain Assessment:  (unable to rate; sacrum hurts in bed) Pain Intervention(s): RN made aware;Other (Comment) (discussed with NT for pt to avoid eating in bed with HOB raised)     Other Treatments: Treatments Therapeutic Activity: eating sitting EOB focusing on anterior pelvic tilt; dynamic standing balance during clothing management Neuromuscular Facilitation: Activity to increase motor control;Activity to increase sustained activation;Activity to increase anterior-posterior weight shifting;Right  See FIM for current functional status  Therapy/Group: Individual Therapy  Lenny Bouchillon 01/23/2014, 11:02 AM

## 2014-01-24 ENCOUNTER — Inpatient Hospital Stay (HOSPITAL_COMMUNITY): Payer: Medicare Other | Admitting: Occupational Therapy

## 2014-01-24 ENCOUNTER — Inpatient Hospital Stay (HOSPITAL_COMMUNITY): Payer: Medicare Other | Admitting: *Deleted

## 2014-01-24 ENCOUNTER — Inpatient Hospital Stay (HOSPITAL_COMMUNITY): Payer: Medicare Other | Admitting: Rehabilitation

## 2014-01-24 DIAGNOSIS — I6340 Cerebral infarction due to embolism of unspecified cerebral artery: Secondary | ICD-10-CM

## 2014-01-24 LAB — GLUCOSE, CAPILLARY
GLUCOSE-CAPILLARY: 78 mg/dL (ref 70–99)
Glucose-Capillary: 180 mg/dL — ABNORMAL HIGH (ref 70–99)
Glucose-Capillary: 187 mg/dL — ABNORMAL HIGH (ref 70–99)
Glucose-Capillary: 184 mg/dL — ABNORMAL HIGH (ref 70–99)

## 2014-01-24 NOTE — Progress Notes (Signed)
73 y.o. right handed male with history of hypertension, diabetes mellitus, CAD,COPD as well as CVA 6 years ago with some residual slurred speech maintained on aspirin. Patient lives alone independent prior to admission. Presented 01/15/2014 with frequent falls and right-sided weakness and slurred speech. MRI shows acute nonhemorrhagic left paramedian pontine infarct as well as remote infarct left basal ganglia. MRA of the head without stenosis or occlusion. Echocardiogram with ejection fraction 98% grade 1 diastolic dysfunction. Carotid Dopplers no ICA stenosis. Patient did not receive TPA. Neurology services consulted aspirin increased to 325 mg daily   Subjective/Complaints: Pain relief improved on increase vicodin, states he is taking meds  Review of Systems - Negative except tailbone pain  Objective: Vital Signs: Blood pressure 137/74, pulse 78, temperature 97.4 F (36.3 C), temperature source Oral, resp. rate 19, weight 95.2 kg (209 lb 14.1 oz), SpO2 99.00%. No results found. Results for orders placed during the hospital encounter of 01/18/14 (from the past 72 hour(s))  GLUCOSE, CAPILLARY     Status: Abnormal   Collection Time    01/21/14 11:18 AM      Result Value Ref Range   Glucose-Capillary 117 (*) 70 - 99 mg/dL   Comment 1 Notify RN    GLUCOSE, CAPILLARY     Status: Abnormal   Collection Time    01/21/14  4:14 PM      Result Value Ref Range   Glucose-Capillary 164 (*) 70 - 99 mg/dL  GLUCOSE, CAPILLARY     Status: Abnormal   Collection Time    01/21/14  8:36 PM      Result Value Ref Range   Glucose-Capillary 188 (*) 70 - 99 mg/dL  GLUCOSE, CAPILLARY     Status: Abnormal   Collection Time    01/22/14  7:31 AM      Result Value Ref Range   Glucose-Capillary 166 (*) 70 - 99 mg/dL   Comment 1 Notify RN    GLUCOSE, CAPILLARY     Status: Abnormal   Collection Time    01/22/14 11:56 AM      Result Value Ref Range   Glucose-Capillary 152 (*) 70 - 99 mg/dL   Comment 1 Notify  RN    GLUCOSE, CAPILLARY     Status: Abnormal   Collection Time    01/22/14  4:40 PM      Result Value Ref Range   Glucose-Capillary 167 (*) 70 - 99 mg/dL  GLUCOSE, CAPILLARY     Status: Abnormal   Collection Time    01/22/14  8:52 PM      Result Value Ref Range   Glucose-Capillary 198 (*) 70 - 99 mg/dL  GLUCOSE, CAPILLARY     Status: Abnormal   Collection Time    01/23/14  7:24 AM      Result Value Ref Range   Glucose-Capillary 133 (*) 70 - 99 mg/dL  GLUCOSE, CAPILLARY     Status: Abnormal   Collection Time    01/23/14 11:52 AM      Result Value Ref Range   Glucose-Capillary 245 (*) 70 - 99 mg/dL   Comment 1 Notify RN    GLUCOSE, CAPILLARY     Status: Abnormal   Collection Time    01/23/14  4:41 PM      Result Value Ref Range   Glucose-Capillary 129 (*) 70 - 99 mg/dL   Comment 1 Notify RN    GLUCOSE, CAPILLARY     Status: Abnormal   Collection Time  01/23/14  9:34 PM      Result Value Ref Range   Glucose-Capillary 128 (*) 70 - 99 mg/dL  GLUCOSE, CAPILLARY     Status: None   Collection Time    01/24/14  7:11 AM      Result Value Ref Range   Glucose-Capillary 78  70 - 99 mg/dL   Comment 1 Notify RN       HEENT: normal Cardio: RRR Resp: CTA B/L GI: BS positive and NT, ND Extremity:  No Edema Skin:   Intact Neuro: Alert/Oriented, Cranial Nerve II-XII normal, Normal Sensory, Normal Motor and Abnormal FMC Ataxic/ dec FMC Musc/Skel:  Other sacral and coccygeal tenderness Gen NAD Mood/affect delayed responses but otherwise approp  Assessment/Plan: 1. Functional deficits secondary to Left pontine infarct which require 3+ hours per day of interdisciplinary therapy in a comprehensive inpatient rehab setting. Physiatrist is providing close team supervision and 24 hour management of active medical problems listed below. Physiatrist and rehab team continue to assess barriers to discharge/monitor patient progress toward functional and medical goals. FIM: FIM -  Bathing Bathing Steps Patient Completed: Chest;Right Arm;Left Arm;Abdomen;Front perineal area;Right upper leg;Left upper leg;Right lower leg (including foot);Left lower leg (including foot);Buttocks Bathing: 5: Supervision: Safety issues/verbal cues  FIM - Upper Body Dressing/Undressing Upper body dressing/undressing steps patient completed: Thread/unthread right sleeve of pullover shirt/dresss;Thread/unthread left sleeve of pullover shirt/dress;Put head through opening of pull over shirt/dress;Pull shirt over trunk Upper body dressing/undressing: 5: Set-up assist to: Obtain clothing/put away FIM - Lower Body Dressing/Undressing Lower body dressing/undressing steps patient completed: Thread/unthread right underwear leg;Thread/unthread left underwear leg;Pull underwear up/down;Thread/unthread right pants leg;Thread/unthread left pants leg;Pull pants up/down;Fasten/unfasten pants;Don/Doff right sock;Don/Doff left sock Lower body dressing/undressing: 4: Min-Patient completed 75 plus % of tasks  FIM - Toileting Toileting steps completed by patient: Performs perineal hygiene Toileting Assistive Devices: Grab bar or rail for support Toileting: 2: Max-Patient completed 1 of 3 steps  FIM - Radio producer Devices: Grab bars Toilet Transfers: 4-From toilet/BSC: Min A (steadying Pt. > 75%);4-To toilet/BSC: Min A (steadying Pt. > 75%)  FIM - Bed/Chair Transfer Bed/Chair Transfer Assistive Devices: Arm rests Bed/Chair Transfer: 3: Bed > Chair or W/C: Mod A (lift or lower assist);5: Supine > Sit: Supervision (verbal cues/safety issues)  FIM - Locomotion: Wheelchair Distance: 80 Locomotion: Wheelchair: 1: Total Assistance/staff pushes wheelchair (Pt<25%) FIM - Locomotion: Ambulation Ambulation/Gait Assistance: 1: +2 Total assist;3: Mod assist (+2 for w/c follow; modA for actual gait) Locomotion: Ambulation: 0: Activity did not occur  Comprehension Comprehension Mode:  Auditory Comprehension: 5-Follows basic conversation/direction: With extra time/assistive device  Expression Expression Mode: Verbal Expression: 5-Expresses basic needs/ideas: With extra time/assistive device  Social Interaction Social Interaction: 6-Interacts appropriately with others with medication or extra time (anti-anxiety, antidepressant).  Problem Solving Problem Solving: 5-Solves basic 90% of the time/requires cueing < 10% of the time  Memory Memory: 5-Recognizes or recalls 90% of the time/requires cueing < 10% of the time  Medical Problem List and Plan:  1. Functional deficits secondary to acute left paramedian pontine infarct  2. DVT Prophylaxis/Anticoagulation: Subcutaneous heparin. Monitor platelet counts and any signs of bleeding.  3. Pain Management: Clinoril 150 mg twice a day, Neurontin 600 mg 3 times a day, Hydrocodone with increase dose as needed. Monitor with increased mobility ,  4. Mood/memory loss/depression: Wellbutrin 100 mg twice a day. Aricept 10 mg each bedtime, Seroquel 200 mg each bedtime, Zoloft 200 mg daily.  5. Neuropsych: This patient is capable of making  decisions on his own behalf.  6. Hypertension. Norvasc 10 mg daily, Cozaar 100 mg daily, Lopressor 50 mg twice a day.bradycardia reduce BB dose  7. Diabetes mellitus with peripheral neuropathy. Hemoglobin A1c 10.6. Lantus insulin 40 units twice a day, NovoLog 12 units 3 times a day. Check blood sugars a.c. and at bedtime. Provide diabetic teaching  8. Hyperlipidemia. Lipitor  9.  Sacral pain pt reports xrays at [facility], repeat confirms sacrococcygeal jct fx, analgesics and avoid direct pressure 10.  Constipation- senna LOS (Days) 6 A FACE TO FACE EVALUATION WAS PERFORMED  KIRSTEINS,ANDREW E 01/24/2014, 7:33 AM

## 2014-01-24 NOTE — Progress Notes (Signed)
Social Work [name], LCSW Social Worker Signed  Patient Care Conference Service date: 01/24/2014 9:46 AM  Inpatient RehabilitationTeam Conference and Plan of Care Update Date: 01/23/2014   Time: 10:30 AM     Patient Name: Maurice Stone       Medical Record Number: [id number]   Date of Birth: 02-23-1941 Sex: Male         Room/Bed: 4M09C/4M09C-01 Payor Info: Payor: MEDICARE / Plan: MEDICARE PART A / Product Type: *No Product type* /   Admitting Diagnosis: L pons CVA   Admit Date/Time:  01/18/2014  3:02 PM Admission Comments: No comment available   Primary Diagnosis:  <principal problem not specified> Principal Problem: <principal problem not specified>    Patient Active Problem List     Diagnosis  Date Noted   .  CVA (cerebral infarction)  01/18/2014   .  Acute CVA (cerebrovascular accident)  01/17/2014   .  CVA (cerebral vascular accident)  01/15/2014   .  Essential hypertension, benign  01/15/2014   .  Bradycardia  01/15/2014   .  chronic Right sided weakness  01/15/2014   .  GERD (gastroesophageal reflux disease)  01/15/2014   .  Neuropathy  01/15/2014     Expected Discharge Date: Expected Discharge Date: 02/02/14  Team Members Present: Physician leading conference: Dr. Alysia Penna Social Worker Present: Ovidio Kin, LCSW Nurse Present: Dorien Chihuahua, RN PT Present: Georjean Mode, PT;Blair Hobble, PT OT Present: Antony Salmon, OT SLP Present: Other (comment) Elmyra Ricks page-SP) PPS Coordinator present : Ileana Ladd, Lelan Pons, RN, CRRN        Current Status/Progress  Goal  Weekly Team Focus   Medical     coccyx pain  improve pain to allow PT participation  adjust meds, improve pt compliance   Bowel/Bladder     Patient is continent of bowel and bladder. LBM: 01/21/2014  Patient to remain continent of B&B  Monitor for constipation/diarrhea   Swallow/Nutrition/ Hydration     regular and thin   n/a  n/a   ADL's     Overall min-mod  Overall  Supervision/cueing with BADLs  activity tolerance, static and dynamic balance, sustained muscle activity, gross and fine motor coordination, intellectual awareness, memory and discharge planning.   Mobility     mod assist transfer, supervision w/c x 150', pre-gait  supervision overall including 150' gait and up/down 5 steps  neuro re-ed, balance, functional mobility and locomotion   Communication     Supervision   Mod I  change in frequency to 3x/week self-monitoring and correcting of errors   Safety/Cognition/ Behavioral Observations    baseline Supervision with complex  n/a     Pain     Vicodin 2 tabs prn given at [phone] for coccyx pain rating 7  Pain managed at or below 3  Assess and monitor pain and provide appropriate intervention   Skin     Skin intact  No skin breakdown  Assess skin q shift     *See Care Plan and progress notes for long and short-term goals.    Barriers to Discharge:  see above      Possible Resolutions to Barriers:    see above      Discharge Planning/Teaching Needs:    Return home with intermittent assist from daughter and son-was falling PTA      Team Discussion:    Right knee buckles due to arthritis, pain issues with tailbone fracture.  Was falling PTA.  Goals supervision level  for cues and safety   Revisions to Treatment Plan:    None    Continued Need for Acute Rehabilitation Level of Care: The patient requires daily medical management by a physician with specialized training in physical medicine and rehabilitation for the following conditions: Daily direction of a multidisciplinary physical rehabilitation program to ensure safe treatment while eliciting the highest outcome that is of practical value to the patient.: Yes Daily medical management of patient stability for increased activity during participation in an intensive rehabilitation regime.: Yes Daily analysis of laboratory values and/or radiology reports with any subsequent need for medication  adjustment of medical intervention for : Neurological problems;Other  [name] 01/24/2014, 9:46 AM          Patient ID: Maurice Stone, male   DOB: February 14, 1941, 73 y.o.   MRN: [id number]

## 2014-01-24 NOTE — Patient Care Conference (Signed)
Inpatient RehabilitationTeam Conference and Plan of Care Update Date: 01/23/2014   Time: 10:30 AM    Patient Name: Maurice Stone      Medical Record Number: [id number]  Date of Birth: Dec 04, 1940 Sex: Male         Room/Bed: 4M09C/4M09C-01 Payor Info: Payor: MEDICARE / Plan: MEDICARE PART A / Product Type: *No Product type* /    Admitting Diagnosis: L pons CVA  Admit Date/Time:  01/18/2014  3:02 PM Admission Comments: No comment available   Primary Diagnosis:  <principal problem not specified> Principal Problem: <principal problem not specified>  Patient Active Problem List   Diagnosis Date Noted  . CVA (cerebral infarction) 01/18/2014  . Acute CVA (cerebrovascular accident) 01/17/2014  . CVA (cerebral vascular accident) 01/15/2014  . Essential hypertension, benign 01/15/2014  . Bradycardia 01/15/2014  . chronic Right sided weakness 01/15/2014  . GERD (gastroesophageal reflux disease) 01/15/2014  . Neuropathy 01/15/2014    Expected Discharge Date: Expected Discharge Date: 02/02/14  Team Members Present: Physician leading conference: Dr. Alysia Penna Social Worker Present: Ovidio Kin, LCSW Nurse Present: Dorien Chihuahua, RN PT Present: Georjean Mode, PT;Blair Hobble, PT OT Present: Antony Salmon, OT SLP Present: Other (comment) Elmyra Ricks page-SP) PPS Coordinator present : Ileana Ladd, Lelan Pons, RN, CRRN     Current Status/Progress Goal Weekly Team Focus  Medical   coccyx pain  improve pain to allow PT participation  adjust meds, improve pt compliance   Bowel/Bladder   Patient is continent of bowel and bladder. LBM: 01/21/2014  Patient to remain continent of B&B  Monitor for constipation/diarrhea   Swallow/Nutrition/ Hydration   regular and thin   n/a  n/a   ADL's   Overall min-mod  Overall Supervision/cueing with BADLs  activity tolerance, static and dynamic balance, sustained muscle activity, gross and fine motor coordination, intellectual awareness, memory  and discharge planning.   Mobility   mod assist transfer, supervision w/c x 150', pre-gait  supervision overall including 150' gait and up/down 5 steps  neuro re-ed, balance, functional mobility and locomotion   Communication   Supervision   Mod I  change in frequency to 3x/week self-monitoring and correcting of errors   Safety/Cognition/ Behavioral Observations  baseline Supervision with complex  n/a      Pain   Vicodin 2 tabs prn given at [phone] for coccyx pain rating 7  Pain managed at or below 3  Assess and monitor pain and provide appropriate intervention   Skin   Skin intact  No skin breakdown  Assess skin q shift      *See Care Plan and progress notes for long and short-term goals.  Barriers to Discharge: see above    Possible Resolutions to Barriers:  see above    Discharge Planning/Teaching Needs:  Return home with intermittent assist from daughter and son-was falling PTA      Team Discussion:  Right knee buckles due to arthritis, pain issues with tailbone fracture.  Was falling PTA.  Goals supervision level for cues and safety  Revisions to Treatment Plan:  None   Continued Need for Acute Rehabilitation Level of Care: The patient requires daily medical management by a physician with specialized training in physical medicine and rehabilitation for the following conditions: Daily direction of a multidisciplinary physical rehabilitation program to ensure safe treatment while eliciting the highest outcome that is of practical value to the patient.: Yes Daily medical management of patient stability for increased activity during participation in an intensive rehabilitation regime.: Yes Daily analysis  of laboratory values and/or radiology reports with any subsequent need for medication adjustment of medical intervention for : Neurological problems;Other  [name] 01/24/2014, 9:46 AM

## 2014-01-24 NOTE — Progress Notes (Signed)
Occupational Therapy Session Notes  Patient Details  Name: Maurice Stone MRN: [id number] Date of Birth: 08/07/1940  Today's Date: 01/24/2014 Time: 4818-5631 and 132-217 Time Calculation (min): 44 min and 45 min  Short Term Goals: Week 1:  OT Short Term Goal 1 (Week 1): Patient will be supervision for bathing OT Short Term Goal 2 (Week 1): Patient will be min a for dressing OT Short Term Goal 3 (Week 1): Patient will be min a for toilet transfers OT Short Term Goal 4 (Week 1): Patient will be min a for shower transfers OT Short Term Goal 5 (Week 1): Patient will be supervision for toileting  Skilled Therapeutic Interventions/Progress Updates:  1)  Self care retraining to include shower and dress.  Focused session on activity tolerance, ambulating while gathering clothes, dynamic balance, forced use of RUE, problem solving, selective attention, and safety awareness.  During functional mobility with BADL, patient with LOB ~4 times requiring assist to recover.  Patient continues to automatically stand on one foot to perform some LB BADL tasks and requires min-mod cues to sit back down to perform then task.  2)  Patient sleeping in the bed upon arrival.  Engaged in dynamic balance while walking behind his w/c, heavy weight bearing through RLE & RUE in quadruped and in tall kneeling with focus on sustained muscle activity.  Patient walking too fast initially then when slowed down and focused on control he performed much better.  Patient with difficulty moving slowly with weight shifts in quadruped due to LOB and while in tall kneeling.  Patient propelled w/c partially back to his room then continued the rest of the way performing side steps while leading with his RLE as well as side steps while in squat position.  Therapy Documentation Precautions:  Precautions Precautions: Fall Precaution Comments: R sided hemiparesis and R knee buckling Restrictions Weight Bearing Restrictions: No Other  Position/Activity Restrictions: fall risk Pain: 1)  6/10 tailbone, premedicated, rest repositioned 2)  No c/o pain ADL: See FIM for current functional status  Therapy/Group: Individual Therapy both sessions  [place], [place] 01/24/2014, 9:58 AM

## 2014-01-24 NOTE — Progress Notes (Signed)
Physical Therapy Session Note  Patient Details  Name: Maurice Stone MRN: [id number] Date of Birth: 1941/07/02  Today's Date: 01/24/2014 Time: 9326-7124 Time Calculation (min): 61 min  Short Term Goals: Week 1:  PT Short Term Goal 1 (Week 1): Patient will perform functional transfers with supervision. PT Short Term Goal 2 (Week 1): Patient will perform gait training 150' with LRAD and minA. PT Short Term Goal 3 (Week 1): Patient will negotiate 5 steps with one handrail and minA for community access. PT Short Term Goal 4 (Week 1): Patient will demonstrate carry over will functional mobility techniques with min cues.  Skilled Therapeutic Interventions/Progress Updates:   Pt received sitting in w/c in room, agreeable to therapy session.  Skilled session focused on gait training with RW, stair training for community use and NMR to RLE with stepping to block, kinetron and kicking yoga block.  Performed gait x 100' x 1 with RW at min A (R knee wrapped to provide feedback to prevent buckle-did not buckle during session) with verbal cues and facilitation for upright posture, glute and quad activation on RLE during R stance and assist for RW, esp when turning.  Performed 3, 4" and 2, 6" steps with single handrail at min A level.  Mod cues for step to pattern with light tactile cues at R knee for increased control.  Seated and standing kinetron x 2 reps of 1 min (sitting) and 2 reps of 1 min (standing).  Does well seated with moderate resistance, however in standing, tends to demonstrate decreased glute activation, regardless of adequate knee control.  Ended session with yoga block kick during gait for increased balance and weight shifting.  Requires mod A to prevent LOB intermittently, however did well with cues for R LE.  Ambulated back to room pushing w/c at min/mod A.  Pt with increased speed as getting closer to room with increased stride length, causing RLE to hyperextend at times.  Max verbal cues for  slower gait speed, shorter steps and safety.  Pt left in w/c with all needs in reach.    Therapy Documentation Precautions:  Precautions Precautions: Fall Precaution Comments: R sided hemiparesis and R knee buckling Restrictions Weight Bearing Restrictions: No Other Position/Activity Restrictions: fall risk   Pain: Pain Assessment Pain Assessment: 0-10 Pain Score: 0-No pain   Locomotion : Ambulation Ambulation/Gait Assistance: 4: Min assist Wheelchair Mobility Distance: 80   See FIM for current functional status  Therapy/Group: Individual Therapy  Denice Bors 01/24/2014, 12:50 PM

## 2014-01-24 NOTE — Progress Notes (Signed)
Social Work Patient ID: Maurice Stone, male   DOB: Aug 28, 1940, 73 y.o.   MRN: [id number] Met with pt and spoke with both children Santiago Glad & [name] to discuss team conference goals supervision level and discharge 7/11. Daughter and son will discuss and decide what  Each of them can do and come up with a plan.  Santiago Glad states: " We will take care of it."  Pt feels he will be ok at home with intermittent assist like he was getting from them. Main issue is to keep him safe.  Will continue to work on discharge plans and await pt's progress.

## 2014-01-24 NOTE — Progress Notes (Signed)
Physical Therapy Session Note  Patient Details  Name: Maurice Stone MRN: [id number] Date of Birth: 05-20-1941  Today's Date: 01/24/2014 Time:  7:59-8:30 (37min)   Short Term Goals: Week 1:  PT Short Term Goal 1 (Week 1): Patient will perform functional transfers with supervision. PT Short Term Goal 2 (Week 1): Patient will perform gait training 150' with LRAD and minA. PT Short Term Goal 3 (Week 1): Patient will negotiate 5 steps with one handrail and minA for community access. PT Short Term Goal 4 (Week 1): Patient will demonstrate carry over will functional mobility techniques with min cues.  Skilled Therapeutic Interventions/Progress Updates:  Pt up EOB eating breakfast. Reviewed STG and LTG wit hpt as well as progress in therapy to this point. Pt reports feeling on track and ELOS is appropriate. Reporting 6/10 pain tailbone, premedicated, no intervention needed at this time.   Tx focused on transfers, gait at hand rail, and NMR via forced use, manual facilitation, and verbal cues for R-sided motor control.   Performed serial WC<>bed transfers for practice with cues for safe hand placement and technique to avoid pushing from back of legs. Pt with good learning within session. S/min A needed   Gait with R knee ace wrapped for propioception on hand rail 2x25' with min A and cues for R stance control at hip/knee as well as step.  HS stretch seated bil 2x88min each with cues for technique.  Pt left up in [facility] with all needs in reach.      Therapy Documentation Precautions:  Precautions Precautions: Fall Precaution Comments: R sided hemiparesis and R knee buckling Restrictions Weight Bearing Restrictions: No Other Position/Activity Restrictions: fall risk    Vital Signs: Therapy Vitals Temp: 97.4 F (36.3 C) Temp src: Oral Pulse Rate: 78 Resp: 19 BP: 137/74 mmHg Patient Position (if appropriate): Lying Oxygen Therapy SpO2: 99 % O2 Device: None (Room air)     See FIM  for current functional status  Therapy/Group: Individual Therapy Kennieth Rad, PT, DPT  01/24/2014, 8:10 AM

## 2014-01-25 ENCOUNTER — Inpatient Hospital Stay (HOSPITAL_COMMUNITY): Payer: Medicare Other | Admitting: Occupational Therapy

## 2014-01-25 ENCOUNTER — Inpatient Hospital Stay (HOSPITAL_COMMUNITY): Payer: Medicare Other | Admitting: *Deleted

## 2014-01-25 LAB — GLUCOSE, CAPILLARY
GLUCOSE-CAPILLARY: 94 mg/dL (ref 70–99)
GLUCOSE-CAPILLARY: 131 mg/dL — AB (ref 70–99)
GLUCOSE-CAPILLARY: 159 mg/dL — AB (ref 70–99)
Glucose-Capillary: 132 mg/dL — ABNORMAL HIGH (ref 70–99)

## 2014-01-25 MED ORDER — OXYCODONE HCL ER 10 MG PO T12A
10.0000 mg | EXTENDED_RELEASE_TABLET | Freq: Every day | ORAL | Status: AC
  Administered 2014-01-25 – 2014-01-31 (×7): 10 mg via ORAL
  Filled 2014-01-25 (×7): qty 1

## 2014-01-25 NOTE — Progress Notes (Addendum)
73 y.o. right handed male with history of hypertension, diabetes mellitus, CAD,COPD as well as CVA 6 years ago with some residual slurred speech maintained on aspirin. Patient lives alone independent prior to admission. Presented 01/15/2014 with frequent falls and right-sided weakness and slurred speech. MRI shows acute nonhemorrhagic left paramedian pontine infarct as well as remote infarct left basal ganglia. MRA of the head without stenosis or occlusion. Echocardiogram with ejection fraction 52% grade 1 diastolic dysfunction. Carotid Dopplers no ICA stenosis. Patient did not receive TPA. Neurology services consulted aspirin increased to 325 mg daily   Subjective/Complaints: Pain relief improved on increase vicodin, states he is taking meds, needs to wake up at noc to ask for pain med  Review of Systems - Negative except tailbone pain  Objective: Vital Signs: Blood pressure 117/65, pulse 65, temperature 97.7 F (36.5 C), temperature source Oral, resp. rate 19, weight 95.2 kg (209 lb 14.1 oz), SpO2 97.00%. No results found. Results for orders placed during the hospital encounter of 01/18/14 (from the past 72 hour(s))  GLUCOSE, CAPILLARY     Status: Abnormal   Collection Time    01/22/14 11:56 AM      Result Value Ref Range   Glucose-Capillary 152 (*) 70 - 99 mg/dL   Comment 1 Notify RN    GLUCOSE, CAPILLARY     Status: Abnormal   Collection Time    01/22/14  4:40 PM      Result Value Ref Range   Glucose-Capillary 167 (*) 70 - 99 mg/dL  GLUCOSE, CAPILLARY     Status: Abnormal   Collection Time    01/22/14  8:52 PM      Result Value Ref Range   Glucose-Capillary 198 (*) 70 - 99 mg/dL  GLUCOSE, CAPILLARY     Status: Abnormal   Collection Time    01/23/14  7:24 AM      Result Value Ref Range   Glucose-Capillary 133 (*) 70 - 99 mg/dL  GLUCOSE, CAPILLARY     Status: Abnormal   Collection Time    01/23/14 11:52 AM      Result Value Ref Range   Glucose-Capillary 245 (*) 70 - 99 mg/dL    Comment 1 Notify RN    GLUCOSE, CAPILLARY     Status: Abnormal   Collection Time    01/23/14  4:41 PM      Result Value Ref Range   Glucose-Capillary 129 (*) 70 - 99 mg/dL   Comment 1 Notify RN    GLUCOSE, CAPILLARY     Status: Abnormal   Collection Time    01/23/14  9:34 PM      Result Value Ref Range   Glucose-Capillary 128 (*) 70 - 99 mg/dL  GLUCOSE, CAPILLARY     Status: None   Collection Time    01/24/14  7:11 AM      Result Value Ref Range   Glucose-Capillary 78  70 - 99 mg/dL   Comment 1 Notify RN    GLUCOSE, CAPILLARY     Status: Abnormal   Collection Time    01/24/14 11:51 AM      Result Value Ref Range   Glucose-Capillary 180 (*) 70 - 99 mg/dL   Comment 1 Notify RN    GLUCOSE, CAPILLARY     Status: Abnormal   Collection Time    01/24/14  4:17 PM      Result Value Ref Range   Glucose-Capillary 187 (*) 70 - 99 mg/dL  GLUCOSE, CAPILLARY  Status: Abnormal   Collection Time    01/24/14  9:10 PM      Result Value Ref Range   Glucose-Capillary 184 (*) 70 - 99 mg/dL  GLUCOSE, CAPILLARY     Status: None   Collection Time    01/25/14  7:30 AM      Result Value Ref Range   Glucose-Capillary 94  70 - 99 mg/dL   Comment 1 Notify RN       HEENT: normal Cardio: RRR Resp: CTA B/L GI: BS positive and NT, ND Extremity:  No Edema Skin:   Intact Neuro: Alert/Oriented, Cranial Nerve II-XII normal, Normal Sensory, Normal Motor and Abnormal FMC Ataxic/ dec FMC Musc/Skel:  Other sacral and coccygeal tenderness Gen NAD Mood/affect delayed responses but otherwise approp  Assessment/Plan: 1. Functional deficits secondary to Left pontine infarct which require 3+ hours per day of interdisciplinary therapy in a comprehensive inpatient rehab setting. Physiatrist is providing close team supervision and 24 hour management of active medical problems listed below. Physiatrist and rehab team continue to assess barriers to discharge/monitor patient progress toward functional and  medical goals. FIM: FIM - Bathing Bathing Steps Patient Completed: Chest;Right Arm;Left Arm;Abdomen;Front perineal area;Right upper leg;Left upper leg;Right lower leg (including foot);Left lower leg (including foot);Buttocks Bathing: 5: Supervision: Safety issues/verbal cues  FIM - Upper Body Dressing/Undressing Upper body dressing/undressing steps patient completed: Thread/unthread right sleeve of pullover shirt/dresss;Thread/unthread left sleeve of pullover shirt/dress;Put head through opening of pull over shirt/dress;Pull shirt over trunk Upper body dressing/undressing: 5: Set-up assist to: Obtain clothing/put away FIM - Lower Body Dressing/Undressing Lower body dressing/undressing steps patient completed: Thread/unthread right underwear leg;Thread/unthread left underwear leg;Pull underwear up/down;Thread/unthread right pants leg;Thread/unthread left pants leg;Pull pants up/down;Fasten/unfasten pants;Don/Doff right sock;Don/Doff left sock;Don/Doff right shoe;Don/Doff left shoe;Fasten/unfasten right shoe;Fasten/unfasten left shoe Lower body dressing/undressing: 4: Steadying Assist  FIM - Toileting Toileting steps completed by patient: Performs perineal hygiene Toileting Assistive Devices: Grab bar or rail for support Toileting: 2: Max-Patient completed 1 of 3 steps  FIM - Radio producer Devices: Grab bars Toilet Transfers: 4-From toilet/BSC: Min A (steadying Pt. > 75%);4-To toilet/BSC: Min A (steadying Pt. > 75%)  FIM - Bed/Chair Transfer Bed/Chair Transfer Assistive Devices: Arm rests Bed/Chair Transfer: 0: Activity did not occur  FIM - Locomotion: Wheelchair Distance: 80 Locomotion: Wheelchair: 2: Travels 50 - 149 ft with supervision, cueing or coaxing FIM - Locomotion: Ambulation Locomotion: Ambulation Assistive Devices: Administrator Ambulation/Gait Assistance: 4: Min assist Locomotion: Ambulation: 2: Travels 50 - 149 ft with minimal assistance  (Pt.>75%)  Comprehension Comprehension Mode: Auditory Comprehension: 5-Follows basic conversation/direction: With extra time/assistive device  Expression Expression Mode: Verbal Expression: 5-Expresses basic needs/ideas: With extra time/assistive device  Social Interaction Social Interaction: 6-Interacts appropriately with others with medication or extra time (anti-anxiety, antidepressant).  Problem Solving Problem Solving: 5-Solves basic 90% of the time/requires cueing < 10% of the time  Memory Memory: 4-Recognizes or recalls 75 - 89% of the time/requires cueing 10 - 24% of the time  Medical Problem List and Plan:  1. Functional deficits secondary to acute left paramedian pontine infarct  2. DVT Prophylaxis/Anticoagulation: Subcutaneous heparin. Monitor platelet counts and any signs of bleeding.  3. Pain Management: Clinoril 150 mg twice a day, Neurontin 600 mg 3 times a day, Hydrocodone with increase dose as needed. Monitor with increased mobility , qhs oxycontin until D/C 4. Mood/memory loss/depression: Wellbutrin 100 mg twice a day. Aricept 10 mg each bedtime, Seroquel 200 mg each bedtime, Zoloft 200 mg  daily.  5. Neuropsych: This patient is capable of making decisions on his own behalf.  6. Hypertension. Norvasc 10 mg daily, Cozaar 100 mg daily, Lopressor 50 mg twice a day.bradycardia reduce BB dose  7. Diabetes mellitus with peripheral neuropathy. Hemoglobin A1c 10.6. Lantus insulin 40 units twice a day, NovoLog 12 units 3 times a day. Check blood sugars a.c. and at bedtime. Provide diabetic teaching  8. Hyperlipidemia. Lipitor  9.  Sacral pain pt reports x[facility] at [facility], repeat confirms sacrococcygeal jct fx, analgesics and avoid direct pressure 10.  Constipation- senna LOS (Days) 7 A FACE TO FACE EVALUATION WAS PERFORMED  Norah Fick E 01/25/2014, 8:21 AM

## 2014-01-25 NOTE — Progress Notes (Signed)
Occupational Therapy Session Notes  Patient Details  Name: Maurice Stone MRN: [id number] Date of Birth: 13-Jun-1941  Today's Date: 01/25/2014 Time: 2505-3976 and 137-212 Time Calculation (min): 54 min (missed 6 min due to RN providing medication) and 35 min  Short Term Goals: Week 1:  OT Short Term Goal 1 (Week 1): Patient will be supervision for bathing OT Short Term Goal 2 (Week 1): Patient will be min a for dressing OT Short Term Goal 3 (Week 1): Patient will be min a for toilet transfers OT Short Term Goal 4 (Week 1): Patient will be min a for shower transfers OT Short Term Goal 5 (Week 1): Patient will be supervision for toileting  Skilled Therapeutic Interventions/Progress Updates:  1)  Patient seated in w/c upon arrival with RN providing medication.  Engaged in self care retraining to include shower, dress and groom tasks.  Focused session on walker safety, gathering supplies to prepare for shower, memory related to the routine we have established, forced use of RUE & RLE, safety awareness.  Patient required reminders not to let his feet touch together when standing to a wider BOS secondary to LOB 2 times.  Patient demonstrates carry over at least 50 % of the time yet it is not always consistent.  2)  Patient seated on toilet upon arrival with instruction from NT to call once he was finished with BM and before he stood up.  Patient did what he was asked and this clinician answered the call bell.  Patient with mild LOB while standing to fasten his pants and was able to self correct.  While ambulating to the therapy kitchen with RW, patient with several events of right knee buckling.  Patient reports feeling very tired so after navigating and exploring kitchen, patient sat down in kitchen dining chair without arms.  Patient with 2 failed attempts to stand up from chair even after detailed instruction.  Patient fell back onto seat both times.  Patient starts by leaning forward however, he does  not follow through with leaning forward for anterior weight shift over his feet and extends at his hips and back which throws him back in the chair.  Patient required assist to keep his head and shoulders forward through the entire transition until standing.  Discussed concern for patient's safety for returning home therefore asked him to consider only using his w/c in his apartment and only walking with RW when family present.  Patient ambulated back to his room with RW.  Therapy Documentation Precautions:  Precautions Precautions: Fall Precaution Comments: R sided hemiparesis and R knee buckling Restrictions Weight Bearing Restrictions: No Other Position/Activity Restrictions: fall risk Pain: 1)  Reports 7/10 in tailbone, premedicated, rest and repositioned 2)  Reports tailbone pain, not rated, rest and repositioned ADL: See FIM for current functional status  Therapy/Group: Individual Therapy both sessions  [place], [place] 01/25/2014, 11:09 AM

## 2014-01-25 NOTE — Progress Notes (Signed)
Physical Therapy Weekly Progress Note  Patient Details  Name: Maurice Stone MRN: [id number] Date of Birth: 1940/11/12  Beginning of progress report period: January 19, 2014 End of progress report period: January 25, 2014  Today's Date: 01/25/2014 Time: 8:03-9:00 (31mn) and 12:57-13:30 (374m)     Patient has met 3 of 4 short term goals.  Pt making good progress in motor control, R-sided strength and attention, pain management, and cognition. Pt can verbalize awareness of deficits and safety issues, but does not carry them over into functional mobility. Pt still poses high falls risk during transitional movement due to decreased R-sided motor control.  Pt is now walking >100' with RW and performs stairs with minimal assistance, but [facility] due to decreased caregiver support.  Patient continues to demonstrate the following deficits: R-sided weakness and motor control, righting reactions, pain, and therefore will continue to benefit from skilled PT intervention to enhance overall performance with activity tolerance, balance, ability to compensate for deficits, functional use of  right upper extremity and right lower extremity and coordination.  Patient progressing toward long term goals..  Plan of care revisions: Therapy goals adjusted to reflect pt's plan for DC - will upgrade transfer goal to [facility] .  PT Short Term Goals Week 1:  PT Short Term Goal 1 (Week 1): Patient will perform functional transfers with supervision. PT Short Term Goal 1 - Progress (Week 1): Met PT Short Term Goal 2 (Week 1): Patient will perform gait training 150' with LRAD and minA. PT Short Term Goal 2 - Progress (Week 1): Progressing toward goal PT Short Term Goal 3 (Week 1): Patient will negotiate 5 steps with one handrail and minA for community access. PT Short Term Goal 3 - Progress (Week 1): Met PT Short Term Goal 4 (Week 1): Patient will demonstrate carry over will functional  mobility techniques with min cues. PT Short Term Goal 4 - Progress (Week 1): Met Week 2:  PT Short Term Goal 1 (Week 2): STG=LTG due to LOS, S overall  Skilled Therapeutic Interventions/Progress Updates:  First tx focused on CVA prevention/education, functional mobiltiy, gait with RW, and NMR via forced use, manual facilitation, and verbal cues  Educated pt with Stroke Recovery Booklet, highlighting pertinent sections for diet, lifestyle and exercise, pt to read and teachback  for afternoon tx.   Performed stand-pivot tranfers with S 4/5 trials, but up mod a needed for fall recovery x1 after walk during turn transition.   Performed kinetron x5m42msitting and standing for weight shifting and motor control task. Pt fatigued after 1 min standing ex.   Gait [id number] RW and min A overall with cues for step width, RW management. Manual facilitation for weight shifting and pt noted to have some scissoring, esp during turns.     _________________________________  Second tx focused on [facility] and safety and [place] HEP instruction for fall risk reduction. Pt was up reading Stroke recovery handbook, but unable to recall healthy meal options.   [facility] and [facility]. Pt agrees that a WC level would be safest option considering fall history. Discussed navigating apartment complex, laundry facility, etc. Will continue to address this with pt.   Pt propelled WC x150' with S and bil UE/LEs. Cues for turning and navigation efficiency.   Instructed pt in [facility] during transitions sit<>stands and brake use at all times.   Instructed pt in [facility]  handouts, needing cues for technique and safety. Pt would like a HEP that he can do in supine and sitting for home safety.    Therapy Documentation Precautions:  Precautions Precautions: Fall Precaution Comments: R sided hemiparesis and R knee buckling Restrictions Weight Bearing Restrictions: No Other  Position/Activity Restrictions: fall risk   Vital Signs: Therapy Vitals Temp: 97.7 F (36.5 C) Temp src: Oral Pulse Rate: 65 Resp: 19 BP: 117/65 mmHg Patient Position (if appropriate): Lying Oxygen Therapy SpO2: 97 % O2 Device: None (Room air) Pain: 7/10 tailbone pain, repositioned and modified tx prn. Pt premedicated  Pain Assessment Pain Score: Asleep Other Treatments:    See FIM for current functional status  Therapy/Group: Individual Therapy  Kennieth Rad, PT, DPT  01/25/2014, 8:40 AM

## 2014-01-26 ENCOUNTER — Inpatient Hospital Stay (HOSPITAL_COMMUNITY): Payer: Medicare Other | Admitting: *Deleted

## 2014-01-26 ENCOUNTER — Inpatient Hospital Stay (HOSPITAL_COMMUNITY): Payer: Medicare Other | Admitting: Physical Therapy

## 2014-01-26 LAB — GLUCOSE, CAPILLARY
Glucose-Capillary: 107 mg/dL — ABNORMAL HIGH (ref 70–99)
Glucose-Capillary: 158 mg/dL — ABNORMAL HIGH (ref 70–99)
Glucose-Capillary: 132 mg/dL — ABNORMAL HIGH (ref 70–99)
Glucose-Capillary: 220 mg/dL — ABNORMAL HIGH (ref 70–99)
Glucose-Capillary: 168 mg/dL — ABNORMAL HIGH (ref 70–99)

## 2014-01-26 NOTE — Progress Notes (Signed)
Maurice Stone is a 73 y.o. male [birth year] [id number]  Subjective: No new complaints this am.  Slept well. Feeling OK.  Objective: Vital signs in last 24 hours: Temp:  [97.4 F (36.3 C)-98.8 F (37.1 C)] 97.4 F (36.3 C) (07/04 0639) Pulse Rate:  [50-87] 50 (07/04 0639) Resp:  [18-19] 19 (07/04 0639) BP: (104-139)/(55-74) 110/55 mmHg (07/04 0639) SpO2:  [94 %-100 %] 96 % (07/04 0639) Weight change:  Last BM Date: 01/25/14  Intake/Output from previous day: 07/03 0701 - 07/04 0700 In: 840 [P.O.:840] Out: 1900 [Urine:1900] Last cbgs: CBG (last 3)   Recent Labs  01/25/14 1652 01/25/14 2153 01/26/14 0746  GLUCAP 132* 159* 107*     Physical Exam General: No apparent distress   HEENT: not dry Lungs: Normal effort. Lungs clear to auscultation, no crackles or wheezes. Cardiovascular: Regular rate and rhythm, no edema Abdomen: S/NT/ND; BS(+) Musculoskeletal:  unchanged Neurological: No new neurological deficits Wounds: N/A    Skin: clear  Aging changes Mental state: Alert, oriented, cooperative    Lab Results: BMET    Component Value Date/Time   NA 139 01/21/2014 0500   K 4.6 01/21/2014 0500   CL 102 01/21/2014 0500   CO2 24 01/21/2014 0500   GLUCOSE 129* 01/21/2014 0500   BUN 15 01/21/2014 0500   CREATININE 1.01 01/21/2014 0500   CALCIUM 9.3 01/21/2014 0500   GFRNONAA 73* 01/21/2014 0500   GFRAA 84* 01/21/2014 0500   CBC    Component Value Date/Time   WBC 8.3 01/21/2014 0500   RBC 4.55 01/21/2014 0500   HGB 13.8 01/21/2014 0500   HCT 41.9 01/21/2014 0500   PLT 125* 01/21/2014 0500   MCV 92.1 01/21/2014 0500   MCH 30.3 01/21/2014 0500   MCHC 32.9 01/21/2014 0500   RDW 14.5 01/21/2014 0500   LYMPHSABS 3.0 01/21/2014 0500   MONOABS 0.9 01/21/2014 0500   EOSABS 0.2 01/21/2014 0500   BASOSABS 0.0 01/21/2014 0500    Studies/Results: No results found.  Medications: I have reviewed the patient's current medications.  Assessment/Plan:  1. Functional deficits  secondary to acute left paramedian pontine infarct  2. DVT Prophylaxis/Anticoagulation: Subcutaneous heparin. Monitor platelet counts and any signs of bleeding.  3. Pain Management: Clinoril 150 mg twice a day, Neurontin 600 mg 3 times a day, Hydrocodone as needed. Monitor with increased mobility  4. Mood/memory loss/depression: Wellbutrin 100 mg twice a day. Aricept 10 mg each bedtime, Seroquel 200 mg each bedtime, Zoloft 200 mg daily.  5. Neuropsych: This patient is capable of making decisions on his own behalf.  6. Hypertension. Norvasc 10 mg daily, Cozaar 100 mg daily, Lopressor 50 mg twice a day. Monitor with increased mobility  7. Diabetes mellitus with peripheral neuropathy. Hemoglobin A1c 10.6. Lantus insulin 40 units twice a day, NovoLog 12 units 3 times a day. Check blood sugars a.c. and at bedtime. Provide diabetic teaching  8. Hyperlipidemia. Lipitor 9. Urinary retention: Foley if needed. Doing ok on Flomax  Length of stay, days: [place] , MD 01/26/2014, 8:54 AM

## 2014-01-26 NOTE — Progress Notes (Signed)
Occupational Therapy Session Note  Patient Details  Name: Maurice Stone MRN: [id number] Date of Birth: May 21, 1941  Today's Date: 01/26/2014 Time: 1455-1550 Time Calculation (min): 55 min  Short Term Goals: Week 1:  OT Short Term Goal 1 (Week 1): Patient will be supervision for bathing OT Short Term Goal 2 (Week 1): Patient will be min a for dressing OT Short Term Goal 3 (Week 1): Patient will be min a for toilet transfers OT Short Term Goal 4 (Week 1): Patient will be min a for shower transfers OT Short Term Goal 5 (Week 1): Patient will be supervision for toileting  Skilled Therapeutic Interventions/Progress Updates:    Pt. Propelled wc to OT kitchen with no assistance.  Addressed gathering supplies from cabinets and refrigerator to make lemonade.  Pt. Needed close supervision when pouring sugar into pitcher so as not to spill.  He stirred and washed dishes with attention to detail.  He enjoyed the activity and asked if he could make a sandwich on another session.  Pt. Propelled wc back to room with no assistance.    Therapy Documentation Precautions:  Precautions Precautions: Fall Precaution Comments: R sided hemiparesis and R knee buckling Restrictions Weight Bearing Restrictions: No Other Position/Activity Restrictions: fall risk       Pain:  none         :    See FIM for current functional status  Therapy/Group: Individual Therapy  Lisa Roca 01/26/2014, 6:42 PM

## 2014-01-26 NOTE — Progress Notes (Signed)
Physical Therapy Session Note  Patient Details  Name: Maurice Stone MRN: [id number] Date of Birth: 31-Oct-1940  Today's Date: 01/26/2014 Time: 0800-0829 Time Calculation (min): 29 min  Short Term Goals: Week 1:  PT Short Term Goal 1 (Week 1): Patient will perform functional transfers with supervision. PT Short Term Goal 1 - Progress (Week 1): Met PT Short Term Goal 2 (Week 1): Patient will perform gait training 150' with LRAD and minA. PT Short Term Goal 2 - Progress (Week 1): Progressing toward goal PT Short Term Goal 3 (Week 1): Patient will negotiate 5 steps with one handrail and minA for community access. PT Short Term Goal 3 - Progress (Week 1): Met PT Short Term Goal 4 (Week 1): Patient will demonstrate carry over will functional mobility techniques with min cues. PT Short Term Goal 4 - Progress (Week 1): Met  Skilled Therapeutic Interventions/Progress Updates:  Pt was seen bedside in the am, eating breakfast on edge of bed. Pt transferred sit to stand from edge of bed with rolling walker and S to min guard. Pt ambulated with rolling walker and min guard for 150 and 200 feet, Ace wrap to R knee, pt ambulates with step through gait pattern, and verbal cues for safety. Pt propelled w/c about 150 feet with B UE and LEs, S and occasional verbal cues to assist with problem solving. Pt left sitting up in w/c with call bell within reach.   Therapy Documentation Precautions:  Precautions Precautions: Fall Precaution Comments: R sided hemiparesis and R knee buckling Restrictions Weight Bearing Restrictions: No Other Position/Activity Restrictions: fall risk General:   Pain: No c/o pain.    Locomotion : Ambulation Ambulation/Gait Assistance: 4: Min guard   See FIM for current functional status  Therapy/Group: Individual Therapy  Dub Amis 01/26/2014, 12:16 PM

## 2014-01-27 ENCOUNTER — Inpatient Hospital Stay (HOSPITAL_COMMUNITY): Payer: Medicare Other | Admitting: Physical Therapy

## 2014-01-27 LAB — GLUCOSE, CAPILLARY
GLUCOSE-CAPILLARY: 140 mg/dL — AB (ref 70–99)
GLUCOSE-CAPILLARY: 182 mg/dL — AB (ref 70–99)
GLUCOSE-CAPILLARY: 217 mg/dL — AB (ref 70–99)
Glucose-Capillary: 190 mg/dL — ABNORMAL HIGH (ref 70–99)

## 2014-01-27 NOTE — Progress Notes (Signed)
Maurice Stone is a 73 y.o. male March 17, 1941 [id number]  Subjective: No new complaints.  Slept well. Feeling OK.  Objective: Vital signs in last 24 hours: Temp:  [97.5 F (36.4 C)-97.7 F (36.5 C)] 97.7 F (36.5 C) (07/05 0617) Pulse Rate:  [55-65] 65 (07/05 0617) Resp:  [18-19] 19 (07/05 0617) BP: (105-124)/(53-63) 109/63 mmHg (07/05 0617) SpO2:  [95 %-97 %] 95 % (07/05 0617) Weight change:  Last BM Date: 01/26/14  Intake/Output from previous day: 07/04 0701 - 07/05 0700 In: 720 [P.O.:720] Out: 2500 [Urine:2500] Last cbgs: CBG (last 3)   Recent Labs  01/26/14 1635 01/26/14 2042 01/27/14 0735  GLUCAP 220* 168* 140*     Physical Exam General: No apparent distress   HEENT: not dry Lungs: Normal effort. Lungs clear to auscultation, no crackles or wheezes. Cardiovascular: Regular rate and rhythm, no edema Abdomen: S/NT/ND; BS(+) Musculoskeletal:  unchanged Neurological: No new neurological deficits Wounds: N/A    Skin: clear  Aging changes Mental state: Alert, oriented, cooperative    Lab Results: BMET    Component Value Date/Time   NA 139 01/21/2014 0500   K 4.6 01/21/2014 0500   CL 102 01/21/2014 0500   CO2 24 01/21/2014 0500   GLUCOSE 129* 01/21/2014 0500   BUN 15 01/21/2014 0500   CREATININE 1.01 01/21/2014 0500   CALCIUM 9.3 01/21/2014 0500   GFRNONAA 73* 01/21/2014 0500   GFRAA 84* 01/21/2014 0500   CBC    Component Value Date/Time   WBC 8.3 01/21/2014 0500   RBC 4.55 01/21/2014 0500   HGB 13.8 01/21/2014 0500   HCT 41.9 01/21/2014 0500   PLT 125* 01/21/2014 0500   MCV 92.1 01/21/2014 0500   MCH 30.3 01/21/2014 0500   MCHC 32.9 01/21/2014 0500   RDW 14.5 01/21/2014 0500   LYMPHSABS 3.0 01/21/2014 0500   MONOABS 0.9 01/21/2014 0500   EOSABS 0.2 01/21/2014 0500   BASOSABS 0.0 01/21/2014 0500    Studies/Results: No results found.  Medications: I have reviewed the patient's current medications.  Assessment/Plan:  1. Functional deficits secondary to  acute left paramedian pontine infarct  2. DVT Prophylaxis/Anticoagulation: Subcutaneous heparin. Monitor platelet counts and any signs of bleeding.  3. Pain Management: Clinoril 150 mg twice a day, Neurontin 600 mg 3 times a day, Hydrocodone as needed. Monitor with increased mobility  4. Mood/memory loss/depression: Wellbutrin 100 mg twice a day. Aricept 10 mg each bedtime, Seroquel 200 mg each bedtime, Zoloft 200 mg daily.  5. Neuropsych: This patient is capable of making decisions on his own behalf.  6. Hypertension. Norvasc 10 mg daily, Cozaar 100 mg daily, Lopressor 50 mg twice a day. Monitor with increased mobility  7. Diabetes mellitus with peripheral neuropathy. Hemoglobin A1c 10.6. Lantus insulin 40 units twice a day, NovoLog 12 units 3 times a day. Check blood sugars a.c. and at bedtime. Provide diabetic teaching  8. Hyperlipidemia. Lipitor 9. Urinary retention: Foley if needed. Doing ok on Flomax  Cont Rx  Length of stay, days: 9  Walker Kehr , MD 01/27/2014, 9:17 AM

## 2014-01-28 ENCOUNTER — Inpatient Hospital Stay (HOSPITAL_COMMUNITY): Payer: Medicare Other | Admitting: Occupational Therapy

## 2014-01-28 ENCOUNTER — Inpatient Hospital Stay (HOSPITAL_COMMUNITY): Payer: Medicare Other | Admitting: Speech Pathology

## 2014-01-28 ENCOUNTER — Encounter (HOSPITAL_COMMUNITY): Payer: Medicare Other

## 2014-01-28 ENCOUNTER — Inpatient Hospital Stay (HOSPITAL_COMMUNITY): Payer: Medicare Other

## 2014-01-28 DIAGNOSIS — I10 Essential (primary) hypertension: Secondary | ICD-10-CM

## 2014-01-28 DIAGNOSIS — I6330 Cerebral infarction due to thrombosis of unspecified cerebral artery: Secondary | ICD-10-CM

## 2014-01-28 DIAGNOSIS — E1149 Type 2 diabetes mellitus with other diabetic neurological complication: Secondary | ICD-10-CM

## 2014-01-28 LAB — GLUCOSE, CAPILLARY
GLUCOSE-CAPILLARY: 166 mg/dL — AB (ref 70–99)
Glucose-Capillary: 128 mg/dL — ABNORMAL HIGH (ref 70–99)
Glucose-Capillary: 221 mg/dL — ABNORMAL HIGH (ref 70–99)
Glucose-Capillary: 180 mg/dL — ABNORMAL HIGH (ref 70–99)

## 2014-01-28 MED ORDER — INSULIN GLARGINE 100 UNIT/ML ~~LOC~~ SOLN
50.0000 [IU] | Freq: Every day | SUBCUTANEOUS | Status: DC
  Administered 2014-01-29 – 2014-02-02 (×5): 50 [IU] via SUBCUTANEOUS
  Filled 2014-01-28 (×5): qty 0.5

## 2014-01-28 MED ORDER — INSULIN GLARGINE 100 UNIT/ML ~~LOC~~ SOLN
45.0000 [IU] | Freq: Every day | SUBCUTANEOUS | Status: DC
  Administered 2014-01-28 – 2014-02-01 (×5): 45 [IU] via SUBCUTANEOUS
  Filled 2014-01-28 (×6): qty 0.45

## 2014-01-28 NOTE — Progress Notes (Signed)
Speech Language Pathology Discharge Summary  Patient Details  Name: Maurice Stone MRN: [id number] Date of Birth: 1941-05-14  Today's Date: 01/28/2014 Time: 6468-0321 Time Calculation (min): 43 min  Skilled Therapeutic Interventions:  Skilled treatment session focused on addressing dysarthria goals and final education.  Patient was Mod I for recall of speech production strategies (increased volume and over articulation) as well as self-monitoring and correcting of errors during unstructured speech tasks at the sentence-conversation level.  SLP educated patient on home program/tasks that he can complete on his own to further challenge and tax his abilities; however, given that he is fully intelligible skilled services are no longer warranted at this time; patient in agreement.   Patient ready for discharge from SLP services.   Patient has met 2 of 2 long term goals.  Patient to discharge at overall Modified Independent level.  Reasons goals not met: n/a   Clinical Impression/Discharge Summary: Patient has made functional gains and has met 2 out of 2 long term goals during this rehab stay due to improved self-monitoring and correcting of consonant productions, which impacted his overall intelligibility.  Patient is currently overall Mod I for utilization of slow and overall articulated speech as speech intelligibly compensatory strategies.  Patient education has been completed and no follow up SLP services are needed at this time.     Care Partner:  Caregiver Able to Provide Assistance: No  Type of Caregiver Assistance: Physical  Recommendation:  None  Rationale for SLP Follow Up: Other (comment) (n/a)   Equipment: none   Reasons for discharge: Treatment goals met   Patient/Family Agrees with Progress Made and Goals Achieved: Yes   See FIM for current functional status  Carmelia Roller., CCC-SLP [phone]  [place] 01/28/2014, 4:05 PM

## 2014-01-28 NOTE — Progress Notes (Addendum)
Physical Therapy Session Note  Patient Details  Name: Maurice Stone MRN: [id number] Date of Birth: [birth year]  Today's Date: 01/28/2014 Time: 0810-0845 Time Calculation (min): 35 min  Short Term Goals: Week 2:  PT Short Term Goal 1 (Week 2): STG=LTG due to LOS, S overall LTGs modified to reflect emphasis on safe home mobility with limited gait at home only if closely supervised by a trained helper.  Skilled Therapeutic Interventions/Progress Updates:  neuromuscular re-education as below  VCs for w/c prep of legrests before stand pivot transfer.  Pt wants to use legrests on w/c at home, but his ability to remember to manage these is questionable.  Gait with RW x 180' with min guard assist except for turn R and backing up to sit, mod assist due to impulsivity.  Pt left sitting EOB to finish his breakfast, bed alarm on, all needs within reach.  Pt continues to sit in posterior pelvic tilt due to chronic back pain and new sacral fx.    Therapy Documentation Precautions:  Precautions Precautions: Fall Precaution Comments: R sided hemiparesis and R knee buckling Restrictions Weight Bearing Restrictions: No Other Position/Activity Restrictions: fall risk Amount of Missed PT Time (min): 10 Minutes Missed Time Reason: Patient unwilling/refused to participate without medical reason (eating breakfast; very slow) Pain: Pain Assessment Pain Assessment: No/denies pain   Locomotion : Ambulation Ambulation/Gait Assistance: 4: Min assist Wheelchair Mobility Distance: 100    Other Treatments: Treatments Therapeutic Activity: mini squats standing  on compliant mat; calf raises, toe raises on tile; w/c propulsion using bil UEs Neuromuscular Facilitation: Forced use;Activity to increase sustained activation;Activity to increase timing and sequencing Weight Bearing Technique Weight Bearing Technique: yes  See FIM for current functional status  Therapy/Group: Individual  Therapy  Lenford Beddow 01/28/2014, 9:02 AM

## 2014-01-28 NOTE — Progress Notes (Signed)
73 y.o. right handed male with history of hypertension, diabetes mellitus, CAD,COPD as well as CVA 6 years ago with some residual slurred speech maintained on aspirin. Patient lives alone independent prior to admission. Presented 01/15/2014 with frequent falls and right-sided weakness and slurred speech. MRI shows acute nonhemorrhagic left paramedian pontine infarct as well as remote infarct left basal ganglia. MRA of the head without stenosis or occlusion. Echocardiogram with ejection fraction 48% grade 1 diastolic dysfunction. Carotid Dopplers no ICA stenosis. Patient did not receive TPA. Neurology services consulted aspirin increased to 325 mg daily   Subjective/Complaints: No new issues. Denies pain or problems specifically over the weekend.  Review of Systems - Negative except tailbone pain  Objective: Vital Signs: Blood pressure 121/66, pulse 65, temperature 97.9 F (36.6 C), temperature source Oral, resp. rate 20, weight 95.2 kg (209 lb 14.1 oz), SpO2 95.00%. No results found. Results for orders placed during the hospital encounter of 01/18/14 (from the past 72 hour(s))  GLUCOSE, CAPILLARY     Status: Abnormal   Collection Time    01/25/14 11:25 AM      Result Value Ref Range   Glucose-Capillary 131 (*) 70 - 99 mg/dL   Comment 1 Notify RN    GLUCOSE, CAPILLARY     Status: Abnormal   Collection Time    01/25/14  4:52 PM      Result Value Ref Range   Glucose-Capillary 132 (*) 70 - 99 mg/dL  GLUCOSE, CAPILLARY     Status: Abnormal   Collection Time    01/25/14  9:53 PM      Result Value Ref Range   Glucose-Capillary 159 (*) 70 - 99 mg/dL  GLUCOSE, CAPILLARY     Status: Abnormal   Collection Time    01/26/14  7:46 AM      Result Value Ref Range   Glucose-Capillary 107 (*) 70 - 99 mg/dL  GLUCOSE, CAPILLARY     Status: Abnormal   Collection Time    01/26/14 10:52 AM      Result Value Ref Range   Glucose-Capillary 158 (*) 70 - 99 mg/dL  GLUCOSE, CAPILLARY     Status: Abnormal    Collection Time    01/26/14 11:41 AM      Result Value Ref Range   Glucose-Capillary 132 (*) 70 - 99 mg/dL  GLUCOSE, CAPILLARY     Status: Abnormal   Collection Time    01/26/14  4:35 PM      Result Value Ref Range   Glucose-Capillary 220 (*) 70 - 99 mg/dL  GLUCOSE, CAPILLARY     Status: Abnormal   Collection Time    01/26/14  8:42 PM      Result Value Ref Range   Glucose-Capillary 168 (*) 70 - 99 mg/dL  GLUCOSE, CAPILLARY     Status: Abnormal   Collection Time    01/27/14  7:35 AM      Result Value Ref Range   Glucose-Capillary 140 (*) 70 - 99 mg/dL  GLUCOSE, CAPILLARY     Status: Abnormal   Collection Time    01/27/14 11:26 AM      Result Value Ref Range   Glucose-Capillary 190 (*) 70 - 99 mg/dL  GLUCOSE, CAPILLARY     Status: Abnormal   Collection Time    01/27/14  4:51 PM      Result Value Ref Range   Glucose-Capillary 182 (*) 70 - 99 mg/dL  GLUCOSE, CAPILLARY     Status: Abnormal  Collection Time    01/27/14  9:07 PM      Result Value Ref Range   Glucose-Capillary 217 (*) 70 - 99 mg/dL  GLUCOSE, CAPILLARY     Status: Abnormal   Collection Time    01/28/14  7:21 AM      Result Value Ref Range   Glucose-Capillary 128 (*) 70 - 99 mg/dL   Comment 1 Notify RN       HEENT: normal Cardio: RRR Resp: CTA B/L GI: BS positive and NT, ND Extremity:  No Edema Skin:   Intact Neuro: Alert/Oriented, Cranial Nerve II-XII normal, Normal Sensory, Normal Motor and Abnormal FMC Ataxic/ dec FMC Musc/Skel:  Other sacral and coccygeal tenderness Gen NAD Mood/affect delayed responses but otherwise approp  Assessment/Plan: 1. Functional deficits secondary to Left pontine infarct which require 3+ hours per day of interdisciplinary therapy in a comprehensive inpatient rehab setting. Physiatrist is providing close team supervision and 24 hour management of active medical problems listed below. Physiatrist and rehab team continue to assess barriers to discharge/monitor patient  progress toward functional and medical goals. FIM: FIM - Bathing Bathing Steps Patient Completed: Chest;Right Arm;Left Arm;Abdomen;Front perineal area;Right upper leg;Left upper leg;Right lower leg (including foot);Left lower leg (including foot);Buttocks Bathing: 4: Steadying assist  FIM - Upper Body Dressing/Undressing Upper body dressing/undressing steps patient completed: Thread/unthread right sleeve of pullover shirt/dresss;Thread/unthread left sleeve of pullover shirt/dress;Put head through opening of pull over shirt/dress;Pull shirt over trunk Upper body dressing/undressing: 5: Supervision: Safety issues/verbal cues FIM - Lower Body Dressing/Undressing Lower body dressing/undressing steps patient completed: Thread/unthread right underwear leg;Thread/unthread left underwear leg;Pull underwear up/down;Thread/unthread right pants leg;Thread/unthread left pants leg;Pull pants up/down;Fasten/unfasten pants;Don/Doff right sock;Don/Doff left sock;Don/Doff right shoe;Don/Doff left shoe;Fasten/unfasten right shoe;Fasten/unfasten left shoe Lower body dressing/undressing: 5: Set-up assist to: Obtain clothing  FIM - Toileting Toileting steps completed by patient: Performs perineal hygiene;Adjust clothing after toileting Toileting Assistive Devices: Grab bar or rail for support Toileting: 3: Mod-Patient completed 2 of 3 steps  FIM - Radio producer Devices: Grab bars;Walker Toilet Transfers: 4-From toilet/BSC: Min A (steadying Pt. > 75%);4-To toilet/BSC: Min A (steadying Pt. > 75%)  FIM - Bed/Chair Transfer Bed/Chair Transfer Assistive Devices: Arm rests;Walker Bed/Chair Transfer: 5: Sit > Supine: Supervision (verbal cues/safety issues)  FIM - Locomotion: Wheelchair Distance: 150 Locomotion: Wheelchair: 5: Travels 150 ft or more: maneuvers on rugs and over door sills with supervision, cueing or coaxing FIM - Locomotion: Ambulation Locomotion: Ambulation Assistive  Devices: Administrator Ambulation/Gait Assistance: 4: Min assist Locomotion: Ambulation: 1: Travels less than 50 ft with minimal assistance (Pt.>75%)  Comprehension Comprehension Mode: Auditory Comprehension: 5-Follows basic conversation/direction: With extra time/assistive device  Expression Expression Mode: Verbal Expression: 5-Expresses basic needs/ideas: With extra time/assistive device  Social Interaction Social Interaction: 6-Interacts appropriately with others with medication or extra time (anti-anxiety, antidepressant).  Problem Solving Problem Solving: 5-Solves basic 90% of the time/requires cueing < 10% of the time  Memory Memory: 4-Recognizes or recalls 75 - 89% of the time/requires cueing 10 - 24% of the time  Medical Problem List and Plan:  1. Functional deficits secondary to acute left paramedian pontine infarct  2. DVT Prophylaxis/Anticoagulation: Subcutaneous heparin. Monitor platelet counts and any signs of bleeding.  3. Pain Management: Clinoril 150 mg twice a day, Neurontin 600 mg 3 times a day, Hydrocodone with increase dose as needed. Monitor with increased mobility , qhs oxycontin until D/C 4. Mood/memory loss/depression: Wellbutrin 100 mg twice a day. Aricept 10 mg each bedtime,  Seroquel 200 mg each bedtime, Zoloft 200 mg daily.  5. Neuropsych: This patient is capable of making decisions on his own behalf.  6. Hypertension. Norvasc 10 mg daily, Cozaar 100 mg daily, Lopressor 50 mg twice a day.bradycardia reduce BB dose  7. Diabetes mellitus with peripheral neuropathy. Hemoglobin A1c 10.6. Lantus insulin 40 units twice a day, NovoLog 12 units 3 times a day.     8. Hyperlipidemia. Lipitor  9.  Sacral pain pt reports xrays at [facility] repeat confirms sacrococcygeal jct fx, analgesics and avoid direct pressure 10.  Constipation- senna LOS (Days) 10 A FACE TO FACE EVALUATION WAS PERFORMED  Magdeline Prange T 01/28/2014, 8:42 AM

## 2014-01-28 NOTE — Progress Notes (Signed)
Occupational Therapy Weekly Progress Note And Treatment Session Notes  Patient Details  Name: Maurice Stone MRN: [id number] Date of Birth: [birth year]  Beginning of progress report period: January 20, 2014 End of progress report period: January 28, 2014  Today's Date: 01/28/2014 Time: 0901-1001 and 303-346 Time Calculation (min): 60 min and 38 min  Patient has met 4 of 5 short term goals.  Patient making slow progress toward OT LTGs however, he is occasionally Min/Steady assist with toileting secondary to LOB in standing.    Patient continues to demonstrate the following deficits: abnormal posture, acute pain, cognitive deficits including short term memory deficits, judgement, safety, decision making, problem solving, impaired timing, sequencing, abnormal tone, unbalanced muscle activation, poor ability to sustain muscle activation, and hemiplegia affecting his dominant side.  Therefore, patient is a significant fall risk and will continue to benefit from skilled OT intervention to enhance overall performance with BADL, iADL and Reduce care partner burden.  Prior to hospitalization, patient could complete basic ADL's with independent however, per chart and patient's report het was falling daily at home and therefore question whether patient was truly independent.  Patient will not have the recommended 24/7 supervision and assistance therefore OT LTGs have been revised to Mod I at a w/c level to include no ambulating unless patient uses an AD and a trained individual is assisting and Supervision with shower transfers using tub bench.  OT has not seen any family or friends in sessions to train them to prepare for assisting at home.    OT Short Term Goals Week 1:   OT Short Term Goal 1 (Week 1): Patient will be supervision for bathing OT Short Term Goal 1 - Progress (Week 1): Met OT Short Term Goal 2 (Week 1): Patient will be min a for dressing OT Short Term Goal 2 - Progress (Week 1): Met OT Short  Term Goal 3 (Week 1): Patient will be min a for toilet transfers OT Short Term Goal 3 - Progress (Week 1): Met OT Short Term Goal 4 (Week 1): Patient will be min a for shower transfers OT Short Term Goal 4 - Progress (Week 1): Met OT Short Term Goal 5 (Week 1): Patient will be supervision for toileting OT Short Term Goal 5 - Progress (Week 1): Partly met Week 2:  OT Short Term Goal 1 (Week 2): STG=Revised LTGs of overall Mod I at a w/c level and Supervision with shower transfers  Skilled Therapeutic Interventions/Progress Updates:  1)  Patient sitting EOB and had just finished breakfast.  Engaged in self care retraining to include shower, dress and groom.  Focused session on attention, safety awareness, memory, problem solving, attention to right side of body, forced use of RUE & RLE during all BADL and functional mobility.  Began session by allowing patient to recall our regular routine each morning related to preparing for and completing BADL session.  Patient ambulated without any assistive device for ~4 steps.  After it was brought to his attention that the physical therapist is the expert regarding need for using RW for all ambulation, he got his walker.  Patient them began to gather items to prepare for bath and dress however he could not remember where some of his clothes were located, very disorganized and extremely inefficient by going back and forth to the same place, dropping items on the floor and unsafely trying to pick them up.  On one occasion patient had begun to tire out and not attending to the placement  of his RLE resulting in a significant LOB which required Total assist to recover.  Reviewed this clinicians concern for patient to discharge with no supervision and physical assistance at home and therefore, will be revising LTGs to w/c level only.  2)  Patient resting in w/c upon arrival.  Engaged in simple kitchen cooking task at a w/c level.  Focused session on safety, memory,  problem solving,  sequencing, forced use of RUE, w/c mobility during functional tasks and task modification.  Patient was disorganized given this familiar task performed from a w/c level and while scrambling/mixing the egg and milk in his lap he spilled the egg on his clothes, w/c and floor and required assistance for cleanup.  Reviewed need for patient to begin to practice all functional task from a w/c level secondary to this is there recommendation for discharge.  The RW was removed from his hospital room secondary to ambulation will only occur during caregiver training.  Therapy Documentation Precautions:  Precautions Precautions: Fall Precaution Comments: R sided hemiparesis and R knee buckling Restrictions Weight Bearing Restrictions: No Other Position/Activity Restrictions: fall risk Pain: 1) After standing several times in session, patient reports back hurting.  Not rated, rest and repositioned. 2) No report of pain ADL: See FIM for current functional status  Therapy/Group: Individual Therapy for both sessions  [place], [place] 01/28/2014, 10:22 AM

## 2014-01-29 ENCOUNTER — Inpatient Hospital Stay (HOSPITAL_COMMUNITY): Payer: Medicare Other | Admitting: Occupational Therapy

## 2014-01-29 ENCOUNTER — Inpatient Hospital Stay (HOSPITAL_COMMUNITY): Payer: Medicare Other | Admitting: *Deleted

## 2014-01-29 LAB — GLUCOSE, CAPILLARY
GLUCOSE-CAPILLARY: 205 mg/dL — AB (ref 70–99)
Glucose-Capillary: 94 mg/dL (ref 70–99)
Glucose-Capillary: 87 mg/dL (ref 70–99)
Glucose-Capillary: 169 mg/dL — ABNORMAL HIGH (ref 70–99)

## 2014-01-29 NOTE — Progress Notes (Signed)
Occupational Therapy Session Note  Patient Details  Name: Maurice Stone MRN: [id number] Date of Birth: 02/05/1941  Today's Date: 01/29/2014 Time: 0802-0902 Time Calculation (min): 60 min  Short Term Goals: Week 2:  OT Short Term Goal 1 (Week 2): STG=Revised LTGs of overall Mod I at a w/c level and Supervision with shower transfers  Skilled Therapeutic Interventions/Progress Updates:  Patient eating breakfast upon arrival and agreed to have his breakfast warmed up so he can finish after OT session.  Engaged in self care retraining to include shower, dress and groom tasks.  Focused session on completing all functional mobility from a w/c level to include gather supplies and propel to the therapy apartment to shower in the tub/shower combo to simulate bathroom set up at home.  Also focused on activity tolerance, attention to left side, forced use of LUE, issued LH sponge to wash feet, safety awareness and correct use of tub bench in tub shower combo to include remain seated.  Patient performed squat pivot/stand step transfers and at least one hand in contact with a stable surface in an effort to stay low and safe.  Therapy Documentation Precautions:  Precautions Precautions: Fall Precaution Comments: R sided hemiparesis and R knee buckling Restrictions Weight Bearing Restrictions: No Other Position/Activity Restrictions: fall risk Pain: 2/10 repositioned, rest ADL: See FIM for current functional status  Therapy/Group: Individual Therapy  Lincy Belles, [place] 01/29/2014, 9:31 AM

## 2014-01-29 NOTE — Progress Notes (Signed)
Recreational Therapy Session Note  Patient Details  Name: Maurice Stone MRN: [id number] Date of Birth: [birth year] Today's Date: 01/29/2014  Pain: no c/o Skilled Therapeutic Interventions/Progress Updates: Session focused on activity tolerance, ambulation, discharge planning.  Discussion with pt about use of leisure time & adaptations for activities.  Pt required min cues for problem solving through adaptations.  Reiterated that team is recommending w/c level at discharge, pt agreeable.  Pt ambulated with bilateral HHA.  [place] 01/29/2014, 12:08 PM

## 2014-01-29 NOTE — Progress Notes (Signed)
Physical Therapy Session Note  Patient Details  Name: Maurice Stone MRN: [id number] Date of Birth: 12-28-1940  Today's Date: 01/29/2014 Time: 9:40-11:40 (140min)   Short Term Goals: Week 2:  PT Short Term Goal 1 (Week 2): STG=LTG due to LOS, S overall  Skilled Therapeutic Interventions/Progress Updates:  Tx focused on functional mobility in the apartment and community settings at [facility] level, as well as static and dynamic NMR for RLE activation. Pt seems to understand safety importance of DC at [facility] level due to decreased improvements in balance and memory, however he continues to demonstrate unsafe practices despite repeated cues and instruction due to impaired memory.   Discussed at length home navigation, WC management, meals, and transfers. Pt demonstrated carryover of instruction for transfers 50% of the time for hand placement and turning technuiqe. Throughout tx, performed transfers WC<>bed, furniture, benches, and Nustep. Pt able to complete safe stand/squat transfer with cues, but unable to perform safely without (turning wrong direction, not using hands, etc.)     Pt performed apartment management including making bed after simulated night rest, WC navigation in tight quarters, and kitchen negotiation with cues for safety and modified techniques for accessing/transporting objects. Pt demonstrates insufficient problem solving and needed cues for brake use.   Performed +2 HHA gait training for dynamic balance challenge. Pt continues to demonstrate unsafe scissoring R over L during gait, straight and during turns.   Performed community mobility at [facility] level including uneven and inclined surfaces, ramps, curb cuts, and busy environments. Pt needed S cues for controlling speed and energy efficiency. Discussed laundry access as well as healthy meal options that would be safe for home. Pt hopeful that ex-wife can assist with groceries and meals, and son can check on him daily.   Performed nmr in  sitting at Nustep (10 min level 5, 60spm) for increased coordination and timing as well as step-taps x10 R/L in standing at stairs. Pt demonstrated decreased safety awareness as RLE began to melt into flexion, he did not correct with UEs or stop task, but continued on until needed assist to prevent fall.   Performed 5 stairs with 2 rails and min a overall for steadying. Pt unable to perform safe sequence despite cues, wanting to perform reciprocally or lead/lower with incorrect foot per cues.   Pt left up in [facility] with all needs in reach.       Therapy Documentation Precautions:  Precautions Precautions: Fall Precaution Comments: R sided hemiparesis and R knee buckling Restrictions Weight Bearing Restrictions: No Other Position/Activity Restrictions: fall risk    Pain: 2/10 sacrum  See FIM for current functional status  Therapy/Group: Individual Therapy Kennieth Rad, PT, DPT  01/29/2014, 11:27 AM

## 2014-01-29 NOTE — Consult Note (Signed)
  NEUROBEHAVIORAL STATUS EXAM - [facility]   MEDICAL NECESSITY:  Maurice Stone was seen on the [facility] for a neurobehavioral status exam owing to the patient's diagnosis of stroke, and to assist in treatment planning during admission.   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 Patient lives alone and was independent prior to admission. He reportedly presented on 01/15/2014 with frequent falls and right-sided weakness and slurred speech. A brain MRI scan purportedly revealed an acute nonhemorrhagic left paramedian pontine infarct as well as a remote infarct in the left basal ganglia.   During today's visit, Maurice Stone admitted to suffering from memory loss over the past several years, though he did not feel that his recent stroke made it worse. He was unsure why he was prescribed donepezil. No other major cognitive symptoms endorsed. He suffers from right-side upper extremity weakness.   From an emotional standpoint, Maurice Stone described his current mood as "great." He has no history of mental health treatment.  No adjustment issues endorsed. No barriers to therapy identified. Suicidal/homicidal ideation, plan or intent was denied. No manic or hypomanic episodes were reported. The patient denied ever experiencing any auditory/visual hallucinations. No major behavioral or personality changes were endorsed.   In regard to therapy, he has been satisfied with therapy and feels that he is making progress. He has a good social support system that includes friends.   PROCEDURES ADMINISTERED: [2 units [id number] on 01/28/14] Diagnostic clinical interview  Review of available records Montreal Cognitive Assessment  (MoCA)  MENTAL STATUS: Maurice Stone' mental status exam score of 17/30 is below the cutoff used to indicate severe cognitive impairment and dementia. Naming was mildly reduced, as was serial subtraction. Certain aspects of vigilance were below expectation, as was memory. Fluency was markedly impaired. Constructional praxis was adversely impacted by motor deficits.    Emotional & Behavioral Evaluation: Maurice Stone was appropriately dressed for season and situation, and he appeared tidy and well-groomed. Normal posture was noted. He was friendly and rapport was easily established. His speech was as expected and he was able to express ideas effectively. He seemed to understand test directions readily. His affect was appropriately modulated. Attention and motivation were good. Optimal test taking conditions were maintained.    Overall, Maurice Stone appears to be suffering a great deal of cognitive impairment; at the level of dementia. 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 More thorough neuropsychological testing would be valuable in assessing his thinking skills and better determine an etiology and recommend treatment. Data from collateral informants would be very helpful.    RECOMMENDATIONS    Complete more thorough neuropsychological screen with Dr. Beverly Gust.    DIAGNOSES:  Dementia (R/O Alzheimer's disease versus VaD versus other) Cerebral infarction   Maurice Stone, Psy.D.  Clinical Neuropsychologist

## 2014-01-29 NOTE — Progress Notes (Signed)
73 y.o. right handed male with history of hypertension, diabetes mellitus, CAD,COPD as well as CVA 6 years ago with some residual slurred speech maintained on aspirin. Patient lives alone independent prior to admission. Presented 01/15/2014 with frequent falls and right-sided weakness and slurred speech. MRI shows acute nonhemorrhagic left paramedian pontine infarct as well as remote infarct left basal ganglia. MRA of the head without stenosis or occlusion. Echocardiogram with ejection fraction 09% grade 1 diastolic dysfunction. Carotid Dopplers no ICA stenosis. Patient did not receive TPA. Neurology services consulted aspirin increased to 325 mg daily   Subjective/Complaints: No new issues. Denies pain or problems specifically over the weekend.  Review of Systems - Negative except tailbone pain  Objective: Vital Signs: Blood pressure 111/64, pulse 67, temperature 98.1 F (36.7 C), temperature source Oral, resp. rate 18, weight 95.2 kg (209 lb 14.1 oz), SpO2 97.00%. No results found. Results for orders placed during the hospital encounter of 01/18/14 (from the past 72 hour(s))  GLUCOSE, CAPILLARY     Status: Abnormal   Collection Time    01/26/14 10:52 AM      Result Value Ref Range   Glucose-Capillary 158 (*) 70 - 99 mg/dL  GLUCOSE, CAPILLARY     Status: Abnormal   Collection Time    01/26/14 11:41 AM      Result Value Ref Range   Glucose-Capillary 132 (*) 70 - 99 mg/dL  GLUCOSE, CAPILLARY     Status: Abnormal   Collection Time    01/26/14  4:35 PM      Result Value Ref Range   Glucose-Capillary 220 (*) 70 - 99 mg/dL  GLUCOSE, CAPILLARY     Status: Abnormal   Collection Time    01/26/14  8:42 PM      Result Value Ref Range   Glucose-Capillary 168 (*) 70 - 99 mg/dL  GLUCOSE, CAPILLARY     Status: Abnormal   Collection Time    01/27/14  7:35 AM      Result Value Ref Range   Glucose-Capillary 140 (*) 70 - 99 mg/dL  GLUCOSE, CAPILLARY     Status: Abnormal   Collection Time     01/27/14 11:26 AM      Result Value Ref Range   Glucose-Capillary 190 (*) 70 - 99 mg/dL  GLUCOSE, CAPILLARY     Status: Abnormal   Collection Time    01/27/14  4:51 PM      Result Value Ref Range   Glucose-Capillary 182 (*) 70 - 99 mg/dL  GLUCOSE, CAPILLARY     Status: Abnormal   Collection Time    01/27/14  9:07 PM      Result Value Ref Range   Glucose-Capillary 217 (*) 70 - 99 mg/dL  GLUCOSE, CAPILLARY     Status: Abnormal   Collection Time    01/28/14  7:21 AM      Result Value Ref Range   Glucose-Capillary 128 (*) 70 - 99 mg/dL   Comment 1 Notify RN    GLUCOSE, CAPILLARY     Status: Abnormal   Collection Time    01/28/14 11:15 AM      Result Value Ref Range   Glucose-Capillary 166 (*) 70 - 99 mg/dL   Comment 1 Notify RN    GLUCOSE, CAPILLARY     Status: Abnormal   Collection Time    01/28/14  4:28 PM      Result Value Ref Range   Glucose-Capillary 221 (*) 70 - 99 mg/dL  GLUCOSE,  CAPILLARY     Status: Abnormal   Collection Time    01/28/14  8:46 PM      Result Value Ref Range   Glucose-Capillary 180 (*) 70 - 99 mg/dL  GLUCOSE, CAPILLARY     Status: None   Collection Time    01/29/14  7:24 AM      Result Value Ref Range   Glucose-Capillary 94  70 - 99 mg/dL   Comment 1 Notify RN       HEENT: normal Cardio: RRR Resp: CTA B/L GI: BS positive and NT, ND Extremity:  No Edema Skin:   Intact Neuro: Alert/Oriented, Cranial Nerve II-XII normal, Normal Sensory, Normal Motor and Abnormal FMC Ataxic/ dec FMC. Able to engage right hand for self-care, feeding, manipulation. Good sitting balance Musc/Skel:  Other sacral and coccygeal tenderness Gen NAD Mood/affect delayed responses but otherwise approp  Assessment/Plan: 1. Functional deficits secondary to Left pontine infarct which require 3+ hours per day of interdisciplinary therapy in a comprehensive inpatient rehab setting. Physiatrist is providing close team supervision and 24 hour management of active medical  problems listed below. Physiatrist and rehab team continue to assess barriers to discharge/monitor patient progress toward functional and medical goals. FIM: FIM - Bathing Bathing Steps Patient Completed: Chest;Right Arm;Left Arm;Abdomen;Front perineal area;Right upper leg;Left upper leg;Right lower leg (including foot);Left lower leg (including foot);Buttocks Bathing: 5: Supervision: Safety issues/verbal cues  FIM - Upper Body Dressing/Undressing Upper body dressing/undressing steps patient completed: Thread/unthread right sleeve of pullover shirt/dresss;Thread/unthread left sleeve of pullover shirt/dress;Put head through opening of pull over shirt/dress;Pull shirt over trunk Upper body dressing/undressing: 5: Supervision: Safety issues/verbal cues FIM - Lower Body Dressing/Undressing Lower body dressing/undressing steps patient completed: Thread/unthread right underwear leg;Thread/unthread left underwear leg;Pull underwear up/down;Thread/unthread right pants leg;Thread/unthread left pants leg;Pull pants up/down;Fasten/unfasten pants;Don/Doff right sock;Don/Doff left sock;Don/Doff right shoe;Don/Doff left shoe;Fasten/unfasten right shoe;Fasten/unfasten left shoe Lower body dressing/undressing: 4: Steadying Assist  FIM - Toileting Toileting steps completed by patient: Performs perineal hygiene;Adjust clothing after toileting Toileting Assistive Devices: Grab bar or rail for support Toileting: 3: Mod-Patient completed 2 of 3 steps  FIM - Radio producer Devices: Grab bars;Walker Toilet Transfers: 4-From toilet/BSC: Min A (steadying Pt. > 75%);4-To toilet/BSC: Min A (steadying Pt. > 75%)  FIM - Bed/Chair Transfer Bed/Chair Transfer Assistive Devices: Arm rests;Walker Bed/Chair Transfer: 5: Bed > Chair or W/C: Supervision (verbal cues/safety issues)  FIM - Locomotion: Wheelchair Distance: 100 Locomotion: Wheelchair: 2: Travels 49 - 149 ft with supervision, cueing  or coaxing FIM - Locomotion: Ambulation Locomotion: Ambulation Assistive Devices: Administrator Ambulation/Gait Assistance: 4: Min assist Locomotion: Ambulation: 3: Travels 150 ft or more with moderate assistance (Pt: 50 - 74%)  Comprehension Comprehension Mode: Auditory Comprehension: 5-Follows basic conversation/direction: With no assist  Expression Expression Mode: Verbal Expression: 5-Expresses basic needs/ideas: With extra time/assistive device  Social Interaction Social Interaction: 6-Interacts appropriately with others with medication or extra time (anti-anxiety, antidepressant).  Problem Solving Problem Solving: 5-Solves basic problems: With no assist  Memory Memory: 5-Recognizes or recalls 90% of the time/requires cueing < 10% of the time  Medical Problem List and Plan:  1. Functional deficits secondary to acute left paramedian pontine infarct  2. DVT Prophylaxis/Anticoagulation: Subcutaneous heparin. Monitor platelet counts and any signs of bleeding.  3. Pain Management: Clinoril 150 mg twice a day, Neurontin 600 mg 3 times a day, Hydrocodone with increase dose as needed. Monitor with increased mobility , qhs oxycontin until D/C 4. Mood/memory loss/depression: Wellbutrin 100 mg  twice a day. Aricept 10 mg each bedtime, Seroquel 200 mg each bedtime, Zoloft 200 mg daily.  5. Neuropsych: This patient is capable of making decisions on his own behalf.  6. Hypertension. Norvasc 10 mg daily, Cozaar 100 mg daily, Lopressor 50 mg twice a day.bradycardia reduce BB dose  7. Diabetes mellitus with peripheral neuropathy. Hemoglobin A1c 10.6. Lantus insulin 45 units PM and 50 u qam, NovoLog 15 units 3 times a day.---observe cbg's with yesterday's changes 8. Hyperlipidemia. Lipitor  9.  Sacral pain pt reports xrays at [facility], repeat confirms sacrococcygeal jct fx, analgesics/avoiding direct pressure 10.  Constipation- senna LOS (Days) 11 A FACE TO FACE EVALUATION WAS  PERFORMED  Michille Mcelrath T 01/29/2014, 8:27 AM

## 2014-01-30 ENCOUNTER — Encounter (HOSPITAL_COMMUNITY): Payer: Medicare Other | Admitting: Occupational Therapy

## 2014-01-30 ENCOUNTER — Inpatient Hospital Stay (HOSPITAL_COMMUNITY): Payer: Medicare Other

## 2014-01-30 ENCOUNTER — Inpatient Hospital Stay (HOSPITAL_COMMUNITY): Payer: Medicare Other | Admitting: Occupational Therapy

## 2014-01-30 ENCOUNTER — Inpatient Hospital Stay (HOSPITAL_COMMUNITY): Payer: Medicare Other | Admitting: Physical Therapy

## 2014-01-30 DIAGNOSIS — I6330 Cerebral infarction due to thrombosis of unspecified cerebral artery: Secondary | ICD-10-CM

## 2014-01-30 DIAGNOSIS — E119 Type 2 diabetes mellitus without complications: Secondary | ICD-10-CM

## 2014-01-30 DIAGNOSIS — I10 Essential (primary) hypertension: Secondary | ICD-10-CM

## 2014-01-30 LAB — GLUCOSE, CAPILLARY
GLUCOSE-CAPILLARY: 163 mg/dL — AB (ref 70–99)
Glucose-Capillary: 150 mg/dL — ABNORMAL HIGH (ref 70–99)
Glucose-Capillary: 254 mg/dL — ABNORMAL HIGH (ref 70–99)
Glucose-Capillary: 106 mg/dL — ABNORMAL HIGH (ref 70–99)

## 2014-01-30 NOTE — Progress Notes (Signed)
Physical Therapy Session Note  Patient Details  Name: Maurice Stone MRN: [id number] Date of Birth: August 04, 1940  Today's Date: 01/30/2014 Time: 1430-1500 Time Calculation (min): 30 min  Short Term Goals: Week 2:  PT Short Term Goal 1 (Week 2): STG=LTG due to LOS, S overall  Skilled Therapeutic Interventions/Progress Updates:  Neuro Reeducation: PT instructs pt in dynamic standing balance activities with hand held support req min A, progressing to CGA with pt's improved attention to task: mini squats and heel raises 3 x 10 reps, each.   Therapeutic Activity: PT instructs pt in w/c to easy chair transfers req min A, progressing to SBA with repetitions and verbal cues for safest w/c set up and sequencing, x 8 reps.   Pt verbalizes insight into motor control difficulty when not focused on R knee stability. As pt's focus improves, his safety in standing and with transfers improves, as well. Pt continues to require hands on assist for any standing activity and will be safest at w/c level of functional mobility due to prolonged periods of time alone at home upon d/c.      Therapy Documentation Precautions:  Precautions Precautions: Fall Precaution Comments: R sided hemiparesis and R knee buckling Restrictions Weight Bearing Restrictions: No Other Position/Activity Restrictions: fall risk    Pain: Pain Assessment Pain Assessment: Faces Faces Pain Scale: Hurts a little bit Pain Type: Other (Comment) (postural pain) Pain Location: Neck Pain Orientation: Posterior Pain Intervention(s): Repositioned Multiple Pain Sites: No    Locomotion : Wheelchair Mobility Distance: 150   See FIM for current functional status  Therapy/Group: Individual Therapy  Maurice Stone 01/30/2014, 3:12 PM

## 2014-01-30 NOTE — Patient Care Conference (Signed)
Inpatient RehabilitationTeam Conference and Plan of Care Update Date: 01/30/2014   Time: 11;00 AM    Patient Name: Maurice Stone      Medical Record Number: [id number]  Date of Birth: [birth year] Sex: Male         Room/Bed: 4M09C/4M09C-01 Payor Info: Payor: MEDICARE / Plan: MEDICARE PART A / Product Type: *No Product type* /    Admitting Diagnosis: L pons CVA  Admit Date/Time:  01/18/2014  3:02 PM Admission Comments: No comment available   Primary Diagnosis:  <principal problem not specified> Principal Problem: <principal problem not specified>  Patient Active Problem List   Diagnosis Date Noted  . CVA (cerebral infarction) 01/18/2014  . Acute CVA (cerebrovascular accident) 01/17/2014  . CVA (cerebral vascular accident) 01/15/2014  . Essential hypertension, benign 01/15/2014  . Bradycardia 01/15/2014  . chronic Right sided weakness 01/15/2014  . GERD (gastroesophageal reflux disease) 01/15/2014  . Neuropathy 01/15/2014    Expected Discharge Date: Expected Discharge Date: 02/02/14  Team Members Present: Physician leading conference: Dr. Alger Simons Social Worker Present: Ovidio Kin, LCSW Nurse Present: Elliot Cousin, RN PT Present: Georjean Mode, PT;Blair Hobble, PT OT Present: Antony Salmon, OT SLP Present: Windell Moulding, SLP PPS Coordinator present : Ileana Ladd, PT     Current Status/Progress Goal Weekly Team Focus  Medical   improved pain control. motivated. working on glycemic control  pain, dm control before dc  see above, education, safety   Bowel/Bladder   Continent of bowel and bladder  LBM 01/29/2014 per documentation  Remains continent of bowel/bladder  Monitor for constipation   Swallow/Nutrition/ Hydration     na        ADL's   overall supervision, occasional min assist  Supervision - mod I wheelchair level for BADL  Rote practice of safe transitions into / out of wheelchair as needed for BADL, DISCHARGE PLANNING   Mobility   S basic transfers, S  WC mobility, gait with RW min/mod A x150', min A 5 stairs  Mod I basic transfers and Mod I WC level, min A gait x150'  Family training, pt education/safety planning, functional mobility and WC safety   Communication     na        Safety/Cognition/ Behavioral Observations  Mini assist transfer. mod fall risk  No injury / fall with transfers  Able to transfer self without problems   Pain   C/O back pain. Pt has scheduled and PRN pain meds  pain level of 3 or less  Assess pain q4hrs and PRN and intervene accordingly   Skin   No skin breakdown  Skin remains intact  Assess for skin breakdown q shift and PRN      *See Care Plan and progress notes for long and short-term goals.  Barriers to Discharge: safety awareness, memory    Possible Resolutions to Barriers:  improved safety skills,awareness, adaptive equipment, education for caregivers    Discharge Planning/Teaching Needs:  Home with family coming in and out, he does not have 24 hr care and pt feels he will manage at home.  Family education Sat      Team Discussion:  Goals now mod/i w/c level due to safety issues and does not have 24 hr care at discharge. Poor memory, problem solving and safety issues.  Family education on Sat.  Pt feels can manage at home  Revisions to Treatment Plan:  D/C from SP back to baseline function-goals form mod/i w/c level for safety   Continued Need for  Acute Rehabilitation Level of Care: The patient requires daily medical management by a physician with specialized training in physical medicine and rehabilitation for the following conditions: Daily direction of a multidisciplinary physical rehabilitation program to ensure safe treatment while eliciting the highest outcome that is of practical value to the patient.: Yes Daily medical management of patient stability for increased activity during participation in an intensive rehabilitation regime.: Yes Daily analysis of laboratory values and/or radiology  reports with any subsequent need for medication adjustment of medical intervention for : Neurological problems;Post surgical problems  Rj Pedrosa, Gardiner Rhyme 01/31/2014, 9:07 AM

## 2014-01-30 NOTE — Progress Notes (Signed)
73 y.o. right handed male with history of hypertension, diabetes mellitus, CAD,COPD as well as CVA 6 years ago with some residual slurred speech maintained on aspirin. Patient lives alone independent prior to admission. Presented 01/15/2014 with frequent falls and right-sided weakness and slurred speech. MRI shows acute nonhemorrhagic left paramedian pontine infarct as well as remote infarct left basal ganglia. MRA of the head without stenosis or occlusion. Echocardiogram with ejection fraction 67% grade 1 diastolic dysfunction. Carotid Dopplers no ICA stenosis. Patient did not receive TPA. Neurology services consulted aspirin increased to 325 mg daily   Subjective/Complaints: Feeling well. Getting stronger, encouraged by progress.   Review of Systems - Negative except tailbone pain  Objective: Vital Signs: Blood pressure 124/67, pulse 51, temperature 97.8 F (36.6 C), temperature source Oral, resp. rate 18, weight 98 kg (216 lb 0.8 oz), SpO2 94.00%. No results found. Results for orders placed during the hospital encounter of 01/18/14 (from the past 72 hour(s))  GLUCOSE, CAPILLARY     Status: Abnormal   Collection Time    01/27/14 11:26 AM      Result Value Ref Range   Glucose-Capillary 190 (*) 70 - 99 mg/dL  GLUCOSE, CAPILLARY     Status: Abnormal   Collection Time    01/27/14  4:51 PM      Result Value Ref Range   Glucose-Capillary 182 (*) 70 - 99 mg/dL  GLUCOSE, CAPILLARY     Status: Abnormal   Collection Time    01/27/14  9:07 PM      Result Value Ref Range   Glucose-Capillary 217 (*) 70 - 99 mg/dL  GLUCOSE, CAPILLARY     Status: Abnormal   Collection Time    01/28/14  7:21 AM      Result Value Ref Range   Glucose-Capillary 128 (*) 70 - 99 mg/dL   Comment 1 Notify RN    GLUCOSE, CAPILLARY     Status: Abnormal   Collection Time    01/28/14 11:15 AM      Result Value Ref Range   Glucose-Capillary 166 (*) 70 - 99 mg/dL   Comment 1 Notify RN    GLUCOSE, CAPILLARY     Status:  Abnormal   Collection Time    01/28/14  4:28 PM      Result Value Ref Range   Glucose-Capillary 221 (*) 70 - 99 mg/dL  GLUCOSE, CAPILLARY     Status: Abnormal   Collection Time    01/28/14  8:46 PM      Result Value Ref Range   Glucose-Capillary 180 (*) 70 - 99 mg/dL  GLUCOSE, CAPILLARY     Status: None   Collection Time    01/29/14  7:24 AM      Result Value Ref Range   Glucose-Capillary 94  70 - 99 mg/dL   Comment 1 Notify RN    GLUCOSE, CAPILLARY     Status: None   Collection Time    01/29/14 11:43 AM      Result Value Ref Range   Glucose-Capillary 87  70 - 99 mg/dL   Comment 1 Notify RN    GLUCOSE, CAPILLARY     Status: Abnormal   Collection Time    01/29/14  4:57 PM      Result Value Ref Range   Glucose-Capillary 205 (*) 70 - 99 mg/dL  GLUCOSE, CAPILLARY     Status: Abnormal   Collection Time    01/29/14  8:44 PM      Result  Value Ref Range   Glucose-Capillary 169 (*) 70 - 99 mg/dL  GLUCOSE, CAPILLARY     Status: Abnormal   Collection Time    01/30/14  7:27 AM      Result Value Ref Range   Glucose-Capillary 163 (*) 70 - 99 mg/dL   Comment 1 Notify RN       HEENT: normal Cardio: RRR Resp: CTA B/L GI: BS positive and NT, ND Extremity:  No Edema Skin:   Intact Neuro: Alert/Oriented, Cranial Nerve II-XII normal, Normal Sensory, Normal Motor and Abnormal FMC Ataxic/ dec FMC. Able to engage right hand for self-care, feeding, manipulation. Good sitting balance Musc/Skel:  Other sacral and coccygeal tenderness Gen NAD Mood/affect delayed responses but otherwise approp  Assessment/Plan: 1. Functional deficits secondary to Left pontine infarct which require 3+ hours per day of interdisciplinary therapy in a comprehensive inpatient rehab setting. Physiatrist is providing close team supervision and 24 hour management of active medical problems listed below. Physiatrist and rehab team continue to assess barriers to discharge/monitor patient progress toward functional and  medical goals. FIM: FIM - Bathing Bathing Steps Patient Completed: Chest;Right Arm;Left Arm;Abdomen;Front perineal area;Right upper leg;Left upper leg;Right lower leg (including foot);Left lower leg (including foot);Buttocks Bathing: 5: Supervision: Safety issues/verbal cues  FIM - Upper Body Dressing/Undressing Upper body dressing/undressing steps patient completed: Thread/unthread right sleeve of pullover shirt/dresss;Thread/unthread left sleeve of pullover shirt/dress;Put head through opening of pull over shirt/dress;Pull shirt over trunk Upper body dressing/undressing: 5: Supervision: Safety issues/verbal cues FIM - Lower Body Dressing/Undressing Lower body dressing/undressing steps patient completed: Thread/unthread right underwear leg;Thread/unthread left underwear leg;Pull underwear up/down;Thread/unthread right pants leg;Thread/unthread left pants leg;Pull pants up/down;Fasten/unfasten pants;Don/Doff right sock;Don/Doff left sock;Don/Doff right shoe;Don/Doff left shoe;Fasten/unfasten right shoe;Fasten/unfasten left shoe Lower body dressing/undressing: 5: Supervision: Safety issues/verbal cues  FIM - Toileting Toileting steps completed by patient: Performs perineal hygiene;Adjust clothing after toileting Toileting Assistive Devices: Grab bar or rail for support Toileting: 3: Mod-Patient completed 2 of 3 steps  FIM - Radio producer Devices: Grab bars;Walker Toilet Transfers: 4-From toilet/BSC: Min A (steadying Pt. > 75%);4-To toilet/BSC: Min A (steadying Pt. > 75%)  FIM - Bed/Chair Transfer Bed/Chair Transfer Assistive Devices: Arm rests Bed/Chair Transfer: 6: Supine > Sit: No assist;6: Sit > Supine: No assist;5: Bed > Chair or W/C: Supervision (verbal cues/safety issues);5: Chair or W/C > Bed: Supervision (verbal cues/safety issues)  FIM - Locomotion: Wheelchair Distance: 150 Locomotion: Wheelchair: 5: Travels 150 ft or more: maneuvers on rugs and over  door sills with supervision, cueing or coaxing FIM - Locomotion: Ambulation Locomotion: Ambulation Assistive Devices: Other (comment) (HHA) Ambulation/Gait Assistance: 1: +2 Total assist Locomotion: Ambulation: 1: Two helpers  Comprehension Comprehension Mode: Auditory Comprehension: 5-Follows basic conversation/direction: With extra time/assistive device  Expression Expression Mode: Verbal Expression: 5-Expresses basic needs/ideas: With extra time/assistive device  Social Interaction Social Interaction: 6-Interacts appropriately with others with medication or extra time (anti-anxiety, antidepressant).  Problem Solving Problem Solving: 5-Solves basic 90% of the time/requires cueing < 10% of the time  Memory Memory: 4-Recognizes or recalls 75 - 89% of the time/requires cueing 10 - 24% of the time  Medical Problem List and Plan:  1. Functional deficits secondary to acute left paramedian pontine infarct  2. DVT Prophylaxis/Anticoagulation: Subcutaneous heparin. Monitor platelet counts and any signs of bleeding.  3. Pain Management: Clinoril 150 mg twice a day, Neurontin 600 mg 3 times a day, Hydrocodone        - qhs oxycontin until D/C? 4. Mood/memory loss/depression:  Wellbutrin 100 mg twice a day. Aricept 10 mg each bedtime, Seroquel 200 mg each bedtime, Zoloft 200 mg daily.  5. Neuropsych: This patient is capable of making decisions on his own behalf.  6. Hypertension. Norvasc 10 mg daily, Cozaar 100 mg daily, Lopressor 50 mg twice a day.bradycardia reduce BB dose  7. Diabetes mellitus with peripheral neuropathy. Hemoglobin A1c 10.6. Lantus insulin 45 units PM and 50 u qam, NovoLog 15 units 3 times a day.---observe cbg's again today. Consider increasing am lantus further 8. Hyperlipidemia. Lipitor  9.  Sacral pain pt reports xrays at [facility], repeat confirms sacrococcygeal jct fx, analgesics/avoiding direct pressure 10.  Constipation- senna  LOS (Days) 12 A FACE TO FACE EVALUATION WAS  PERFORMED  Takiah Maiden T 01/30/2014, 8:45 AM

## 2014-01-30 NOTE — Progress Notes (Signed)
Physical Therapy Note  Patient Details  Name: Maurice Stone MRN: [id number] Date of Birth: [birth year] Today's Date: 01/30/2014 1601-0932, 60 min individual therapy; no pain reported  W/c propulsion using bil UEs, and /or bil UEs x 150' x 2 with 1 cue for safety of R hand as he passed through a doorway.  Simulated car transfer at SUV seat ht with supervision, safely, stand pivot.  neuromuscular re-education via forced use, demo, VCs for:  Self stretching bil heel cords and hamstrings in standing, eccentric control LLE descending 3 x 2 steps, min guard assist > max assist for LOB backwards and RLE timing and sequencing when ascending. R hand fine motor activity in unsupported sitting, manipulating small plastic spoons in/out of bowl,slowly with 100% success with self -correction.  Therapeutic activity for standing dynamic balance- standing then retrieving cones from floor with R and L hands, x 6 each, and placing on table, without LOB.  Throwing 7  horseshoes with R hand in standing to target 13' away, with fair accuracy.  Pt c/o low back pain when standing for activity, and R knee buckled requiring max assist to prevent fall. W/c>< mat stand pivot transfers to L and R without cues, supervision.Pt is able to verbalize that he does not feel R knee buckling immediately.   Pt also stated that prior to recent CVA, he fell at home about once per month, especially when transporting a bulky object with bil hands.  Returned to room and left with all needs within reach.  Coleston Dirosa 01/30/2014, 11:55 AM

## 2014-01-30 NOTE — Progress Notes (Addendum)
NUTRITION FOLLOW UP  Intervention:   - Disontinue Glucerna shake daily  - Encouraged continued excellent intake - RD to continue to monitor   Nutrition Dx:   Unintentional wt loss related to inadequate oral intake as evidenced by 17 lbs unintentional wt loss - discontinued    Goal:   Pt to meet >/= 90% of their estimated nutrition needs - met   Monitor:   Weights, labs, intake  Assessment:   Maurice Stone is a 73 y.o. right handed male with history of hypertension, diabetes mellitus, CAD,COPD as well as CVA 6 years ago with some residual slurred speech maintained on aspirin. Patient lives alone independent prior to admission. Presented 01/15/2014 with frequent falls and right-sided weakness and slurred speech   6/27: -Pt endorsed a 17 lbs unintentional wt loss over past one year (7.7% body weight loss, non-severe for time frame)  -Diet recall indicates pt consuming eggs/toast/coffee for breakfast, skipping lunch, and canned soups for dinner  -Denied nausea/vomiting/abd pain that decreased appetite, pt noted he has minimal interest in cooking lunch meals  -Encouraged pt to implement a Glucerna/Boost Carb Control once daily regimen as pt skipping meals/nutrients has likely contributed to unintentional wt loss. Also noted importance of nutrition for rehab/recovery  -Was willing to begin supplement during admit and comply once stable to d/c  -Denied dysphagia or need for diet texture modifications. Current PO intake excellent at 100%   7/1: -Met with pt who reports eating 100% of meals and drinking 100% of Glucerna shakes -No nutritional concerns at this time  7/8: Pt continues to eat 100% of almost all meals and drink one Glucerna Shake daily. Pt's weight has trended up 19 lbs within the past 2 weeks. No edema. Will d/c Glucerna as pt is eating well and gaining weight.   Labs: Glucose ranging 87 to 205 mg/dL  Height: Ht Readings from Last 1 Encounters:  01/17/14 '5\' 10"'  (1.778  m)    Weight Status:   Wt Readings from Last 1 Encounters:  01/30/14 216 lb 0.8 oz (98 kg)  01/17/14 197 lb  Re-estimated needs:  Kcal: 1800-2000  Protein: 90-100 gram  Fluid: >/=1800 ml/daily   Skin: Intact  Diet Order: Carb Control   Intake/Output Summary (Last 24 hours) at 01/30/14 1350 Last data filed at 01/30/14 1300  Gross per 24 hour  Intake   1080 ml  Output    902 ml  Net    178 ml    Last BM: 7/4   Labs:  No results found for this basename: NA, K, CL, CO2, BUN, CREATININE, CALCIUM, MG, PHOS, GLUCOSE,  in the last 168 hours  CBG (last 3)   Recent Labs  01/29/14 2044 01/30/14 0727 01/30/14 1143  GLUCAP 169* 163* 150*    Scheduled Meds: . amLODipine  10 mg Oral Daily  . antiseptic oral rinse  15 mL Mouth Rinse BID  . atorvastatin  20 mg Oral q1800  . buPROPion  100 mg Oral BID  . clopidogrel  75 mg Oral Daily  . donepezil  10 mg Oral QHS  . feeding supplement (GLUCERNA SHAKE)  237 mL Oral Q24H  . fenofibrate  160 mg Oral Daily  . gabapentin  600 mg Oral TID  . heparin  5,000 Units Subcutaneous 3 times per day  . insulin aspart  0-15 Units Subcutaneous TID WC  . insulin aspart  15 Units Subcutaneous TID WC  . insulin glargine  45 Units Subcutaneous QHS  . insulin glargine  50 Units Subcutaneous Daily  . loratadine  10 mg Oral Daily  . losartan  100 mg Oral Daily  . metoprolol  25 mg Oral BID  . OxyCODONE  10 mg Oral QHS  . pantoprazole  40 mg Oral Daily  . QUEtiapine  200 mg Oral QHS  . senna-docusate  2 tablet Oral BID  . sertraline  200 mg Oral Daily  . sulindac  150 mg Oral BID WC  . tamsulosin  0.4 mg Oral Daily   Pryor Ochoa RD, LDN Inpatient Clinical Dietitian Pager: [phone] After Hours Pager: [phone]

## 2014-01-30 NOTE — Progress Notes (Signed)
Recreational Therapy Discharge Summary Patient Details  Name: Maurice Stone MRN: [id number] Date of Birth: [birth year] Today's Date: 01/30/2014  Long term goals set: 1  Long term goals met: 1  Comments on progress toward goals: Pt has made good progress toward goal and met set up/supervision level for simple TR tasks seated w/c level.  Discussion/eduction with pt about adaptations for desired activities in which pt stated understanding.  Pt is scheduled for discharge home 6/11 with intermittent supervision from family. Reasons for discharge: discharge from hospital  Patient/family agrees with progress made and goals achieved: Yes  Yvone Slape 01/30/2014, 5:32 PM

## 2014-01-30 NOTE — Progress Notes (Signed)
Occupational Therapy Session Note  Patient Details  Name: LAVON BOTHWELL MRN: [id number] Date of Birth: [birth year]  Today's Date: 01/30/2014 Time: 1400-1430 Time Calculation (min): 30 min  Skilled Therapeutic Interventions/Progress Updates:    Patient seen this pm for OT intervention to address safety with level surface transfers.  Patient demonstrated best performance of squat pivot transfers with swing away arm rests.  Discussed this with both PT and Education officer, museum for equipment ordering.  Patient able to safely demonstrate swing away feature after multiple repetitions within this session.  Patient will benefit from further rote repetition of this aspect of the transfer.  Min cueing for set up of wheelchair for safest transfer to bed, shower bench.    Therapy Documentation Precautions:  Precautions Precautions: Fall Precaution Comments: R sided hemiparesis and R knee buckling Restrictions Weight Bearing Restrictions: No Other Position/Activity Restrictions: fall risk  Pain:   No report of pain :    See FIM for current functional status  Therapy/Group: Individual Therapy  Mariah Milling 01/30/2014, 3:14 PM

## 2014-01-30 NOTE — Progress Notes (Signed)
Occupational Therapy Session Note  Patient Details  Name: Maurice Stone MRN: [id number] Date of Birth: April 27, 1941  Today's Date: 01/30/2014 Time: 0903-1003 Time Calculation (min): 60 min  Short Term Goals: Week 2:  OT Short Term Goal 1 (Week 2): STG=Revised LTGs of overall Mod I at a w/c level and Supervision with shower transfers  Skilled Therapeutic Interventions/Progress Updates:    Pt began session with toilet transfer from wheelchair to elevated toilet using grab bars.  He needed mod instructional cueing for wheelchair setup to make the most efficient transfer as he initially attempted to place wheelchair directly in front of the commode and perform 180 degree turn with little room for his LEs.  Once therapist cued him to position wheelchair differently he was able to perform all aspects of toileting including the transfer with supervision.  Once finished he gathered his clothing and bathing items and therapist rolled him to the tub/shower room for bathing.  Mod instructional cueing again for wheelchair setup to perform transfer squat pivot to the bench.  Pt overall supervision for bathing but needed mod instructional cueing for not attempting to stand with his feet soaped up and for rinsing himself thoroughly.  Pt completed bathing with supervision and then transferred out to the wheelchair with supervision as well.  He continues to need cueing for wheelchair setup to transfer efficiently as he tends to turn to the opposite side making the transfer greater than 180 degrees.  Dressing sit to stand with increased time and close supervision except for TEDs which required min assist.    Therapy Documentation Precautions:  Precautions Precautions: Fall Precaution Comments: R sided hemiparesis and R knee buckling Restrictions Weight Bearing Restrictions: No Other Position/Activity Restrictions: fall risk  Pain: Pain Assessment Pain Assessment: No/denies pain ADL: See FIM for current  functional status  Therapy/Group: Individual Therapy  Maurice Stone OTR/L 01/30/2014, 10:54 AM

## 2014-01-31 ENCOUNTER — Inpatient Hospital Stay (HOSPITAL_COMMUNITY): Payer: Medicare Other

## 2014-01-31 ENCOUNTER — Inpatient Hospital Stay (HOSPITAL_COMMUNITY): Payer: Medicare Other | Admitting: Occupational Therapy

## 2014-01-31 LAB — GLUCOSE, CAPILLARY
GLUCOSE-CAPILLARY: 137 mg/dL — AB (ref 70–99)
Glucose-Capillary: 82 mg/dL (ref 70–99)
Glucose-Capillary: 107 mg/dL — ABNORMAL HIGH (ref 70–99)
Glucose-Capillary: 110 mg/dL — ABNORMAL HIGH (ref 70–99)

## 2014-01-31 MED ORDER — WHITE PETROLATUM GEL
Status: AC
  Administered 2014-01-31: 1
  Filled 2014-01-31: qty 5

## 2014-01-31 MED ORDER — INSULIN ASPART 100 UNIT/ML ~~LOC~~ SOLN
17.0000 [IU] | Freq: Three times a day (TID) | SUBCUTANEOUS | Status: DC
  Administered 2014-01-31 – 2014-02-02 (×6): 17 [IU] via SUBCUTANEOUS

## 2014-01-31 NOTE — Progress Notes (Signed)
73 y.o. right handed male with history of hypertension, diabetes mellitus, CAD,COPD as well as CVA 6 years ago with some residual slurred speech maintained on aspirin. Patient lives alone independent prior to admission. Presented 01/15/2014 with frequent falls and right-sided weakness and slurred speech. MRI shows acute nonhemorrhagic left paramedian pontine infarct as well as remote infarct left basal ganglia. MRA of the head without stenosis or occlusion. Echocardiogram with ejection fraction 81% grade 1 diastolic dysfunction. Carotid Dopplers no ICA stenosis. Patient did not receive TPA. Neurology services consulted aspirin increased to 325 mg daily   Subjective/Complaints: No new complaints. Improved appetite.    Review of Systems - Negative except tailbone pain  Objective: Vital Signs: Blood pressure 110/65, pulse 60, temperature 97.7 F (36.5 C), temperature source Oral, resp. rate 19, weight 98 kg (216 lb 0.8 oz), SpO2 97.00%. No results found. Results for orders placed during the hospital encounter of 01/18/14 (from the past 72 hour(s))  GLUCOSE, CAPILLARY     Status: Abnormal   Collection Time    01/28/14 11:15 AM      Result Value Ref Range   Glucose-Capillary 166 (*) 70 - 99 mg/dL   Comment 1 Notify RN    GLUCOSE, CAPILLARY     Status: Abnormal   Collection Time    01/28/14  4:28 PM      Result Value Ref Range   Glucose-Capillary 221 (*) 70 - 99 mg/dL  GLUCOSE, CAPILLARY     Status: Abnormal   Collection Time    01/28/14  8:46 PM      Result Value Ref Range   Glucose-Capillary 180 (*) 70 - 99 mg/dL  GLUCOSE, CAPILLARY     Status: None   Collection Time    01/29/14  7:24 AM      Result Value Ref Range   Glucose-Capillary 94  70 - 99 mg/dL   Comment 1 Notify RN    GLUCOSE, CAPILLARY     Status: None   Collection Time    01/29/14 11:43 AM      Result Value Ref Range   Glucose-Capillary 87  70 - 99 mg/dL   Comment 1 Notify RN    GLUCOSE, CAPILLARY     Status: Abnormal    Collection Time    01/29/14  4:57 PM      Result Value Ref Range   Glucose-Capillary 205 (*) 70 - 99 mg/dL  GLUCOSE, CAPILLARY     Status: Abnormal   Collection Time    01/29/14  8:44 PM      Result Value Ref Range   Glucose-Capillary 169 (*) 70 - 99 mg/dL  GLUCOSE, CAPILLARY     Status: Abnormal   Collection Time    01/30/14  7:27 AM      Result Value Ref Range   Glucose-Capillary 163 (*) 70 - 99 mg/dL   Comment 1 Notify RN    GLUCOSE, CAPILLARY     Status: Abnormal   Collection Time    01/30/14 11:43 AM      Result Value Ref Range   Glucose-Capillary 150 (*) 70 - 99 mg/dL   Comment 1 Notify RN    GLUCOSE, CAPILLARY     Status: Abnormal   Collection Time    01/30/14  4:53 PM      Result Value Ref Range   Glucose-Capillary 254 (*) 70 - 99 mg/dL  GLUCOSE, CAPILLARY     Status: Abnormal   Collection Time    01/30/14  9:34  PM      Result Value Ref Range   Glucose-Capillary 106 (*) 70 - 99 mg/dL  GLUCOSE, CAPILLARY     Status: None   Collection Time    01/31/14  7:22 AM      Result Value Ref Range   Glucose-Capillary 82  70 - 99 mg/dL   Comment 1 Notify RN       HEENT: normal Cardio: RRR Resp: CTA B/L GI: BS positive and NT, ND Extremity:  No Edema Skin:   Intact Neuro: Alert/Oriented, Cranial Nerve II-XII normal, Normal Sensory, Normal Motor and Abnormal FMC Ataxic/ dec FMC. Able to engage right hand for self-care, feeding, manipulation. Fair insight Musc/Skel:  Other sacral and coccygeal tenderness Gen NAD Mood/affect delayed responses but otherwise approp  Assessment/Plan: 1. Functional deficits secondary to Left pontine infarct which require 3+ hours per day of interdisciplinary therapy in a comprehensive inpatient rehab setting. Physiatrist is providing close team supervision and 24 hour management of active medical problems listed below. Physiatrist and rehab team continue to assess barriers to discharge/monitor patient progress toward functional and medical  goals. FIM: FIM - Bathing Bathing Steps Patient Completed: Chest;Right Arm;Left Arm;Abdomen;Front perineal area;Right upper leg;Left upper leg;Right lower leg (including foot);Left lower leg (including foot);Buttocks Bathing: 5: Supervision: Safety issues/verbal cues  FIM - Upper Body Dressing/Undressing Upper body dressing/undressing steps patient completed: Thread/unthread right sleeve of pullover shirt/dresss;Thread/unthread left sleeve of pullover shirt/dress;Put head through opening of pull over shirt/dress;Pull shirt over trunk Upper body dressing/undressing: 5: Supervision: Safety issues/verbal cues FIM - Lower Body Dressing/Undressing Lower body dressing/undressing steps patient completed: Thread/unthread right underwear leg;Thread/unthread left underwear leg;Pull underwear up/down;Thread/unthread right pants leg;Thread/unthread left pants leg;Pull pants up/down;Fasten/unfasten pants;Don/Doff right sock;Don/Doff left sock;Don/Doff right shoe;Don/Doff left shoe;Fasten/unfasten right shoe;Fasten/unfasten left shoe Lower body dressing/undressing: 5: Supervision: Safety issues/verbal cues  FIM - Toileting Toileting steps completed by patient: Adjust clothing after toileting;Adjust clothing prior to toileting Toileting Assistive Devices: Grab bar or rail for support Toileting: 5: Supervision: Safety issues/verbal cues  FIM - Radio producer Devices: Grab bars;Walker Toilet Transfers: 5-To toilet/BSC: Supervision (verbal cues/safety issues);5-From toilet/BSC: Supervision (verbal cues/safety issues)  FIM - Engineer, site Assistive Devices: Arm rests Bed/Chair Transfer: 5: Chair or W/C > Bed: Supervision (verbal cues/safety issues)  FIM - Locomotion: Wheelchair Distance: 150 Locomotion: Wheelchair: 5: Travels 150 ft or more: maneuvers on rugs and over door sills with supervision, cueing or coaxing FIM - Locomotion:  Ambulation Locomotion: Ambulation Assistive Devices: Other (comment) (HHA) Ambulation/Gait Assistance: 1: +2 Total assist Locomotion: Ambulation: 0: Activity did not occur  Comprehension Comprehension Mode: Auditory Comprehension: 5-Follows basic conversation/direction: With extra time/assistive device  Expression Expression Mode: Verbal Expression: 5-Expresses basic needs/ideas: With extra time/assistive device  Social Interaction Social Interaction: 6-Interacts appropriately with others with medication or extra time (anti-anxiety, antidepressant).  Problem Solving Problem Solving: 5-Solves basic 90% of the time/requires cueing < 10% of the time  Memory Memory: 4-Recognizes or recalls 75 - 89% of the time/requires cueing 10 - 24% of the time  Medical Problem List and Plan:  1. Functional deficits secondary to acute left paramedian pontine infarct  2. DVT Prophylaxis/Anticoagulation: Subcutaneous heparin. Monitor platelet counts and any signs of bleeding.  3. Pain Management: Clinoril 150 mg twice a day, Neurontin 600 mg 3 times a day, Hydrocodone        - qhs oxycontin until D/C? 4. Mood/memory loss/depression: Wellbutrin 100 mg twice a day. Aricept 10 mg each bedtime, Seroquel 200 mg each  bedtime, Zoloft 200 mg daily.  5. Neuropsych: This patient is capable of making decisions on his own behalf.  6. Hypertension. Norvasc 10 mg daily, Cozaar 100 mg daily, Lopressor 50 mg twice a day.bradycardia reduce BB dose  7. Diabetes mellitus with peripheral neuropathy. Hemoglobin A1c 10.6. Lantus insulin 45 units PM and 50 u qam, NovoLog 15 units 3 times a day.---observe cbg's again today. Consider increasing am lantus further  -will increase mealtime covg to 17u 8. Hyperlipidemia. Lipitor  9.  Sacral pain pt reports xrays at [facility], repeat confirms sacrococcygeal jct fx, analgesics/avoiding direct pressure 10.  Constipation- senna  LOS (Days) 13 A FACE TO FACE EVALUATION WAS  PERFORMED  Layia Walla T 01/31/2014, 8:58 AM

## 2014-01-31 NOTE — Progress Notes (Signed)
Occupational Therapy Session Notes  Patient Details  Name: Maurice Stone MRN: [id number] Date of Birth: 20-Apr-1941  Today's Date: 01/31/2014 Time: 0900-1000 and 137-237 Time Calculation (min): 60 min and 60 min  Short Term Goals: Week 2:  OT Short Term Goal 1 (Week 2): STG=Revised LTGs of overall Mod I at a w/c level and Supervision with shower transfers  Skilled Therapeutic Interventions/Progress Updates:  1)  Patient resting in w/c upon arrival.  Engaged in self care retraining to include shower, dress and groom.  Focused session on activity tolerance, memory regarding routine he/I have established while on rehab, safe transfers, safe transitional movements, RUE coordination, RUE & RLE forced use, set up patient's hospital room to improve safety with w/c mobility.    2)  Patient resting in w/c upon arrival. Focused session on review and return demonstrate multiple times w/c mobility in his hospital room and in the bathroom, safe placement of the w/c for commode and bed transfers, transfer techniques and toileting.  Patient requires occasional vcs for these tasks.  Reviewed his bathroom setup at home to include drew picture to better understand and instruct patient on safe w/c placement and transfers w/c><toilet/tub bench.  Picture was placed on board above his bed to refer to later and for family education on Saturday before discharge.  Therapy Documentation Precautions:  Precautions Precautions: Fall Precaution Comments: R sided hemiparesis and R knee buckling Restrictions Weight Bearing Restrictions: No Other Position/Activity Restrictions: fall risk Pain: No report of pain in either session. ADL: See FIM for current functional status  Therapy/Group: Individual Therapy both sessions  [place], [place] 01/31/2014, 11:00 AM

## 2014-01-31 NOTE — Progress Notes (Signed)
Physical Therapy Note  Patient Details  Name: Maurice Stone MRN: [id number] Date of Birth: 03/12/1941 Today's Date: 01/31/2014 1005-1105, 60 min individual therapy  W/c propulsion x 150' modified independent, without use of legrests, using bil UEs and /or bil LEs.  W/c> NuStep and mat to L and R with extra time, distant supervision.    neuromuscular re-education via forced use, VCs for RLE coordination and stance stability on NuStep at level 4 x 8 minutes; gait without AD x 100' with min> mod assist due to LOB resulting in scissoring RLE; dynamic standing balance activity requiring squatting, reaching to R and rotating trunk to L in standing, x 12 without LOB.  Pt self corrected foot position during standing reaching activity.  Floor transfer x 2 with VCs, pt preferring to approach sideways once on quadruped.  Nisreen Guise 01/31/2014, 7:57 AM

## 2014-01-31 NOTE — Progress Notes (Signed)
Social Work [name], LCSW Social Worker Signed  Patient Care Conference Service date: 01/30/2014 1:43 PM  Inpatient RehabilitationTeam Conference and Plan of Care Update Date: 01/30/2014   Time: 11;00 AM     Patient Name: Maurice Stone       Medical Record Number: [id number]   Date of Birth: 12-Sep-1940 Sex: Male         Room/Bed: 4M09C/4M09C-01 Payor Info: Payor: MEDICARE / Plan: MEDICARE PART A / Product Type: *No Product type* /   Admitting Diagnosis: L pons CVA   Admit Date/Time:  01/18/2014  3:02 PM Admission Comments: No comment available   Primary Diagnosis:  <principal problem not specified> Principal Problem: <principal problem not specified>    Patient Active Problem List     Diagnosis  Date Noted   .  CVA (cerebral infarction)  01/18/2014   .  Acute CVA (cerebrovascular accident)  01/17/2014   .  CVA (cerebral vascular accident)  01/15/2014   .  Essential hypertension, benign  01/15/2014   .  Bradycardia  01/15/2014   .  chronic Right sided weakness  01/15/2014   .  GERD (gastroesophageal reflux disease)  01/15/2014   .  Neuropathy  01/15/2014     Expected Discharge Date: Expected Discharge Date: 02/02/14  Team Members Present: Physician leading conference: Dr. Alger Simons Social Worker Present: Ovidio Kin, LCSW Nurse Present: Elliot Cousin, RN PT Present: Georjean Mode, PT;Blair Hobble, PT OT Present: Antony Salmon, OT SLP Present: Windell Moulding, SLP PPS Coordinator present : Ileana Ladd, PT        Current Status/Progress  Goal  Weekly Team Focus   Medical     improved pain control. motivated. working on glycemic control  pain, dm control before dc  see above, education, safety   Bowel/Bladder     Continent of bowel and bladder  LBM 01/29/2014 per documentation  Remains continent of bowel/bladder  Monitor for constipation   Swallow/Nutrition/ Hydration     na       ADL's     overall supervision, occasional min assist  Supervision - mod I  wheelchair level for BADL  Rote practice of safe transitions into / out of wheelchair as needed for BADL, DISCHARGE PLANNING   Mobility     S basic transfers, S WC mobility, gait with RW min/mod A x150', min A 5 stairs  Mod I basic transfers and Mod I WC level, min A gait x150'  Family training, pt education/safety planning, functional mobility and WC safety   Communication     na       Safety/Cognition/ Behavioral Observations    Mini assist transfer. mod fall risk  No injury / fall with transfers  Able to transfer self without problems   Pain     C/O back pain. Pt has scheduled and PRN pain meds  pain level of 3 or less  Assess pain q4hrs and PRN and intervene accordingly   Skin     No skin breakdown  Skin remains intact  Assess for skin breakdown q shift and PRN     *See Care Plan and progress notes for long and short-term goals.    Barriers to Discharge:  safety awareness, memory      Possible Resolutions to Barriers:    improved safety skills,awareness, adaptive equipment, education for caregivers      Discharge Planning/Teaching Needs:    Home with family coming in and out, he does not have 24 hr care and  pt feels he will manage at home.  Family education Sat      Team Discussion:    Goals now mod/i w/c level due to safety issues and does not have 24 hr care at discharge. Poor memory, problem solving and safety issues.  Family education on Sat.  Pt feels can manage at home   Revisions to Treatment Plan:    D/C from SP back to baseline function-goals form mod/i w/c level for safety    Continued Need for Acute Rehabilitation Level of Care: The patient requires daily medical management by a physician with specialized training in physical medicine and rehabilitation for the following conditions: Daily direction of a multidisciplinary physical rehabilitation program to ensure safe treatment while eliciting the highest outcome that is of practical value to the patient.: Yes Daily  medical management of patient stability for increased activity during participation in an intensive rehabilitation regime.: Yes Daily analysis of laboratory values and/or radiology reports with any subsequent need for medication adjustment of medical intervention for : Neurological problems;Post surgical problems  [name], Gardiner Rhyme 01/31/2014, 9:07 AM         [name], LCSW Social Worker Signed  Patient Care Conference Service date: 01/24/2014 9:46 AM  Inpatient RehabilitationTeam Conference and Plan of Care Update Date: 01/23/2014   Time: 10:30 AM     Patient Name: Maurice Stone       Medical Record Number: [id number]   Date of Birth: 11/23/1940 Sex: Male         Room/Bed: 4M09C/4M09C-01 Payor Info: Payor: MEDICARE / Plan: MEDICARE PART A / Product Type: *No Product type* /   Admitting Diagnosis: L pons CVA   Admit Date/Time:  01/18/2014  3:02 PM Admission Comments: No comment available   Primary Diagnosis:  <principal problem not specified> Principal Problem: <principal problem not specified>    Patient Active Problem List     Diagnosis  Date Noted   .  CVA (cerebral infarction)  01/18/2014   .  Acute CVA (cerebrovascular accident)  01/17/2014   .  CVA (cerebral vascular accident)  01/15/2014   .  Essential hypertension, benign  01/15/2014   .  Bradycardia  01/15/2014   .  chronic Right sided weakness  01/15/2014   .  GERD (gastroesophageal reflux disease)  01/15/2014   .  Neuropathy  01/15/2014     Expected Discharge Date: Expected Discharge Date: 02/02/14  Team Members Present: Physician leading conference: Dr. Alysia Penna Social Worker Present: Ovidio Kin, LCSW Nurse Present: Dorien Chihuahua, RN PT Present: Georjean Mode, PT;Blair Hobble, PT OT Present: Antony Salmon, OT SLP Present: Other (comment) Elmyra Ricks page-SP) PPS Coordinator present : Ileana Ladd, Lelan Pons, RN, CRRN        Current Status/Progress  Goal  Weekly Team Focus   Medical      coccyx pain  improve pain to allow PT participation  adjust meds, improve pt compliance   Bowel/Bladder     Patient is continent of bowel and bladder. LBM: 01/21/2014  Patient to remain continent of B&B  Monitor for constipation/diarrhea   Swallow/Nutrition/ Hydration     regular and thin   n/a  n/a   ADL's     Overall min-mod  Overall Supervision/cueing with BADLs  activity tolerance, static and dynamic balance, sustained muscle activity, gross and fine motor coordination, intellectual awareness, memory and discharge planning.   Mobility     mod assist transfer, supervision w/c x 150', pre-gait  supervision overall including 150' gait  and up/down 5 steps  neuro re-ed, balance, functional mobility and locomotion   Communication     Supervision   Mod I  change in frequency to 3x/week self-monitoring and correcting of errors   Safety/Cognition/ Behavioral Observations    baseline Supervision with complex  n/a     Pain     Vicodin 2 tabs prn given at [phone] for coccyx pain rating 7  Pain managed at or below 3  Assess and monitor pain and provide appropriate intervention   Skin     Skin intact  No skin breakdown  Assess skin q shift     *See Care Plan and progress notes for long and short-term goals.    Barriers to Discharge:  see above      Possible Resolutions to Barriers:    see above      Discharge Planning/Teaching Needs:    Return home with intermittent assist from daughter and son-was falling PTA      Team Discussion:    Right knee buckles due to arthritis, pain issues with tailbone fracture.  Was falling PTA.  Goals supervision level for cues and safety   Revisions to Treatment Plan:    None    Continued Need for Acute Rehabilitation Level of Care: The patient requires daily medical management by a physician with specialized training in physical medicine and rehabilitation for the following conditions: Daily direction of a multidisciplinary physical rehabilitation program  to ensure safe treatment while eliciting the highest outcome that is of practical value to the patient.: Yes Daily medical management of patient stability for increased activity during participation in an intensive rehabilitation regime.: Yes Daily analysis of laboratory values and/or radiology reports with any subsequent need for medication adjustment of medical intervention for : Neurological problems;Other  [name] 01/24/2014, 9:46 AM          Patient ID: Maurice Stone, male   DOB: [birth year], 73 y.o.   MRN: [id number]

## 2014-01-31 NOTE — Progress Notes (Signed)
Social Work Patient ID: Maurice Stone, male   DOB: 1941-06-10, 73 y.o.   MRN: [id number] Spoke with pt and son and daughter via telephone to discuss team conference progression toward goals and team's recommendation of 24 hr supervision level. Daughter plans to be here Sat at 9;00-11;00 for family education.  Will also leave private duty list and assisted living facility list in his room.  All aware of the team's recommendation. Pt will think about and work on with his children once home.  He does not plan to make a decision before he leaves the hospital.  Will find out rental cost of wheelchair, due to pt's [place] will not cover unless He goes in person to the [place] to receive this.  Will continue to work on discharge plans.

## 2014-02-01 ENCOUNTER — Inpatient Hospital Stay (HOSPITAL_COMMUNITY): Payer: Medicare Other | Admitting: Physical Therapy

## 2014-02-01 ENCOUNTER — Inpatient Hospital Stay (HOSPITAL_COMMUNITY): Payer: Medicare Other | Admitting: Occupational Therapy

## 2014-02-01 DIAGNOSIS — E1149 Type 2 diabetes mellitus with other diabetic neurological complication: Secondary | ICD-10-CM

## 2014-02-01 DIAGNOSIS — I6330 Cerebral infarction due to thrombosis of unspecified cerebral artery: Secondary | ICD-10-CM

## 2014-02-01 DIAGNOSIS — I10 Essential (primary) hypertension: Secondary | ICD-10-CM

## 2014-02-01 LAB — GLUCOSE, CAPILLARY
GLUCOSE-CAPILLARY: 114 mg/dL — AB (ref 70–99)
GLUCOSE-CAPILLARY: 154 mg/dL — AB (ref 70–99)
GLUCOSE-CAPILLARY: 155 mg/dL — AB (ref 70–99)
Glucose-Capillary: 145 mg/dL — ABNORMAL HIGH (ref 70–99)
Glucose-Capillary: 172 mg/dL — ABNORMAL HIGH (ref 70–99)

## 2014-02-01 MED ORDER — PRAVASTATIN SODIUM 20 MG PO TABS: 20.0000 mg | ORAL_TABLET | Freq: Every day | ORAL | Status: DC

## 2014-02-01 MED ORDER — GABAPENTIN 600 MG PO TABS: 600.0000 mg | ORAL_TABLET | Freq: Three times a day (TID) | ORAL | Status: DC

## 2014-02-01 MED ORDER — METOPROLOL TARTRATE 25 MG PO TABS: 25.0000 mg | ORAL_TABLET | Freq: Two times a day (BID) | ORAL | Status: DC

## 2014-02-01 MED ORDER — SULINDAC 150 MG PO TABS: 150.0000 mg | ORAL_TABLET | Freq: Two times a day (BID) | ORAL | Status: DC

## 2014-02-01 MED ORDER — QUETIAPINE FUMARATE 200 MG PO TABS: 200.0000 mg | ORAL_TABLET | Freq: Every day | ORAL | Status: DC

## 2014-02-01 MED ORDER — ALBUTEROL SULFATE HFA 108 (90 BASE) MCG/ACT IN AERS: 1.0000 | INHALATION_SPRAY | Freq: Four times a day (QID) | RESPIRATORY_TRACT | Status: DC | PRN

## 2014-02-01 MED ORDER — CLOPIDOGREL BISULFATE 75 MG PO TABS: 75.0000 mg | ORAL_TABLET | Freq: Every day | ORAL | Status: AC

## 2014-02-01 MED ORDER — BUPROPION HCL 100 MG PO TABS: 100.0000 mg | ORAL_TABLET | Freq: Two times a day (BID) | ORAL | Status: DC

## 2014-02-01 MED ORDER — AMLODIPINE BESYLATE 10 MG PO TABS: 10.0000 mg | ORAL_TABLET | Freq: Every day | ORAL | Status: DC

## 2014-02-01 MED ORDER — FENOFIBRATE 160 MG PO TABS: 160.0000 mg | ORAL_TABLET | Freq: Every day | ORAL | Status: DC

## 2014-02-01 MED ORDER — TAMSULOSIN HCL 0.4 MG PO CAPS: 0.4000 mg | ORAL_CAPSULE | Freq: Every day | ORAL | Status: DC

## 2014-02-01 MED ORDER — HYDROCODONE-ACETAMINOPHEN 5-325 MG PO TABS: 2.0000 | ORAL_TABLET | Freq: Four times a day (QID) | ORAL | Status: DC | PRN

## 2014-02-01 MED ORDER — LOSARTAN POTASSIUM 100 MG PO TABS: 100.0000 mg | ORAL_TABLET | Freq: Every day | ORAL | Status: DC

## 2014-02-01 MED ORDER — SERTRALINE HCL 100 MG PO TABS: 200.0000 mg | ORAL_TABLET | Freq: Every day | ORAL | Status: DC

## 2014-02-01 MED ORDER — INSULIN GLARGINE 100 UNIT/ML ~~LOC~~ SOLN: SUBCUTANEOUS | Status: DC

## 2014-02-01 MED ORDER — INSULIN ASPART 100 UNIT/ML ~~LOC~~ SOLN: 17.0000 [IU] | Freq: Three times a day (TID) | SUBCUTANEOUS | Status: DC

## 2014-02-01 MED ORDER — DONEPEZIL HCL 10 MG PO TABS: 10.0000 mg | ORAL_TABLET | Freq: Every day | ORAL | Status: AC

## 2014-02-01 MED ORDER — PANTOPRAZOLE SODIUM 40 MG PO TBEC: 40.0000 mg | DELAYED_RELEASE_TABLET | Freq: Every day | ORAL | Status: DC

## 2014-02-01 NOTE — Progress Notes (Signed)
73 y.o. right handed male with history of hypertension, diabetes mellitus, CAD,COPD as well as CVA 6 years ago with some residual slurred speech maintained on aspirin. Patient lives alone independent prior to admission. Presented 01/15/2014 with frequent falls and right-sided weakness and slurred speech. MRI shows acute nonhemorrhagic left paramedian pontine infarct as well as remote infarct left basal ganglia. MRA of the head without stenosis or occlusion. Echocardiogram with ejection fraction 94% grade 1 diastolic dysfunction. Carotid Dopplers no ICA stenosis. Patient did not receive TPA. Neurology services consulted aspirin increased to 325 mg daily   Subjective/Complaints: In good spirits. Good appetite. Excited to go home..    Review of Systems - Negative except tailbone pain  Objective: Vital Signs: Blood pressure 115/63, pulse 58, temperature 97.5 F (36.4 C), temperature source Oral, resp. rate 17, weight 98 kg (216 lb 0.8 oz), SpO2 95.00%. No results found. Results for orders placed during the hospital encounter of 01/18/14 (from the past 72 hour(s))  GLUCOSE, CAPILLARY     Status: None   Collection Time    01/29/14 11:43 AM      Result Value Ref Range   Glucose-Capillary 87  70 - 99 mg/dL   Comment 1 Notify RN    GLUCOSE, CAPILLARY     Status: Abnormal   Collection Time    01/29/14  4:57 PM      Result Value Ref Range   Glucose-Capillary 205 (*) 70 - 99 mg/dL  GLUCOSE, CAPILLARY     Status: Abnormal   Collection Time    01/29/14  8:44 PM      Result Value Ref Range   Glucose-Capillary 169 (*) 70 - 99 mg/dL  GLUCOSE, CAPILLARY     Status: Abnormal   Collection Time    01/30/14  7:27 AM      Result Value Ref Range   Glucose-Capillary 163 (*) 70 - 99 mg/dL   Comment 1 Notify RN    GLUCOSE, CAPILLARY     Status: Abnormal   Collection Time    01/30/14 11:43 AM      Result Value Ref Range   Glucose-Capillary 150 (*) 70 - 99 mg/dL   Comment 1 Notify RN    GLUCOSE,  CAPILLARY     Status: Abnormal   Collection Time    01/30/14  4:53 PM      Result Value Ref Range   Glucose-Capillary 254 (*) 70 - 99 mg/dL  GLUCOSE, CAPILLARY     Status: Abnormal   Collection Time    01/30/14  9:34 PM      Result Value Ref Range   Glucose-Capillary 106 (*) 70 - 99 mg/dL  GLUCOSE, CAPILLARY     Status: None   Collection Time    01/31/14  7:22 AM      Result Value Ref Range   Glucose-Capillary 82  70 - 99 mg/dL   Comment 1 Notify RN    GLUCOSE, CAPILLARY     Status: Abnormal   Collection Time    01/31/14 11:26 AM      Result Value Ref Range   Glucose-Capillary 107 (*) 70 - 99 mg/dL   Comment 1 Notify RN    GLUCOSE, CAPILLARY     Status: Abnormal   Collection Time    01/31/14  4:54 PM      Result Value Ref Range   Glucose-Capillary 110 (*) 70 - 99 mg/dL  GLUCOSE, CAPILLARY     Status: Abnormal   Collection Time  01/31/14  9:10 PM      Result Value Ref Range   Glucose-Capillary 137 (*) 70 - 99 mg/dL  GLUCOSE, CAPILLARY     Status: Abnormal   Collection Time    02/01/14  7:17 AM      Result Value Ref Range   Glucose-Capillary 114 (*) 70 - 99 mg/dL   Comment 1 Notify RN       HEENT: normal Cardio: RRR Resp: CTA B/L GI: BS positive and NT, ND Extremity:  No Edema Skin:   Intact Neuro: Alert/Oriented, Cranial Nerve II-XII normal, Normal Sensory, Normal Motor and Abnormal FMC Ataxic/ dec FMC. Able to engage right hand for self-care, feeding, manipulation. Fair insight Musc/Skel:  Other sacral and coccygeal tenderness Gen NAD Mood/affect delayed responses but otherwise approp  Assessment/Plan: 1. Functional deficits secondary to Left pontine infarct which require 3+ hours per day of interdisciplinary therapy in a comprehensive inpatient rehab setting. Physiatrist is providing close team supervision and 24 hour management of active medical problems listed below. Physiatrist and rehab team continue to assess barriers to discharge/monitor patient progress  toward functional and medical goals. FIM: FIM - Bathing Bathing Steps Patient Completed: Chest;Right Arm;Left Arm;Abdomen;Front perineal area;Right upper leg;Left upper leg;Right lower leg (including foot);Left lower leg (including foot);Buttocks Bathing: 5: Supervision: Safety issues/verbal cues  FIM - Upper Body Dressing/Undressing Upper body dressing/undressing steps patient completed: Thread/unthread right sleeve of pullover shirt/dresss;Thread/unthread left sleeve of pullover shirt/dress;Put head through opening of pull over shirt/dress;Pull shirt over trunk Upper body dressing/undressing: 5: Supervision: Safety issues/verbal cues FIM - Lower Body Dressing/Undressing Lower body dressing/undressing steps patient completed: Thread/unthread right underwear leg;Thread/unthread left underwear leg;Pull underwear up/down;Thread/unthread right pants leg;Thread/unthread left pants leg;Pull pants up/down;Fasten/unfasten pants;Don/Doff right sock;Don/Doff left sock;Don/Doff right shoe;Don/Doff left shoe;Fasten/unfasten right shoe;Fasten/unfasten left shoe Lower body dressing/undressing: 5: Supervision: Safety issues/verbal cues  FIM - Toileting Toileting steps completed by patient: Adjust clothing prior to toileting;Performs perineal hygiene;Adjust clothing after toileting Toileting Assistive Devices: Grab bar or rail for support Toileting: 5: Supervision: Safety issues/verbal cues  FIM - Radio producer Devices: Grab bars;Walker Toilet Transfers: 5-To toilet/BSC: Supervision (verbal cues/safety issues);5-From toilet/BSC: Supervision (verbal cues/safety issues)  FIM - Engineer, site Assistive Devices: Arm rests Bed/Chair Transfer: 5: Chair or W/C > Bed: Supervision (verbal cues/safety issues)  FIM - Locomotion: Wheelchair Distance: 150 Locomotion: Wheelchair: 5: Travels 150 ft or more: maneuvers on rugs and over door sills with supervision,  cueing or coaxing FIM - Locomotion: Ambulation Locomotion: Ambulation Assistive Devices: Other (comment) (HHA) Ambulation/Gait Assistance: 1: +2 Total assist Locomotion: Ambulation: 0: Activity did not occur  Comprehension Comprehension Mode: Auditory Comprehension: 5-Follows basic conversation/direction: With extra time/assistive device  Expression Expression Mode: Verbal Expression: 5-Expresses basic needs/ideas: With extra time/assistive device  Social Interaction Social Interaction: 6-Interacts appropriately with others with medication or extra time (anti-anxiety, antidepressant).  Problem Solving Problem Solving: 5-Solves basic 90% of the time/requires cueing < 10% of the time  Memory Memory: 4-Recognizes or recalls 75 - 89% of the time/requires cueing 10 - 24% of the time  Medical Problem List and Plan:  1. Functional deficits secondary to acute left paramedian pontine infarct  2. DVT Prophylaxis/Anticoagulation: Subcutaneous heparin. Monitor platelet counts and any signs of bleeding.  3. Pain Management: Clinoril 150 mg twice a day, Neurontin 600 mg 3 times a day, Hydrocodone        - qhs oxycontin---dc tonight 4. Mood/memory loss/depression: Wellbutrin 100 mg twice a day. Aricept 10 mg each bedtime,  Seroquel 200 mg each bedtime, Zoloft 200 mg daily.  5. Neuropsych: This patient is capable of making decisions on his own behalf.  6. Hypertension. Norvasc 10 mg daily, Cozaar 100 mg daily, Lopressor 50 mg twice a day.bradycardia reduce BB dose  7. Diabetes mellitus with peripheral neuropathy. Hemoglobin A1c 10.6. Lantus insulin 45 units PM and 50 u qam, NovoLog 15 units 3 times a day.---   -increased mealtime covg to 17u  -sugars tightly controlled yesterday 8. Hyperlipidemia. Lipitor  9.  Sacral pain pt reports xrays at [facility], repeat confirms sacrococcygeal jct fx, analgesics/avoiding direct pressure 10.  Constipation- senna  LOS (Days) 14 A FACE TO FACE EVALUATION WAS  PERFORMED  Ivory Bail T 02/01/2014, 9:07 AM

## 2014-02-01 NOTE — Progress Notes (Signed)
Occupational Therapy Session Notes  Patient Details  Name: Maurice [name] MRN: [id number] Date of Birth: 02-10-1941  Today's Date: 02/01/2014 Time: 0900-1000 and 100-200 Time Calculation (min): 60 min and 60 min  Short Term Goals: Week 2:  OT Short Term Goal 1 (Week 2): STG=Revised LTGs of overall Mod I at a w/c level and Supervision with shower transfers  Skilled Therapeutic Interventions/Progress Updates:  1)  Patient up in w/c and reporting that he had already retrieved all clothes and supplies needed for self care session.  Patient stated, "I thought about going ahead and getting into the shower but I decided I better wait on you".  Focused session on patient return demonstrating safe bathroom transfers, remember to alert this clinician when he was ready to stand in shower, groom at sink to include orienting w/c since his sink at home has a cabinet under it.  Patient demonstrated meeting all goals related to the above.  2)  Patient resting in w/c upon arrival and engaged in review and return demonstrate safe bathroom transfers when he goes home.  Patient propelled w/c to therapy apartment and used the diagram of his bathroom set up to demonstrate safe toilet and tub bench transfers.  Also engaged in BUE coordination exercises and review of discharge recommendations and the plan for family education tomorrow to occur with the weekend OT.  Therapy Documentation Precautions:  Precautions Precautions: Fall Precaution Comments: R sided hemiparesis and R knee buckling Restrictions Weight Bearing Restrictions: No Other Position/Activity Restrictions: fall risk Pain: No report of pain in either session ADL: See FIM for current functional status  Therapy/Group: Individual Therapy both sessions   [place], [place] 02/01/2014, 12:07 PM

## 2014-02-01 NOTE — Progress Notes (Signed)
Physical Therapy Discharge Summary  Patient Details  Name: Maurice [name] MRN: [id number] Date of Birth: 10-04-1940  Today's Date: 02/01/2014 Time: 1000-1100  60 minutes  Patient has met 8 of 8 long term goals due to improved activity tolerance, improved balance, improved postural control, increased strength, ability to compensate for deficits, functional use of  right upper extremity and right lower extremity, improved attention, improved awareness and improved coordination.  Patient to discharge at a wheelchair level Modified Independent. Patient's care partner unavailable to provide the necessary 24/7 supervision recommended at discharge for ambulatory level, therefore patient to discharge at mod I from w/c level.   Reasons goals not met: NA - all goals met  Recommendation:  Patient will benefit from ongoing skilled PT services in home health setting to continue to advance safe functional mobility, address ongoing impairments in balance, activity tolerance, postural control, strength, use of RUE/RLE, safety with ambulation, and minimize fall risk.  Equipment: Wheelchair  Reasons for discharge: treatment goals met and discharge from hospital  Patient/family agrees with progress made and goals achieved: Yes  Skilled Therapeutic Intervention: Patient received sitting in w/c. Initiated patient education regarding fall last evening; patient unable to give reason for why he declined to have family notified. Therapist stressing importance of notifying family at home after discharge about falls/safety risks due to patient having increased falls at home PTA and not telling family. Patient denies any pain or injury from fall and reports he remains ready for discharge today after therapy. Patient's daughter and son arriving for family education. This therapist had notified patient's daughter of fall via telephone before session as daughter had called earlier and did not think it was necessary to be  present for family training since "we won't be the ones taking care of him. Shouldn't a caregiver be qualified to do this already?" With time and continued education, daughter and son agreeable to coming to part of PT session for training (did not arrive in time for OT session). Family training for squat pivot transfers and toilet transfers at mod I level and car transfer with S/setup, set up to simulate home environment. Family educated on w/c and parts management. Educated family on OT recommendation for shower with Supervision ONLY, verbalized understanding. Family training for ambulation using RW and recommendation for min A-min guard and to be prepared for RLE buckling with fatigue, turns, cognitive challenges, R inattention, etc. Son demonstrated appropriate guarding technique with patient x 30 ft. Pt with no episodes of R knee buckling this date but decreased safety and LOB with turning in controlled environment. Family with continued concerns over patient's personality and history of not telling them of falls and fear of patient non-compliance with recommendations. Continued family education provided regarding recommendation for 24/7 supervision at discharge due to impaired cognition, balance, memory, R knee buckling, and patient history of falls (as well as not reporting falls to family). Pt's family in agreement and expressing concern over lack of ability to provide 24/7 S at this time; family plans to seek out ALF or hire 24/7 caregiver. Therapist reviewed equipment recommendations and need for w/c cushion due to prolonged time in w/c at mod I level; son reports having appt to fit pt for w/c at [facility] next week. Additionally, son reporting that patient's RW is still at his apartment and was not brought to hospital as patient had reported previously to [place]. Pt's family with no further questions and patient left sitting in w/c in room with family present.  PT  Discharge Precautions/Restrictions Precautions Precautions: Fall Precaution Comments: R sided hemiparesis, right inattention and R knee buckling Vital Signs Therapy Vitals Temp: 98.5 F (36.9 C) Temp src: Oral Pulse Rate: 76 Resp: 17 BP: 105/62 mmHg Patient Position (if appropriate): Sitting Oxygen Therapy SpO2: 98 % O2 Device: None (Room air) Pain Pain Assessment Pain Assessment: No/denies pain Vision/Perception    Baseline Vision/History: Glaucoma;Wears glasses  Wears Glasses: At all times  Perception  Comments: becomes a fall risk when RLE is not in optimal position when standing and when ambulating  Cognition Overall Cognitive Status: History of cognitive impairments - at baseline (Supervision with Complex HM tasks) Arousal/Alertness: Awake/alert Orientation Level: Oriented X4 Attention: Selective Selective Attention: Appears intact Selective Attention Impairment: Verbal complex Memory: Impaired Memory Impairment: Decreased recall of new information Awareness: Impaired Awareness Impairment: Emergent impairment Problem Solving: Impaired Problem Solving Impairment: Functional complex Executive Function: Organizing;Reasoning;Sequencing Reasoning: Impaired Reasoning Impairment: Verbal complex;Functional basic Sequencing: Impaired Sequencing Impairment: Verbal complex Organizing: Impaired Organizing Impairment: Verbal complex Decision Making: Impaired Decision Making Impairment: Verbal complex Safety/Judgment: Impaired Sensation Sensation Light Touch: Appears Intact Proprioception: Appears Intact Coordination Gross Motor Movements are Fluid and Coordinated: No Fine Motor Movements are Fluid and Coordinated: No Coordination and Movement Description: decreased speed, accuracy and rhythm for rapid alternating movements, finger to nose and isolated finger movements.  Rue more pronounced than LUE Motor  Motor Motor: Hemiplegia;Abnormal postural alignment and  control;Motor impersistence Motor - Discharge Observations: R sided hemiparesis  Mobility Bed Mobility Bed Mobility: Supine to Sit;Sit to Supine Supine to Sit: 6: Modified independent (Device/Increase time);HOB flat Transfers Transfers: Yes Sit to Stand: 6: Modified independent (Device/Increase time);With upper extremity assist;With armrests Stand to Sit: 6: Modified independent (Device/Increase time);With armrests;With upper extremity assist Squat Pivot Transfers: 6: Modified independent (Device/Increase time);With upper extremity assistance;With armrests Locomotion  Ambulation Ambulation: Yes Ambulation/Gait Assistance: 4: Min assist Assistive device: Rolling walker Gait Gait: Yes Gait Pattern: Impaired Gait Pattern: Narrow base of support;Step-through pattern Stairs / Additional Locomotion Stairs: Yes Stairs Assistance: 5: Supervision Stair Management Technique: Two rails;Step to pattern Height of Stairs: 6 Wheelchair Mobility Wheelchair Mobility: Yes Wheelchair Assistance: 6: Modified independent (Device/Increase time) Environmental health practitioner: Both upper extremities Wheelchair Parts Management: Independent  Trunk/Postural Assessment  Cervical Assessment Cervical Assessment: Exceptions to [facility] (forward head posture) Thoracic Assessment Thoracic Assessment: Within Functional Limits Lumbar Assessment Lumbar Assessment: Within Functional Limits Postural Control Postural Control: Deficits on evaluation Righting Reactions: delayed Protective Responses: delayed Postural Limitations: L lateral trunk lean in sitting and standing is less pronounced at discharge.  Balance Static Sitting Balance Static Sitting - Balance Support: Bilateral upper extremity supported;No upper extremity supported;Feet supported Static Sitting - Level of Assistance: 6: Modified independent (Device/Increase time) Dynamic Sitting Balance Sitting balance - Comments: Mod I during ADL Static Standing  Balance Static Standing - Balance Support: Left upper extremity supported;During functional activity;Right upper extremity supported;Bilateral upper extremity supported Static Standing - Level of Assistance: 6: Modified independent (Device/Increase time) Extremity Assessment  RUE Assessment RUE Assessment: Exceptions to WFL (AROM WFL, strength 4+/5 - 5/5) LUE Assessment LUE Assessment: Within Functional Limits RLE Assessment RLE Assessment: Exceptions to [facility] RLE Strength RLE Overall Strength: Deficits RLE Overall Strength Comments: WFL except hip flexion 3+/5 LLE Assessment LLE Assessment: Within Functional Limits  See FIM for current functional status  Laretta Alstrom 02/01/2014, 5:43 PM

## 2014-02-01 NOTE — Discharge Instructions (Signed)
Inpatient Rehab Discharge Instructions  Maurice [name] Discharge date and time: No discharge date for patient encounter.   Activities/Precautions/ Functional Status: Activity: activity as tolerated Diet: diabetic diet Wound Care: none needed Functional status:  ___ No restrictions     ___ Walk up steps independently ___ 24/7 supervision/assistance   ___ Walk up steps with assistance ___ Intermittent supervision/assistance  ___ Bathe/dress independently ___ Walk with walker     ___ Bathe/dress with assistance ___ Walk Independently    ___ Shower independently __x STROKE/TIA DISCHARGE INSTRUCTIONS SMOKING Cigarette smoking nearly doubles your risk of having a stroke & is the single most alterable risk factor  If you smoke or have smoked in the last 12 months, you are advised to quit smoking for your health.  Most of the excess cardiovascular risk related to smoking disappears within a year of stopping.  Ask you doctor about anti-smoking medications  [place] Quit Line: 1-800-QUIT NOW  Free Smoking Cessation Classes [phone]  CHOLESTEROL Know your levels; limit fat & cholesterol in your diet  Lipid Panel     Component Value Date/Time   CHOL 164 01/16/2014 0605   TRIG 248* 01/16/2014 0605   HDL 40 01/16/2014 0605   CHOLHDL 4.1 01/16/2014 0605   VLDL 50* 01/16/2014 0605   LDLCALC 74 01/16/2014 0605      Many patients benefit from treatment even if their cholesterol is at goal.  Goal: Total Cholesterol (CHOL) less than 160  Goal:  Triglycerides (TRIG) less than 150  Goal:  HDL greater than 40  Goal:  LDL (LDLCALC) less than 100   BLOOD PRESSURE American Stroke Association blood pressure target is less that 120/80 mm/Hg  Your discharge blood pressure is:  BP: 116/64 mmHg  Monitor your blood pressure  Limit your salt and alcohol intake  Many individuals will require more than one medication for high blood pressure  DIABETES (A1c is a blood sugar average for last 3 months)  Goal HGBA1c is under 7% (HBGA1c is blood sugar average for last 3 months)  Diabetes:     Lab Results  Component Value Date   HGBA1C 10.6* 01/16/2014     Your HGBA1c can be lowered with medications, healthy diet, and exercise.  Check your blood sugar as directed by your physician  Call your physician if you experience unexplained or low blood sugars.  PHYSICAL ACTIVITY/REHABILITATION Goal is 30 minutes at least 4 days per week  Activity: Increase activity slowly, Therapies: Physical Therapy: Home Health Return to work:   Activity decreases your risk of heart attack and stroke and makes your heart stronger.  It helps control your weight and blood pressure; helps you relax and can improve your mood.  Participate in a regular exercise program.  Talk with your doctor about the best form of exercise for you (dancing, walking, swimming, cycling).  DIET/WEIGHT Goal is to maintain a healthy weight  Your discharge diet is: Carb Control  liquids Your height is:    Your current weight is: Weight: 95.2 kg (209 lb 14.1 oz) Your Body Mass Index (BMI) is:     Following the type of diet specifically designed for you will help prevent another stroke.  Your goal weight range is:    Your goal Body Mass Index (BMI) is 19-24.  Healthy food habits can help reduce 3 risk factors for stroke:  High cholesterol, hypertension, and excess weight.  RESOURCES Stroke/Support Group:  Call [facility] PROVIDED/REVIEWED AND GIVEN TO PATIENT Stroke  warning signs and symptoms How to activate emergency medical system (call 911). Medications prescribed at discharge. Need for follow-up after discharge. Personal risk factors for stroke. Pneumonia vaccine given:  Flu vaccine given:  My questions have been answered, the writing is legible, and I understand these instructions.  I will adhere to these goals & educational materials that have been provided to me after my discharge from the hospital.      _ Walk with assistance    ___ Shower with assistance ___ No alcohol     ___ Return to work/school ________  Special Instructions:    COMMUNITY REFERRALS UPON DISCHARGE:    Home Health:   PT, OT, RN, [place] NUUVO:[phone] Date of last service:02/02/2014    Medical Equipment/Items Ordered:WHEELCHAIR  Agency/Supplier:[facility]     [phone] Other:FOLLOW UP [facility] MD TO QUALIFY FOR EQUIPMENT [place] LIST ALONG WITH ASSISTED LIVING LIST  GENERAL COMMUNITY RESOURCES FOR PATIENT/FAMILY: Support Groups; [facility] SUPPORT GROUP  My questions have been answered and I understand these instructions. I will adhere to these goals and the provided educational materials after my discharge from the hospital.  Patient/Caregiver Signature _______________________________ Date __________  Clinician Signature _______________________________________ Date __________  Please bring this form and your medication list with you to all your follow-up doctor's appointments.

## 2014-02-01 NOTE — Progress Notes (Signed)
Physical Therapy Session Note  Patient Details  Name: Maurice [name] MRN: [id number] Date of Birth: [birth year]  Today's Date: 02/01/2014 Time: 0017-4944 Time Calculation (min): 57 min  Short Term Goals: Week 2:  PT Short Term Goal 1 (Week 2): STG=LTG due to LOS, S overall  Skilled Therapeutic Interventions/Progress Updates:   Pt received seated in w/c, agreeable to therapy. Session focused on transfer training, gait, and stair negotiation. Pt performed squat pivot transfers to w/c, regular bed in ADL apartment, furniture, mat table, and toilet with mod I. Pt performed car transfer from w/c level with supervision/setup. Gait training using RW 2 x 150 ft in controlled environment and x 25 ft in home environment, min A overall with only 1 instance of knee buckling that did not require assist to recover and no LOB noted. Pt negotiated up/down 5 stairs x 2 using rail and close supervision. Pt returned to room and left sitting in w/c with CSW present. Pt made mod I in room from w/c level this date.   Therapy Documentation Precautions:  Precautions Precautions: Fall Precaution Comments: R sided hemiparesis and R knee buckling Restrictions Weight Bearing Restrictions: No Other Position/Activity Restrictions: fall risk Vital Signs: Therapy Vitals Pulse Rate: 58 BP: 115/63 mmHg Pain: Pain Assessment Pain Assessment: No/denies pain Locomotion : Ambulation Ambulation/Gait Assistance: 4: Min assist Wheelchair Mobility Distance: 150   See FIM for current functional status  Therapy/Group: Individual Therapy  Laretta Alstrom 02/01/2014, 12:02 PM

## 2014-02-01 NOTE — Progress Notes (Signed)
Social Work Discharge Note Discharge Note  The overall goal for the admission was met for:   Discharge location: Yes-HOME WITH INTERMITTENT ASSIST FROM CHILDREN  Length of Stay: Yes-15 DAYS  Discharge activity level: Yes-MOD/I W/C LEVEL AND SUPERVISION WITH AMBULATION  Home/community participation: Yes  Services provided included: MD, RD, PT, OT, SLP, RN, CM, TR, Pharmacy, Neuropsych and SW  Financial Services: Medicare and Private Insurance: [place]  Follow-up services arranged: Home Health: [facility]-PT,OT,RN,SW,AIDE, DME: [facility] and Patient/Family has no preference for HH/DME agencies  Comments (or additional information):FAMILY EDUCATION WITH DAUGHTER ON DAY OF DISCHARGE.  AWARE OF THE TEAM'S RECOMMENDATION OF 24 HR SUPERVISION LEVEL DUE TO FALLING PRIOR TO ADMISSION.  [facility], [facility] U[facility]IL [facility].  GIVEN PRIVATE DUTY AGENCY LIST ALONG WITH ASSISTED LIVING FACILITY  LIST.  FAMILY AND PT TO FOLLOW UP WITH IF FEELS NECESSARY.    Patient/Family verbalized understanding of follow-up arrangements: Yes  Individual responsible for coordination of the follow-up plan: KAREN-DAUGHTER, PATIENT & [name]-SON  Confirmed correct DME delivered: [name] 02/01/2014    [name]

## 2014-02-01 NOTE — Progress Notes (Signed)
Dr. Naaman Plummer notified of fall; no new orders received.  Patient declines having family notified of fall.  Will continue to monitor.

## 2014-02-01 NOTE — Discharge Summary (Signed)
[name], [name]             ACCOUNT NO.:  [id number]  MEDICAL RECORD NO.:  [id number]  LOCATION:  4M09C                        FACILITY:  [facility]  PHYSICIAN:  Charlett Blake, M.D.DATE OF BIRTH:  [birth year]  DATE OF ADMISSION:  01/18/2014 DATE OF DISCHARGE:                              DISCHARGE SUMMARY   DISCHARGE DIAGNOSES: 1. Functional deficits secondary to acute left paramedian pontine     infarction. 2. Subcutaneous heparin for deep vein thrombosis prophylaxis. 3. Pain management. 4. Depression with memory loss. 5. Hypertension. 6. Diabetes mellitus, peripheral neuropathy. 7. Hyperlipidemia. 8. Constipation, resolved.  HISTORY OF PRESENT ILLNESS:  This is a 73 year old right-handed male with history of diabetes mellitus as well as coronary artery disease and CVA 6 years ago with some residual slurred speech maintained on aspirin. The patient lived alone prior to admission and independent.  Presented January 15, 2014, with frequent falls and right-sided weakness as well as slurred speech.  MRI shows acute nonhemorrhagic left paramedian pontine infarction as well as remote infarct left basal ganglia.  MRA of the head without stenosis or occlusion.  Echocardiogram with ejection fraction 74% grade 1 diastolic dysfunction.  Carotid Dopplers with no ICA stenosis.  The patient did not receive t-PA.  Neurology Services consulted, maintained on subcutaneous heparin for DVT prophylaxis. Hemoglobin A1c 10.6.  Physical and occupational therapy ongoing.  The patient was admitted for a comprehensive rehab program.  PAST MEDICAL HISTORY:  See discharge diagnoses.  SOCIAL HISTORY:  Lives alone.  FUNCTIONAL HISTORY:  Prior to admission independent.  FUNCTIONAL STATUS:  Upon admission to rehab services was moderate assist for stand pivot transfers, needing assistance overall for transfer level, moderate assist supine to sit, min mod assist activities of  daily living.  PHYSICAL EXAMINATION:  VITAL SIGNS:  Blood pressure 120/63, pulse 51, temperature 97.9, respirations 18. GENERAL:  This was an alert male, in no acute distress.  Speech mildly dysarthric, but fully intelligible.  He follows commands.  Pupils were round and reactive to light. LUNGS:  Clear to auscultation. CARDIAC:  Regular rate and rhythm. ABDOMEN:  Soft, nontender.  Good bowel sounds.  REHABILITATION HOSPITAL COURSE:  The patient was admitted to inpatient rehab services with therapies initiated on a 3-hour daily basis consisting of physical therapy, occupational therapy and rehabilitation nursing.  The following issues were addressed during the patient's rehabilitation stay.  Pertaining to Maurice [name] acute left paramedian pontine infarct remained stable maintained on Plavix therapy. Subcutaneous heparin for DVT prophylaxis with no bleeding episodes. Pain management with the use of Neurontin 600 mg 3 times daily as well as hydrocodone for breakthrough pain.  He did have a history of depression with memory loss.  He continued on Zoloft as well as Seroquel with Aricept 10 mg at bedtime.  The patient appeared to be at his baseline.  Blood pressures controlled without any orthostatic hypotension.  He received education in regards to hypertension. Arrangements were to be made to follow up with a primary MD.  Diabetes mellitus with peripheral neuropathy.  Hemoglobin A1c 10.6.  He remained on insulin therapy.  Again, full diabetic teaching ongoing.  Bouts of constipation resolved with laxative assistance.  The patient received weekly collaborative  interdisciplinary team conferences to discuss estimated length of stay, family teaching, and any barriers to discharge.  Ambulating greater than 100 feet without assistive device. Requiring some min mod assist for loss of balance.  He would self- correct foot positions, modified independent to propel his wheelchair. Activities of  daily living; engaged in self-care, retraining to include shower, dressing and grooming.  Focused sessions on activity tolerance and memory regarding routine daily care.  Full teaching was completed. Plan was to be discharged to home with home health physical and occupational therapy as advised with family assistance arranged.  DISCHARGE MEDICATIONS: 1. Norvasc 10 mg p.o. daily. 2. Lipitor 20 mg p.o. daily. 3. Wellbutrin 100 mg p.o. b.i.d. 4. Plavix 75 mg p.o. daily. 5. Aricept 10 mg p.o. daily. 6. Fenofibrate 160 mg p.o. daily. 7. Neurontin 600 mg p.o. t.i.d. 8. Hydrocodone 2 tablets every 6 hours as needed for moderate pain,     dispense of 90 tablets. 9. Lantus insulin 45 units at bedtime, 50 units daily. 10.Claritin 10 mg p.o. daily. 11.Cozaar 100 mg p.o. daily. 12.Lopressor 25 mg p.o. b.i.d. 13.Protonix 40 mg p.o. daily. 14.Seroquel 200 mg p.o. at bedtime. 15.Zoloft 200 mg p.o. daily. 16.Clinoril 150 mg p.o. b.i.d. 17.Flomax 0.4 mg p.o. daily.  DIET:  Diabetic diet.  SPECIAL INSTRUCTIONS:  The patient would follow up Dr. Alysia Penna at the [facility] March 25, 2014, [facility] 2 months call for appointment, medical management, appointment to be made with PCP, home health physical and occupational therapy as recommended.     Lauraine Rinne, P.A.   ______________________________ Charlett Blake, M.D.    DA/MEDQ  D:  02/01/2014  T:  02/01/2014  Job:  [id number]  cc:   Charlett Blake, M.D.

## 2014-02-01 NOTE — Progress Notes (Signed)
Patient found on floor in room by Marcie Bal, RN while trying to transfer himself independently from his old wheelchair to the new wheelchair that was delivered.  Patient was modified independent in his room with his wheelchair per therapy.  Vitals stable; patient denies pain and denies hitting his head; no new changes upon head to toe assessment.  Patient assisted back into his wheelchair.

## 2014-02-01 NOTE — Progress Notes (Signed)
Social Work Patient ID: Maurice [name], male   DOB: [birth year], 73 y.o.   MRN: [id number] Met with pt to discuss cost of the rental wheelchair which is $75.00 and [facility] contacting son regarding the payment. Pt wants to see if the [facility] will provide him with a tub bench and reports his walker was here on acute.  Went to [employer] and could not locate it.   Will contact management to discuss what happens next.  [facility] to deliver wheelchair to pt's room today or early am.  Daughter to be Here at 9;00 am to go through education with pt and discharge him home.  Have given pt list for private duty agencies and assisted living facilities in [place]. He and son will follow up on this if feels needed.  Spoke with son this am to answer questions regarding coverage for DME and lists left in room.  Pt feels good about Going home tomorrow and feels he has made good progress while here.  He and children are aware of the team's recommendation of 24 hr supervision due to pt's high risk to fall He was falling prior to admission to the hospital.  Pt aware of the recommendation to use the wheelchair when alone and ambulate when one of his children are there with him. Pt is competent and able to make his own decisions, even if not the safest for him.

## 2014-02-01 NOTE — Discharge Summary (Signed)
Discharge summary job [id number]

## 2014-02-01 NOTE — Progress Notes (Signed)
Occupational Therapy Discharge Summary  Patient Details  Name: Maurice [name] MRN: [id number] Date of Birth: [birth year]  Today's Date: 02/01/2014  Patient has met 89 of 11 long term goals due to improved activity tolerance, improved balance, postural control, ability to compensate for deficits, functional use of  RIGHT upper and RIGHT lower extremity, improved attention, improved awareness and improved coordination.  Patient to discharge at overall Modified Independent at a w/c level except Supervision for shower transfer and when standing in shower.   Patient's family did not attend any OT family education.  It was conveyed to them during PT session that pt continues to need 24 hour supervision for safety and is not safe to be living alone in his current condition.  Per PT and nursing his daughter had stated that she felt like 2 hours of education was unnecessary for them to attend, especially since he had someone else that was going to help him (unsure who the "someone else" is).  Feel pt continues to need 24 hour supervision for safety as he is still a high fall risk with right inattention, RUE coordination deficits as well as balance deficits.  He needs mod instructional cueing overall to carry over safety from session to session with regards to wheelchair placement during transfers and locking brakes.  Because of these feel he is not safe to be alone.   Reasons goals not met: n/a secondary to all goals met  Recommendation:  Patient will benefit from ongoing skilled OT services in home health setting to continue to advance functional skills in the area of BADL, iADL and Reduce care partner burden.  Equipment: tub bench  Reasons for discharge: treatment goals met and discharge from hospital  Patient/family agrees with progress made and goals achieved: Yes  OT Discharge Precautions/Restrictions  Precautions Precautions: Fall Precaution Comments: R sided hemiparesis, right inattention and  R knee buckling Pain Pain Assessment Pain Assessment: No/denies pain ADL Mod I w/c level (when standing to wash peri area or buttocks or pull up/down pants-patient to stabilize with UE).  Supervision for shower transfer and when standing in shower. Vision/Perception  Vision- History Baseline Vision/History: Glaucoma;Wears glasses Wears Glasses: At all times Vision- Assessment Additional Comments: Patient reports wears glasses all the time since this CVA.  Sometimes blurry when looks at TV.  "last time I drove was the day I cam ein with this stroke" Perception Comments: becomes a fall risk when RLE is not in optimal position when standing and when ambulating  Cognition Overall Cognitive Status: History of cognitive impairments - at baseline (Supervision with Complex HM tasks) Arousal/Alertness: Awake/alert Orientation Level: Oriented X4 Attention: Selective Selective Attention: Appears intact Selective Attention Impairment: Verbal complex Memory: Impaired Memory Impairment: Decreased recall of new information Awareness: Impaired Awareness Impairment: Emergent impairment Problem Solving: Impaired Problem Solving Impairment: Functional complex Executive Function: Organizing;Reasoning;Sequencing Reasoning: Impaired Reasoning Impairment: Verbal complex;Functional basic Sequencing: Impaired Sequencing Impairment: Verbal complex Organizing: Impaired Organizing Impairment: Verbal complex Decision Making: Impaired Decision Making Impairment: Verbal complex Safety/Judgment: Impaired Sensation Coordination Gross Motor Movements are Fluid and Coordinated: No Fine Motor Movements are Fluid and Coordinated: No Coordination and Movement Description: decreased speed, accuracy and rhythm for rapid alternating movements, finger to nose and isolated finger movements.  Rue more pronounced than LUE Motor  Motor Motor: Hemiplegia;Abnormal postural alignment and control;Motor  impersistence Motor - Discharge Observations: R sided hemiparesis Mobility  Bed Mobility Bed Mobility: Supine to Sit Supine to Sit: 5: Supervision;HOB flat;6: Modified independent (Device/Increase time) Transfers Sit  to Stand: 6: Modified independent (Device/Increase time);With upper extremity assist;From bed;From chair/3-in-1;With armrests;From toilet Stand to Sit: 6: Modified independent (Device/Increase time);To toilet;With armrests;To bed;To chair/3-in-1;With upper extremity assist  Trunk/Postural Assessment  Cervical Assessment Cervical Assessment: Exceptions to [facility] (forward head posture) Thoracic Assessment Thoracic Assessment: Within Functional Limits Lumbar Assessment Lumbar Assessment: Within Functional Limits Postural Control Postural Control: Deficits on evaluation Righting Reactions: delayed Protective Responses: delayed Postural Limitations: L lateral trunk lean in sitting and standing is less pronounced at discharge.  Balance Static Sitting Balance Static Sitting - Balance Support: Bilateral upper extremity supported;No upper extremity supported;Feet supported Static Sitting - Level of Assistance: 6: Modified independent (Device/Increase time) Dynamic Sitting Balance Sitting balance - Comments: Mod I during ADL Static Standing Balance Static Standing - Balance Support: Left upper extremity supported;During functional activity;Right upper extremity supported;Bilateral upper extremity supported Static Standing - Level of Assistance: 6: Modified independent (Device/Increase time) Extremity/Trunk Assessment RUE Assessment RUE Assessment: Exceptions to WFL (AROM WFL, strength 4+/5 - 5/5) LUE Assessment LUE Assessment: Within Functional Limits  See FIM for current functional status  SHAFFER, CHRISTINA 02/01/2014, 2:35 PM  Clyda Greener, OTR/L 02/02/2014 12:38 pm

## 2014-02-02 ENCOUNTER — Ambulatory Visit (HOSPITAL_COMMUNITY): Payer: Medicare Other | Admitting: Physical Therapy

## 2014-02-02 ENCOUNTER — Encounter (HOSPITAL_COMMUNITY): Payer: Medicare Other | Admitting: Occupational Therapy

## 2014-02-02 LAB — GLUCOSE, CAPILLARY: GLUCOSE-CAPILLARY: 83 mg/dL (ref 70–99)

## 2014-02-02 NOTE — Progress Notes (Signed)
Pt d/c'd with son and daughter. Family ed competed with therapy this AM and all questions answered. VSS, no complaints. Hortencia Conradi RN

## 2014-02-02 NOTE — Progress Notes (Signed)
73 y.o. right handed male with history of hypertension, diabetes mellitus, CAD,COPD as well as CVA 6 years ago with some residual slurred speech maintained on aspirin. Patient lives alone independent prior to admission. Presented 01/15/2014 with frequent falls and right-sided weakness and slurred speech. MRI shows acute nonhemorrhagic left paramedian pontine infarct as well as remote infarct left basal ganglia. MRA of the head without stenosis or occlusion. Echocardiogram with ejection fraction 14% grade 1 diastolic dysfunction. Carotid Dopplers no ICA stenosis. Patient did not receive TPA. Neurology services consulted aspirin increased to 325 mg daily   Subjective/Complaints: Slipped when trying to "Try out new wheel chair" yesterday---no new pains or problems fortunately.    Review of Systems - otherwise negative  Objective: Vital Signs: Blood pressure 114/62, pulse 53, temperature 98 F (36.7 C), temperature source Oral, resp. rate 18, weight 98 kg (216 lb 0.8 oz), SpO2 98.00%. No results found. Results for orders placed during the hospital encounter of 01/18/14 (from the past 72 hour(s))  GLUCOSE, CAPILLARY     Status: Abnormal   Collection Time    01/30/14 11:43 AM      Result Value Ref Range   Glucose-Capillary 150 (*) 70 - 99 mg/dL   Comment 1 Notify RN    GLUCOSE, CAPILLARY     Status: Abnormal   Collection Time    01/30/14  4:53 PM      Result Value Ref Range   Glucose-Capillary 254 (*) 70 - 99 mg/dL  GLUCOSE, CAPILLARY     Status: Abnormal   Collection Time    01/30/14  9:34 PM      Result Value Ref Range   Glucose-Capillary 106 (*) 70 - 99 mg/dL  GLUCOSE, CAPILLARY     Status: None   Collection Time    01/31/14  7:22 AM      Result Value Ref Range   Glucose-Capillary 82  70 - 99 mg/dL   Comment 1 Notify RN    GLUCOSE, CAPILLARY     Status: Abnormal   Collection Time    01/31/14 11:26 AM      Result Value Ref Range   Glucose-Capillary 107 (*) 70 - 99 mg/dL   Comment  1 Notify RN    GLUCOSE, CAPILLARY     Status: Abnormal   Collection Time    01/31/14  4:54 PM      Result Value Ref Range   Glucose-Capillary 110 (*) 70 - 99 mg/dL  GLUCOSE, CAPILLARY     Status: Abnormal   Collection Time    01/31/14  9:10 PM      Result Value Ref Range   Glucose-Capillary 137 (*) 70 - 99 mg/dL  GLUCOSE, CAPILLARY     Status: Abnormal   Collection Time    02/01/14  7:17 AM      Result Value Ref Range   Glucose-Capillary 114 (*) 70 - 99 mg/dL   Comment 1 Notify RN    GLUCOSE, CAPILLARY     Status: Abnormal   Collection Time    02/01/14 11:55 AM      Result Value Ref Range   Glucose-Capillary 154 (*) 70 - 99 mg/dL   Comment 1 Notify RN    GLUCOSE, CAPILLARY     Status: Abnormal   Collection Time    02/01/14  4:46 PM      Result Value Ref Range   Glucose-Capillary 145 (*) 70 - 99 mg/dL  GLUCOSE, CAPILLARY     Status: Abnormal  Collection Time    02/01/14  6:28 PM      Result Value Ref Range   Glucose-Capillary 172 (*) 70 - 99 mg/dL  GLUCOSE, CAPILLARY     Status: Abnormal   Collection Time    02/01/14  8:52 PM      Result Value Ref Range   Glucose-Capillary 155 (*) 70 - 99 mg/dL  GLUCOSE, CAPILLARY     Status: None   Collection Time    02/02/14  7:24 AM      Result Value Ref Range   Glucose-Capillary 83  70 - 99 mg/dL   Comment 1 Notify RN       HEENT: normal Cardio: RRR Resp: CTA B/L GI: BS positive and NT, ND Extremity:  No Edema Skin:   Intact Neuro: Alert/Oriented, Cranial Nerve II-XII normal, Normal Sensory, Normal Motor and Abnormal FMC Ataxic/ dec FMC. Able to engage right hand for self-care, feeding, manipulation. Fair insight Musc/Skel:  Other sacral and coccygeal tenderness Gen NAD Mood/affect delayed responses but otherwise approp  Assessment/Plan: 1. Functional deficits secondary to Left pontine infarct which require 3+ hours per day of interdisciplinary therapy in a comprehensive inpatient rehab setting. Physiatrist is providing  close team supervision and 24 hour management of active medical problems listed below. Physiatrist and rehab team continue to assess barriers to discharge/monitor patient progress toward functional and medical goals.  Home today    FIM: FIM - Bathing Bathing Steps Patient Completed: Chest;Right Arm;Left Arm;Abdomen;Front perineal area;Right upper leg;Left upper leg;Right lower leg (including foot);Left lower leg (including foot);Buttocks Bathing: 5: Supervision: Safety issues/verbal cues (Supervision if shower, Mod I if sponge bathe)  FIM - Upper Body Dressing/Undressing Upper body dressing/undressing steps patient completed: Thread/unthread right sleeve of pullover shirt/dresss;Thread/unthread left sleeve of pullover shirt/dress;Put head through opening of pull over shirt/dress;Pull shirt over trunk Upper body dressing/undressing: 6: More than reasonable amount of time FIM - Lower Body Dressing/Undressing Lower body dressing/undressing steps patient completed: Thread/unthread right underwear leg;Thread/unthread left underwear leg;Pull underwear up/down;Thread/unthread right pants leg;Thread/unthread left pants leg;Pull pants up/down;Fasten/unfasten pants;Don/Doff right sock;Don/Doff left sock;Don/Doff right shoe;Don/Doff left shoe;Fasten/unfasten right shoe;Fasten/unfasten left shoe Lower body dressing/undressing: 6: More than reasonable amount of time  FIM - Toileting Toileting steps completed by patient: Adjust clothing prior to toileting;Performs perineal hygiene;Adjust clothing after toileting Toileting Assistive Devices: Grab bar or rail for support Toileting: 6: More than reasonable amount of time  FIM - Radio producer Devices: Grab bars (wheelchair) Toilet Transfers: 6-More than reasonable amt of time  FIM - Control and instrumentation engineer Devices: Arm rests Bed/Chair Transfer: 6: Supine > Sit: No assist;6: Sit > Supine: No assist;6:  Bed > Chair or W/C: No assist;6: Chair or W/C > Bed: No assist  FIM - Locomotion: Wheelchair Distance: 150 Locomotion: Wheelchair: 6: Travels 150 ft or more, turns around, maneuvers to table, bed or toilet, negotiates 3% grade: maneuvers on rugs and over door sills independently FIM - Locomotion: Ambulation Locomotion: Ambulation Assistive Devices: Administrator Ambulation/Gait Assistance: 4: Min assist Locomotion: Ambulation: 4: Travels 150 ft or more with minimal assistance (Pt.>75%)  Comprehension Comprehension Mode: Auditory Comprehension: 5-Follows basic conversation/direction: With extra time/assistive device  Expression Expression Mode: Verbal Expression: 5-Expresses basic needs/ideas: With extra time/assistive device  Social Interaction Social Interaction: 6-Interacts appropriately with others with medication or extra time (anti-anxiety, antidepressant).  Problem Solving Problem Solving: 5-Solves basic 90% of the time/requires cueing < 10% of the time  Memory Memory: 4-Recognizes or recalls 75 -  89% of the time/requires cueing 10 - 24% of the time  Medical Problem List and Plan:  1. Functional deficits secondary to acute left paramedian pontine infarct  2. DVT Prophylaxis/Anticoagulation: Subcutaneous heparin. Monitor platelet counts and any signs of bleeding.  3. Pain Management: Clinoril 150 mg twice a day, Neurontin 600 mg 3 times a day, Hydrocodone        - qhs oxycontin---dc'ed   -no pain issues after fall 4. Mood/memory loss/depression: Wellbutrin 100 mg twice a day. Aricept 10 mg each bedtime, Seroquel 200 mg each bedtime, Zoloft 200 mg daily.  5. Neuropsych: This patient is capable of making decisions on his own behalf.  6. Hypertension. Norvasc 10 mg daily, Cozaar 100 mg daily, Lopressor 50 mg twice a day.bradycardia reduce BB dose  7. Diabetes mellitus with peripheral neuropathy. Hemoglobin A1c 10.6. Lantus insulin 45 units PM and 50 u qam, NovoLog 15 units 3  times a day.---   -increased mealtime covg to 17u  -sugars tightly controlled yesterday  -will need follow up and further adjustment as outpt 8. Hyperlipidemia. Lipitor  9.  Sacral pain pt reports xrays at [facility], repeat confirms sacrococcygeal jct fx, analgesics/avoiding direct pressure 10.  Constipation- senna  LOS (Days) 15 A FACE TO FACE EVALUATION WAS PERFORMED  SWARTZ,ZACHARY T 02/02/2014, 8:07 AM

## 2014-02-02 NOTE — Progress Notes (Signed)
Occupational Therapy Session Note  Patient Details  Name: Maurice Stone MRN: [id number] Date of Birth: 1940-08-08  Today's Date: 02/02/2014 Time: 0900-0955 Time Calculation (min): 55 min  Skilled Therapeutic Interventions/Progress Updates:    Pt seen for scheduled family education session however no family present during this session.  Pt's daughter had called earlier and talked to nursing that they didn't see why they needed to come up here for 2 hours as he was going to have somebody else assisting [name] at home.  She left number to be called back at.  Before this OT could call her PT got in touch with her and persuaded them to come up for at least part of one of the sessions so they could see how [name] was performing his transfers and so they could be aware of his decreased memory and decreased balance.  They attended part of the PT session after OT was completed.  During OT session had [name] practice squat pivot transfers to the tub bench and to the bedside commode which was setup like the diagram of his bathroom.  He was able to perform squat pivot transfers with supervision to both pieces of DME but needed min instructional cueing to position chair at the right spot to allow for scooting out to the edge of the chair and allowing for room to move his legs.  Transitioned to kitchen activity at wheelchair level.  He was able to gather and make a fried egg during session with mod instructional cueing for remembering to lock his brakes when reaching for items as well as the correct positioning of pots on the stove with handle turned in.  He also needed min instructional cueing for technique to position his wheelchair when attempting to reach the knobs on the back of the stove to turn it on and off.  Pt was able to cook the egg and turn off the stove with supervision.  Finished session by issuing pt FM coordination activities handout and foam block to work on Product/process development scientist and grip  strength.   Therapy Documentation Precautions:  Precautions Precautions: Fall Precaution Comments: R sided hemiparesis, right inattention and R knee buckling Restrictions Weight Bearing Restrictions: No Other Position/Activity Restrictions: fall risk   Pain: Pain Assessment Pain Assessment: No/denies pain ADL: See FIM for current functional status  Therapy/Group: Individual Therapy  Kamillah Didonato OTR/L 02/02/2014, 12:27 PM

## 2014-02-15 DIAGNOSIS — I10 Essential (primary) hypertension: Secondary | ICD-10-CM | POA: Diagnosis not present

## 2014-02-15 DIAGNOSIS — Z794 Long term (current) use of insulin: Secondary | ICD-10-CM

## 2014-02-15 DIAGNOSIS — E119 Type 2 diabetes mellitus without complications: Secondary | ICD-10-CM | POA: Diagnosis not present

## 2014-02-15 DIAGNOSIS — J449 Chronic obstructive pulmonary disease, unspecified: Secondary | ICD-10-CM

## 2014-02-15 DIAGNOSIS — F3289 Other specified depressive episodes: Secondary | ICD-10-CM

## 2014-02-15 DIAGNOSIS — I2510 Atherosclerotic heart disease of native coronary artery without angina pectoris: Secondary | ICD-10-CM | POA: Diagnosis not present

## 2014-02-15 DIAGNOSIS — F329 Major depressive disorder, single episode, unspecified: Secondary | ICD-10-CM

## 2014-02-15 DIAGNOSIS — I69959 Hemiplegia and hemiparesis following unspecified cerebrovascular disease affecting unspecified side: Secondary | ICD-10-CM | POA: Diagnosis not present

## 2014-02-15 DIAGNOSIS — Z8673 Personal history of transient ischemic attack (TIA), and cerebral infarction without residual deficits: Secondary | ICD-10-CM

## 2014-03-25 ENCOUNTER — Encounter: Payer: Medicare Other | Attending: Physical Medicine & Rehabilitation

## 2014-03-25 ENCOUNTER — Inpatient Hospital Stay: Payer: Medicare Other | Admitting: Physical Medicine & Rehabilitation

## 2014-03-25 ENCOUNTER — Telehealth: Payer: Self-pay

## 2014-03-25 NOTE — Telephone Encounter (Signed)
Attempted to contact Ilion @ [facility]. Left a voicemail to return call to clinic.

## 2014-03-25 NOTE — Telephone Encounter (Signed)
Maurice Stone (OT) is requesting a call back to discuss some issues patient is having with his right hip.

## 2014-03-28 ENCOUNTER — Observation Stay (HOSPITAL_COMMUNITY)
Admission: EM | Admit: 2014-03-28 | Discharge: 2014-04-01 | Disposition: A | Discharge status: Home / Self Care | Payer: Non-veteran care | Attending: Internal Medicine | Admitting: Internal Medicine

## 2014-03-28 ENCOUNTER — Encounter (HOSPITAL_COMMUNITY): Payer: Self-pay | Admitting: Emergency Medicine

## 2014-03-28 ENCOUNTER — Emergency Department (HOSPITAL_COMMUNITY): Payer: Non-veteran care

## 2014-03-28 ENCOUNTER — Observation Stay (HOSPITAL_COMMUNITY): Payer: Non-veteran care

## 2014-03-28 DIAGNOSIS — Z794 Long term (current) use of insulin: Secondary | ICD-10-CM | POA: Insufficient documentation

## 2014-03-28 DIAGNOSIS — E785 Hyperlipidemia, unspecified: Secondary | ICD-10-CM | POA: Diagnosis not present

## 2014-03-28 DIAGNOSIS — E1149 Type 2 diabetes mellitus with other diabetic neurological complication: Secondary | ICD-10-CM | POA: Insufficient documentation

## 2014-03-28 DIAGNOSIS — G8190 Hemiplegia, unspecified affecting unspecified side: Secondary | ICD-10-CM | POA: Diagnosis not present

## 2014-03-28 DIAGNOSIS — Z888 Allergy status to other drugs, medicaments and biological substances status: Secondary | ICD-10-CM | POA: Diagnosis not present

## 2014-03-28 DIAGNOSIS — Z79899 Other long term (current) drug therapy: Secondary | ICD-10-CM | POA: Diagnosis not present

## 2014-03-28 DIAGNOSIS — E1142 Type 2 diabetes mellitus with diabetic polyneuropathy: Secondary | ICD-10-CM | POA: Diagnosis not present

## 2014-03-28 DIAGNOSIS — Z8673 Personal history of transient ischemic attack (TIA), and cerebral infarction without residual deficits: Secondary | ICD-10-CM | POA: Diagnosis not present

## 2014-03-28 DIAGNOSIS — E119 Type 2 diabetes mellitus without complications: Secondary | ICD-10-CM | POA: Diagnosis present

## 2014-03-28 DIAGNOSIS — D649 Anemia, unspecified: Secondary | ICD-10-CM | POA: Insufficient documentation

## 2014-03-28 DIAGNOSIS — D696 Thrombocytopenia, unspecified: Secondary | ICD-10-CM | POA: Insufficient documentation

## 2014-03-28 DIAGNOSIS — G629 Polyneuropathy, unspecified: Secondary | ICD-10-CM

## 2014-03-28 DIAGNOSIS — I10 Essential (primary) hypertension: Secondary | ICD-10-CM | POA: Diagnosis present

## 2014-03-28 DIAGNOSIS — J4489 Other specified chronic obstructive pulmonary disease: Secondary | ICD-10-CM | POA: Insufficient documentation

## 2014-03-28 DIAGNOSIS — Z7902 Long term (current) use of antithrombotics/antiplatelets: Secondary | ICD-10-CM | POA: Diagnosis not present

## 2014-03-28 DIAGNOSIS — R200 Anesthesia of skin: Secondary | ICD-10-CM | POA: Diagnosis present

## 2014-03-28 DIAGNOSIS — Z87891 Personal history of nicotine dependence: Secondary | ICD-10-CM | POA: Insufficient documentation

## 2014-03-28 DIAGNOSIS — E162 Hypoglycemia, unspecified: Secondary | ICD-10-CM | POA: Diagnosis not present

## 2014-03-28 DIAGNOSIS — J449 Chronic obstructive pulmonary disease, unspecified: Secondary | ICD-10-CM | POA: Diagnosis not present

## 2014-03-28 DIAGNOSIS — I6350 Cerebral infarction due to unspecified occlusion or stenosis of unspecified cerebral artery: Secondary | ICD-10-CM

## 2014-03-28 DIAGNOSIS — K219 Gastro-esophageal reflux disease without esophagitis: Secondary | ICD-10-CM | POA: Diagnosis not present

## 2014-03-28 DIAGNOSIS — R209 Unspecified disturbances of skin sensation: Secondary | ICD-10-CM | POA: Insufficient documentation

## 2014-03-28 DIAGNOSIS — I2510 Atherosclerotic heart disease of native coronary artery without angina pectoris: Secondary | ICD-10-CM | POA: Diagnosis not present

## 2014-03-28 DIAGNOSIS — R531 Weakness: Secondary | ICD-10-CM | POA: Diagnosis present

## 2014-03-28 DIAGNOSIS — I639 Cerebral infarction, unspecified: Secondary | ICD-10-CM | POA: Diagnosis present

## 2014-03-28 DIAGNOSIS — G589 Mononeuropathy, unspecified: Secondary | ICD-10-CM

## 2014-03-28 HISTORY — DX: Gastro-esophageal reflux disease without esophagitis: K21.9

## 2014-03-28 HISTORY — DX: Reserved for inherently not codable concepts without codable children: IMO0001

## 2014-03-28 HISTORY — DX: Procedure and treatment not carried out because of patient's decision for reasons of belief and group pressure: Z53.1

## 2014-03-28 HISTORY — DX: Unspecified osteoarthritis, unspecified site: M19.90

## 2014-03-28 LAB — URINALYSIS, ROUTINE W REFLEX MICROSCOPIC
Bilirubin Urine: NEGATIVE
GLUCOSE, UA: NEGATIVE mg/dL
Hgb urine dipstick: NEGATIVE
Ketones, ur: NEGATIVE mg/dL
Leukocytes, UA: NEGATIVE
NITRITE: NEGATIVE
Protein, ur: NEGATIVE mg/dL
SPECIFIC GRAVITY, URINE: 1.018 (ref 1.005–1.030)
Urobilinogen, UA: 1 mg/dL (ref 0.0–1.0)
pH: 6.5 (ref 5.0–8.0)

## 2014-03-28 LAB — CBC WITH DIFFERENTIAL/PLATELET
Basophils Absolute: 0 10*3/uL (ref 0.0–0.1)
Basophils Relative: 0 % (ref 0–1)
EOS PCT: 2 % (ref 0–5)
Eosinophils Absolute: 0.2 10*3/uL (ref 0.0–0.7)
HCT: 38.1 % — ABNORMAL LOW (ref 39.0–52.0)
Hemoglobin: 12.9 g/dL — ABNORMAL LOW (ref 13.0–17.0)
LYMPHS ABS: 2.1 10*3/uL (ref 0.7–4.0)
LYMPHS PCT: 33 % (ref 12–46)
MCH: 30.5 pg (ref 26.0–34.0)
MCHC: 33.9 g/dL (ref 30.0–36.0)
MCV: 90.1 fL (ref 78.0–100.0)
Monocytes Absolute: 0.5 10*3/uL (ref 0.1–1.0)
Monocytes Relative: 8 % (ref 3–12)
NEUTROS PCT: 57 % (ref 43–77)
Neutro Abs: 3.6 10*3/uL (ref 1.7–7.7)
PLATELETS: 132 10*3/uL — AB (ref 150–400)
RBC: 4.23 MIL/uL (ref 4.22–5.81)
RDW: 14 % (ref 11.5–15.5)
WBC: 6.4 10*3/uL (ref 4.0–10.5)

## 2014-03-28 LAB — COMPREHENSIVE METABOLIC PANEL
ALT: 52 U/L (ref 0–53)
AST: 36 U/L (ref 0–37)
Albumin: 3.5 g/dL (ref 3.5–5.2)
Alkaline Phosphatase: 102 U/L (ref 39–117)
Anion gap: 11 (ref 5–15)
BUN: 11 mg/dL (ref 6–23)
CALCIUM: 8.6 mg/dL (ref 8.4–10.5)
CO2: 26 meq/L (ref 19–32)
Chloride: 104 mEq/L (ref 96–112)
Creatinine, Ser: 1.1 mg/dL (ref 0.50–1.35)
GFR calc Af Amer: 76 mL/min — ABNORMAL LOW (ref >90)
GFR, EST NON AFRICAN AMERICAN: 66 mL/min — AB (ref >90)
Glucose, Bld: 149 mg/dL — ABNORMAL HIGH (ref 70–99)
Potassium: 4 mEq/L (ref 3.7–5.3)
SODIUM: 141 meq/L (ref 137–147)
TOTAL PROTEIN: 6.7 g/dL (ref 6.0–8.3)
Total Bilirubin: 0.4 mg/dL (ref 0.3–1.2)

## 2014-03-28 LAB — CBG MONITORING, ED: GLUCOSE-CAPILLARY: 135 mg/dL — AB (ref 70–99)

## 2014-03-28 LAB — I-STAT TROPONIN, ED: Troponin i, poc: 0.01 ng/mL (ref 0.00–0.08)

## 2014-03-28 LAB — GLUCOSE, CAPILLARY: GLUCOSE-CAPILLARY: 113 mg/dL — AB (ref 70–99)

## 2014-03-28 MED ORDER — ALBUTEROL SULFATE (2.5 MG/3ML) 0.083% IN NEBU: 2.5000 mg | INHALATION_SOLUTION | Freq: Four times a day (QID) | RESPIRATORY_TRACT | Status: DC | PRN

## 2014-03-28 MED ORDER — PANTOPRAZOLE SODIUM 40 MG PO TBEC
40.0000 mg | DELAYED_RELEASE_TABLET | Freq: Every day | ORAL | Status: DC
  Administered 2014-03-29 – 2014-04-01 (×4): 40 mg via ORAL
  Filled 2014-03-28 (×4): qty 1

## 2014-03-28 MED ORDER — SIMVASTATIN 20 MG PO TABS
20.0000 mg | ORAL_TABLET | Freq: Every day | ORAL | Status: DC
  Administered 2014-03-29 – 2014-03-31 (×3): 20 mg via ORAL
  Filled 2014-03-28 (×3): qty 1

## 2014-03-28 MED ORDER — QUETIAPINE FUMARATE 200 MG PO TABS
200.0000 mg | ORAL_TABLET | Freq: Every day | ORAL | Status: DC
  Administered 2014-03-28: 200 mg via ORAL
  Filled 2014-03-28: qty 4

## 2014-03-28 MED ORDER — SERTRALINE HCL 100 MG PO TABS
200.0000 mg | ORAL_TABLET | Freq: Every day | ORAL | Status: DC
  Administered 2014-03-29 – 2014-04-01 (×4): 200 mg via ORAL
  Filled 2014-03-28 (×3): qty 2
  Filled 2014-03-28: qty 4

## 2014-03-28 MED ORDER — HYDROCODONE-ACETAMINOPHEN 5-325 MG PO TABS
2.0000 | ORAL_TABLET | Freq: Four times a day (QID) | ORAL | Status: DC | PRN
  Administered 2014-03-29: 2 via ORAL
  Filled 2014-03-28: qty 2

## 2014-03-28 MED ORDER — CLOPIDOGREL BISULFATE 75 MG PO TABS
75.0000 mg | ORAL_TABLET | Freq: Once | ORAL | Status: AC
  Administered 2014-03-28: 75 mg via ORAL
  Filled 2014-03-28: qty 1

## 2014-03-28 MED ORDER — LORATADINE 10 MG PO TABS
10.0000 mg | ORAL_TABLET | Freq: Every day | ORAL | Status: DC
  Administered 2014-03-29 – 2014-04-01 (×4): 10 mg via ORAL
  Filled 2014-03-28 (×4): qty 1

## 2014-03-28 MED ORDER — CLOPIDOGREL BISULFATE 75 MG PO TABS
75.0000 mg | ORAL_TABLET | Freq: Every day | ORAL | Status: DC
  Administered 2014-03-29 – 2014-04-01 (×4): 75 mg via ORAL
  Filled 2014-03-28 (×4): qty 1

## 2014-03-28 MED ORDER — AMLODIPINE BESYLATE 10 MG PO TABS: 10.0000 mg | ORAL_TABLET | Freq: Every day | ORAL | Status: DC

## 2014-03-28 MED ORDER — SENNOSIDES-DOCUSATE SODIUM 8.6-50 MG PO TABS
1.0000 | ORAL_TABLET | Freq: Every evening | ORAL | Status: DC | PRN
  Administered 2014-03-29: 1 via ORAL
  Filled 2014-03-28: qty 1

## 2014-03-28 MED ORDER — FENOFIBRATE 160 MG PO TABS
160.0000 mg | ORAL_TABLET | Freq: Every day | ORAL | Status: DC
  Administered 2014-03-29 – 2014-04-01 (×4): 160 mg via ORAL
  Filled 2014-03-28 (×5): qty 1

## 2014-03-28 MED ORDER — INSULIN GLARGINE 100 UNIT/ML ~~LOC~~ SOLN
50.0000 [IU] | Freq: Two times a day (BID) | SUBCUTANEOUS | Status: DC
  Administered 2014-03-28: 50 [IU] via SUBCUTANEOUS
  Filled 2014-03-28 (×2): qty 0.5

## 2014-03-28 MED ORDER — ALBUTEROL SULFATE HFA 108 (90 BASE) MCG/ACT IN AERS: 1.0000 | INHALATION_SPRAY | Freq: Four times a day (QID) | RESPIRATORY_TRACT | Status: DC | PRN

## 2014-03-28 MED ORDER — STROKE: EARLY STAGES OF RECOVERY BOOK
Freq: Once | Status: AC
  Administered 2014-03-28: 21:00:00
  Filled 2014-03-28: qty 1

## 2014-03-28 MED ORDER — ENOXAPARIN SODIUM 40 MG/0.4ML ~~LOC~~ SOLN
40.0000 mg | SUBCUTANEOUS | Status: DC
  Administered 2014-03-28 – 2014-03-31 (×4): 40 mg via SUBCUTANEOUS
  Filled 2014-03-28 (×4): qty 0.4

## 2014-03-28 MED ORDER — METOPROLOL TARTRATE 25 MG PO TABS
25.0000 mg | ORAL_TABLET | Freq: Two times a day (BID) | ORAL | Status: DC
  Administered 2014-03-29 – 2014-04-01 (×6): 25 mg via ORAL
  Filled 2014-03-28 (×7): qty 1

## 2014-03-28 MED ORDER — GABAPENTIN 600 MG PO TABS
600.0000 mg | ORAL_TABLET | Freq: Three times a day (TID) | ORAL | Status: DC
  Administered 2014-03-28 – 2014-04-01 (×11): 600 mg via ORAL
  Filled 2014-03-28 (×11): qty 1

## 2014-03-28 MED ORDER — INSULIN ASPART 100 UNIT/ML ~~LOC~~ SOLN
0.0000 [IU] | Freq: Three times a day (TID) | SUBCUTANEOUS | Status: DC
  Administered 2014-03-31: 2 [IU] via SUBCUTANEOUS
  Administered 2014-03-31: 3 [IU] via SUBCUTANEOUS

## 2014-03-28 MED ORDER — BUPROPION HCL 100 MG PO TABS
100.0000 mg | ORAL_TABLET | Freq: Two times a day (BID) | ORAL | Status: DC
  Administered 2014-03-28 – 2014-04-01 (×8): 100 mg via ORAL
  Filled 2014-03-28 (×11): qty 1

## 2014-03-28 MED ORDER — DONEPEZIL HCL 10 MG PO TABS
10.0000 mg | ORAL_TABLET | Freq: Every day | ORAL | Status: DC
  Administered 2014-03-28 – 2014-03-31 (×4): 10 mg via ORAL
  Filled 2014-03-28 (×4): qty 1

## 2014-03-28 NOTE — ED Provider Notes (Signed)
CSN: [id number]     Arrival date & time 03/28/14  1445 History   None    Chief Complaint  Patient presents with  . Stroke Symptoms    began at 0530, resolved within an hour     (Consider location/radiation/quality/duration/timing/severity/associated sxs/prior Treatment) HPI Maurice Stone 72 y.o. with a history of CVA (12/2013) with residual right UE and LE deficits present with numbness in bilateral upper extremities and uncoordinated movements of his RUE. He reports at 0530 this morning he had generalized numbness of BUE. This lasted for 20 minutes and completely resolved. No known exacerbating or relieving factors. He reports having similar symptoms intermittently for "months". The patient was standing at the refrigerator and just filled a glass of milk and dropped it. He was holding it in his right hand. He reports "my mind told my hand to drop it but I didn't;t want". This has been ongoing since his stroke as well. He denies any changes of his weakness. His numbness has resolved as well. Denies chest pain, SOB, N/V/D, abdominal pain, dysuria.   Past Medical History  Diagnosis Date  . Hypertension   . Diabetes mellitus without complication   . Coronary artery disease   . COPD (chronic obstructive pulmonary disease)   . Stroke    History reviewed. No pertinent past surgical history. No family history on file. History  Substance Use Topics  . Smoking status: Former Smoker -- 10 years    Types: Cigarettes    Quit date: 01/17/1973  . Smokeless tobacco: Not on file  . Alcohol Use: 0.6 oz/week    1 Cans of beer per week    Review of Systems  Neurological: Positive for numbness.  All other systems reviewed and are negative.     Allergies  Lisinopril  Home Medications   Prior to Admission medications   Medication Sig Start Date End Date Taking? Authorizing Provider  albuterol (PROVENTIL HFA;VENTOLIN HFA) 108 (90 BASE) MCG/ACT inhaler Inhale 1 puff into the lungs every 6  (six) hours as needed for wheezing or shortness of breath. 02/01/14   Lavon Paganini Angiulli, PA-C  amLODipine (NORVASC) 10 MG tablet Take 1 tablet (10 mg total) by mouth daily. 02/01/14   Lavon Paganini Angiulli, PA-C  buPROPion (WELLBUTRIN) 100 MG tablet Take 1 tablet (100 mg total) by mouth 2 (two) times daily. 02/01/14   Lavon Paganini Angiulli, PA-C  carboxymethylcellulose (REFRESH PLUS) 0.5 % SOLN 1 drop 3 (three) times daily as needed.    Historical Provider, MD  Cholecalciferol 2000 UNITS CAPS Take 4,000 Units by mouth daily.    Historical Provider, MD  clopidogrel (PLAVIX) 75 MG tablet Take 1 tablet (75 mg total) by mouth daily. 02/01/14   Lavon Paganini Angiulli, PA-C  donepezil (ARICEPT) 10 MG tablet Take 1 tablet (10 mg total) by mouth at bedtime. 02/01/14   Lavon Paganini Angiulli, PA-C  fenofibrate 160 MG tablet Take 1 tablet (160 mg total) by mouth daily. 02/01/14   Lavon Paganini Angiulli, PA-C  gabapentin (NEURONTIN) 600 MG tablet Take 1 tablet (600 mg total) by mouth 3 (three) times daily. 02/01/14   Lavon Paganini Angiulli, PA-C  HYDROcodone-acetaminophen (NORCO/VICODIN) 5-325 MG per tablet Take 2 tablets by mouth every 6 (six) hours as needed for moderate pain. 02/01/14   Lavon Paganini Angiulli, PA-C  insulin aspart (NOVOLOG) 100 UNIT/ML injection Inject 17 Units into the skin 3 (three) times daily with meals. 02/01/14   Lavon Paganini Angiulli, PA-C  insulin glargine (LANTUS) 100 UNIT/ML injection  50 unit daily and 45 unit bedtime 02/01/14   Lavon Paganini Angiulli, PA-C  loratadine (CLARITIN) 10 MG tablet Take 10 mg by mouth daily.    Historical Provider, MD  losartan (COZAAR) 100 MG tablet Take 1 tablet (100 mg total) by mouth daily. 02/01/14   Lavon Paganini Angiulli, PA-C  metoprolol tartrate (LOPRESSOR) 25 MG tablet Take 1 tablet (25 mg total) by mouth 2 (two) times daily. 02/01/14   Lavon Paganini Angiulli, PA-C  pantoprazole (PROTONIX) 40 MG tablet Take 1 tablet (40 mg total) by mouth daily. 02/01/14   Lavon Paganini Angiulli, PA-C  pravastatin (PRAVACHOL) 20  MG tablet Take 1 tablet (20 mg total) by mouth daily. 02/01/14   Lavon Paganini Angiulli, PA-C  QUEtiapine (SEROQUEL) 200 MG tablet Take 1 tablet (200 mg total) by mouth at bedtime. 02/01/14   Lavon Paganini Angiulli, PA-C  sertraline (ZOLOFT) 100 MG tablet Take 2 tablets (200 mg total) by mouth daily. 02/01/14   Lavon Paganini Angiulli, PA-C  sulindac (CLINORIL) 150 MG tablet Take 1 tablet (150 mg total) by mouth 2 (two) times daily. 02/01/14   Lavon Paganini Angiulli, PA-C  tamsulosin (FLOMAX) 0.4 MG CAPS capsule Take 1 capsule (0.4 mg total) by mouth daily. 02/01/14   Daniel J Angiulli, PA-C   BP 121/65  Pulse 58  Temp(Src) 98.2 F (36.8 C) (Oral)  Resp 22  Ht 5\' 11"  (1.803 m)  Wt 215 lb (97.523 kg)  BMI 30.00 kg/m2  SpO2 94% Physical Exam  Nursing note and vitals reviewed. Constitutional: He is oriented to person, place, and time. He appears well-developed and well-nourished. No distress.  HENT:  Head: Normocephalic and atraumatic.  Eyes: Conjunctivae and EOM are normal. Right eye exhibits no discharge. Left eye exhibits no discharge.  Neck: Normal range of motion. Neck supple. No tracheal deviation present.  Cardiovascular: Normal rate, regular rhythm and normal heart sounds.  Exam reveals no friction rub.   No murmur heard. Pulmonary/Chest: Effort normal and breath sounds normal. No stridor. No respiratory distress. He has no wheezes. He has no rales. He exhibits no tenderness.  Abdominal: Soft. He exhibits no distension. There is no tenderness. There is no rebound and no guarding.  Neurological: He is alert and oriented to person, place, and time. GCS eye subscore is 4. GCS verbal subscore is 5. GCS motor subscore is 6.  Asymmetric smile, with def noted on the left Strength in flexion and extension at the shoulders, elbows, wrists 4/5 on the right and 5/5 on the left EHL 5/5 on the left and 5/5 on the right Grip strength 4/5 on the left and 5/5 on the right Strength in flexion and extension at the hips,  knees, and ankles 4/5 on the right Finger to nose (past pointing on the right and wnls on the left), heel toe shin (intact bilaterally) and rapid alternating movements (slow and possible deficit on right). Sensation over the dorsum of the hand over the 1st, second and 5th metacarpal, and the lateral forearm and lateral shoulder intact bilaterally.  Sensation over the medial and lateral malleolus, and over the 1st metatarsal intact bilaterally.  Skin: Skin is warm.  Psychiatric: He has a normal mood and affect.    ED Course  Procedures (including critical care time) Labs Review Labs Reviewed  CBG MONITORING, ED - Abnormal; Notable for the following:    Glucose-Capillary 135 (*)    All other components within normal limits  CBC WITH DIFFERENTIAL  COMPREHENSIVE METABOLIC PANEL  URINALYSIS, ROUTINE  W REFLEX MICROSCOPIC  CBG MONITORING, ED  I-STAT TROPOININ, ED    Imaging Review Ct Head (brain) Wo Contrast  03/28/2014   CLINICAL DATA:  Right-sided weakness.  EXAM: CT HEAD WITHOUT CONTRAST  TECHNIQUE: Contiguous axial images were obtained from the base of the skull through the vertex without intravenous contrast.  COMPARISON:  CT scan of January 15, 2014.  FINDINGS: Bony calvarium appears intact. Mild diffuse cortical atrophy is noted. Stable old left periventricular white matter infarction is noted. Mild chronic ischemic white matter disease is noted. Stable low density is noted in the pons consistent with old infarction. No mass effect or midline shift is noted. Ventricular size is within normal limits. There is no evidence of hemorrhage, acute infarction or mass lesion.  IMPRESSION: Mild diffuse cortical atrophy. Mild chronic ischemic white matter disease. Old infarctions are noted in the left periventricular white matter and pontine region. No acute intracranial abnormality seen.   Electronically Signed   By: Sabino Dick M.D.   On: 03/28/2014 15:40     EKG Interpretation   Date/Time:   Thursday March 28 2014 14:51:40 EDT Ventricular Rate:  60 PR Interval:  180 QRS Duration: 117 QT Interval:  464 QTC Calculation: 464 R Axis:   -59 Text Interpretation:  Sinus rhythm Left anterior fascicular block Baseline  wander in lead(s) II III aVF No significant change since last tracing  Confirmed by HORTON  MD, Loma Sousa ([id number]) on 03/28/2014 3:00:46 PM      MDM   Final diagnoses:  None    Pt with recent CVA presents with concern of BUE numbness and concern of un coordination of his RUE. Numbness has resolved and per the history did not follow any specific dermatomal pattern. There does appear to be some cerebellar deficits on his right side and he had generalized weakness of the RUE, which is similarly documented in the past. NO indication for TPA at this point. Neurology evaluated and rec admission for to optimize medical mgt. WIll admit to the hospitalist. Admitted without any events.  Care discussed with my attending, Dr. Reather Converse.     Kelby Aline, MD 03/29/14 0120

## 2014-03-28 NOTE — Progress Notes (Signed)
Report received from ED RN. Pt coming to [id number]. All questions answered.

## 2014-03-28 NOTE — ED Notes (Signed)
Patient to MRI.

## 2014-03-28 NOTE — ED Notes (Signed)
CBG =135 reported to nurse.

## 2014-03-28 NOTE — ED Notes (Signed)
Per EMS: hx prior stroke with right sided deficits that have resolved over time. patient from home, at 0530, patient states he experienced sudden weakness and lack of coordination on both sides, symptoms resolved within a half hour, upon home health nurse arrival she advised that patient should be taken to ED to be worked up for TIA,   VSS,  CBG 135 at ED, alert and oriented x4, no neuro deficits noted on initial exam. Patient denies changes in vision, states he has cataracts in left eye and cannot see in right eye for unknown reasons although this is not new.

## 2014-03-28 NOTE — Consult Note (Signed)
Stroke Neurology consult note    Chief Complaint: right arm and leg worsening weakness with b/l arm numbness and b/l leg numbness up to Knee  HPI: Maurice Stone is an 73 y.o. 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 He stated that he had stroke in 12/2013 which affected his right face, right arm and right leg. He went to rehab after the hospitalization and eventually sent home. He did not get any home PT or outpt PT. He lives alone and he stated at the beginning he felt very good and walking up and downstairs without problem. As time went by, he gradually felt worsening weakness and numbness on the right, he became dropping things on the right hand, and his right knee start to give out more. Today after waking up, he felt b/l arm and leg numbness up to knees. He is afraid that he had another stroke and he came to ER for evaluation.  He has Hx of HTN on losartan and metoprolol, he also has DM on insulin but last admission his A1C was 10.6, he has HLD on pravacol. He was admitted in June for left pontine stroke but MRI also find there are remote infarct of the left BG and coronal radiata.   He quit smoking for 30 years and denies drinking or illicit drugs.  LSN: last night tPA Given: No: out of window.  Past Medical History  Diagnosis Date  . Hypertension   . Diabetes mellitus without complication   . Coronary artery disease   . COPD (chronic obstructive pulmonary disease)   . Stroke     History reviewed. No pertinent past surgical history.  No family history on file. Social History:  reports that he quit smoking about 41 years ago. His smoking use included Cigarettes. He smoked 0.00 packs per day for 10 years. He does not have any smokeless tobacco history on file. He reports that he drinks about .6 ounces of alcohol per week. He reports that he does not use illicit drugs.  Allergies:   Allergies  Allergen Reactions  . Lisinopril Cough    Medications: I have reviewed the patient's current medications.  ROS:  All 14 systems were reviewed and all negative except mentioned in H&P  Physical Examination:  Temp:  [98.2 F (36.8 C)] 98.2 F (36.8 C) (09/03 1455) Pulse Rate:  [54-60] 59 (09/03 1630) Resp:  [15-22] 19 (09/03 1630) BP: (121-135)/(65-73) 135/72 mmHg (09/03 1630) SpO2:  [92 %-95 %] 95 % (09/03 1630) Weight:  [215 lb (97.523 kg)] 215 lb (97.523 kg) (09/03 1455)  General - Well nourished, well developed, in no apparent distress.  Ophthalmologic - not able to see through.  Cardiovascular - Regular rate and rhythm with no murmur.  Mental Status -  Level of arousal and orientation to time, place, and person were intact. Language including expression, naming, repetition, comprehension was assessed and found intact.  Cranial Nerves II - XII - II - Visual field intact OU. III, IV, VI - Extraocular movements intact. V - Facial sensation decreased on the right. VII - Facial movement intact bilaterally but right nasolabial fold flattening. VIII - Hearing & vestibular intact bilaterally. X - Palate elevates symmetrically, mild to moderate dysarthria. XI - Chin turning & shoulder shrug intact bilaterally. XII - Tongue protrusion intact.  Motor Strength - The patient's strength was 5-/5 in all extremities and pronator drift was absent.  Bulk was normal and fasciculations were absent.   Motor Tone - Muscle tone was assessed at the neck and appendages and was normal.  Reflexes - The patient's reflexes were diminished LEs and 1+ UEs and he had no pathological reflexes.  Sensory - Light touch, temperature/pinprick were assessed and were decreased on the right (50%).    Coordination - The patient had normal movements in the hands with no ataxia or dysmetria.  Tremor was absent.  Gait and Station - not tested.   Results for orders placed during the hospital  encounter of 03/28/14 (from the past 48 hour(s))  CBG MONITORING, ED     Status: Abnormal   Collection Time    03/28/14  2:55 PM      Result Value Ref Range   Glucose-Capillary 135 (*) 70 - 99 mg/dL   Comment 1 Documented in Chart     Comment 2 Notify RN    CBC WITH DIFFERENTIAL     Status: Abnormal   Collection Time    03/28/14  3:30 PM      Result Value Ref Range   WBC 6.4  4.0 - 10.5 K/uL   RBC 4.23  4.22 - 5.81 MIL/uL   Hemoglobin 12.9 (*) 13.0 - 17.0 g/dL   HCT 38.1 (*) 39.0 - 52.0 %   MCV 90.1  78.0 - 100.0 fL   MCH 30.5  26.0 - 34.0 pg   MCHC 33.9  30.0 - 36.0 g/dL   RDW 14.0  11.5 - 15.5 %   Platelets 132 (*) 150 - 400 K/uL   Neutrophils Relative % 57  43 - 77 %   Neutro Abs 3.6  1.7 - 7.7 K/uL   Lymphocytes Relative 33  12 - 46 %   Lymphs Abs 2.1  0.7 - 4.0 K/uL   Monocytes Relative 8  3 - 12 %   Monocytes Absolute 0.5  0.1 - 1.0 K/uL   Eosinophils Relative 2  0 - 5 %   Eosinophils Absolute 0.2  0.0 - 0.7 K/uL   Basophils Relative 0  0 - 1 %   Basophils Absolute 0.0  0.0 - 0.1 K/uL  COMPREHENSIVE METABOLIC PANEL     Status: Abnormal   Collection Time    03/28/14  3:30 PM      Result Value Ref Range   Sodium 141  137 - 147 mEq/L   Potassium 4.0  3.7 - 5.3 mEq/L   Chloride 104  96 - 112 mEq/L   CO2 26  19 - 32 mEq/L   Glucose, Bld 149 (*) 70 - 99 mg/dL   BUN 11  6 - 23 mg/dL   Creatinine, Ser 1.10  0.50 - 1.35 mg/dL   Calcium 8.6  8.4 - 10.5 mg/dL   Total Protein 6.7  6.0 - 8.3 g/dL   Albumin 3.5  3.5 - 5.2 g/dL   AST 36  0 - 37 U/L   ALT 52  0 - 53 U/L   Alkaline Phosphatase 102  39 - 117 U/L   Total Bilirubin 0.4  0.3 - 1.2 mg/dL   GFR calc non Af Amer 66 (*) >90 mL/min   GFR calc Af Amer 76 (*) >90 mL/min   Comment: (NOTE)     The eGFR has been calculated using the CKD EPI equation.     This calculation has not been validated in all clinical situations.     eGFR's persistently <90 mL/min signify possible Chronic Kidney  Disease.   Anion gap 11  5 -  15  I-STAT TROPOININ, ED     Status: None   Collection Time    03/28/14  3:56 PM      Result Value Ref Range   Troponin i, poc 0.01  0.00 - 0.08 ng/mL   Comment 3            Comment: Due to the release kinetics of cTnI,     a negative result within the first hours     of the onset of symptoms does not rule out     myocardial infarction with certainty.     If myocardial infarction is still suspected,     repeat the test at appropriate intervals.   Ct Head (brain) Wo Contrast  03/28/2014   IMPRESSION: Mild diffuse cortical atrophy. Mild chronic ischemic white matter disease. Old infarctions are noted in the left periventricular white matter and pontine region. No acute intracranial abnormality seen.    Assessment: 74 y.o. male with PMH of HTN, HLD, DM, CAD and recent left pontine stroke and remote left BG stroke presented with worsening right sided weakness over the last two months and complains of b/l UE and LE numbness since this morning. Exam did not show significant weakness but subjective numbness on the right which was his sequela of his last stroke. I do not think he had a new stroke instead he likely is deconditioned at home with recrudescence of his old stroke. Will recommend MRI brain to rule out acute stroke and stroke risk factor evaluation and modification. Recommend PT/OT re-eval and he may need another bout of rehab.    Stroke Risk Factors - diabetes mellitus, hyperlipidemia, hypertension and hx of stroke  Plan: 1. HgbA1c, fasting lipid panel 2. MRI of the brain without contrast. No MRA needed this time since he just had one in 12/2013. 3. PT consult, OT consult, Speech consult 4. Echocardiogram not needed this time since he just had one in june 5. Carotid dopplers not needed this time since he just had one in june 6. Prophylactic therapy-Antiplatelet med: Plavix - dose 10m (home medication) 7. Risk factor modification 8. Telemetry monitoring 9. Frequent neuro  checks  JRosalin Hawking MD PhD Stroke Neurology 03/28/2014 5:23 PM

## 2014-03-28 NOTE — ED Notes (Signed)
Pt placed into gown and on monitor upon arrival to room. Pt monitored by blood pressure, pulse ox, and 12 lead. Pts EKG given to Dr. Dina Rich

## 2014-03-28 NOTE — ED Notes (Signed)
Dr. Zavits at bedside   

## 2014-03-28 NOTE — ED Notes (Signed)
Neurohospitalist at bedside 

## 2014-03-28 NOTE — H&P (Signed)
[facility] History and Physical  TOA MIA XTG:[id number] DOB: May 25, 1941 DOA: 03/28/2014  Referring physician: EDP PCP: No primary provider on file.   Chief Complaint: Worsening numbness in the hands and feet.   HPI: Maurice Stone is a 73 y.o. male with recent h/o CVA hypertension, diabetes mellitus with neuropathy, COPD, anemia, comes in for worsening numbness i nthe legs and hands and frequent dropping things from his right hand. He came to ED thinking he might have another stroke. On arrival to ED, he appears comfortable and initial CT head did not reveal any acute intra cranial pathology. Neurology was consulted and an MRI brain was ordered for further evaluation.    Review of Systems:  See HPI otherwise negative.   Past Medical History  Diagnosis Date  . Hypertension   . Diabetes mellitus without complication   . Coronary artery disease   . COPD (chronic obstructive pulmonary disease)   . Stroke    History reviewed. No pertinent past surgical history. Social History:  reports that he quit smoking about 41 years ago. His smoking use included Cigarettes. He smoked 0.00 packs per day for 10 years. He does not have any smokeless tobacco history on file. He reports that he drinks about .6 ounces of alcohol per week. He reports that he does not use illicit drugs.  Allergies  Allergen Reactions  . Lisinopril Cough    No family history on file.   Prior to Admission medications   Medication Sig Start Date End Date Taking? Authorizing Provider  amLODipine (NORVASC) 10 MG tablet Take 1 tablet (10 mg total) by mouth daily. 02/01/14  Yes Daniel J Angiulli, PA-C  buPROPion (WELLBUTRIN) 100 MG tablet Take 1 tablet (100 mg total) by mouth 2 (two) times daily. 02/01/14  Yes Daniel J Angiulli, PA-C  Cholecalciferol 2000 UNITS CAPS Take 4,000 Units by mouth daily.   Yes Historical Provider, MD  clopidogrel (PLAVIX) 75 MG tablet Take 1 tablet (75 mg total) by mouth daily.  02/01/14  Yes Daniel J Angiulli, PA-C  donepezil (ARICEPT) 10 MG tablet Take 1 tablet (10 mg total) by mouth at bedtime. 02/01/14  Yes Daniel J Angiulli, PA-C  fenofibrate 160 MG tablet Take 1 tablet (160 mg total) by mouth daily. 02/01/14  Yes Daniel J Angiulli, PA-C  gabapentin (NEURONTIN) 600 MG tablet Take 1 tablet (600 mg total) by mouth 3 (three) times daily. 02/01/14  Yes Daniel J Angiulli, PA-C  HYDROcodone-acetaminophen (NORCO/VICODIN) 5-325 MG per tablet Take 2 tablets by mouth every 6 (six) hours as needed for moderate pain. 02/01/14  Yes Daniel J Angiulli, PA-C  insulin aspart (NOVOLOG) 100 UNIT/ML injection Inject 17 Units into the skin 3 (three) times daily with meals. 02/01/14  Yes Daniel J Angiulli, PA-C  insulin glargine (LANTUS) 100 UNIT/ML injection Inject 50 Units into the skin 2 (two) times daily.   Yes Historical Provider, MD  loratadine (CLARITIN) 10 MG tablet Take 10 mg by mouth daily.   Yes Historical Provider, MD  losartan (COZAAR) 100 MG tablet Take 1 tablet (100 mg total) by mouth daily. 02/01/14  Yes Daniel J Angiulli, PA-C  metoprolol tartrate (LOPRESSOR) 25 MG tablet Take 1 tablet (25 mg total) by mouth 2 (two) times daily. 02/01/14  Yes Daniel J Angiulli, PA-C  pantoprazole (PROTONIX) 40 MG tablet Take 1 tablet (40 mg total) by mouth daily. 02/01/14  Yes Daniel J Angiulli, PA-C  pravastatin (PRAVACHOL) 20 MG tablet Take 1 tablet (20 mg total) by mouth daily.  02/01/14  Yes Daniel J Angiulli, PA-C  QUEtiapine (SEROQUEL) 200 MG tablet Take 1 tablet (200 mg total) by mouth at bedtime. 02/01/14  Yes Daniel J Angiulli, PA-C  sertraline (ZOLOFT) 100 MG tablet Take 2 tablets (200 mg total) by mouth daily. 02/01/14  Yes Daniel J Angiulli, PA-C  albuterol (PROVENTIL HFA;VENTOLIN HFA) 108 (90 BASE) MCG/ACT inhaler Inhale 1 puff into the lungs every 6 (six) hours as needed for wheezing or shortness of breath. 02/01/14   Cathlyn Parsons, PA-C   Physical Exam: Filed Vitals:   03/28/14 1630  03/28/14 1645 03/28/14 1700 03/28/14 1715  BP: 135/72 128/110 114/58 127/71  Pulse: 59 58 69 65  Temp:      TempSrc:      Resp: 19 19 9 18   Height:      Weight:      SpO2: 95% 98% 95% 98%    Wt Readings from Last 3 Encounters:  03/28/14 97.523 kg (215 lb)  01/30/14 98 kg (216 lb 0.8 oz)  01/17/14 89.585 kg (197 lb 8 oz)    General:  Appears calm and comfortable Eyes: PERRL, normal lids, irises & conjunctiva Neck: no LAD, masses or thyromegaly Cardiovascular: RRR, no m/r/g. No LE edema. Telemetry: SR, no arrhythmias  Respiratory: CTA bilaterally, no w/r/r. Normal respiratory effort. Abdomen: soft, ntnd Skin: no rash or induration seen on limited exam Musculoskeletal: grossly normal tone BUE/BLE Neurologic: speech normal. No motor deficits. Decreased sensation in the right arm.           Labs on Admission:  Basic Metabolic Panel:  Recent Labs Lab 03/28/14 1530  NA 141  K 4.0  CL 104  CO2 26  GLUCOSE 149*  BUN 11  CREATININE 1.10  CALCIUM 8.6   Liver Function Tests:  Recent Labs Lab 03/28/14 1530  AST 36  ALT 52  ALKPHOS 102  BILITOT 0.4  PROT 6.7  ALBUMIN 3.5   No results found for this basename: LIPASE, AMYLASE,  in the last 168 hours No results found for this basename: AMMONIA,  in the last 168 hours CBC:  Recent Labs Lab 03/28/14 1530  WBC 6.4  NEUTROABS 3.6  HGB 12.9*  HCT 38.1*  MCV 90.1  PLT 132*   Cardiac Enzymes: No results found for this basename: CKTOTAL, CKMB, CKMBINDEX, TROPONINI,  in the last 168 hours  BNP (last 3 results) No results found for this basename: PROBNP,  in the last 8760 hours CBG:  Recent Labs Lab 03/28/14 1455  GLUCAP 135*    Radiological Exams on Admission: Ct Head (brain) Wo Contrast  03/28/2014   CLINICAL DATA:  Right-sided weakness.  EXAM: CT HEAD WITHOUT CONTRAST  TECHNIQUE: Contiguous axial images were obtained from the base of the skull through the vertex without intravenous contrast.  COMPARISON:   CT scan of January 15, 2014.  FINDINGS: Bony calvarium appears intact. Mild diffuse cortical atrophy is noted. Stable old left periventricular white matter infarction is noted. Mild chronic ischemic white matter disease is noted. Stable low density is noted in the pons consistent with old infarction. No mass effect or midline shift is noted. Ventricular size is within normal limits. There is no evidence of hemorrhage, acute infarction or mass lesion.  IMPRESSION: Mild diffuse cortical atrophy. Mild chronic ischemic white matter disease. Old infarctions are noted in the left periventricular white matter and pontine region. No acute intracranial abnormality seen.   Electronically Signed   By: Sabino Dick M.D.   On: 03/28/2014 15:40  EKG: sinus rhythm.   Assessment/Plan Active Problems:   Essential hypertension, benign   chronic Right sided weakness   GERD (gastroesophageal reflux disease)   Stroke   Diabetes mellitus   Right sided weakness, dysarthria, : Admit to telemetry and stroke work up in progress.  CT head revealed old strokes.  MRI brain done, but pending.  Recently done MRA, Carotids and echocardiogram.  Hgba1c, lipid panel in am.  Therapy evaluations in am.  Neurology consulted and recommendations given Resume plavix 75 mg daily.   Hypertension: Permissive hypertension  Diabetes mellitus: hgba1c ordered.  Resume home insulin.   Anemia : Mild normocytic. Continue to monitor.    Mild thrombocytopenia:  No signs of bleeding.        Code Status: full code.  DVT Prophylaxis: lovenox Family Communication: none at bedside Disposition Plan: admit to telemetry.   Time spent: 55 minutes.   [facility] Pager [phone]  **Disclaimer: This note may have been dictated with voice recognition software. Similar sounding words can inadvertently be transcribed and this note may contain transcription errors which may not have been corrected upon  publication of note.**

## 2014-03-28 NOTE — ED Notes (Signed)
Dr Lozier at bedside  

## 2014-03-28 NOTE — ED Notes (Addendum)
CBG 135  

## 2014-03-28 NOTE — ED Notes (Signed)
Dr. Akula at bedside.

## 2014-03-28 NOTE — ED Notes (Signed)
Patient prompted for urine, unable to urinate at this time

## 2014-03-28 NOTE — Progress Notes (Signed)
Pt arrived to floor at 1923. Alert and oriented, with no distress noted. Patient comfortable in room, with call be in reach.Updated on plan of care. Will continue to monitor. Verdie Drown RN BSN

## 2014-03-28 NOTE — ED Notes (Signed)
Pt placed on monitor upon arrival to room from MRI. Pt continues to be monitored by blood pressure, pulse ox, and 5 lead. Admitting at bedside.

## 2014-03-29 ENCOUNTER — Encounter (HOSPITAL_COMMUNITY): Payer: Self-pay | Admitting: General Practice

## 2014-03-29 DIAGNOSIS — M6281 Muscle weakness (generalized): Secondary | ICD-10-CM

## 2014-03-29 DIAGNOSIS — E785 Hyperlipidemia, unspecified: Secondary | ICD-10-CM

## 2014-03-29 DIAGNOSIS — R200 Anesthesia of skin: Secondary | ICD-10-CM | POA: Diagnosis present

## 2014-03-29 LAB — LIPID PANEL
CHOL/HDL RATIO: 2.4 ratio
Cholesterol: 174 mg/dL (ref 0–200)
HDL: 72 mg/dL (ref >39)
LDL Cholesterol: 88 mg/dL (ref 0–99)
Triglycerides: 71 mg/dL (ref <150)
VLDL: 14 mg/dL (ref 0–40)

## 2014-03-29 LAB — GLUCOSE, CAPILLARY
Glucose-Capillary: 50 mg/dL — ABNORMAL LOW (ref 70–99)
Glucose-Capillary: 55 mg/dL — ABNORMAL LOW (ref 70–99)
Glucose-Capillary: 61 mg/dL — ABNORMAL LOW (ref 70–99)
Glucose-Capillary: 130 mg/dL — ABNORMAL HIGH (ref 70–99)
Glucose-Capillary: 175 mg/dL — ABNORMAL HIGH (ref 70–99)
Glucose-Capillary: 175 mg/dL — ABNORMAL HIGH (ref 70–99)
Glucose-Capillary: 177 mg/dL — ABNORMAL HIGH (ref 70–99)

## 2014-03-29 LAB — HEMOGLOBIN A1C
HEMOGLOBIN A1C: 8.2 % — AB (ref <5.7)
MEAN PLASMA GLUCOSE: 189 mg/dL — AB (ref <117)

## 2014-03-29 MED ORDER — DEXTROSE 50 % IV SOLN
INTRAVENOUS | Status: AC
  Filled 2014-03-29: qty 50

## 2014-03-29 MED ORDER — QUETIAPINE FUMARATE 50 MG PO TABS
100.0000 mg | ORAL_TABLET | Freq: Every day | ORAL | Status: DC
  Administered 2014-03-29 – 2014-03-31 (×3): 100 mg via ORAL
  Filled 2014-03-29 (×4): qty 2

## 2014-03-29 MED ORDER — DEXTROSE 5 % IV SOLN
INTRAVENOUS | Status: DC
  Administered 2014-03-29: 08:00:00 via INTRAVENOUS

## 2014-03-29 MED ORDER — DEXTROSE 50 % IV SOLN
25.0000 mL | Freq: Once | INTRAVENOUS | Status: AC | PRN
  Administered 2014-03-29: 25 mL via INTRAVENOUS

## 2014-03-29 MED ORDER — BISACODYL 10 MG RE SUPP
10.0000 mg | Freq: Once | RECTAL | Status: AC
  Administered 2014-03-29: 10 mg via RECTAL
  Filled 2014-03-29: qty 1

## 2014-03-29 NOTE — Progress Notes (Signed)
Hypoglycemic Event  CBG: 61  Treatment: 15 GM carbohydrate snack  Symptoms: None  Follow-up CBG: Time:0708 CBG Result: 55  Possible Reasons for Event: Unknown  Comments/MD notified:no response at this time.  Pt. Asymptomatic at this time. Hypoglycemic protocol initiated. Will continue to monitor.     Messer, Gerard Bonus A  Remember to initiate Hypoglycemia Order Set & complete

## 2014-03-29 NOTE — Evaluation (Addendum)
Speech Language Pathology Evaluation Patient Details Name: Maurice Stone MRN: [id number] DOB: 1941-03-21 Today's Date: 03/29/2014 Time: 0850-0920 SLP Time Calculation (min): 30 min  Problem List:  Patient Active Problem List   Diagnosis Date Noted  . Stroke 03/28/2014  . Diabetes mellitus 03/28/2014  . CVA (cerebral infarction) 01/18/2014  . Acute CVA (cerebrovascular accident) 01/17/2014  . CVA (cerebral vascular accident) 01/15/2014  . Essential hypertension, benign 01/15/2014  . Bradycardia 01/15/2014  . chronic Right sided weakness 01/15/2014  . GERD (gastroesophageal reflux disease) 01/15/2014  . Neuropathy 01/15/2014   Past Medical History:  Past Medical History  Diagnosis Date  . Hypertension   . Diabetes mellitus without complication   . Coronary artery disease   . COPD (chronic obstructive pulmonary disease)   . Stroke    Past Surgical History: History reviewed. No pertinent past surgical history. HPI:  73 y.o. male with recent h/o CVA hypertension, diabetes mellitus with neuropathy, COPD, anemia, comes in for worsening numbness in the legs and hands and frequent dropping of things from his right hand. He came to ED thinking he might have had another stroke. On arrival to ED, he appears comfortable and initial CT head did not reveal any acute intracranial pathology. MRI was also negative for an acute stroke. Patient passed the stroke swallow screen, therefore, a SLE was administered.    Assessment / Plan / Recommendation Clinical Impression  Upon arrival, patient was consuming breakfast of regular textures with thin liquids without overt s/s of aspiration. Patient demonstrated decreased selective attention to self-feeding, especially in regards to his RUE which caused patient to drop liquids, utensils, etc. intermittently, however, patient demonstrated appropriate awareness of decreased attention. Patient also verbalized difficulty with medication management at home in  regards to organizing his pill box appropriately. Patient educated on need for a family member (4 children) to assist to ensure safety.  Patient demonstrated mild labial ROM on right and decreased vocal intensity, however, suspect this is baseline since previous stroke and did not impact his overall speech intelligibility. The patient is not appropriate for skilled SLP intervention at this venue of care, however, recommend f/u SLP services at next venue to maximize his cognitive function with higher-level, complex tasks and overall functional independence.  Patient verbalized understanding and agreement.     SLP Assessment  All further Speech Lanaguage Pathology  needs can be addressed in the next venue of care    Follow Up Recommendations  Home health SLP    Frequency and Duration N/A    Pertinent Vitals/Pain Pain Assessment: No/denies pain    SLP Evaluation Prior Functioning  Cognitive/Linguistic Baseline: Baseline deficits Baseline deficit details: due to previous stroke in 6/15  Lives With: Alone   Cognition  Overall Cognitive Status: History of cognitive impairments - at baseline Arousal/Alertness: Awake/alert Orientation Level: Oriented X4 Attention: Selective Selective Attention: Impaired Selective Attention Impairment: Functional basic Memory: Appears intact Awareness: Appears intact Problem Solving: Impaired Problem Solving Impairment: Functional complex Safety/Judgment: Appears intact Comments: Patient reporst difficulty with medication management at home prior to admission. Also with intermittent dropping of items out of his RUE during self-feeding due to decreased attention to task.     Comprehension  Auditory Comprehension Overall Auditory Comprehension: Appears within functional limits for tasks assessed Visual Recognition/Discrimination Discrimination: Within Function Limits Reading Comprehension Reading Status: Not tested    Expression Expression Primary  Mode of Expression: Verbal Verbal Expression Overall Verbal Expression: Appears within functional limits for tasks assessed Written Expression Dominant Hand: Right  Written Expression: Not tested   Oral / Motor Oral Motor/Sensory Function Overall Oral Motor/Sensory Function: Impaired at baseline Labial ROM: Reduced right Motor Speech Overall Motor Speech: Impaired at baseline Respiration: Within functional limits Phonation: Low vocal intensity Articulation: Within functional limitis Intelligibility: Intelligible Motor Planning: Witnin functional limits   GO     Autumn Pruitt 03/29/2014, 9:30 AM  [place], [place], [place]

## 2014-03-29 NOTE — Progress Notes (Signed)
Talked to patient about DCP; patient is refusing to go to SNF and request to go home at discharge; Patient has a friend that checks on him often;  See note below Received call from [facility] with [facility]- pt is managing well at home with their [facility] services but needs a [facility] because he is not taking his medication correctly especially his psych meds; Attending MD at discharge please order HHRN/ PT/ OT/ SW; Aneta Mins [phone]

## 2014-03-29 NOTE — Progress Notes (Signed)
STROKE TEAM PROGRESS NOTE   HISTORY Chief Complaint: right arm and leg worsening weakness with b/l arm numbness and b/l leg numbness up to Knee  HPI: Maurice Stone is an 73 y.o. 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 He stated that he had stroke in 12/2013 which affected his right face, right arm and right leg. He went to rehab after the hospitalization and eventually sent home. He did not get any home PT or outpt PT. He lives alone and he stated at the beginning he felt very good and walking up and downstairs without problem. As time went by, he gradually felt worsening weakness and numbness on the right, he became dropping things on the right hand, and his right knee start to give out more. Today after waking up, he felt b/l arm and leg numbness up to knees. He is afraid that he had another stroke and he came to ER for evaluation.  He has Hx of HTN on losartan and metoprolol, he also has DM on insulin but last admission his A1C was 10.6, he has HLD on pravacol. He was admitted in June for left pontine stroke but MRI also find there are remote infarct of the left BG and coronal radiata.  He quit smoking for 30 years and denies drinking or illicit drugs.  LSN: last night  tPA Given: No: out of window.  SUBJECTIVE (INTERVAL HISTORY) No family is at the bedside.  Overall he feels his condition is gradually improving. He stated that he felt better than yesterday and his MRI confirmed no acute infarct.   OBJECTIVE Temp:  [97.1 F (36.2 C)-98.4 F (36.9 C)] 97.6 F (36.4 C) (09/04 1351) Pulse Rate:  [48-80] 59 (09/04 1351) Cardiac Rhythm:  [-] Normal sinus rhythm (09/03 2000) Resp:  [9-18] 16 (09/04 1351) BP: (104-129)/(58-72) 104/63 mmHg (09/04 1351) SpO2:  [91 %-99 %] 92 % (09/04 1351) Weight:  [207 lb (93.895 kg)] 207 lb (93.895 kg) (09/03 1936)   Recent Labs Lab 03/29/14 0708 03/29/14 0719  03/29/14 0734 03/29/14 1115 03/29/14 1637  GLUCAP 55* 50* 130* 175* 175*    Recent Labs Lab 03/28/14 1530  NA 141  K 4.0  CL 104  CO2 26  GLUCOSE 149*  BUN 11  CREATININE 1.10  CALCIUM 8.6    Recent Labs Lab 03/28/14 1530  AST 36  ALT 52  ALKPHOS 102  BILITOT 0.4  PROT 6.7  ALBUMIN 3.5    Recent Labs Lab 03/28/14 1530  WBC 6.4  NEUTROABS 3.6  HGB 12.9*  HCT 38.1*  MCV 90.1  PLT 132*   No results found for this basename: CKTOTAL, CKMB, CKMBINDEX, TROPONINI,  in the last 168 hours No results found for this basename: LABPROT, INR,  in the last 72 hours  Recent Labs  03/28/14 1723  COLORURINE YELLOW  LABSPEC 1.018  PHURINE 6.5  GLUCOSEU NEGATIVE  HGBUR NEGATIVE  BILIRUBINUR NEGATIVE  KETONESUR NEGATIVE  PROTEINUR NEGATIVE  UROBILINOGEN 1.0  NITRITE NEGATIVE  LEUKOCYTESUR NEGATIVE       Component Value Date/Time   CHOL 174 03/29/2014 0501   TRIG 71 03/29/2014 0501   HDL 72 03/29/2014 0501   CHOLHDL 2.4 03/29/2014 0501   VLDL 14 03/29/2014 0501   LDLCALC 88 03/29/2014 0501   Lab Results  Component Value Date   HGBA1C 8.2* 03/29/2014      Component Value Date/Time  LABOPIA NONE DETECTED 01/16/2014 0134   COCAINSCRNUR NONE DETECTED 01/16/2014 0134   LABBENZ NONE DETECTED 01/16/2014 0134   AMPHETMU NONE DETECTED 01/16/2014 0134   THCU NONE DETECTED 01/16/2014 0134   LABBARB NONE DETECTED 01/16/2014 0134    No results found for this basename: ETH,  in the last 168 hours  Dg Chest 2 View  03/29/2014  IMPRESSION: No acute cardiopulmonary disease.     Ct Head (brain) Wo Contrast  03/28/2014   IMPRESSION: Mild diffuse cortical atrophy. Mild chronic ischemic white matter disease. Old infarctions are noted in the left periventricular white matter and pontine region. No acute intracranial abnormality seen.    Mr Brain Wo Contrast  03/28/2014   IMPRESSION: Chronic changes as described. No evidence for acute stroke or hemorrhage. Good general agreement with earlier  CT.       PHYSICAL EXAM  Temp:  [97.1 F (36.2 C)-98.4 F (36.9 C)] 97.6 F (36.4 C) (09/04 1351) Pulse Rate:  [48-80] 59 (09/04 1351) Resp:  [9-18] 16 (09/04 1351) BP: (104-129)/(58-72) 104/63 mmHg (09/04 1351) SpO2:  [91 %-99 %] 92 % (09/04 1351) Weight:  [207 lb (93.895 kg)] 207 lb (93.895 kg) (09/03 1936)  General - Well nourished, well developed, in no apparent distress.  Ophthalmologic - not able to see through.  Cardiovascular - Regular rate and rhythm with no murmur.  Mental Status -  Level of arousal and orientation to time, place, and person were intact.  Language including expression, naming, repetition, comprehension was assessed and found intact.  Cranial Nerves II - XII -  II - Visual field intact OU.  III, IV, VI - Extraocular movements intact.  V - Facial sensation decreased on the right.  VII - Facial movement intact bilaterally but right nasolabial fold flattening.  VIII - Hearing & vestibular intact bilaterally.  X - Palate elevates symmetrically, mild to moderate dysarthria.  XI - Chin turning & shoulder shrug intact bilaterally.  XII - Tongue protrusion intact.  Motor Strength - The patient's strength was 5-/5 in all extremities and pronator drift was absent. Bulk was normal and fasciculations were absent.  Motor Tone - Muscle tone was assessed at the neck and appendages and was normal.  Reflexes - The patient's reflexes were diminished LEs and 1+ UEs and he had no pathological reflexes.  Sensory - Light touch, temperature/pinprick were assessed and were decreased on the right (50%).  Coordination - The patient had normal movements in the hands with no ataxia or dysmetria. Tremor was absent.  Gait and Station - not tested.   ASSESSMENT/PLAN  [name] is a 73 y.o. male with history of HTN, HLD, DM, CAD and recent left pontine stroke and remote left BG stroke presented with worsening right sided weakness over the last two months and complains of  b/l UE and LE numbness yesterday. Exam does not felt pt had acute stroke which confirmed by MRI overnight. He is likely just deconditioned at home and needs further PT/OT help at home.   Deconditioning at home with recrudescence of old stroke symptoms     Continue home plavix  MRI  No acute stroke  LDL 88, not meeting goal of LDL <70  HgbA1c 8.2 still high  for VTE prophylaxis  Therapy needs:  CIR  Ongoing aggressive risk factor management  Risk factor education  Patient counseled to be compliant with his antithrombotic medications  Disposition:  CIR  Hypertension   Home meds:  metoprolol. Resumed in hospital  BP 114 - 135 past 24h  SBP goal 130/80  Stable  Patient counseled to be compliant with his blood pressure medications  Hyperlipidemia  Home meds:  pravastatin. Resumed in hospital (formulary change)  LDL 88, not meeting the goal   LDL goal <70 for diabetics  Continue zocor or lipitor to replace pravastatin  Diabetes  Home meds:  insulin  HgbA1c 8.2, better than last admission   Uncontrolled  Goal < 7.0  educated patient about lifestyle changes for diabetes treatment prevention  Other Stroke Risk Factors Advanced age   Hx stroke/TIA -     CAD  Hospital day # 1  Neurology will sign off. Please call with further questions. Thank you for the consult.  Rosalin Hawking, MD PhD Stroke Neurology 03/29/2014 5:03 PM    To contact Stroke Continuity provider, please refer to [email or web address] After hours, contact General Neurology

## 2014-03-29 NOTE — Progress Notes (Signed)
[facility] PROGRESS NOTE  Maurice Stone DDU:[id number] DOB: 1940-12-31 DOA: 03/28/2014 PCP: No primary provider on file.  Assessment/Plan: Worsening right side weakness, numbness extremities:  ? TIA, vs hypoglycemia.  CT head revealed old strokes.  MRI brain: negative for acute stroke.  Recently done MRA, Carotids and echocardiogram.  Hb A-1c pending. , LDL 88.  Therapy evaluations in am.  Continue with  plavix 75 mg daily.  Neurology following.  Continue with gabapentin.   Hypertension:  SBP in the 110 range.  Hold Norvasc.  Continue with metoprolol.   Diabetes mellitus:  hgba1c ordered.  Hold lantus due to hypoglycemic episode. Will start  IV fluids with D 5.   Anemia :  Mild normocytic. Continue to monitor.   Mild thrombocytopenia:  No signs of bleeding.   Code Status: Full Code.  Family Communication:  Disposition Plan: to be determine. Need correction of hypoglycemia.    Consultants:  Neurology  Procedures:  none  Antibiotics:  none  HPI/Subjective: Feeling well, eating breakfast.  Numbness resolved.   Objective: Filed Vitals:   03/29/14 0813  BP: 116/69  Pulse: 80  Temp: 98.2 F (36.8 C)  Resp: 16   No intake or output data in the 24 hours ending 03/29/14 0822 Filed Weights   03/28/14 1455 03/28/14 1936  Weight: 97.523 kg (215 lb) 93.895 kg (207 lb)    Exam:   General:  Alert in no distress.   Cardiovascular: S 1, S 2 RRR  Respiratory: CTA  Abdomen: BS present, soft, NT  Musculoskeletal: trace edema.   Neuro: no significant weakness right side, 4-5/5  Data Reviewed: Basic Metabolic Panel:  Recent Labs Lab 03/28/14 1530  NA 141  K 4.0  CL 104  CO2 26  GLUCOSE 149*  BUN 11  CREATININE 1.10  CALCIUM 8.6   Liver Function Tests:  Recent Labs Lab 03/28/14 1530  AST 36  ALT 52  ALKPHOS 102  BILITOT 0.4  PROT 6.7  ALBUMIN 3.5   No results found for this basename: LIPASE, AMYLASE,  in the last 168  hours No results found for this basename: AMMONIA,  in the last 168 hours CBC:  Recent Labs Lab 03/28/14 1530  WBC 6.4  NEUTROABS 3.6  HGB 12.9*  HCT 38.1*  MCV 90.1  PLT 132*   Cardiac Enzymes: No results found for this basename: CKTOTAL, CKMB, CKMBINDEX, TROPONINI,  in the last 168 hours BNP (last 3 results) No results found for this basename: PROBNP,  in the last 8760 hours CBG:  Recent Labs Lab 03/29/14 0649 03/29/14 0706 03/29/14 0708 03/29/14 0719 03/29/14 0734  GLUCAP 61* 35* 55* 50* 130*    No results found for this or any previous visit (from the past 240 hour(s)).   Studies: Dg Chest 2 View  03/29/2014   CLINICAL DATA:  stroke  EXAM: CHEST - 2 VIEW  COMPARISON:  11/11/2000 by report only  FINDINGS: Heart size upper limits normal. Lungs clear. Tortuous thoracic aorta. No effusion. Visualized skeletal structures are unremarkable.  IMPRESSION: No acute cardiopulmonary disease.   Electronically Signed   By: Arne Cleveland M.D.   On: 03/29/2014 02:51   Ct Head (brain) Wo Contrast  03/28/2014   CLINICAL DATA:  Right-sided weakness.  EXAM: CT HEAD WITHOUT CONTRAST  TECHNIQUE: Contiguous axial images were obtained from the base of the skull through the vertex without intravenous contrast.  COMPARISON:  CT scan of January 15, 2014.  FINDINGS: Bony calvarium appears intact. Mild diffuse cortical  atrophy is noted. Stable old left periventricular white matter infarction is noted. Mild chronic ischemic white matter disease is noted. Stable low density is noted in the pons consistent with old infarction. No mass effect or midline shift is noted. Ventricular size is within normal limits. There is no evidence of hemorrhage, acute infarction or mass lesion.  IMPRESSION: Mild diffuse cortical atrophy. Mild chronic ischemic white matter disease. Old infarctions are noted in the left periventricular white matter and pontine region. No acute intracranial abnormality seen.   Electronically  Signed   By: Sabino Dick M.D.   On: 03/28/2014 15:40   Mr Brain Wo Contrast  03/28/2014   CLINICAL DATA:  Stroke symptoms.  Now resolved.  EXAM: MRI HEAD WITHOUT CONTRAST  TECHNIQUE: Multiplanar, multiecho pulse sequences of the brain and surrounding structures were obtained without intravenous contrast.  COMPARISON:  None.  FINDINGS: No evidence for acute infarction, hemorrhage, mass lesion, hydrocephalus, or extra-axial fluid. Mild cerebral and cerebellar atrophy. Mild subcortical and periventricular T2 and FLAIR hyperintensities, likely chronic microvascular ischemic change. Remote deep white matter infarct affects the LEFT centrum semiovale and posterior basal ganglia. Remote lacunar infarcts also affect the LEFT pons. No evidence for acute infarct extension. No foci of chronic hemorrhage. Flow voids are maintained. Extracranial soft tissues unremarkable. Negative orbits. No acute sinus disease.  IMPRESSION: Chronic changes as described. No evidence for acute stroke or hemorrhage. Good general agreement with earlier CT.   Electronically Signed   By: Rolla Flatten M.D.   On: 03/28/2014 18:38    Scheduled Meds: . amLODipine  10 mg Oral Daily  . buPROPion  100 mg Oral BID  . clopidogrel  75 mg Oral Q breakfast  . donepezil  10 mg Oral QHS  . enoxaparin (LOVENOX) injection  40 mg Subcutaneous Q24H  . fenofibrate  160 mg Oral Daily  . gabapentin  600 mg Oral TID  . insulin aspart  0-9 Units Subcutaneous TID WC  . loratadine  10 mg Oral Daily  . metoprolol tartrate  25 mg Oral BID  . pantoprazole  40 mg Oral Daily  . QUEtiapine  200 mg Oral QHS  . sertraline  200 mg Oral Daily  . simvastatin  20 mg Oral q1800   Continuous Infusions: . dextrose 75 mL/hr at 03/29/14 1610    Active Problems:   Essential hypertension, benign   chronic Right sided weakness   GERD (gastroesophageal reflux disease)   Stroke   Diabetes mellitus    Time spent: 35 minutes.     Niel Hummer A  [facility] Pager [phone]. If 7PM-7AM, please contact night-coverage at [email or web address], password [facility] 03/29/2014, 8:22 AM  LOS: 1 day

## 2014-03-29 NOTE — ED Provider Notes (Signed)
Medical screening examination/treatment/procedure(s) were conducted as a shared visit with non-physician practitioner(s) or resident  and myself.  I personally evaluated the patient during the encounter and agree with the findings.   I have personally reviewed any xrays and/ or EKG's with the provider and I agree with interpretation.   Worsening stroke symptoms than baseline, worse right sided since this morning. Neuro consulted.  Pt has subtle right sided weakness and mild right dysmetria. Neck supple.  PERRL, EOMFI.  Plan for medicine obs/ admit.   Stroke sxs, numbness    Mariea Clonts, MD 03/29/14 [phone]

## 2014-03-29 NOTE — Evaluation (Addendum)
Physical Therapy Evaluation Patient Details Name: Maurice Stone MRN: [id number] DOB: [birth year] Today's Date: 03/29/2014   History of Present Illness  Patient is a 73 y/o male admitted with numbness in hands and legs and dropping items in right hand.  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  Clinical Impression  Patient presents with decreased independence and safety secondary to deficits listed below in PT problem list.  Main issue for going back home is he has reported and observed increased lethargy and falls asleep easily.  Feel may benefit from CIR to return to baseline wheelchair level with intermittent assist at home from family/aide.  PT to follow acutely as well to progress to mod independent level.    Follow Up Recommendations CIR    Equipment Recommendations  None recommended by PT    Recommendations for Other Services Rehab consult     Precautions / Restrictions Precautions Precautions: Fall Precaution Comments: reports fell previously prior to Dx of CVA in June, but none recent      Mobility  Bed Mobility Overal bed mobility: Needs Assistance Bed Mobility: Supine to Sit     Supine to sit: Supervision;HOB elevated     General bed mobility comments: increased time and cues for techique  Transfers Overall transfer level: Needs assistance   Transfers: Sit to/from Stand Sit to Stand: Min assist            Ambulation/Gait Ambulation/Gait assistance: Min assist Ambulation Distance (Feet): 100 Feet Assistive device: Rolling walker (2 wheeled) Gait Pattern/deviations: Step-through pattern;Trunk flexed     General Gait Details: right knee buckles, assist for balance, safety with walker  Stairs            Wheelchair Mobility    Modified Rankin (Stroke Patients Only) Modified Rankin (Stroke Patients Only) Pre-Morbid Rankin Score: Moderately severe disability Modified Rankin: Moderately severe disability      Balance Overall balance assessment: History of Falls;Needs assistance   Sitting balance-Leahy Scale: Fair     Standing balance support: Bilateral upper extremity supported Standing balance-Leahy Scale: Poor Standing balance comment: needs UE assist for balance with right LE weakness                             Pertinent Vitals/Pain Pain Assessment: No/denies pain    Home Living Family/patient expects to be discharged to:: Private residence Living Arrangements: Alone Available Help at Discharge: Available PRN/intermittently Type of Home: Apartment Home Access: Level entry     Home Layout: One level Home Equipment: Walker - 2 wheels;Bedside commode;Tub bench;Wheelchair - Psychologist, educational;Other (comment) (bed rail; life alert)      Prior Function Level of Independence: Independent with assistive device(s)         Comments: maneuvers at home from wheelchair level, assist for shower 2x/week, spnge bathes independent     Hand Dominance   Dominant Hand: Right    Extremity/Trunk Assessment   Upper Extremity Assessment: RUE deficits/detail RUE Deficits / Details: AROM shoulder flexion approx 80, PROM approx 110 with pain, strength grossly 3- in shoulder, 4- in elbow, reports decreased sensation compared to left   RUE Sensation: decreased light touch     Lower Extremity Assessment: RLE deficits/detail RLE Deficits / Details: AROM grossly WFL, strength hip flexion 3-/5, knee extension 4-/5    Cervical / Trunk Assessment: Kyphotic;Other exceptions  Communication   Communication: Other (comment) (some dysarthria)  Cognition Arousal/Alertness: Lethargic Behavior During  Therapy: WFL for tasks assessed/performed Overall Cognitive Status: History of cognitive impairments - at baseline                      General Comments      Exercises        Assessment/Plan    PT Assessment Patient needs continued PT services  PT Diagnosis  Abnormality of gait;Generalized weakness   PT Problem List Decreased strength;Decreased balance;Decreased mobility;Decreased safety awareness  PT Treatment Interventions DME instruction;Gait training;Balance training;Therapeutic exercise;Wheelchair mobility training;Functional mobility training;Therapeutic activities;Patient/family education   PT Goals (Current goals can be found in the Care Plan section) Acute Rehab PT Goals Patient Stated Goal: To get strong again to go home PT Goal Formulation: With patient Time For Goal Achievement: 04/12/14 Potential to Achieve Goals: Good    Frequency Min 4X/week   Barriers to discharge        Co-evaluation               End of Session Equipment Utilized During Treatment: Gait belt Activity Tolerance: Patient limited by fatigue Patient left: in chair;with call bell/phone within reach;with chair alarm set           Time: 0947-0962 PT Time Calculation (min): 32 min   Charges:   PT Evaluation $Initial PT Evaluation Tier I: 1 Procedure PT Treatments $Gait Training: 8-22 mins   PT G Codes:          [name],CYNDI 04-01-14, 10:55 AM Magda Kiel, [place] 2014/04/01

## 2014-03-29 NOTE — Progress Notes (Signed)
Pt not covered for his noon and dinner insulin coverage; pt cbg was 175 and had dextrose going; dextrose was stopped and when cbg was checked for the dinner time it was 175. MD was notified and informed of dextrose stopped and not running; MD ordered to not cover pt; discontinue dextrose fluids and monitor pt. Will continue to monitor pt quietly. Francis Gaines Taiesha Bovard RN.

## 2014-03-29 NOTE — Progress Notes (Signed)
Rehab Admissions Coordinator Note:  Patient was screened by Retta Diones for appropriateness for an Inpatient Acute Rehab Consult.  At this time, we are recommending HH.  However, if he does not progress well, can then consider inpatient rehab consult.    Retta Diones 03/29/2014, 12:23 PM  I can be reached at [phone].

## 2014-03-29 NOTE — Progress Notes (Signed)
Received call from [facility] with [place]- pt is managing well at home with their [facility] services but needs a [facility] because he is not taking his medication correctly especially his psych meds; Attending MD at discharge please order HHRN/ PT/ OT/ SW; Aneta Mins [phone]

## 2014-03-29 NOTE — Progress Notes (Signed)
UR Completed.  Maurice Stone [phone] 03/29/2014  

## 2014-03-29 NOTE — Evaluation (Signed)
Occupational Therapy Evaluation Patient Details Name: Maurice Stone MRN: [id number] DOB: January 31, 1941 Today's Date: 03/29/2014    History of Present Illness Patient is a 73 y/o male admitted with numbness in hands and legs and dropping items in right hand.  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   Clinical Impression   Pt familiar to therapist from inpatient rehab stay in June.  Left rehab at a wheelchair level for safety secondary to poor memory of safety issues and balance risk.  Still at this level with increased fall risk warranting 24 hour supervision for safety.  He will not agree to other therapy at [facility] but will agree to [facility] services.  Will continue to follow for acute OT to further progress with balance and safety.  Recommend pt stay at wheelchair level if discharged home.    Follow Up Recommendations  Home health OT;Supervision/Assistance - 24 hour    Equipment Recommendations  None recommended by OT       Precautions / Restrictions Precautions Precautions: Fall Precaution Comments: reports fell previously prior to Dx of CVA in June, but none recent      Mobility Bed Mobility                  Transfers Overall transfer level: Needs assistance Equipment used: 1 person hand held assist   Sit to Stand: Min assist              Balance Overall balance assessment: Needs assistance Sitting-balance support: No upper extremity supported;Feet supported Sitting balance-Leahy Scale: Fair       Standing balance-Leahy Scale: Poor                              ADL Overall ADL's : Needs assistance/impaired Eating/Feeding: Independent;Sitting   Grooming: Wash/dry hands;Min guard;Standing   Upper Body Bathing: Set up;Sitting   Lower Body Bathing: Min guard;Sit to/from stand   Upper Body Dressing : Set up;Sitting   Lower Body Dressing: Min guard;Sit to/from stand   Toilet Transfer: Minimal assistance;Grab  bars;Ambulation;Comfort height toilet   Toileting- Clothing Manipulation and Hygiene: Min guard;Sit to/from stand       Functional mobility during ADLs: Minimal assistance General ADL Comments: Pt with frequent LOB with mobility and transfers to the right side which was the same as his discharge from rehab.  Pt is at a wheelchair level at home secondary to this.  Will benefit from 24 hour supervision but he does not have it and refuses SNF at this time.     Vision Eye Alignment: Within Functional Limits     Tracking/Visual Pursuits: Decreased smoothness of horizontal tracking;Decreased smoothness of vertical tracking;Impaired - to be further tested in functional context;Other (comment) (Pt with bilateral jerky tracking in all fields.)             Perception Perception Perception Tested?: Yes Perception Deficits: Inattention/neglect Inattention/Neglect: Impaired- to be further tested in functional context   [place] tested?: Within functional limits    Pertinent Vitals/Pain Pain Assessment: No/denies pain     Hand Dominance Right   Extremity/Trunk Assessment Upper Extremity Assessment Upper Extremity Assessment: RUE deficits/detail RUE Deficits / Details: AROM shoulder flexion 0-120 degrees, Pt with AROM WFLS for all other joints.  Strength 3+/5 in the shoulder and 4/5 for elbow flexion/extension and for grasp.  Slight increased dysmetria noted but pt is able to use the UE functionally for  all selfcare tasks. RUE Sensation: decreased light touch;decreased proprioception (decreased proprioception in the right hand) RUE Coordination: decreased fine motor   Lower Extremity Assessment Lower Extremity Assessment: Defer to PT evaluation   Cervical / Trunk Assessment Cervical / Trunk Assessment: Kyphotic   Communication Communication Communication: Other (comment) (some dysarthria)   Cognition Arousal/Alertness: Lethargic Behavior During Therapy: WFL for tasks  assessed/performed Overall Cognitive Status: History of cognitive impairments - at baseline       Memory:  (Pt with decreased safety and decreased awareness)                        Home Living Family/patient expects to be discharged to:: Private residence Living Arrangements: Alone Available Help at Discharge: Available PRN/intermittently Type of Home: Apartment Home Access: Level entry     Home Layout: One level               Home Equipment: Walker - 2 wheels;Bedside commode;Tub bench;Wheelchair - Psychologist, educational;Other (comment) (bed rail; life alert) Adaptive Equipment: Reacher;Sock aid    Lives With: Alone    Prior Functioning/Environment Level of Independence: Independent with assistive device(s)        Comments: maneuvers at home from wheelchair level, assist for shower 2x/week, spnge bathes independent    OT Diagnosis: Generalized weakness;Hemiplegia dominant side;Cognitive deficits   OT Problem List: Decreased strength;Decreased activity tolerance;Impaired balance (sitting and/or standing);Impaired sensation;Impaired UE functional use   OT Treatment/Interventions: Self-care/ADL training;Neuromuscular education;Therapeutic activities;DME and/or AE instruction;Balance training;Patient/family education    OT Goals(Current goals can be found in the care plan section) Acute Rehab OT Goals Patient Stated Goal: To get strong again to go home Time For Goal Achievement: 04/05/14 Potential to Achieve Goals: Good  OT Frequency: Min 2X/week              End of Session Equipment Utilized During Treatment: Gait belt  Activity Tolerance: Patient tolerated treatment well Patient left: in chair;with call bell/phone within reach;with chair alarm set   Time: 9702-6378 OT Time Calculation (min): 29 min Charges:  OT General Charges $OT Visit: 1 Procedure OT Evaluation $Initial OT Evaluation Tier I: 1 Procedure OT Treatments $Self Care/Home  Management : 8-22 mins G-Codes: OT G-codes **NOT FOR INPATIENT CLASS** Functional Assessment Tool Used: clinical judgement Functional Limitation: Self care Self Care Current Status (H8850): At least 20 percent but less than 40 percent impaired, limited or restricted Self Care Goal Status (Y7741): At least 1 percent but less than 20 percent impaired, limited or restricted  Evian Salguero OTR/L 03/29/2014, 3:20 PM

## 2014-03-30 DIAGNOSIS — E119 Type 2 diabetes mellitus without complications: Secondary | ICD-10-CM

## 2014-03-30 LAB — GLUCOSE, CAPILLARY
GLUCOSE-CAPILLARY: 224 mg/dL — AB (ref 70–99)
Glucose-Capillary: 108 mg/dL — ABNORMAL HIGH (ref 70–99)
Glucose-Capillary: 135 mg/dL — ABNORMAL HIGH (ref 70–99)
Glucose-Capillary: 203 mg/dL — ABNORMAL HIGH (ref 70–99)
Glucose-Capillary: 165 mg/dL — ABNORMAL HIGH (ref 70–99)

## 2014-03-30 NOTE — Progress Notes (Signed)
[facility] PROGRESS NOTE  Maurice Stone [id number]:[id number] DOB: May 19, 1941 DOA: 03/28/2014 PCP: Garnett Farm, MD  Assessment/Plan: Worsening right side weakness, numbness extremities:  ? TIA, vs hypoglycemia.  CT head revealed old strokes.  MRI brain: negative for acute stroke.  Recently done MRA, Carotids and echocardiogram.  Hb A-1c 8.2  , LDL 88.  Continue with  plavix 75 mg daily.  Neurology sign off, neurology think weakness is from deconditioning.  Continue with gabapentin.   Hypertension:  SBP in the 110 range.  Hold Norvasc.  Continue with metoprolol.   Diabetes mellitus:  hgba1c 8. Hold lantus due to hypoglycemic episode.  Off D 5.  Monitor CBG, if CBG increases will start insulin.   Anemia :  Mild normocytic. Continue to monitor.   Mild thrombocytopenia:  No signs of bleeding.   Code Status: Full Code.  Family Communication:  Disposition Plan: to be determine. Monitor CBG, will need to start lantus when blood sugar increases.    Consultants:  Neurology  Procedures:  none  Antibiotics:  none  HPI/Subjective: Feeling well, no complaints. Does not wants to go to nursing home.  Numbness resolved.    Objective: Filed Vitals:   03/30/14 1352  BP: 120/64  Pulse: 52  Temp: 97.6 F (36.4 C)  Resp: 18    Intake/Output Summary (Last 24 hours) at 03/30/14 1443 Last data filed at 03/30/14 1307  Gross per 24 hour  Intake    480 ml  Output    700 ml  Net   -220 ml   Filed Weights   03/28/14 1455 03/28/14 1936  Weight: 97.523 kg (215 lb) 93.895 kg (207 lb)    Exam:   General:  Alert in no distress.   Cardiovascular: S 1, S 2 RRR  Respiratory: CTA  Abdomen: BS present, soft, NT  Musculoskeletal: trace edema.   Neuro: no significant weakness right side, 4-5/5  Data Reviewed: Basic Metabolic Panel:  Recent Labs Lab 03/28/14 1530  NA 141  K 4.0  CL 104  CO2 26  GLUCOSE 149*  BUN 11  CREATININE 1.10  CALCIUM 8.6    Liver Function Tests:  Recent Labs Lab 03/28/14 1530  AST 36  ALT 52  ALKPHOS 102  BILITOT 0.4  PROT 6.7  ALBUMIN 3.5   No results found for this basename: LIPASE, AMYLASE,  in the last 168 hours No results found for this basename: AMMONIA,  in the last 168 hours CBC:  Recent Labs Lab 03/28/14 1530  WBC 6.4  NEUTROABS 3.6  HGB 12.9*  HCT 38.1*  MCV 90.1  PLT 132*   Cardiac Enzymes: No results found for this basename: CKTOTAL, CKMB, CKMBINDEX, TROPONINI,  in the last 168 hours BNP (last 3 results) No results found for this basename: PROBNP,  in the last 8760 hours CBG:  Recent Labs Lab 03/29/14 1115 03/29/14 1637 03/29/14 2134 03/30/14 0612 03/30/14 1113  GLUCAP 175* 175* 177* 108* 135*    No results found for this or any previous visit (from the past 240 hour(s)).   Studies: Dg Chest 2 View  03/29/2014   CLINICAL DATA:  stroke  EXAM: CHEST - 2 VIEW  COMPARISON:  11/11/2000 by report only  FINDINGS: Heart size upper limits normal. Lungs clear. Tortuous thoracic aorta. No effusion. Visualized skeletal structures are unremarkable.  IMPRESSION: No acute cardiopulmonary disease.   Electronically Signed   By: Arne Cleveland M.D.   On: 03/29/2014 02:51   Ct Head (brain) Wo Contrast  03/28/2014   CLINICAL DATA:  Right-sided weakness.  EXAM: CT HEAD WITHOUT CONTRAST  TECHNIQUE: Contiguous axial images were obtained from the base of the skull through the vertex without intravenous contrast.  COMPARISON:  CT scan of January 15, 2014.  FINDINGS: Bony calvarium appears intact. Mild diffuse cortical atrophy is noted. Stable old left periventricular white matter infarction is noted. Mild chronic ischemic white matter disease is noted. Stable low density is noted in the pons consistent with old infarction. No mass effect or midline shift is noted. Ventricular size is within normal limits. There is no evidence of hemorrhage, acute infarction or mass lesion.  IMPRESSION: Mild diffuse  cortical atrophy. Mild chronic ischemic white matter disease. Old infarctions are noted in the left periventricular white matter and pontine region. No acute intracranial abnormality seen.   Electronically Signed   By: Sabino Dick M.D.   On: 03/28/2014 15:40   Mr Brain Wo Contrast  03/28/2014   CLINICAL DATA:  Stroke symptoms.  Now resolved.  EXAM: MRI HEAD WITHOUT CONTRAST  TECHNIQUE: Multiplanar, multiecho pulse sequences of the brain and surrounding structures were obtained without intravenous contrast.  COMPARISON:  None.  FINDINGS: No evidence for acute infarction, hemorrhage, mass lesion, hydrocephalus, or extra-axial fluid. Mild cerebral and cerebellar atrophy. Mild subcortical and periventricular T2 and FLAIR hyperintensities, likely chronic microvascular ischemic change. Remote deep white matter infarct affects the LEFT centrum semiovale and posterior basal ganglia. Remote lacunar infarcts also affect the LEFT pons. No evidence for acute infarct extension. No foci of chronic hemorrhage. Flow voids are maintained. Extracranial soft tissues unremarkable. Negative orbits. No acute sinus disease.  IMPRESSION: Chronic changes as described. No evidence for acute stroke or hemorrhage. Good general agreement with earlier CT.   Electronically Signed   By: Rolla Flatten M.D.   On: 03/28/2014 18:38    Scheduled Meds: . buPROPion  100 mg Oral BID  . clopidogrel  75 mg Oral Q breakfast  . donepezil  10 mg Oral QHS  . enoxaparin (LOVENOX) injection  40 mg Subcutaneous Q24H  . fenofibrate  160 mg Oral Daily  . gabapentin  600 mg Oral TID  . insulin aspart  0-9 Units Subcutaneous TID WC  . loratadine  10 mg Oral Daily  . metoprolol tartrate  25 mg Oral BID  . pantoprazole  40 mg Oral Daily  . QUEtiapine  100 mg Oral QHS  . sertraline  200 mg Oral Daily  . simvastatin  20 mg Oral q1800   Continuous Infusions:    Active Problems:   Essential hypertension, benign   chronic Right sided weakness   GERD  (gastroesophageal reflux disease)   Stroke   Diabetes mellitus   Numbness    Time spent: 25 minutes.     Niel Hummer A  [facility] Pager [phone]. If 7PM-7AM, please contact night-coverage at [email or web address], password [facility] 03/30/2014, 2:43 PM  LOS: 2 days

## 2014-03-30 NOTE — Progress Notes (Signed)
Occupational Therapy Treatment Patient Details Name: Maurice Stone MRN: [id number] DOB: [birth year] Today's Date: 03/30/2014    History of present illness Patient is a 73 y/o male admitted with numbness in hands and legs and dropping items in right hand.  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   OT comments  Pt is at baseline level for w/c level. OT to d/c from acute care at this time and recommend HHOT to follow. Pt self reports only deficit change is RT UE. Pt provided Rt UE putty HEP and pt with excellent return demo.   Follow Up Recommendations  Home health OT;Supervision/Assistance - 24 hour    Equipment Recommendations  None recommended by OT    Recommendations for Other Services      Precautions / Restrictions Precautions Precautions: Fall       Mobility Bed Mobility Overal bed mobility: Independent                Transfers Overall transfer level: Needs assistance Equipment used:  (w/c) Transfers: Sit to/from Stand;Stand Pivot Transfers Sit to Stand: Modified independent (Device/Increase time) Stand pivot transfers: Modified independent (Device/Increase time)       General transfer comment: Pt demonstrates baseline levle    Balance Overall balance assessment: Needs assistance         Standing balance support: Bilateral upper extremity supported;During functional activity Standing balance-Leahy Scale: Good Standing balance comment: Pt transferred from bed to w/c, w/c <>toilet, toilet<>w/c, w/c <> chair                   ADL Overall ADL's : Modified independent Eating/Feeding: Independent;Sitting   Grooming: Wash/dry hands;Modified independent (w/c level)                   Toilet Transfer: Modified Independent (w/c level)           Functional mobility during ADLs: Modified independent;Wheelchair General ADL Comments: Pt reports using w/c at home for all transfer and d/c from CIR at w/c  level. pt is back to baseline level for w/c use. OT to dc from acute care and recommend follow up at home level.Marland Kitchen Pt provided theraputty yellow to work on Rt hand strengthening. Pt demonstrates decr thumb strength. Pt with excellent return demo      Vision                 Additional Comments: wears glasses for reading   Perception     Praxis      Cognition   Behavior During Therapy: [facility] for tasks assessed/performed Overall Cognitive Status: History of cognitive impairments - at baseline                       Extremity/Trunk Assessment               Exercises     Shoulder Instructions       General Comments      Pertinent Vitals/ Pain          Home Living                                          Prior Functioning/Environment              Frequency Min 2X/week     Progress Toward Goals  OT Goals(current goals can  now be found in the care plan section)  Progress towards OT goals: Goals met/education completed, patient discharged from OT  Acute Rehab OT Goals Patient Stated Goal: To get strong again to go home Time For Goal Achievement: 04/05/14 Potential to Achieve Goals: Good ADL Goals Pt Will Perform Grooming: with modified independence;sitting Pt Will Perform Lower Body Bathing: with modified independence;sit to/from stand Pt Will Perform Lower Body Dressing: with modified independence;sit to/from stand Pt Will Transfer to Toilet: with modified independence;bedside commode;squat pivot transfer Pt Will Perform Toileting - Clothing Manipulation and hygiene: with modified independence;sit to/from stand Pt/caregiver will Perform Home Exercise Program: Right Upper extremity;With Supervision;With theraputty;With written HEP provided  Plan Discharge plan remains appropriate    Co-evaluation                 End of Session Equipment Utilized During Treatment: Gait belt   Activity Tolerance Patient tolerated  treatment well   Patient Left in chair;with call bell/phone within reach;with chair alarm set   Nurse Communication Mobility status;Precautions    Functional Assessment Tool Used: clinical judgement Functional Limitation: Self care Self Care Current Status (T3646): At least 1 percent but less than 20 percent impaired, limited or restricted Self Care Goal Status (O0321): At least 1 percent but less than 20 percent impaired, limited or restricted Self Care Discharge Status [phone]): At least 1 percent but less than 20 percent impaired, limited or restricted   Time: 5003-7048 OT Time Calculation (min): 25 min  Charges: OT G-codes **NOT FOR INPATIENT CLASS** Functional Assessment Tool Used: clinical judgement Functional Limitation: Self care Self Care Current Status (G8916): At least 1 percent but less than 20 percent impaired, limited or restricted Self Care Goal Status (X4503): At least 1 percent but less than 20 percent impaired, limited or restricted Self Care Discharge Status [phone]): At least 1 percent but less than 20 percent impaired, limited or restricted OT General Charges $OT Visit: 1 Procedure OT Treatments $Self Care/Home Management : 23-37 mins  Peri Maris 03/30/2014, 2:23 PM Pager: [phone]

## 2014-03-31 DIAGNOSIS — R209 Unspecified disturbances of skin sensation: Secondary | ICD-10-CM

## 2014-03-31 LAB — GLUCOSE, CAPILLARY
GLUCOSE-CAPILLARY: 225 mg/dL — AB (ref 70–99)
Glucose-Capillary: 185 mg/dL — ABNORMAL HIGH (ref 70–99)
Glucose-Capillary: 244 mg/dL — ABNORMAL HIGH (ref 70–99)
Glucose-Capillary: 180 mg/dL — ABNORMAL HIGH (ref 70–99)
Glucose-Capillary: 300 mg/dL — ABNORMAL HIGH (ref 70–99)

## 2014-03-31 MED ORDER — LOSARTAN POTASSIUM 100 MG PO TABS: 50.0000 mg | ORAL_TABLET | Freq: Every day | ORAL | Status: DC

## 2014-03-31 MED ORDER — INSULIN GLARGINE 100 UNIT/ML ~~LOC~~ SOLN: 10.0000 [IU] | Freq: Every day | SUBCUTANEOUS | Status: DC

## 2014-03-31 MED ORDER — INSULIN GLARGINE 100 UNIT/ML ~~LOC~~ SOLN
10.0000 [IU] | Freq: Every day | SUBCUTANEOUS | Status: DC
  Administered 2014-03-31 – 2014-04-01 (×2): 10 [IU] via SUBCUTANEOUS
  Filled 2014-03-31 (×2): qty 0.1

## 2014-03-31 MED ORDER — INSULIN ASPART 100 UNIT/ML ~~LOC~~ SOLN: SUBCUTANEOUS | Status: DC

## 2014-03-31 MED ORDER — INSULIN ASPART 100 UNIT/ML ~~LOC~~ SOLN
0.0000 [IU] | Freq: Three times a day (TID) | SUBCUTANEOUS | Status: DC
  Administered 2014-04-01: 3 [IU] via SUBCUTANEOUS
  Administered 2014-04-01: 5 [IU] via SUBCUTANEOUS

## 2014-03-31 MED ORDER — INSULIN ASPART 100 UNIT/ML ~~LOC~~ SOLN
0.0000 [IU] | Freq: Every day | SUBCUTANEOUS | Status: DC
  Administered 2014-04-01: 4 [IU] via SUBCUTANEOUS

## 2014-03-31 MED ORDER — QUETIAPINE FUMARATE 100 MG PO TABS: 100.0000 mg | ORAL_TABLET | Freq: Every day | ORAL | Status: DC

## 2014-03-31 NOTE — Progress Notes (Signed)
Sent message to [facility] NP regarding patient's CBG of 300 mg/dl.  Patient has no evening coverage at this time.

## 2014-03-31 NOTE — Progress Notes (Addendum)
[facility] PROGRESS NOTE  Maurice Stone [id number]:[id number] DOB: 04-Oct-1940 DOA: 03/28/2014 PCP: Garnett Farm, MD  Assessment/Plan: Worsening right side weakness, numbness extremities:  ? TIA, vs hypoglycemia.  CT head revealed old strokes.  MRI brain: negative for acute stroke.  Recently done MRA, Carotids and echocardiogram done June 2015.  Hb A-1c 8.2  , LDL 88.  Continue with  plavix 75 mg daily.  Continue with gabapentin.  Developed today numbness forearm and fingers, symptoms has improved. Discharge cancelled. Neuro informed. Patient feels symptoms has improved.   Hypertension:  Hold Norvasc.  Continue with metoprolol.   Diabetes mellitus:  hgba1c 8. Resume low dose lantus.  Monitor CBG, if CBG increases will start insulin.   Anemia :  Mild normocytic. Continue to monitor.   Mild thrombocytopenia:  No signs of bleeding.   Code Status: Full Code.  Family Communication:  Disposition Plan: to be determine. Monitor CBG, will need to start lantus when blood sugar increases.    Consultants:  Neurology  Procedures:  none  Antibiotics:  none  HPI/Subjective: Relates numbness  Right forearm , finger since , no change in motor strength. Discharge has been cancelled. Patient symptoms have improved.   Objective: Filed Vitals:   03/31/14 1408  BP: 130/61  Pulse: 59  Temp: 97.7 F (36.5 C)  Resp: 17    Intake/Output Summary (Last 24 hours) at 03/31/14 1630 Last data filed at 03/31/14 1554  Gross per 24 hour  Intake    240 ml  Output    375 ml  Net   -135 ml   Filed Weights   03/28/14 1455 03/28/14 1936  Weight: 97.523 kg (215 lb) 93.895 kg (207 lb)    Exam:   General:  Alert in no distress.   Cardiovascular: S 1, S 2 RRR  Respiratory: CTA  Abdomen: BS present, soft, NT  Musculoskeletal: trace edema.   Neuro: no significant weakness right side, 4-5/5  Data Reviewed: Basic Metabolic Panel:  Recent Labs Lab 03/28/14 1530  NA 141   K 4.0  CL 104  CO2 26  GLUCOSE 149*  BUN 11  CREATININE 1.10  CALCIUM 8.6   Liver Function Tests:  Recent Labs Lab 03/28/14 1530  AST 36  ALT 52  ALKPHOS 102  BILITOT 0.4  PROT 6.7  ALBUMIN 3.5   No results found for this basename: LIPASE, AMYLASE,  in the last 168 hours No results found for this basename: AMMONIA,  in the last 168 hours CBC:  Recent Labs Lab 03/28/14 1530  WBC 6.4  NEUTROABS 3.6  HGB 12.9*  HCT 38.1*  MCV 90.1  PLT 132*   Cardiac Enzymes: No results found for this basename: CKTOTAL, CKMB, CKMBINDEX, TROPONINI,  in the last 168 hours BNP (last 3 results) No results found for this basename: PROBNP,  in the last 8760 hours CBG:  Recent Labs Lab 03/30/14 1634 03/30/14 2221 03/31/14 0633 03/31/14 1155 03/31/14 1504  GLUCAP 203* 165* 185* 244* 225*    No results found for this or any previous visit (from the past 240 hour(s)).   Studies: No results found.  Scheduled Meds: . buPROPion  100 mg Oral BID  . clopidogrel  75 mg Oral Q breakfast  . donepezil  10 mg Oral QHS  . enoxaparin (LOVENOX) injection  40 mg Subcutaneous Q24H  . fenofibrate  160 mg Oral Daily  . gabapentin  600 mg Oral TID  . insulin aspart  0-9 Units Subcutaneous TID WC  .  insulin glargine  10 Units Subcutaneous Daily  . loratadine  10 mg Oral Daily  . metoprolol tartrate  25 mg Oral BID  . pantoprazole  40 mg Oral Daily  . QUEtiapine  100 mg Oral QHS  . sertraline  200 mg Oral Daily  . simvastatin  20 mg Oral q1800   Continuous Infusions:    Active Problems:   Essential hypertension, benign   chronic Right sided weakness   GERD (gastroesophageal reflux disease)   Stroke   Diabetes mellitus   Numbness    Time spent: 25 minutes.     Niel Hummer A  [facility] Pager [phone]. If 7PM-7AM, please contact night-coverage at [email or web address], password [facility] 03/31/2014, 4:30 PM  LOS: 3 days

## 2014-03-31 NOTE — Progress Notes (Signed)
Physical Therapy Treatment Patient Details Name: Maurice Stone MRN: [id number] DOB: Jul 17, 1941 Today's Date: 03/31/2014    History of Present Illness Patient is a 73 y/o male admitted with numbness in hands and legs and dropping items in right hand.  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    PT Comments    Pt very pleasant and moving well. Pt able to perform squat and stand pivot transfers without assist and states he mostly uses a WC in the home and is safe at this level of mobility. Pt able to walk in the hall today without buckling and encouraged continued ambulation with assist. Pt educated for HEP, performed and handout provided. Pt will benefit from continued therapy to maximize strength and function but per pt report of assist available at home feel he will be able to return home with HHPT.  Follow Up Recommendations  Home health PT;Supervision - Intermittent     Equipment Recommendations       Recommendations for Other Services       Precautions / Restrictions Precautions Precautions: Fall Precaution Comments: reports fell previously prior to Dx of CVA in June, but none recent    Mobility  Bed Mobility Overal bed mobility: Modified Independent Bed Mobility: Sit to Supine;Supine to Sit     Supine to sit: Modified independent (Device/Increase time) Sit to supine: Modified independent (Device/Increase time)   General bed mobility comments: increased time but no assist or cues needed  Transfers Overall transfer level: Modified independent   Transfers: Sit to/from Stand Sit to Stand: Modified independent (Device/Increase time) Stand pivot transfers: Modified independent (Device/Increase time)       General transfer comment: pt able to instruct placement of chair for pivot to and from bed, cues only for hand placement when sitting after using RW  Ambulation/Gait Ambulation/Gait assistance: Min guard Ambulation Distance  (Feet): 250 Feet Assistive device: Rolling walker (2 wheeled) Gait Pattern/deviations: Step-through pattern;Decreased stride length;Trunk flexed   Gait velocity interpretation: Below normal speed for age/gender General Gait Details: cues for posture and position in RW , no buckling   Stairs            Wheelchair Mobility    Modified Rankin (Stroke Patients Only)       Balance Overall balance assessment: Needs assistance   Sitting balance-Leahy Scale: Good       Standing balance-Leahy Scale: Fair                      Cognition Arousal/Alertness: Awake/alert Behavior During Therapy: WFL for tasks assessed/performed Overall Cognitive Status: History of cognitive impairments - at baseline                      Exercises General Exercises - Lower Extremity Long Arc Quad: AROM;Seated;Both;20 reps Hip ABduction/ADduction: AROM;Seated;Both;20 reps Hip Flexion/Marching: AROM;Seated;Both;20 reps Toe Raises: AROM;Seated;Both;20 reps Heel Raises: AROM;Seated;Both;20 reps    General Comments        Pertinent Vitals/Pain Pain Assessment: No/denies pain    Home Living                      Prior Function            PT Goals (current goals can now be found in the care plan section) Progress towards PT goals: Progressing toward goals    Frequency  Min 3X/week    PT Plan Discharge plan needs to be updated;Frequency  needs to be updated    Co-evaluation             End of Session Equipment Utilized During Treatment: Gait belt Activity Tolerance: Patient tolerated treatment well Patient left: in chair;with call bell/phone within reach     Time: 1145-1212 PT Time Calculation (min): 27 min  Charges:  $Gait Training: 8-22 mins $Therapeutic Exercise: 8-22 mins                    G Codes:      Melford Aase Apr 01, 2014, 12:17 PM Elwyn Reach, [place]

## 2014-03-31 NOTE — Progress Notes (Signed)
Subjective: Had transient right arm tingling.   Exam: Filed Vitals:   03/31/14 1850  BP:   Pulse: 65  Temp: 98.3 F (36.8 C)  Resp: 18   Gen: In bed, NAD MS: Awake, alert, interactive and appropriate.  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 Though not clear to me that this was a TIA, he did just have a workup and has had his risk factors addressed. I am not sure there is more to do from a stroke prevention standpoint at this time.   Recommendations: 1) Will continue to follow.   Roland Rack, MD [facility] [phone]  If 7pm- 7am, please page neurology on call as listed in [place].

## 2014-03-31 NOTE — Discharge Instructions (Signed)
STROKE/TIA DISCHARGE INSTRUCTIONS SMOKING Cigarette smoking nearly doubles your risk of having a stroke & is the single most alterable risk factor  If you smoke or have smoked in the last 12 months, you are advised to quit smoking for your health.  Most of the excess cardiovascular risk related to smoking disappears within a year of stopping.  Ask you doctor about anti-smoking medications  [place] Quit Line: 1-800-QUIT NOW  Free Smoking Cessation Classes [phone]  CHOLESTEROL Know your levels; limit fat & cholesterol in your diet  Lipid Panel     Component Value Date/Time   CHOL 174 03/29/2014 0501   TRIG 71 03/29/2014 0501   HDL 72 03/29/2014 0501   CHOLHDL 2.4 03/29/2014 0501   VLDL 14 03/29/2014 0501   LDLCALC 88 03/29/2014 0501      Many patients benefit from treatment even if their cholesterol is at goal.  Goal: Total Cholesterol (CHOL) less than 160  Goal:  Triglycerides (TRIG) less than 150  Goal:  HDL greater than 40  Goal:  LDL (LDLCALC) less than 100   BLOOD PRESSURE American Stroke Association blood pressure target is less that 120/80 mm/Hg  Your discharge blood pressure is:  BP: 131/69 mmHg  Monitor your blood pressure  Limit your salt and alcohol intake  Many individuals will require more than one medication for high blood pressure  DIABETES (A1c is a blood sugar average for last 3 months) Goal HGBA1c is under 7% (HBGA1c is blood sugar average for last 3 months)  Diabetes: Diagnosis of diabetes:  Your A1c:8.2 %    Lab Results  Component Value Date   HGBA1C 8.2* 03/29/2014     Your HGBA1c can be lowered with medications, healthy diet, and exercise.  Check your blood sugar as directed by your physician  Call your physician if you experience unexplained or low blood sugars.  PHYSICAL ACTIVITY/REHABILITATION Goal is 30 minutes at least 4 days per week  Activity: Increase activity slowly, Therapies: Physical Therapy: Home Health Return to work: N/A  Activity  decreases your risk of heart attack and stroke and makes your heart stronger.  It helps control your weight and blood pressure; helps you relax and can improve your mood.  Participate in a regular exercise program.  Talk with your doctor about the best form of exercise for you (dancing, walking, swimming, cycling).  DIET/WEIGHT Goal is to maintain a healthy weight  Your discharge diet is: Carb Control thin liquids Your height is:  Height: 5\' 11"  (180.3 cm) Your current weight is: Weight: 93.895 kg (207 lb) Your Body Mass Index (BMI) is:  BMI (Calculated): 28.9  Following the type of diet specifically designed for you will help prevent another stroke.  Your goal weight range is:  135-185  Your goal Body Mass Index (BMI) is 19-24.  Healthy food habits can help reduce 3 risk factors for stroke:  High cholesterol, hypertension, and excess weight.  RESOURCES Stroke/Support Group:  Call [phone]   STROKE EDUCATION PROVIDED/REVIEWED AND GIVEN TO PATIENT Stroke warning signs and symptoms How to activate emergency medical system (call 911). Medications prescribed at discharge. Need for follow-up after discharge. Personal risk factors for stroke. Pneumonia vaccine given: No Flu vaccine given: No My questions have been answered, the writing is legible, and I understand these instructions.  I will adhere to these goals & educational materials that have been provided to me after my discharge from the hospital.

## 2014-03-31 NOTE — Progress Notes (Signed)
Utilization Review Completed.   Dakoda Bassette, RN, BSN Nurse Case Manager  

## 2014-03-31 NOTE — Discharge Summary (Addendum)
Physician Discharge Summary  [name] WVP:[id number] DOB: 09/24/1940 DOA: 03/28/2014  PCP: Garnett Farm, MD  Admit date: 03/28/2014 Discharge date: 03/31/2014  Time spent: 35 minutes  Recommendations for Outpatient Follow-up:  1. Needs further adjustment of insulin regimen.  2. Stroke risk factors modifications.  Discharge Diagnoses:    Deconditioning.    Hypoglycemia   Essential hypertension, benign   chronic Right sided weakness   GERD (gastroesophageal reflux disease)   Stroke   Diabetes mellitus   Numbness   Discharge Condition: stable.   Diet recommendation: Carb modified.   Filed Weights   03/28/14 1455 03/28/14 1936  Weight: 97.523 kg (215 lb) 93.895 kg (207 lb)    History of present illness:  Maurice Stone is a 73 y.o. male with recent h/o CVA hypertension, diabetes mellitus with neuropathy, COPD, anemia, comes in for worsening numbness i nthe legs and hands and frequent dropping things from his right hand. He came to ED thinking he might have another stroke. On arrival to ED, he appears comfortable and initial CT head did not reveal any acute intra cranial pathology. Neurology was consulted and an MRI brain was ordered for further evaluation.    Hospital Course:  Worsening right side weakness, numbness extremities:  ? TIA, vs hypoglycemia.  CT head revealed old strokes.  MRI brain: negative for acute stroke.  Recently done MRA, Carotids and echocardiogram.  Hb A-1c 8.2 , LDL 88.  Continue with plavix 75 mg daily.  Neurology sign off, neurology think weakness is from deconditioning. Continue with gabapentin.   Hypertension:  SBP in the 110 range.  Hold Norvasc.  Continue with metoprolol.   Diabetes mellitus:  hgba1c 8.  Patient had hypoglycemic event. His insulin was held. He was treated with D 5 IV fluids.  When hypoglycemia resolved , he was started on lantus 12 units and 2 units of meal coverage,   Anemia :  Mild normocytic. Continue to  monitor.  Mild thrombocytopenia:  No signs of bleeding.  His Seroquel was decrease because patient reported adverse effect,   Procedures:  none  Consultations:  neurology  Discharge Exam: Filed Vitals:   03/31/14 0940  BP: 131/69  Pulse: 51  Temp:   Resp:     General: Alert in no distress  Cardiovascular: S 1, S 2 RRR Respiratory: CTA  Discharge Instructions You were cared for by a hospitalist during your hospital stay. If you have any questions about your discharge medications or the care you received while you were in the hospital after you are discharged, you can call the unit and asked to speak with the hospitalist on call if the hospitalist that took care of you is not available. Once you are discharged, your primary care physician will handle any further medical issues. Please note that NO REFILLS for any discharge medications will be authorized once you are discharged, as it is imperative that you return to your primary care physician (or establish a relationship with a primary care physician if you do not have one) for your aftercare needs so that they can reassess your need for medications and monitor your lab values.   Current Discharge Medication List    CONTINUE these medications which have CHANGED   Details  insulin aspart (NOVOLOG) 100 UNIT/ML injection 2 units with meals three times a day if blood sugar more than 180 prior to meals. Qty: 10 mL, Refills: 0    insulin glargine (LANTUS) 100 UNIT/ML injection Inject 0.1 mLs (10  Units total) into the skin daily. Qty: 10 mL, Refills: 11    losartan (COZAAR) 100 MG tablet Take 0.5 tablets (50 mg total) by mouth daily. Qty: 30 tablet, Refills: 0    QUEtiapine (SEROQUEL) 100 MG tablet Take 1 tablet (100 mg total) by mouth at bedtime. Qty: 30 tablet, Refills: 0      CONTINUE these medications which have NOT CHANGED   Details  buPROPion (WELLBUTRIN) 100 MG tablet Take 1 tablet (100 mg total) by mouth 2 (two) times  daily. Qty: 60 tablet, Refills: 1    Cholecalciferol 2000 UNITS CAPS Take 4,000 Units by mouth daily.    clopidogrel (PLAVIX) 75 MG tablet Take 1 tablet (75 mg total) by mouth daily. Qty: 30 tablet, Refills: 1    donepezil (ARICEPT) 10 MG tablet Take 1 tablet (10 mg total) by mouth at bedtime. Qty: 30 tablet, Refills: 1    fenofibrate 160 MG tablet Take 1 tablet (160 mg total) by mouth daily. Qty: 30 tablet, Refills: 1    gabapentin (NEURONTIN) 600 MG tablet Take 1 tablet (600 mg total) by mouth 3 (three) times daily. Qty: 90 tablet, Refills: 1    HYDROcodone-acetaminophen (NORCO/VICODIN) 5-325 MG per tablet Take 2 tablets by mouth every 6 (six) hours as needed for moderate pain. Qty: 60 tablet, Refills: 0    loratadine (CLARITIN) 10 MG tablet Take 10 mg by mouth daily.    metoprolol tartrate (LOPRESSOR) 25 MG tablet Take 1 tablet (25 mg total) by mouth 2 (two) times daily. Qty: 60 tablet, Refills: 1    pantoprazole (PROTONIX) 40 MG tablet Take 1 tablet (40 mg total) by mouth daily. Qty: 30 tablet, Refills: 1    pravastatin (PRAVACHOL) 20 MG tablet Take 1 tablet (20 mg total) by mouth daily. Qty: 30 tablet, Refills: 1    sertraline (ZOLOFT) 100 MG tablet Take 2 tablets (200 mg total) by mouth daily. Qty: 30 tablet, Refills: 1    albuterol (PROVENTIL HFA;VENTOLIN HFA) 108 (90 BASE) MCG/ACT inhaler Inhale 1 puff into the lungs every 6 (six) hours as needed for wheezing or shortness of breath. Qty: 1 Inhaler, Refills: 0      STOP taking these medications     amLODipine (NORVASC) 10 MG tablet        Allergies  Allergen Reactions  . Lisinopril Cough      The results of significant diagnostics from this hospitalization (including imaging, microbiology, ancillary and laboratory) are listed below for reference.    Significant Diagnostic Studies: Dg Chest 2 View  03/29/2014   CLINICAL DATA:  stroke  EXAM: CHEST - 2 VIEW  COMPARISON:  11/11/2000 by report only  FINDINGS:  Heart size upper limits normal. Lungs clear. Tortuous thoracic aorta. No effusion. Visualized skeletal structures are unremarkable.  IMPRESSION: No acute cardiopulmonary disease.   Electronically Signed   By: Arne Cleveland M.D.   On: 03/29/2014 02:51   Ct Head (brain) Wo Contrast  03/28/2014   CLINICAL DATA:  Right-sided weakness.  EXAM: CT HEAD WITHOUT CONTRAST  TECHNIQUE: Contiguous axial images were obtained from the base of the skull through the vertex without intravenous contrast.  COMPARISON:  CT scan of January 15, 2014.  FINDINGS: Bony calvarium appears intact. Mild diffuse cortical atrophy is noted. Stable old left periventricular white matter infarction is noted. Mild chronic ischemic white matter disease is noted. Stable low density is noted in the pons consistent with old infarction. No mass effect or midline shift is noted. Ventricular size is  within normal limits. There is no evidence of hemorrhage, acute infarction or mass lesion.  IMPRESSION: Mild diffuse cortical atrophy. Mild chronic ischemic white matter disease. Old infarctions are noted in the left periventricular white matter and pontine region. No acute intracranial abnormality seen.   Electronically Signed   By: Sabino Dick M.D.   On: 03/28/2014 15:40   Mr Brain Wo Contrast  03/28/2014   CLINICAL DATA:  Stroke symptoms.  Now resolved.  EXAM: MRI HEAD WITHOUT CONTRAST  TECHNIQUE: Multiplanar, multiecho pulse sequences of the brain and surrounding structures were obtained without intravenous contrast.  COMPARISON:  None.  FINDINGS: No evidence for acute infarction, hemorrhage, mass lesion, hydrocephalus, or extra-axial fluid. Mild cerebral and cerebellar atrophy. Mild subcortical and periventricular T2 and FLAIR hyperintensities, likely chronic microvascular ischemic change. Remote deep white matter infarct affects the LEFT centrum semiovale and posterior basal ganglia. Remote lacunar infarcts also affect the LEFT pons. No evidence for  acute infarct extension. No foci of chronic hemorrhage. Flow voids are maintained. Extracranial soft tissues unremarkable. Negative orbits. No acute sinus disease.  IMPRESSION: Chronic changes as described. No evidence for acute stroke or hemorrhage. Good general agreement with earlier CT.   Electronically Signed   By: Rolla Flatten M.D.   On: 03/28/2014 18:38    Microbiology: No results found for this or any previous visit (from the past 240 hour(s)).   Labs: Basic Metabolic Panel:  Recent Labs Lab 03/28/14 1530  NA 141  K 4.0  CL 104  CO2 26  GLUCOSE 149*  BUN 11  CREATININE 1.10  CALCIUM 8.6   Liver Function Tests:  Recent Labs Lab 03/28/14 1530  AST 36  ALT 52  ALKPHOS 102  BILITOT 0.4  PROT 6.7  ALBUMIN 3.5   No results found for this basename: LIPASE, AMYLASE,  in the last 168 hours No results found for this basename: AMMONIA,  in the last 168 hours CBC:  Recent Labs Lab 03/28/14 1530  WBC 6.4  NEUTROABS 3.6  HGB 12.9*  HCT 38.1*  MCV 90.1  PLT 132*   Cardiac Enzymes: No results found for this basename: CKTOTAL, CKMB, CKMBINDEX, TROPONINI,  in the last 168 hours BNP: BNP (last 3 results) No results found for this basename: PROBNP,  in the last 8760 hours CBG:  Recent Labs Lab 03/30/14 1113 03/30/14 1632 03/30/14 1634 03/30/14 2221 03/31/14 0633  GLUCAP 135* 224* 203* 165* 185*       Signed:  Yacqub Baston A  [facility] 03/31/2014, 9:54 AM

## 2014-03-31 NOTE — Progress Notes (Signed)
Pt called nursing staff stating that he is experiencing new sensation of tingling to the right forearm. CBG= 225 . VS are WNL. Attending physician and neurologist came to evaluate pt. No new order given at this time. Will continue to monitor pt. Ferdinand Lango, RN

## 2014-04-01 LAB — GLUCOSE, CAPILLARY
GLUCOSE-CAPILLARY: 262 mg/dL — AB (ref 70–99)
GLUCOSE-CAPILLARY: 336 mg/dL — AB (ref 70–99)
Glucose-Capillary: 219 mg/dL — ABNORMAL HIGH (ref 70–99)

## 2014-04-01 MED ORDER — INSULIN GLARGINE 100 UNIT/ML ~~LOC~~ SOLN: 12.0000 [IU] | Freq: Every day | SUBCUTANEOUS | Status: DC

## 2014-04-01 NOTE — Progress Notes (Signed)
Pt reports to sensory deficits in RUE this am.  MD aware, will monitor.

## 2014-04-01 NOTE — Progress Notes (Signed)
UR complete.  Hershal Eriksson RN, MSN 

## 2014-04-01 NOTE — Care Management Note (Addendum)
  Page 2 of 2   04/01/2014     2:26:45 PM CARE MANAGEMENT NOTE 04/01/2014  Patient:  Maurice Stone, Maurice Stone   Account Number:  [id number]  Date Initiated:  03/29/2014  Documentation initiated by:  Olga Coaster  Subjective/Objective Assessment:   ADMITTED FOR STROKE WORK UP     Action/Plan:   CM FOLLOWING FOR DCP   Anticipated DC Date:  03/31/2014   Anticipated DC Plan:  [place]  CM consult      Choice offered to / List presented to:  C-1 Patient        [place] arranged  HH-1 RN  [place].   Status of service:  Completed, signed off Medicare Important Message given?   (If response is "NO", the following Medicare IM given date fields will be blank) Date Medicare IM given:   Medicare IM given by:   Date Additional Medicare IM given:   Additional Medicare IM given by:    Discharge Disposition:  [place]  Per UR Regulation:  Reviewed for med. necessity/level of care/duration of stay  If discussed at [place] of Stay Meetings, dates discussed:    Comments:  04/01/14 Gene Autry, MSN, CM- Patient is active with [facility]. Mary with [facility] was notified of discharge/ new home health orders.    03/29/2014- Received call from [facility] with [place]- pt is managing well at home with their [facility] services but needs a [facility] because he is not taking his medication correctly especially his psych meds; Attending MD at discharge please order HHRN/ PT/ OT/ SW; Aneta Mins [phone]

## 2014-04-01 NOTE — Progress Notes (Signed)
Discharge orders received, pt for discharge home today with home health PT per [facility], IV and telemetry D/C,.  D/C instructions and Rx given with verbalized understanding.  Family at bedside to assist pt with discharge. Staff brought pt downstairs via wheelchair.  

## 2014-04-01 NOTE — Progress Notes (Signed)
Physical Therapy Treatment Patient Details Name: ZEPPELIN COMMISSO MRN: [id number] DOB: 05/31/1941 Today's Date: 04/01/2014    History of Present Illness Patient is a 73 y/o male admitted with numbness in hands and legs and dropping items in right hand.  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    PT Comments    Patient making good progress. Planning to DC home later today. Patient safe to D/C from a mobility standpoint based on progression towards goals set on PT eval.    Follow Up Recommendations  Home health PT;Supervision - Intermittent     Equipment Recommendations  None recommended by PT    Recommendations for Other Services       Precautions / Restrictions Precautions Precautions: Fall Precaution Comments: reports fell previously prior to Dx of CVA in June, but none recent    Mobility  Bed Mobility                  Transfers Overall transfer level: Modified independent                  Ambulation/Gait Ambulation/Gait assistance: Supervision Ambulation Distance (Feet): 300 Feet Assistive device: Rolling walker (2 wheeled)       General Gait Details: cues for posture and position in RW , no buckling   Stairs            Wheelchair Mobility    Modified Rankin (Stroke Patients Only) Modified Rankin (Stroke Patients Only) Pre-Morbid Rankin Score: Moderately severe disability Modified Rankin: Moderately severe disability     Balance                                    Cognition Arousal/Alertness: Awake/alert Behavior During Therapy: WFL for tasks assessed/performed Overall Cognitive Status: History of cognitive impairments - at baseline                      Exercises      General Comments        Pertinent Vitals/Pain Pain Assessment: No/denies pain    Home Living                      Prior Function            PT Goals (current goals can now be found in  the care plan section) Progress towards PT goals: Progressing toward goals    Frequency  Min 3X/week    PT Plan Current plan remains appropriate    Co-evaluation             End of Session Equipment Utilized During Treatment: Gait belt Activity Tolerance: Patient tolerated treatment well Patient left: in chair;with call bell/phone within reach;with chair alarm set     Time: 1415-1431 PT Time Calculation (min): 16 min  Charges:  $Gait Training: 8-22 mins                    G Codes:      Jacqualyn Posey 04/01/2014, 2:40 PM 04/01/2014 Jacqualyn Posey PTA [phone] pager [phone] office

## 2014-04-01 NOTE — Progress Notes (Signed)
Pt belongings found in the room after discharge, 2 gold rings, a gold bracelet, and a life alert necklace.  Son was called, reports that the patient's daughter will pick up later tonight.  Belongings given to day shift charge Therapist, sports.

## 2014-04-01 NOTE — Progress Notes (Signed)
Subjective: No further events Exam: Filed Vitals:   04/01/14 1442  BP: 142/78  Pulse: 56  Temp: 98.9 F (37.2 C)  Resp: 20   Gen: In bed, NAD MS: Awake, alert, interactive and appropriate.  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 Though not clear to me that this was a TIA, he did just have a workup and has had his risk factors addressed. I am not sure there is more to do from a stroke prevention standpoint at this time.   Recommendations: 1) no further recommendations at this time, neurology will sign off.  Roland Rack, MD [facility] [phone]  If 7pm- 7am, please page neurology on call as listed in [place].

## 2014-04-02 LAB — GLUCOSE, CAPILLARY: Glucose-Capillary: 35 mg/dL — CL (ref 70–99)

## 2014-04-02 NOTE — Progress Notes (Signed)
PT G-code Note (Late Entry)  03/29/14 1047  PT G-Codes **NOT FOR INPATIENT CLASS**  Functional Assessment Tool Used clinical observation  Functional Limitation Mobility: Walking and moving around  Mobility: Walking and Moving Around Current Status [phone]) CJ  Mobility: Walking and Moving Around Goal Status [phone]) Tempie Donning, [place] 04/02/2014

## 2014-04-15 DIAGNOSIS — I10 Essential (primary) hypertension: Secondary | ICD-10-CM

## 2014-04-15 DIAGNOSIS — Z8673 Personal history of transient ischemic attack (TIA), and cerebral infarction without residual deficits: Secondary | ICD-10-CM

## 2014-04-15 DIAGNOSIS — F329 Major depressive disorder, single episode, unspecified: Secondary | ICD-10-CM

## 2014-04-15 DIAGNOSIS — E119 Type 2 diabetes mellitus without complications: Secondary | ICD-10-CM

## 2014-04-15 DIAGNOSIS — F3289 Other specified depressive episodes: Secondary | ICD-10-CM

## 2014-04-15 DIAGNOSIS — I69959 Hemiplegia and hemiparesis following unspecified cerebrovascular disease affecting unspecified side: Secondary | ICD-10-CM

## 2014-04-15 DIAGNOSIS — J449 Chronic obstructive pulmonary disease, unspecified: Secondary | ICD-10-CM

## 2014-04-15 DIAGNOSIS — I2510 Atherosclerotic heart disease of native coronary artery without angina pectoris: Secondary | ICD-10-CM

## 2014-04-15 DIAGNOSIS — Z794 Long term (current) use of insulin: Secondary | ICD-10-CM

## 2014-05-15 ENCOUNTER — Telehealth: Payer: Self-pay | Admitting: *Deleted

## 2014-05-15 NOTE — Telephone Encounter (Signed)
Mrs. Maurice Stone from [place] needs verbal order to extend care.  [phone]

## 2014-05-15 NOTE — Telephone Encounter (Signed)
This was address - verbal orders were given by Wenda Overland

## 2015-02-02 ENCOUNTER — Emergency Department (HOSPITAL_BASED_OUTPATIENT_CLINIC_OR_DEPARTMENT_OTHER): Payer: Non-veteran care

## 2015-02-02 ENCOUNTER — Inpatient Hospital Stay (HOSPITAL_COMMUNITY): Payer: Non-veteran care

## 2015-02-02 ENCOUNTER — Encounter (HOSPITAL_BASED_OUTPATIENT_CLINIC_OR_DEPARTMENT_OTHER): Payer: Self-pay | Admitting: *Deleted

## 2015-02-02 ENCOUNTER — Inpatient Hospital Stay (HOSPITAL_BASED_OUTPATIENT_CLINIC_OR_DEPARTMENT_OTHER)
Admission: EM | Admit: 2015-02-02 | Discharge: 2015-02-03 | DRG: 057 | Disposition: A | Discharge status: Home / Self Care | Payer: Non-veteran care | Attending: Family Medicine | Admitting: Family Medicine

## 2015-02-02 DIAGNOSIS — M069 Rheumatoid arthritis, unspecified: Secondary | ICD-10-CM | POA: Diagnosis present

## 2015-02-02 DIAGNOSIS — E1165 Type 2 diabetes mellitus with hyperglycemia: Secondary | ICD-10-CM | POA: Diagnosis present

## 2015-02-02 DIAGNOSIS — E1140 Type 2 diabetes mellitus with diabetic neuropathy, unspecified: Secondary | ICD-10-CM | POA: Diagnosis present

## 2015-02-02 DIAGNOSIS — E119 Type 2 diabetes mellitus without complications: Secondary | ICD-10-CM

## 2015-02-02 DIAGNOSIS — I10 Essential (primary) hypertension: Secondary | ICD-10-CM | POA: Diagnosis present

## 2015-02-02 DIAGNOSIS — R200 Anesthesia of skin: Secondary | ICD-10-CM | POA: Diagnosis present

## 2015-02-02 DIAGNOSIS — Z888 Allergy status to other drugs, medicaments and biological substances status: Secondary | ICD-10-CM | POA: Diagnosis not present

## 2015-02-02 DIAGNOSIS — Z87891 Personal history of nicotine dependence: Secondary | ICD-10-CM

## 2015-02-02 DIAGNOSIS — Z79899 Other long term (current) drug therapy: Secondary | ICD-10-CM

## 2015-02-02 DIAGNOSIS — I2510 Atherosclerotic heart disease of native coronary artery without angina pectoris: Secondary | ICD-10-CM | POA: Diagnosis present

## 2015-02-02 DIAGNOSIS — K219 Gastro-esophageal reflux disease without esophagitis: Secondary | ICD-10-CM | POA: Diagnosis present

## 2015-02-02 DIAGNOSIS — Z96652 Presence of left artificial knee joint: Secondary | ICD-10-CM | POA: Diagnosis present

## 2015-02-02 DIAGNOSIS — R739 Hyperglycemia, unspecified: Secondary | ICD-10-CM | POA: Diagnosis present

## 2015-02-02 DIAGNOSIS — Z79891 Long term (current) use of opiate analgesic: Secondary | ICD-10-CM | POA: Diagnosis not present

## 2015-02-02 DIAGNOSIS — J449 Chronic obstructive pulmonary disease, unspecified: Secondary | ICD-10-CM | POA: Diagnosis present

## 2015-02-02 DIAGNOSIS — G959 Disease of spinal cord, unspecified: Secondary | ICD-10-CM | POA: Diagnosis present

## 2015-02-02 DIAGNOSIS — Z794 Long term (current) use of insulin: Secondary | ICD-10-CM

## 2015-02-02 DIAGNOSIS — G629 Polyneuropathy, unspecified: Secondary | ICD-10-CM

## 2015-02-02 DIAGNOSIS — R29810 Facial weakness: Secondary | ICD-10-CM | POA: Diagnosis present

## 2015-02-02 DIAGNOSIS — Z8249 Family history of ischemic heart disease and other diseases of the circulatory system: Secondary | ICD-10-CM

## 2015-02-02 DIAGNOSIS — R531 Weakness: Secondary | ICD-10-CM | POA: Diagnosis present

## 2015-02-02 DIAGNOSIS — I639 Cerebral infarction, unspecified: Secondary | ICD-10-CM | POA: Diagnosis present

## 2015-02-02 DIAGNOSIS — Z833 Family history of diabetes mellitus: Secondary | ICD-10-CM | POA: Diagnosis not present

## 2015-02-02 DIAGNOSIS — I69351 Hemiplegia and hemiparesis following cerebral infarction affecting right dominant side: Principal | ICD-10-CM

## 2015-02-02 DIAGNOSIS — Z7902 Long term (current) use of antithrombotics/antiplatelets: Secondary | ICD-10-CM

## 2015-02-02 DIAGNOSIS — G459 Transient cerebral ischemic attack, unspecified: Secondary | ICD-10-CM | POA: Insufficient documentation

## 2015-02-02 LAB — CBC
HEMATOCRIT: 41.5 % (ref 39.0–52.0)
HEMATOCRIT: 39.7 % (ref 39.0–52.0)
HEMOGLOBIN: 14.1 g/dL (ref 13.0–17.0)
HEMOGLOBIN: 13.8 g/dL (ref 13.0–17.0)
MCH: 30.6 pg (ref 26.0–34.0)
MCH: 30.6 pg (ref 26.0–34.0)
MCHC: 34 g/dL (ref 30.0–36.0)
MCHC: 34.8 g/dL (ref 30.0–36.0)
MCV: 90 fL (ref 78.0–100.0)
MCV: 88 fL (ref 78.0–100.0)
Platelets: 136 10*3/uL — ABNORMAL LOW (ref 150–400)
Platelets: 122 10*3/uL — ABNORMAL LOW (ref 150–400)
RBC: 4.61 MIL/uL (ref 4.22–5.81)
RBC: 4.51 MIL/uL (ref 4.22–5.81)
RDW: 14.4 % (ref 11.5–15.5)
RDW: 14.2 % (ref 11.5–15.5)
WBC: 7.8 10*3/uL (ref 4.0–10.5)
WBC: 7.2 10*3/uL (ref 4.0–10.5)

## 2015-02-02 LAB — CREATININE, SERUM
CREATININE: 0.88 mg/dL (ref 0.61–1.24)
GFR calc Af Amer: >60 mL/min (ref >60)
GFR calc non Af Amer: >60 mL/min (ref >60)

## 2015-02-02 LAB — COMPREHENSIVE METABOLIC PANEL
ALBUMIN: 4.3 g/dL (ref 3.5–5.0)
ALK PHOS: 100 U/L (ref 38–126)
ALT: 42 U/L (ref 17–63)
AST: 38 U/L (ref 15–41)
Anion gap: 12 (ref 5–15)
BILIRUBIN TOTAL: 0.7 mg/dL (ref 0.3–1.2)
BUN: 12 mg/dL (ref 6–20)
CHLORIDE: 101 mmol/L (ref 101–111)
CO2: 24 mmol/L (ref 22–32)
Calcium: 9.5 mg/dL (ref 8.9–10.3)
Creatinine, Ser: 0.98 mg/dL (ref 0.61–1.24)
Glucose, Bld: 388 mg/dL — ABNORMAL HIGH (ref 65–99)
Potassium: 3.8 mmol/L (ref 3.5–5.1)
Sodium: 137 mmol/L (ref 135–145)
Total Protein: 7.4 g/dL (ref 6.5–8.1)

## 2015-02-02 LAB — URINALYSIS, ROUTINE W REFLEX MICROSCOPIC
Bilirubin Urine: NEGATIVE
Glucose, UA: >1000 mg/dL — AB
HGB URINE DIPSTICK: NEGATIVE
Ketones, ur: NEGATIVE mg/dL
Leukocytes, UA: NEGATIVE
NITRITE: NEGATIVE
Protein, ur: NEGATIVE mg/dL
Specific Gravity, Urine: 1.034 — ABNORMAL HIGH (ref 1.005–1.030)
UROBILINOGEN UA: 0.2 mg/dL (ref 0.0–1.0)
pH: 5.5 (ref 5.0–8.0)

## 2015-02-02 LAB — URINE MICROSCOPIC-ADD ON

## 2015-02-02 LAB — GLUCOSE, CAPILLARY
GLUCOSE-CAPILLARY: 187 mg/dL — AB (ref 65–99)
GLUCOSE-CAPILLARY: 226 mg/dL — AB (ref 65–99)

## 2015-02-02 LAB — TROPONIN I

## 2015-02-02 LAB — CBG MONITORING, ED: GLUCOSE-CAPILLARY: 384 mg/dL — AB (ref 65–99)

## 2015-02-02 MED ORDER — LOSARTAN POTASSIUM 50 MG PO TABS: 50.0000 mg | ORAL_TABLET | Freq: Every day | ORAL | Status: DC

## 2015-02-02 MED ORDER — FENOFIBRATE 160 MG PO TABS
160.0000 mg | ORAL_TABLET | Freq: Every day | ORAL | Status: DC
  Administered 2015-02-03: 160 mg via ORAL
  Filled 2015-02-02: qty 1

## 2015-02-02 MED ORDER — GABAPENTIN 300 MG PO CAPS
600.0000 mg | ORAL_CAPSULE | Freq: Three times a day (TID) | ORAL | Status: DC
  Administered 2015-02-02 – 2015-02-03 (×2): 600 mg via ORAL
  Filled 2015-02-02 (×2): qty 2

## 2015-02-02 MED ORDER — GABAPENTIN 600 MG PO TABS: 600.0000 mg | ORAL_TABLET | Freq: Three times a day (TID) | ORAL | Status: DC

## 2015-02-02 MED ORDER — PRAVASTATIN SODIUM 20 MG PO TABS: 20.0000 mg | ORAL_TABLET | Freq: Every day | ORAL | Status: DC

## 2015-02-02 MED ORDER — SERTRALINE HCL 100 MG PO TABS
200.0000 mg | ORAL_TABLET | Freq: Every day | ORAL | Status: DC
  Administered 2015-02-03: 200 mg via ORAL
  Filled 2015-02-02: qty 2

## 2015-02-02 MED ORDER — BUPROPION HCL 100 MG PO TABS
100.0000 mg | ORAL_TABLET | Freq: Two times a day (BID) | ORAL | Status: DC
  Administered 2015-02-02 – 2015-02-03 (×2): 100 mg via ORAL
  Filled 2015-02-02 (×3): qty 1

## 2015-02-02 MED ORDER — PANTOPRAZOLE SODIUM 40 MG PO TBEC
40.0000 mg | DELAYED_RELEASE_TABLET | Freq: Every day | ORAL | Status: DC
  Administered 2015-02-03: 40 mg via ORAL
  Filled 2015-02-02: qty 1

## 2015-02-02 MED ORDER — INSULIN GLARGINE 100 UNIT/ML ~~LOC~~ SOLN
12.0000 [IU] | Freq: Every day | SUBCUTANEOUS | Status: DC
  Administered 2015-02-03: 12 [IU] via SUBCUTANEOUS
  Filled 2015-02-02: qty 0.12

## 2015-02-02 MED ORDER — METOPROLOL TARTRATE 25 MG PO TABS
25.0000 mg | ORAL_TABLET | Freq: Two times a day (BID) | ORAL | Status: DC
  Administered 2015-02-03: 25 mg via ORAL
  Filled 2015-02-02 (×2): qty 1

## 2015-02-02 MED ORDER — QUETIAPINE FUMARATE 50 MG PO TABS
100.0000 mg | ORAL_TABLET | Freq: Every day | ORAL | Status: DC
  Administered 2015-02-02: 100 mg via ORAL
  Filled 2015-02-02: qty 2

## 2015-02-02 MED ORDER — HEPARIN SODIUM (PORCINE) 5000 UNIT/ML IJ SOLN
5000.0000 [IU] | Freq: Three times a day (TID) | INTRAMUSCULAR | Status: DC
  Administered 2015-02-02 – 2015-02-03 (×3): 5000 [IU] via SUBCUTANEOUS
  Filled 2015-02-02 (×3): qty 1

## 2015-02-02 MED ORDER — LOSARTAN POTASSIUM 50 MG PO TABS
50.0000 mg | ORAL_TABLET | Freq: Every day | ORAL | Status: DC
  Administered 2015-02-03: 50 mg via ORAL
  Filled 2015-02-02: qty 1

## 2015-02-02 MED ORDER — STROKE: EARLY STAGES OF RECOVERY BOOK: Freq: Once | Status: DC

## 2015-02-02 MED ORDER — HYDROCODONE-ACETAMINOPHEN 5-325 MG PO TABS
2.0000 | ORAL_TABLET | Freq: Four times a day (QID) | ORAL | Status: DC | PRN
  Administered 2015-02-03: 2 via ORAL
  Filled 2015-02-02: qty 2

## 2015-02-02 MED ORDER — INSULIN ASPART 100 UNIT/ML ~~LOC~~ SOLN
0.0000 [IU] | Freq: Three times a day (TID) | SUBCUTANEOUS | Status: DC
  Administered 2015-02-03: 8 [IU] via SUBCUTANEOUS
  Administered 2015-02-03: 2 [IU] via SUBCUTANEOUS

## 2015-02-02 MED ORDER — DONEPEZIL HCL 10 MG PO TABS
10.0000 mg | ORAL_TABLET | Freq: Every day | ORAL | Status: DC
  Administered 2015-02-02: 10 mg via ORAL
  Filled 2015-02-02: qty 1

## 2015-02-02 MED ORDER — CLOPIDOGREL BISULFATE 75 MG PO TABS
75.0000 mg | ORAL_TABLET | Freq: Every day | ORAL | Status: DC
  Administered 2015-02-03: 75 mg via ORAL
  Filled 2015-02-02: qty 1

## 2015-02-02 NOTE — Consult Note (Signed)
Neurology Consultation Reason for Consult: numbness Referring Physician: Nada Libman  CC: Arm numbness  History is obtained from: Patient  HPI: AGAMJOT KILGALLON is a 74 y.o. male with a history of previous strokes who presents with new arm numbness that is affected both sides. He states that on Friday he started noticing that his left arm was numb and then the next morning his entire left side was numb. Then about 2 days later, he noticed that his right arm was numb as well. This has improved, but his right arm still feels slightly numb. Of note, he has had both left and right numbness with strokes in the past and has hyperglycemia on presentation today.   LKW: Friday tpa given?: no, out of window    ROS: A 14 point ROS was performed and is negative except as noted in the HPI.   Past Medical History  Diagnosis Date  . Hypertension   . Coronary artery disease   . COPD (chronic obstructive pulmonary disease)   . Stroke   . Refusal of blood transfusions as patient is Jehovah's Witness   . Diabetes mellitus without complication     insulin dependent   . GERD (gastroesophageal reflux disease)   . Arthritis     RA    Family History:  hypertension, diabetes social History: Tob: Denies  Exam: Current vital signs: BP 126/76 mmHg  Pulse 76  Temp(Src) 98 F (36.7 C) (Oral)  Resp 18  Ht 5\' 11"  (1.803 m)  Wt 88.451 kg (195 lb)  BMI 27.21 kg/m2  SpO2 98% Vital signs in last 24 hours: Temp:  [97.5 F (36.4 C)-99.5 F (37.5 C)] 98 F (36.7 C) (07/10 2023) Pulse Rate:  [63-76] 76 (07/10 2023) Resp:  [18] 18 (07/10 2023) BP: (126-147)/(76-89) 126/76 mmHg (07/10 2023) SpO2:  [95 %-100 %] 98 % (07/10 2023) Weight:  [88.451 kg (195 lb)] 88.451 kg (195 lb) (07/10 1039)   Physical Exam  Constitutional: Appears well-developed and well-nourished.  Psych: Affect appropriate to situation Eyes: No scleral injection HENT: No OP obstrucion Head: Normocephalic.  Cardiovascular:  Normal rate and regular rhythm.  Respiratory: Effort normal and breath sounds normal to anterior ascultation GI: Soft.  No distension. There is no tenderness.  Skin: WDI  Neuro: Mental Status: Patient is awake, alert, interactive and appropriate  Patient is able to give a clear and coherent history. No signs of aphasia or neglect Cranial Nerves: II: Visual Fields are full. Pupils are equal, round, and reactive to light.   III,IV, VI: EOMI without ptosis or diploplia.  V: Facial sensation is symmetric to temperature VII: Facial movement is symmetric.  VIII: hearing is intact to voice X: Uvula elevates symmetrically XI: Shoulder shrug is symmetric. XII: tongue is midline without atrophy or fasciculations.  Motor: Tone is normal. Bulk is normal. 5/5 strength was present on the left side, on the right he has 4/5 weakness of the arm and leg.  Sensory: Sensation is mildly diminished on the right.  Deep Tendon Reflexes: 2+ and symmetric in the biceps and  absent at the patellae.  Cerebellar: FNFintact bilaterally         I have reviewed labs in epic and the results pertinent to this consultation are:  CMT-elevated glucose   I have reviewed the images obtained: CT head-negative acute  Impression: 74 year old male with bilateral numb arms.Possibilities would include 2 separate ischemic events, which I think is relatively unlikely, cervical myelopathy with myelopathic findings obscured by peripheral neuropathy, unmasking  of previous deficits from some physiological stressor.  With the duration of his symptoms, if this was due to cerebral ischemia I would expect change on the MRI, if it is negative then I do not think that a full repeat workup is needed.  Recommendation 1) MRI brain, C-spine  2)  further recommendations following above imaging.   Roland Rack, MD [facility] [phone]  If 7pm- 7am, please page neurology on call as listed in [place].

## 2015-02-02 NOTE — ED Notes (Addendum)
Pt c/o increased weakness to right side and facial droop x 3 days , HX of TIA x 1 year ago

## 2015-02-02 NOTE — ED Notes (Signed)
Report given to [facility]  
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
## 2015-02-02 NOTE — Progress Notes (Signed)
Patient recd to room from ambulance. Patient oriented to unit and safety plan. MD paged upon arrival. Tele Box 21 called to central monitoring. Patient is A/Ox4

## 2015-02-02 NOTE — ED Notes (Signed)
MD at bedside. 

## 2015-02-02 NOTE — ED Notes (Signed)
[facility] here to transport patient to [facility].
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
## 2015-02-02 NOTE — ED Notes (Addendum)
Ever Gustafson, wife, [phone].

## 2015-02-03 LAB — LIPID PANEL
Cholesterol: 151 mg/dL (ref 0–200)
HDL: 46 mg/dL (ref >40)
LDL CALC: 74 mg/dL (ref 0–99)
Total CHOL/HDL Ratio: 3.3 RATIO
Triglycerides: 153 mg/dL — ABNORMAL HIGH (ref <150)
VLDL: 31 mg/dL (ref 0–40)

## 2015-02-03 LAB — GLUCOSE, CAPILLARY
GLUCOSE-CAPILLARY: 281 mg/dL — AB (ref 65–99)
Glucose-Capillary: 146 mg/dL — ABNORMAL HIGH (ref 65–99)

## 2015-02-03 NOTE — Progress Notes (Signed)
Subjective: Continues to have bilateral hand numbness. No new symptoms.    Objective: Current vital signs: BP 119/66 mmHg  Pulse 79  Temp(Src) 98 F (36.7 C) (Oral)  Resp 16  Ht 5\' 11"  (1.803 m)  Wt 88.451 kg (195 lb)  BMI 27.21 kg/m2  SpO2 97% Vital signs in last 24 hours: Temp:  [97.5 F (36.4 C)-99.5 F (37.5 C)] 98 F (36.7 C) (07/11 0953) Pulse Rate:  [58-79] 79 (07/11 0953) Resp:  [16-18] 16 (07/11 0953) BP: (119-147)/(64-89) 119/66 mmHg (07/11 0953) SpO2:  [95 %-100 %] 97 % (07/11 0953)  Intake/Output from previous day:   Intake/Output this shift:   Nutritional status:    Neurologic Exam:  Mental Status: Alert, oriented, thought content appropriate.  Speech fluent without evidence of aphasia.  Able to follow 3 step commands without difficulty. Cranial Nerves: II:  Visual fields grossly normal, pupils equal, round, reactive to light and accommodation III,IV, VI: ptosis not present, extra-ocular motions intact bilaterally V,VII: smile symmetric, facial light touch sensation normal bilaterally VIII: hearing normal bilaterally IX,X: uvula rises symmetrically XI: bilateral shoulder shrug XII: midline tongue extension without atrophy or fasciculations  Motor: Right : Upper extremity   5/5    Left:     Upper extremity   5/5  Lower extremity   5/5     Lower extremity   5/5 Tone and bulk:normal tone throughout; no atrophy noted Sensory: intact throughout with subjective feeling of "pins and needles" bilateral " hands. Positive tinnel's on the right.  Deep Tendon Reflexes:  Right: Upper Extremity   Left: Upper extremity   biceps (C-5 to C-6) 2/4   biceps (C-5 to C-6) 2/4 tricep (C7) 2/4    triceps (C7) 2/4 Brachioradialis (C6) 2/4  Brachioradialis (C6) 2/4  Lower Extremity Lower Extremity  quadriceps (L-2 to L-4) 2/4   quadriceps (L-2 to L-4) 2/4 Achilles (S1) 2/4   Achilles (S1) 2/4  Plantars: Right: downgoing   Left: downgoing Cerebellar: normal  finger-to-nose,  normal heel-to-shin test    Lab Results: Basic Metabolic Panel:  Recent Labs Lab 02/02/15 1041 02/02/15 1909  NA 137  --   K 3.8  --   CL 101  --   CO2 24  --   GLUCOSE 388*  --   BUN 12  --   CREATININE 0.98 0.88  CALCIUM 9.5  --     Liver Function Tests:  Recent Labs Lab 02/02/15 1041  AST 38  ALT 42  ALKPHOS 100  BILITOT 0.7  PROT 7.4  ALBUMIN 4.3   No results for input(s): LIPASE, AMYLASE in the last 168 hours. No results for input(s): AMMONIA in the last 168 hours.  CBC:  Recent Labs Lab 02/02/15 1041 02/02/15 1909  WBC 7.8 7.2  HGB 14.1 13.8  HCT 41.5 39.7  MCV 90.0 88.0  PLT 136* 122*    Cardiac Enzymes:  Recent Labs Lab 02/02/15 1041  TROPONINI <0.03    Lipid Panel:  Recent Labs Lab 02/03/15 0633  CHOL 151  TRIG 153*  HDL 46  CHOLHDL 3.3  VLDL 31  LDLCALC 74    CBG:  Recent Labs Lab 02/02/15 1043 02/02/15 1851 02/02/15 2117 02/03/15 0636 02/03/15 1122  GLUCAP 384* 187* 226* 146* 281*    Microbiology: No results found for this or any previous visit.  Coagulation Studies: No results for input(s): LABPROT, INR in the last 72 hours.  Imaging: Dg Chest 2 View  02/02/2015   CLINICAL DATA:  Right-sided  numbness for 2 days.  EXAM: CHEST  2 VIEW  COMPARISON:  03/28/2014 chest radiograph  FINDINGS: The cardiomediastinal silhouette is unremarkable.  There is no evidence of focal airspace disease, pulmonary edema, suspicious pulmonary nodule/mass, pleural effusion, or pneumothorax. No acute bony abnormalities are identified.  IMPRESSION: No active cardiopulmonary disease.   Electronically Signed   By: Margarette Canada M.D.   On: 02/02/2015 11:52   Ct Head Wo Contrast  02/02/2015   CLINICAL DATA:  Three day history of right-sided weakness and unsteady gait  EXAM: CT HEAD WITHOUT CONTRAST  TECHNIQUE: Contiguous axial images were obtained from the base of the skull through the vertex without intravenous contrast.   COMPARISON:  Head CT 03/28/2014  FINDINGS: Stable age related cerebral atrophy, ventriculomegaly and periventricular white matter disease. No extra-axial fluid collections are identified. No CT findings for acute hemispheric infarction or intracranial hemorrhage. Remote lacunar type infarct noted in the left caudate area. No mass lesions. The brainstem and cerebellum are normal.  The bony structures are intact. The paranasal sinuses and mastoid air cells are clear. Globes are intact.  IMPRESSION: Stable age related cerebral atrophy, ventriculomegaly and periventricular white matter disease. There is also a remote lacunar type infarct in the left caudate area.  No acute intracranial findings or mass lesion.   Electronically Signed   By: Marijo Sanes M.D.   On: 02/02/2015 11:44   Mri Brain Without Contrast  02/03/2015   CLINICAL DATA:  New bilateral upper extremity and LEFT body numbness, improving. History of extremity numbness from prior strokes. History of diabetes, hypertension.  EXAM: MRI HEAD WITHOUT CONTRAST  MRA HEAD WITHOUT CONTRAST  MRI CERVICAL SPINE WITHOUT CONTRAST  TECHNIQUE: Multiplanar, multiecho pulse sequences of the brain and surrounding structures, and cervical spine, to include the craniocervical junction and cervicothoracic junction, were obtained without intravenous contrast. Angiographic images of the head were obtained using MRA technique without contrast. MIP images provided.  COMPARISON:  CT head February 02, 2015 at 11:08 a.m. and MRI of the brain March 28, 2014  FINDINGS: MRI HEAD FINDINGS  No reduced diffusion to suggest acute ischemia. No susceptibility artifact to suggest hemorrhage.  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 The ventricles and sulci are otherwise normal for patient's age. Patchy supratentorial, pontine white matter T2 hyperintensities mildly progressed from prior MRI (LEFT frontal lobe, axial 15/24).  No midline shift, mass effect or mass lesions.  No abnormal extra-axial fluid collections. Ocular globes and orbital contents are normal though, not tailored for evaluation. Trace ethmoid mucosal thickening without paranasal sinus air-fluid levels. Mastoid air cells are well aerated. No abnormal sellar expansion. No cerebellar tonsillar ectopia. No suspicious calvarial bone marrow signal.  MRA HEAD FINDINGS  Anterior circulation: Normal flow related enhancement of the included cervical, petrous, cavernous and supra clinoid internal carotid arteries. Patent anterior communicating artery. Patent anterior and middle cerebral arteries, including more distal segments. Mild stenosis proximal RIGHT M1 segment.  No large vessel occlusion, high-grade stenosis, aneurysm. Moderate luminal irregularity of the intracranial vessels.  Posterior circulation: LEFT vertebral artery is dominant. Basilar artery is patent, with normal flow related enhancement of the main branch vessels. Mild to moderate stenosis mid to distal basilar artery. Patent posterior cerebral arteries, moderate to high-grade stenosis RIGHT P2 origin. Robust RIGHT, small LEFT posterior communicating artery is present.  No large vessel occlusion, aneurysm. Moderate luminal irregularity of the intracranial vessels.  MRI CERVICAL SPINE FINDINGS  Cervical vertebral  bodies and posterior elements are intact and aligned. Maintenance of cervical lordosis. Moderate C5-6, C6-7 disc height loss. Remaining disc morphology is maintained. Decreased T2 signal within all cervical disc consistent mild desiccation. Moderate chronic discogenic endplate changes R1-5 and C6-7. No STIR signal abnormality to suggest acute osseous process.  Cervical spinal cord appears normal morphology and signal characteristics from the craniocervical junction to at least the level of T1-2, the most caudal well visualized level. Cranial cervical junction maintained. Included prevertebral and  paraspinal soft tissues are nonsuspicious.  Level by level evaluation (mildly motion degraded axial sequences limit evaluation):  C2-3: C2-3: Small broad-based disc bulge, uncovertebral hypertrophy and mild to moderate facet arthropathy without canal stenosis. Moderate RIGHT neural foraminal narrowing.  C3-4: 2 mm broad-based disc bulge, uncovertebral hypertrophy and mild facet arthropathy. Mild canal stenosis. Moderate neural foraminal narrowing.  C4-5: Annular bulging, uncovertebral hypertrophy. No canal stenosis or neural foraminal narrowing.  C5-6: 3 mm broad-based disc bulge asymmetric to the LEFT. Uncovertebral hypertrophy. Mild to moderate canal stenosis. Moderate RIGHT, severe LEFT neural foraminal narrowing.  C6-7: 2 mm broad-based disc bulge, uncovertebral hypertrophy. Mild to moderate canal stenosis. Moderate RIGHT greater LEFT neural foraminal narrowing.  C7-T1: Annular bulging. No canal stenosis or neural foraminal narrowing. Subcentimeter LEFT C8 perineural cyst.  IMPRESSION: MRI HEAD: No acute intracranial process, specifically no acute ischemia.  Progressed white matter changes compatible with moderate chronic small vessel ischemic disease. 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  MRA HEAD: No large vessel occlusion. Moderate to high-grade stenosis RIGHT P2 origin. Mild to moderate stenosis basilar artery.  Moderate luminal irregularity of the intracranial vessels most consistent with chronic small vessel ischemic disease.  MRI CERVICAL SPINE: No acute fracture or malalignment. Normal noncontrast MRI of the cervical spinal cord.  Degenerative cervical spine resulting in mild to moderate canal stenosis C5-6 and C6-7, mild at C3-4.  Neural foraminal narrowing C3-4, C5-6 and C6-7: Severe on the LEFT at C5-6.   Electronically Signed   By: Elon Alas M.D.   On: 02/03/2015 00:19   Mr Cervical Spine Wo Contrast  02/03/2015   CLINICAL DATA:  New bilateral upper extremity and LEFT  body numbness, improving. History of extremity numbness from prior strokes. History of diabetes, hypertension.  EXAM: MRI HEAD WITHOUT CONTRAST  MRA HEAD WITHOUT CONTRAST  MRI CERVICAL SPINE WITHOUT CONTRAST  TECHNIQUE: Multiplanar, multiecho pulse sequences of the brain and surrounding structures, and cervical spine, to include the craniocervical junction and cervicothoracic junction, were obtained without intravenous contrast. Angiographic images of the head were obtained using MRA technique without contrast. MIP images provided.  COMPARISON:  CT head February 02, 2015 at 11:08 a.m. and MRI of the brain March 28, 2014  FINDINGS: MRI HEAD FINDINGS  No reduced diffusion to suggest acute ischemia. No susceptibility artifact to suggest hemorrhage.  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 The ventricles and sulci are otherwise normal for patient's age. Patchy supratentorial, pontine white matter T2 hyperintensities mildly progressed from prior MRI (LEFT frontal lobe, axial 15/24). No midline shift, mass effect or mass lesions.  No abnormal extra-axial fluid collections. Ocular globes and orbital contents are normal though, not tailored for evaluation. Trace ethmoid mucosal thickening without paranasal sinus air-fluid levels. Mastoid air cells are well aerated. No abnormal sellar expansion. No cerebellar tonsillar ectopia. No suspicious calvarial bone marrow signal.  MRA HEAD FINDINGS  Anterior circulation: Normal flow related enhancement of the included cervical,  petrous, cavernous and supra clinoid internal carotid arteries. Patent anterior communicating artery. Patent anterior and middle cerebral arteries, including more distal segments. Mild stenosis proximal RIGHT M1 segment.  No large vessel occlusion, high-grade stenosis, aneurysm. Moderate luminal irregularity of the intracranial vessels.  Posterior circulation: LEFT vertebral artery is  dominant. Basilar artery is patent, with normal flow related enhancement of the main branch vessels. Mild to moderate stenosis mid to distal basilar artery. Patent posterior cerebral arteries, moderate to high-grade stenosis RIGHT P2 origin. Robust RIGHT, small LEFT posterior communicating artery is present.  No large vessel occlusion, aneurysm. Moderate luminal irregularity of the intracranial vessels.  MRI CERVICAL SPINE FINDINGS  Cervical vertebral bodies and posterior elements are intact and aligned. Maintenance of cervical lordosis. Moderate C5-6, C6-7 disc height loss. Remaining disc morphology is maintained. Decreased T2 signal within all cervical disc consistent mild desiccation. Moderate chronic discogenic endplate changes H2-0 and C6-7. No STIR signal abnormality to suggest acute osseous process.  Cervical spinal cord appears normal morphology and signal characteristics from the craniocervical junction to at least the level of T1-2, the most caudal well visualized level. Cranial cervical junction maintained. Included prevertebral and paraspinal soft tissues are nonsuspicious.  Level by level evaluation (mildly motion degraded axial sequences limit evaluation):  C2-3: C2-3: Small broad-based disc bulge, uncovertebral hypertrophy and mild to moderate facet arthropathy without canal stenosis. Moderate RIGHT neural foraminal narrowing.  C3-4: 2 mm broad-based disc bulge, uncovertebral hypertrophy and mild facet arthropathy. Mild canal stenosis. Moderate neural foraminal narrowing.  C4-5: Annular bulging, uncovertebral hypertrophy. No canal stenosis or neural foraminal narrowing.  C5-6: 3 mm broad-based disc bulge asymmetric to the LEFT. Uncovertebral hypertrophy. Mild to moderate canal stenosis. Moderate RIGHT, severe LEFT neural foraminal narrowing.  C6-7: 2 mm broad-based disc bulge, uncovertebral hypertrophy. Mild to moderate canal stenosis. Moderate RIGHT greater LEFT neural foraminal narrowing.  C7-T1:  Annular bulging. No canal stenosis or neural foraminal narrowing. Subcentimeter LEFT C8 perineural cyst.  IMPRESSION: MRI HEAD: No acute intracranial process, specifically no acute ischemia.  Progressed white matter changes compatible with moderate chronic small vessel ischemic disease. 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  MRA HEAD: No large vessel occlusion. Moderate to high-grade stenosis RIGHT P2 origin. Mild to moderate stenosis basilar artery.  Moderate luminal irregularity of the intracranial vessels most consistent with chronic small vessel ischemic disease.  MRI CERVICAL SPINE: No acute fracture or malalignment. Normal noncontrast MRI of the cervical spinal cord.  Degenerative cervical spine resulting in mild to moderate canal stenosis C5-6 and C6-7, mild at C3-4.  Neural foraminal narrowing C3-4, C5-6 and C6-7: Severe on the LEFT at C5-6.   Electronically Signed   By: Elon Alas M.D.   On: 02/03/2015 00:19   Mr Jodene Nam Head/brain Wo Cm  02/03/2015   CLINICAL DATA:  New bilateral upper extremity and LEFT body numbness, improving. History of extremity numbness from prior strokes. History of diabetes, hypertension.  EXAM: MRI HEAD WITHOUT CONTRAST  MRA HEAD WITHOUT CONTRAST  MRI CERVICAL SPINE WITHOUT CONTRAST  TECHNIQUE: Multiplanar, multiecho pulse sequences of the brain and surrounding structures, and cervical spine, to include the craniocervical junction and cervicothoracic junction, were obtained without intravenous contrast. Angiographic images of the head were obtained using MRA technique without contrast. MIP images provided.  COMPARISON:  CT head February 02, 2015 at 11:08 a.m. and MRI of the brain March 28, 2014  FINDINGS: MRI HEAD FINDINGS  No reduced diffusion to suggest acute ischemia. No susceptibility artifact to suggest  hemorrhage.  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 The ventricles and sulci are  otherwise normal for patient's age. Patchy supratentorial, pontine white matter T2 hyperintensities mildly progressed from prior MRI (LEFT frontal lobe, axial 15/24). No midline shift, mass effect or mass lesions.  No abnormal extra-axial fluid collections. Ocular globes and orbital contents are normal though, not tailored for evaluation. Trace ethmoid mucosal thickening without paranasal sinus air-fluid levels. Mastoid air cells are well aerated. No abnormal sellar expansion. No cerebellar tonsillar ectopia. No suspicious calvarial bone marrow signal.  MRA HEAD FINDINGS  Anterior circulation: Normal flow related enhancement of the included cervical, petrous, cavernous and supra clinoid internal carotid arteries. Patent anterior communicating artery. Patent anterior and middle cerebral arteries, including more distal segments. Mild stenosis proximal RIGHT M1 segment.  No large vessel occlusion, high-grade stenosis, aneurysm. Moderate luminal irregularity of the intracranial vessels.  Posterior circulation: LEFT vertebral artery is dominant. Basilar artery is patent, with normal flow related enhancement of the main branch vessels. Mild to moderate stenosis mid to distal basilar artery. Patent posterior cerebral arteries, moderate to high-grade stenosis RIGHT P2 origin. Robust RIGHT, small LEFT posterior communicating artery is present.  No large vessel occlusion, aneurysm. Moderate luminal irregularity of the intracranial vessels.  MRI CERVICAL SPINE FINDINGS  Cervical vertebral bodies and posterior elements are intact and aligned. Maintenance of cervical lordosis. Moderate C5-6, C6-7 disc height loss. Remaining disc morphology is maintained. Decreased T2 signal within all cervical disc consistent mild desiccation. Moderate chronic discogenic endplate changes E4-9 and C6-7. No STIR signal abnormality to suggest acute osseous process.  Cervical spinal cord appears normal morphology and signal characteristics from the  craniocervical junction to at least the level of T1-2, the most caudal well visualized level. Cranial cervical junction maintained. Included prevertebral and paraspinal soft tissues are nonsuspicious.  Level by level evaluation (mildly motion degraded axial sequences limit evaluation):  C2-3: C2-3: Small broad-based disc bulge, uncovertebral hypertrophy and mild to moderate facet arthropathy without canal stenosis. Moderate RIGHT neural foraminal narrowing.  C3-4: 2 mm broad-based disc bulge, uncovertebral hypertrophy and mild facet arthropathy. Mild canal stenosis. Moderate neural foraminal narrowing.  C4-5: Annular bulging, uncovertebral hypertrophy. No canal stenosis or neural foraminal narrowing.  C5-6: 3 mm broad-based disc bulge asymmetric to the LEFT. Uncovertebral hypertrophy. Mild to moderate canal stenosis. Moderate RIGHT, severe LEFT neural foraminal narrowing.  C6-7: 2 mm broad-based disc bulge, uncovertebral hypertrophy. Mild to moderate canal stenosis. Moderate RIGHT greater LEFT neural foraminal narrowing.  C7-T1: Annular bulging. No canal stenosis or neural foraminal narrowing. Subcentimeter LEFT C8 perineural cyst.  IMPRESSION: MRI HEAD: No acute intracranial process, specifically no acute ischemia.  Progressed white matter changes compatible with moderate chronic small vessel ischemic disease. 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  MRA HEAD: No large vessel occlusion. Moderate to high-grade stenosis RIGHT P2 origin. Mild to moderate stenosis basilar artery.  Moderate luminal irregularity of the intracranial vessels most consistent with chronic small vessel ischemic disease.  MRI CERVICAL SPINE: No acute fracture or malalignment. Normal noncontrast MRI of the cervical spinal cord.  Degenerative cervical spine resulting in mild to moderate canal stenosis C5-6 and C6-7, mild at C3-4.  Neural foraminal narrowing C3-4, C5-6 and C6-7: Severe on the LEFT at C5-6.   Electronically Signed    By: Elon Alas M.D.   On: 02/03/2015 00:19    Medications:  Scheduled: .  stroke: mapping our early stages of recovery  book   Does not apply Once  . buPROPion  100 mg Oral BID  . clopidogrel  75 mg Oral Daily  . donepezil  10 mg Oral QHS  . fenofibrate  160 mg Oral Daily  . gabapentin  600 mg Oral TID  . heparin  5,000 Units Subcutaneous 3 times per day  . insulin aspart  0-15 Units Subcutaneous TID WC  . insulin glargine  12 Units Subcutaneous Daily  . losartan  50 mg Oral Daily  . metoprolol tartrate  25 mg Oral BID  . pantoprazole  40 mg Oral Daily  . pravastatin  20 mg Oral q1800  . QUEtiapine  100 mg Oral QHS  . sertraline  200 mg Oral Daily   Etta Quill PA-C [facility] [phone]  02/03/2015, 12:21 PM   Assessment/Plan: 74 YO male with bilateral arm numbness. MRI brain personally reviewed and is negative for acute CVA.  MRI of the cervical spine shows degenerative changes resulting in mild to moderate spinal stenosis at C3-4, C5-7.  Severe left sided foraminal narrowing at C5-6.  This does not completely explain the patient's symptoms.  Can not rule out CTS.    Recommendations: 1.  Continue Plavix 2.  No further neurologic intervention is recommended at this time.  Patient may continue work up on an outpatient basis.   If further questions arise, please call or page at that time.  Thank you for allowing neurology to participate in the care of this patient.  Alexis Goodell, MD [facility] [phone] 02/03/2015  12:58 PM

## 2015-02-03 NOTE — Evaluation (Signed)
Physical Therapy Evaluation Patient Details Name: Maurice Stone MRN: [id number] DOB: Dec 03, 1940 Today's Date: 02/03/2015   History of Present Illness  Patient is a 74 y/o male admitted with numbness in bilateral hands. PMH includes HTN, DM, COPD + CVA~ 2002.MRI brain negative for acute CVA. MRI of the cervical spine shows degenerative changes resulting in mild to moderate spinal stenosis at C3-4, C5-7. Severe left sided foraminal narrowing at C5-6.   Clinical Impression  Patient presents with numbness/tingling Bil hands R>L which has resolved since this morning. Pt with difficulty opening objects with right hand and with buttons/zippers. Pt with + Tinel's test on right concerning for possible median nerve compression. Weakness noted on RUE. Educated pt on safety re: using RW for all mobility due to mild balance deficits. Instructed pt to document when numbness/tingling arises, what position he is in and duration prior to follow up with neurologist. Recommend OPPT for possible nerve compression, adaptive equipment and weakness RUE. Pt to d/c today. If still in hospital, will continue to follow to maximize independence.     Follow Up Recommendations Outpatient PT (for UEs.)    Equipment Recommendations  None recommended by PT    Recommendations for Other Services OT consult     Precautions / Restrictions Precautions Precautions: Fall Restrictions Weight Bearing Restrictions: No      Mobility  Bed Mobility Overal bed mobility: Needs Assistance Bed Mobility: Supine to Sit;Sit to Supine     Supine to sit: Modified independent (Device/Increase time) Sit to supine: Modified independent (Device/Increase time);HOB elevated      Transfers Overall transfer level: Needs assistance Equipment used: Rolling walker (2 wheeled) Transfers: Sit to/from Stand Sit to Stand: Supervision         General transfer comment: Supervision for safety.   Ambulation/Gait Ambulation/Gait  assistance: Min guard Ambulation Distance (Feet): 150 Feet Assistive device: Rolling walker (2 wheeled) Gait Pattern/deviations: Step-through pattern;Decreased stride length;Trunk flexed   Gait velocity interpretation: Below normal speed for age/gender General Gait Details: Pt with slow, steady gait with use of RW. Veering right inside RW.  Stairs            Wheelchair Mobility    Modified Rankin (Stroke Patients Only)       Balance Overall balance assessment: Needs assistance Sitting-balance support: Feet supported;No upper extremity supported Sitting balance-Leahy Scale: Good     Standing balance support: During functional activity Standing balance-Leahy Scale: Poor Standing balance comment: Relient on RW.                              Pertinent Vitals/Pain Pain Assessment: No/denies pain    Home Living Family/patient expects to be discharged to:: Private residence Living Arrangements: Spouse/significant other   Type of Home: Apartment Home Access: Level entry     Home Layout: One level Home Equipment: Bedside commode;Wheelchair - [employer] - 4 wheels      Prior Function Level of Independence: Independent with assistive device(s)         Comments: Pt furniture walks in the home. Uses rollator for community ambulation. Pt reports bumping into walls and furniture on right side.     Hand Dominance   Dominant Hand: Right    Extremity/Trunk Assessment   Upper Extremity Assessment: Defer to OT evaluation;RUE deficits/detail (pt with + tinel test on RUE. (-) phalens and (-) reverse phalen's.  ) RUE Deficits / Details: Limited AROM shoulder elevation. Grossly ~3/5 biceps, triceps.  Lower Extremity Assessment: RLE deficits/detail;LLE deficits/detail         Communication   Communication: No difficulties  Cognition Arousal/Alertness: Awake/alert Behavior During Therapy: WFL for tasks assessed/performed Overall Cognitive  Status: Within Functional Limits for tasks assessed                      General Comments General comments (skin integrity, edema, etc.): Pt's wife present in room during evaluation. Pt with difficulty opening tops of objects within room due to decreased grip strength.    Exercises        Assessment/Plan    PT Assessment Patient needs continued PT services  PT Diagnosis Generalized weakness   PT Problem List Decreased strength;Decreased range of motion;Decreased activity tolerance;Decreased balance;Decreased mobility  PT Treatment Interventions Balance training;Gait training;Functional mobility training;Therapeutic activities;Therapeutic exercise;Patient/family education   PT Goals (Current goals can be found in the Care Plan section) Acute Rehab PT Goals Patient Stated Goal: to be able to use my right hand better PT Goal Formulation: With patient Time For Goal Achievement: 02/17/15 Potential to Achieve Goals: Good    Frequency Min 3X/week   Barriers to discharge        Co-evaluation               End of Session Equipment Utilized During Treatment: Gait belt Activity Tolerance: Patient tolerated treatment well Patient left: in bed;with call bell/phone within reach;with family/visitor present Nurse Communication: Mobility status         Time: 9924-2683 PT Time Calculation (min) (ACUTE ONLY): 31 min   Charges:   PT Evaluation $Initial PT Evaluation Tier I: 1 Procedure PT Treatments $Therapeutic Activity: 8-22 mins   PT G Codes:        Febe Champa A Somaly Marteney 02/03/2015, 2:58 PM Wray [place], [place], DPT [phone]

## 2015-02-03 NOTE — Discharge Summary (Signed)
Physician Discharge Summary  Maurice Stone JQB:[id number] DOB: Mar 30, 1941 DOA: 02/02/2015  PCP: Garnett Farm, MD  Admit date: 02/02/2015 Discharge date: 02/03/2015  Time spent: 40 minutes  Recommendations for Outpatient Follow-up:  1. please refer patient to neurosurgery versus pain management for consideration of cervical epidural steroid injection  2. PT to follow-up with him at home  Discharge Diagnoses:  Principal Problem:   Stroke Active Problems:   Essential hypertension, benign   chronic Right sided weakness   Neuropathy   Diabetes mellitus   Discharge Condition: stable  Diet recommendation: heart healthy low-salt  Filed Weights   02/02/15 1039  Weight: 88.451 kg (195 lb)    History of present illness:  74 y.o. ? hypertension, diabetes mellitus ty II, no prior CAD per patient ,COPD + CVA~ 2002. He was admitted To 12/2013 as well as 03/2014 initially for a left pontine infarct and was readmitted 03/2014 as well with MRI negative for stroke. He presented to the [facility] [place] 02/02/15 after having 3 days of increasing numbness initially on his left hand and then worsening on his right hand Ever since his stroke in 2015, he has had a little bit of slurring of speech but states that his slurring of speech is worse now than prior. he was admitted and CT of the head showed no acute finding other than remote lacunar infarct his NIH was negative and it was 3 days since his prior level of function so he was not a candidate anyway His lipid panel showed slightly elevatedtriglyceride level of 153 and his A1c was pending on discharge The blood sugar over the course of stay was not adequately controlled and he was counseled extensively on diet MRI of the brain was performed and did not show acute CVA and neurology felt like his signs and symptoms were in keeping more with carpal tunnel and cervical myelopathy rather than acute stroke or lacunar infarct and a such further stroke  workup was held as this had recently been performed.  he tells me he is a veteran of [place] [employer] was exposed to [employer] so I'm not sure if this wouldContribute to some of his neuropathy and he follows up at the [facility] so he should follow-up with them for further management and care The rest of his medical issues were stable and he was felt stable and appropriate for discharge home pending physical therapy recommendations 02/03/59  Discharge Exam: Danley Danker Vitals:   02/03/15 1352  BP: 130/70  Pulse: 67  Temp: 97.8 F (36.6 C)  Resp: 20    General: eomi, ncat Cardiovascular: s1 s2 no m/r/g Respiratory: clear Finger-nose-finger within normal limits, Numbness improved, Power 5/5 and about the same as prior  Discharge Instructions   Discharge Instructions    Diet - low sodium heart healthy    Complete by:  As directed      Discharge instructions    Complete by:  As directed   Please be careful with excessive carbohydrates in the diet You did not have an acute stroke You will need further care according to your primary MD for neck pain as well as neuropathy at the [place] I am not sure if Agent Orange that you were exposed to in [place] may have had anything to do with this and I would ask your [facility] provider regarding this     Increase activity slowly    Complete by:  As directed  Current Discharge Medication List    CONTINUE these medications which have NOT CHANGED   Details  Artificial Tear Ointment (DRY EYES OP) Place 1 drop into both eyes daily as needed (dry eyes).    buPROPion (WELLBUTRIN) 100 MG tablet Take 1 tablet (100 mg total) by mouth 2 (two) times daily. Qty: 60 tablet, Refills: 1    Cholecalciferol 2000 UNITS CAPS Take 4,000 Units by mouth daily.    clopidogrel (PLAVIX) 75 MG tablet Take 1 tablet (75 mg total) by mouth daily. Qty: 30 tablet, Refills: 1    donepezil (ARICEPT) 10 MG tablet Take 1 tablet (10 mg total) by  mouth at bedtime. Qty: 30 tablet, Refills: 1    gabapentin (NEURONTIN) 600 MG tablet Take 1 tablet (600 mg total) by mouth 3 (three) times daily. Qty: 90 tablet, Refills: 1    HYDROcodone-acetaminophen (NORCO/VICODIN) 5-325 MG per tablet Take 2 tablets by mouth every 6 (six) hours as needed for moderate pain. Qty: 60 tablet, Refills: 0    insulin glargine (LANTUS) 100 UNIT/ML injection Inject 0.12 mLs (12 Units total) into the skin daily. Qty: 10 mL, Refills: 11    Liraglutide (VICTOZA) 18 MG/3ML SOPN Inject 1.8 mg into the skin daily.    loratadine (CLARITIN) 10 MG tablet Take 10 mg by mouth daily.    losartan (COZAAR) 100 MG tablet Take 0.5 tablets (50 mg total) by mouth daily. Qty: 30 tablet, Refills: 0    metoprolol tartrate (LOPRESSOR) 25 MG tablet Take 1 tablet (25 mg total) by mouth 2 (two) times daily. Qty: 60 tablet, Refills: 1    pantoprazole (PROTONIX) 40 MG tablet Take 1 tablet (40 mg total) by mouth daily. Qty: 30 tablet, Refills: 1    QUEtiapine (SEROQUEL) 100 MG tablet Take 1 tablet (100 mg total) by mouth at bedtime. Qty: 30 tablet, Refills: 0    sertraline (ZOLOFT) 100 MG tablet Take 2 tablets (200 mg total) by mouth daily. Qty: 30 tablet, Refills: 1    albuterol (PROVENTIL HFA;VENTOLIN HFA) 108 (90 BASE) MCG/ACT inhaler Inhale 1 puff into the lungs every 6 (six) hours as needed for wheezing or shortness of breath. Qty: 1 Inhaler, Refills: 0    fenofibrate 160 MG tablet Take 1 tablet (160 mg total) by mouth daily. Qty: 30 tablet, Refills: 1    insulin aspart (NOVOLOG) 100 UNIT/ML injection 2 units with meals three times a day if blood sugar more than 180 prior to meals. Qty: 10 mL, Refills: 0    pravastatin (PRAVACHOL) 20 MG tablet Take 1 tablet (20 mg total) by mouth daily. Qty: 30 tablet, Refills: 1       Allergies  Allergen Reactions  . Lisinopril Cough      The results of significant diagnostics from this hospitalization (including imaging,  microbiology, ancillary and laboratory) are listed below for reference.    Significant Diagnostic Studies: Dg Chest 2 View  02/02/2015   CLINICAL DATA:  Right-sided numbness for 2 days.  EXAM: CHEST  2 VIEW  COMPARISON:  03/28/2014 chest radiograph  FINDINGS: The cardiomediastinal silhouette is unremarkable.  There is no evidence of focal airspace disease, pulmonary edema, suspicious pulmonary nodule/mass, pleural effusion, or pneumothorax. No acute bony abnormalities are identified.  IMPRESSION: No active cardiopulmonary disease.   Electronically Signed   By: Margarette Canada M.D.   On: 02/02/2015 11:52   Ct Head Wo Contrast  02/02/2015   CLINICAL DATA:  Three day history of right-sided weakness and unsteady gait  EXAM: CT  HEAD WITHOUT CONTRAST  TECHNIQUE: Contiguous axial images were obtained from the base of the skull through the vertex without intravenous contrast.  COMPARISON:  Head CT 03/28/2014  FINDINGS: Stable age related cerebral atrophy, ventriculomegaly and periventricular white matter disease. No extra-axial fluid collections are identified. No CT findings for acute hemispheric infarction or intracranial hemorrhage. Remote lacunar type infarct noted in the left caudate area. No mass lesions. The brainstem and cerebellum are normal.  The bony structures are intact. The paranasal sinuses and mastoid air cells are clear. Globes are intact.  IMPRESSION: Stable age related cerebral atrophy, ventriculomegaly and periventricular white matter disease. There is also a remote lacunar type infarct in the left caudate area.  No acute intracranial findings or mass lesion.   Electronically Signed   By: Marijo Sanes M.D.   On: 02/02/2015 11:44   Mri Brain Without Contrast  02/03/2015   CLINICAL DATA:  New bilateral upper extremity and LEFT body numbness, improving. History of extremity numbness from prior strokes. History of diabetes, hypertension.  EXAM: MRI HEAD WITHOUT CONTRAST  MRA HEAD WITHOUT CONTRAST  MRI  CERVICAL SPINE WITHOUT CONTRAST  TECHNIQUE: Multiplanar, multiecho pulse sequences of the brain and surrounding structures, and cervical spine, to include the craniocervical junction and cervicothoracic junction, were obtained without intravenous contrast. Angiographic images of the head were obtained using MRA technique without contrast. MIP images provided.  COMPARISON:  CT head February 02, 2015 at 11:08 a.m. and MRI of the brain March 28, 2014  FINDINGS: MRI HEAD FINDINGS  No reduced diffusion to suggest acute ischemia. No susceptibility artifact to suggest hemorrhage.  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 The ventricles and sulci are otherwise normal for patient's age. Patchy supratentorial, pontine white matter T2 hyperintensities mildly progressed from prior MRI (LEFT frontal lobe, axial 15/24). No midline shift, mass effect or mass lesions.  No abnormal extra-axial fluid collections. Ocular globes and orbital contents are normal though, not tailored for evaluation. Trace ethmoid mucosal thickening without paranasal sinus air-fluid levels. Mastoid air cells are well aerated. No abnormal sellar expansion. No cerebellar tonsillar ectopia. No suspicious calvarial bone marrow signal.  MRA HEAD FINDINGS  Anterior circulation: Normal flow related enhancement of the included cervical, petrous, cavernous and supra clinoid internal carotid arteries. Patent anterior communicating artery. Patent anterior and middle cerebral arteries, including more distal segments. Mild stenosis proximal RIGHT M1 segment.  No large vessel occlusion, high-grade stenosis, aneurysm. Moderate luminal irregularity of the intracranial vessels.  Posterior circulation: LEFT vertebral artery is dominant. Basilar artery is patent, with normal flow related enhancement of the main branch vessels. Mild to moderate stenosis mid to distal basilar artery. Patent posterior cerebral  arteries, moderate to high-grade stenosis RIGHT P2 origin. Robust RIGHT, small LEFT posterior communicating artery is present.  No large vessel occlusion, aneurysm. Moderate luminal irregularity of the intracranial vessels.  MRI CERVICAL SPINE FINDINGS  Cervical vertebral bodies and posterior elements are intact and aligned. Maintenance of cervical lordosis. Moderate C5-6, C6-7 disc height loss. Remaining disc morphology is maintained. Decreased T2 signal within all cervical disc consistent mild desiccation. Moderate chronic discogenic endplate changes J8-2 and C6-7. No STIR signal abnormality to suggest acute osseous process.  Cervical spinal cord appears normal morphology and signal characteristics from the craniocervical junction to at least the level of T1-2, the most caudal well visualized level. Cranial cervical junction maintained. Included prevertebral and paraspinal soft tissues are nonsuspicious.  Level by level evaluation (  mildly motion degraded axial sequences limit evaluation):  C2-3: C2-3: Small broad-based disc bulge, uncovertebral hypertrophy and mild to moderate facet arthropathy without canal stenosis. Moderate RIGHT neural foraminal narrowing.  C3-4: 2 mm broad-based disc bulge, uncovertebral hypertrophy and mild facet arthropathy. Mild canal stenosis. Moderate neural foraminal narrowing.  C4-5: Annular bulging, uncovertebral hypertrophy. No canal stenosis or neural foraminal narrowing.  C5-6: 3 mm broad-based disc bulge asymmetric to the LEFT. Uncovertebral hypertrophy. Mild to moderate canal stenosis. Moderate RIGHT, severe LEFT neural foraminal narrowing.  C6-7: 2 mm broad-based disc bulge, uncovertebral hypertrophy. Mild to moderate canal stenosis. Moderate RIGHT greater LEFT neural foraminal narrowing.  C7-T1: Annular bulging. No canal stenosis or neural foraminal narrowing. Subcentimeter LEFT C8 perineural cyst.  IMPRESSION: MRI HEAD: No acute intracranial process, specifically no acute  ischemia.  Progressed white matter changes compatible with moderate chronic small vessel ischemic disease. 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  MRA HEAD: No large vessel occlusion. Moderate to high-grade stenosis RIGHT P2 origin. Mild to moderate stenosis basilar artery.  Moderate luminal irregularity of the intracranial vessels most consistent with chronic small vessel ischemic disease.  MRI CERVICAL SPINE: No acute fracture or malalignment. Normal noncontrast MRI of the cervical spinal cord.  Degenerative cervical spine resulting in mild to moderate canal stenosis C5-6 and C6-7, mild at C3-4.  Neural foraminal narrowing C3-4, C5-6 and C6-7: Severe on the LEFT at C5-6.   Electronically Signed   By: Elon Alas M.D.   On: 02/03/2015 00:19   Mr Cervical Spine Wo Contrast  02/03/2015   CLINICAL DATA:  New bilateral upper extremity and LEFT body numbness, improving. History of extremity numbness from prior strokes. History of diabetes, hypertension.  EXAM: MRI HEAD WITHOUT CONTRAST  MRA HEAD WITHOUT CONTRAST  MRI CERVICAL SPINE WITHOUT CONTRAST  TECHNIQUE: Multiplanar, multiecho pulse sequences of the brain and surrounding structures, and cervical spine, to include the craniocervical junction and cervicothoracic junction, were obtained without intravenous contrast. Angiographic images of the head were obtained using MRA technique without contrast. MIP images provided.  COMPARISON:  CT head February 02, 2015 at 11:08 a.m. and MRI of the brain March 28, 2014  FINDINGS: MRI HEAD FINDINGS  No reduced diffusion to suggest acute ischemia. No susceptibility artifact to suggest hemorrhage.  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 The ventricles and sulci are otherwise normal for patient's age. Patchy supratentorial, pontine white matter T2 hyperintensities mildly progressed from prior MRI (LEFT frontal lobe, axial 15/24). No  midline shift, mass effect or mass lesions.  No abnormal extra-axial fluid collections. Ocular globes and orbital contents are normal though, not tailored for evaluation. Trace ethmoid mucosal thickening without paranasal sinus air-fluid levels. Mastoid air cells are well aerated. No abnormal sellar expansion. No cerebellar tonsillar ectopia. No suspicious calvarial bone marrow signal.  MRA HEAD FINDINGS  Anterior circulation: Normal flow related enhancement of the included cervical, petrous, cavernous and supra clinoid internal carotid arteries. Patent anterior communicating artery. Patent anterior and middle cerebral arteries, including more distal segments. Mild stenosis proximal RIGHT M1 segment.  No large vessel occlusion, high-grade stenosis, aneurysm. Moderate luminal irregularity of the intracranial vessels.  Posterior circulation: LEFT vertebral artery is dominant. Basilar artery is patent, with normal flow related enhancement of the main branch vessels. Mild to moderate stenosis mid to distal basilar artery. Patent posterior cerebral arteries, moderate to high-grade stenosis RIGHT P2 origin. Robust RIGHT, small LEFT posterior communicating artery is present.  No large vessel occlusion, aneurysm. Moderate luminal irregularity of the intracranial vessels.  MRI CERVICAL SPINE FINDINGS  Cervical vertebral bodies and posterior elements are intact and aligned. Maintenance of cervical lordosis. Moderate C5-6, C6-7 disc height loss. Remaining disc morphology is maintained. Decreased T2 signal within all cervical disc consistent mild desiccation. Moderate chronic discogenic endplate changes J6-2 and C6-7. No STIR signal abnormality to suggest acute osseous process.  Cervical spinal cord appears normal morphology and signal characteristics from the craniocervical junction to at least the level of T1-2, the most caudal well visualized level. Cranial cervical junction maintained. Included prevertebral and paraspinal  soft tissues are nonsuspicious.  Level by level evaluation (mildly motion degraded axial sequences limit evaluation):  C2-3: C2-3: Small broad-based disc bulge, uncovertebral hypertrophy and mild to moderate facet arthropathy without canal stenosis. Moderate RIGHT neural foraminal narrowing.  C3-4: 2 mm broad-based disc bulge, uncovertebral hypertrophy and mild facet arthropathy. Mild canal stenosis. Moderate neural foraminal narrowing.  C4-5: Annular bulging, uncovertebral hypertrophy. No canal stenosis or neural foraminal narrowing.  C5-6: 3 mm broad-based disc bulge asymmetric to the LEFT. Uncovertebral hypertrophy. Mild to moderate canal stenosis. Moderate RIGHT, severe LEFT neural foraminal narrowing.  C6-7: 2 mm broad-based disc bulge, uncovertebral hypertrophy. Mild to moderate canal stenosis. Moderate RIGHT greater LEFT neural foraminal narrowing.  C7-T1: Annular bulging. No canal stenosis or neural foraminal narrowing. Subcentimeter LEFT C8 perineural cyst.  IMPRESSION: MRI HEAD: No acute intracranial process, specifically no acute ischemia.  Progressed white matter changes compatible with moderate chronic small vessel ischemic disease. 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  MRA HEAD: No large vessel occlusion. Moderate to high-grade stenosis RIGHT P2 origin. Mild to moderate stenosis basilar artery.  Moderate luminal irregularity of the intracranial vessels most consistent with chronic small vessel ischemic disease.  MRI CERVICAL SPINE: No acute fracture or malalignment. Normal noncontrast MRI of the cervical spinal cord.  Degenerative cervical spine resulting in mild to moderate canal stenosis C5-6 and C6-7, mild at C3-4.  Neural foraminal narrowing C3-4, C5-6 and C6-7: Severe on the LEFT at C5-6.   Electronically Signed   By: Elon Alas M.D.   On: 02/03/2015 00:19   Mr Jodene Nam Head/brain Wo Cm  02/03/2015   CLINICAL DATA:  New bilateral upper extremity and LEFT body numbness,  improving. History of extremity numbness from prior strokes. History of diabetes, hypertension.  EXAM: MRI HEAD WITHOUT CONTRAST  MRA HEAD WITHOUT CONTRAST  MRI CERVICAL SPINE WITHOUT CONTRAST  TECHNIQUE: Multiplanar, multiecho pulse sequences of the brain and surrounding structures, and cervical spine, to include the craniocervical junction and cervicothoracic junction, were obtained without intravenous contrast. Angiographic images of the head were obtained using MRA technique without contrast. MIP images provided.  COMPARISON:  CT head February 02, 2015 at 11:08 a.m. and MRI of the brain March 28, 2014  FINDINGS: MRI HEAD FINDINGS  No reduced diffusion to suggest acute ischemia. No susceptibility artifact to suggest hemorrhage.  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 The ventricles and sulci are otherwise normal for patient's age. Patchy supratentorial, pontine white matter T2 hyperintensities mildly progressed from prior MRI (LEFT frontal lobe, axial 15/24). No midline shift, mass effect or mass lesions.  No abnormal extra-axial fluid collections. Ocular globes and orbital contents are normal though, not tailored for evaluation. Trace ethmoid mucosal thickening without paranasal sinus air-fluid levels. Mastoid air cells are well aerated. No abnormal sellar expansion. No cerebellar tonsillar ectopia. No  suspicious calvarial bone marrow signal.  MRA HEAD FINDINGS  Anterior circulation: Normal flow related enhancement of the included cervical, petrous, cavernous and supra clinoid internal carotid arteries. Patent anterior communicating artery. Patent anterior and middle cerebral arteries, including more distal segments. Mild stenosis proximal RIGHT M1 segment.  No large vessel occlusion, high-grade stenosis, aneurysm. Moderate luminal irregularity of the intracranial vessels.  Posterior circulation: LEFT vertebral artery is dominant. Basilar  artery is patent, with normal flow related enhancement of the main branch vessels. Mild to moderate stenosis mid to distal basilar artery. Patent posterior cerebral arteries, moderate to high-grade stenosis RIGHT P2 origin. Robust RIGHT, small LEFT posterior communicating artery is present.  No large vessel occlusion, aneurysm. Moderate luminal irregularity of the intracranial vessels.  MRI CERVICAL SPINE FINDINGS  Cervical vertebral bodies and posterior elements are intact and aligned. Maintenance of cervical lordosis. Moderate C5-6, C6-7 disc height loss. Remaining disc morphology is maintained. Decreased T2 signal within all cervical disc consistent mild desiccation. Moderate chronic discogenic endplate changes J6-8 and C6-7. No STIR signal abnormality to suggest acute osseous process.  Cervical spinal cord appears normal morphology and signal characteristics from the craniocervical junction to at least the level of T1-2, the most caudal well visualized level. Cranial cervical junction maintained. Included prevertebral and paraspinal soft tissues are nonsuspicious.  Level by level evaluation (mildly motion degraded axial sequences limit evaluation):  C2-3: C2-3: Small broad-based disc bulge, uncovertebral hypertrophy and mild to moderate facet arthropathy without canal stenosis. Moderate RIGHT neural foraminal narrowing.  C3-4: 2 mm broad-based disc bulge, uncovertebral hypertrophy and mild facet arthropathy. Mild canal stenosis. Moderate neural foraminal narrowing.  C4-5: Annular bulging, uncovertebral hypertrophy. No canal stenosis or neural foraminal narrowing.  C5-6: 3 mm broad-based disc bulge asymmetric to the LEFT. Uncovertebral hypertrophy. Mild to moderate canal stenosis. Moderate RIGHT, severe LEFT neural foraminal narrowing.  C6-7: 2 mm broad-based disc bulge, uncovertebral hypertrophy. Mild to moderate canal stenosis. Moderate RIGHT greater LEFT neural foraminal narrowing.  C7-T1: Annular bulging. No  canal stenosis or neural foraminal narrowing. Subcentimeter LEFT C8 perineural cyst.  IMPRESSION: MRI HEAD: No acute intracranial process, specifically no acute ischemia.  Progressed white matter changes compatible with moderate chronic small vessel ischemic disease. 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  MRA HEAD: No large vessel occlusion. Moderate to high-grade stenosis RIGHT P2 origin. Mild to moderate stenosis basilar artery.  Moderate luminal irregularity of the intracranial vessels most consistent with chronic small vessel ischemic disease.  MRI CERVICAL SPINE: No acute fracture or malalignment. Normal noncontrast MRI of the cervical spinal cord.  Degenerative cervical spine resulting in mild to moderate canal stenosis C5-6 and C6-7, mild at C3-4.  Neural foraminal narrowing C3-4, C5-6 and C6-7: Severe on the LEFT at C5-6.   Electronically Signed   By: Elon Alas M.D.   On: 02/03/2015 00:19    Microbiology: No results found for this or any previous visit (from the past 240 hour(s)).   Labs: Basic Metabolic Panel:  Recent Labs Lab 02/02/15 1041 02/02/15 1909  NA 137  --   K 3.8  --   CL 101  --   CO2 24  --   GLUCOSE 388*  --   BUN 12  --   CREATININE 0.98 0.88  CALCIUM 9.5  --    Liver Function Tests:  Recent Labs Lab 02/02/15 1041  AST 38  ALT 42  ALKPHOS 100  BILITOT 0.7  PROT 7.4  ALBUMIN 4.3   No results  for input(s): LIPASE, AMYLASE in the last 168 hours. No results for input(s): AMMONIA in the last 168 hours. CBC:  Recent Labs Lab 02/02/15 1041 02/02/15 1909  WBC 7.8 7.2  HGB 14.1 13.8  HCT 41.5 39.7  MCV 90.0 88.0  PLT 136* 122*   Cardiac Enzymes:  Recent Labs Lab 02/02/15 1041  TROPONINI <0.03   BNP: BNP (last 3 results) No results for input(s): BNP in the last 8760 hours.  ProBNP (last 3 results) No results for input(s): PROBNP in the last 8760 hours.  CBG:  Recent Labs Lab 02/02/15 1043 02/02/15 1851  02/02/15 2117 02/03/15 0636 02/03/15 1122  GLUCAP 384* 187* 226* 146* 281*       Signed:  Nita Sells  [facility] 02/03/2015, 2:55 PM

## 2015-02-03 NOTE — Progress Notes (Signed)
Patient is being d/c. Dc instructions given and patient verbalized understanding. 

## 2015-02-04 LAB — HEMOGLOBIN A1C
Hgb A1c MFr Bld: 9.9 % — ABNORMAL HIGH (ref 4.8–5.6)
Mean Plasma Glucose: 237 mg/dL

## 2015-03-31 ENCOUNTER — Encounter (HOSPITAL_COMMUNITY): Payer: Self-pay

## 2015-03-31 ENCOUNTER — Emergency Department (HOSPITAL_COMMUNITY): Payer: Non-veteran care

## 2015-03-31 ENCOUNTER — Emergency Department (HOSPITAL_COMMUNITY)
Admission: EM | Admit: 2015-03-31 | Discharge: 2015-04-01 | Disposition: A | Discharge status: Home / Self Care | Payer: Non-veteran care | Attending: Emergency Medicine | Admitting: Emergency Medicine

## 2015-03-31 DIAGNOSIS — K219 Gastro-esophageal reflux disease without esophagitis: Secondary | ICD-10-CM | POA: Insufficient documentation

## 2015-03-31 DIAGNOSIS — Z87891 Personal history of nicotine dependence: Secondary | ICD-10-CM | POA: Insufficient documentation

## 2015-03-31 DIAGNOSIS — Z794 Long term (current) use of insulin: Secondary | ICD-10-CM | POA: Diagnosis not present

## 2015-03-31 DIAGNOSIS — M069 Rheumatoid arthritis, unspecified: Secondary | ICD-10-CM | POA: Diagnosis not present

## 2015-03-31 DIAGNOSIS — J449 Chronic obstructive pulmonary disease, unspecified: Secondary | ICD-10-CM | POA: Diagnosis not present

## 2015-03-31 DIAGNOSIS — Z8673 Personal history of transient ischemic attack (TIA), and cerebral infarction without residual deficits: Secondary | ICD-10-CM | POA: Diagnosis not present

## 2015-03-31 DIAGNOSIS — I10 Essential (primary) hypertension: Secondary | ICD-10-CM | POA: Insufficient documentation

## 2015-03-31 DIAGNOSIS — R296 Repeated falls: Secondary | ICD-10-CM | POA: Diagnosis not present

## 2015-03-31 DIAGNOSIS — Z79899 Other long term (current) drug therapy: Secondary | ICD-10-CM | POA: Diagnosis not present

## 2015-03-31 DIAGNOSIS — I2510 Atherosclerotic heart disease of native coronary artery without angina pectoris: Secondary | ICD-10-CM | POA: Diagnosis not present

## 2015-03-31 DIAGNOSIS — E119 Type 2 diabetes mellitus without complications: Secondary | ICD-10-CM | POA: Insufficient documentation

## 2015-03-31 DIAGNOSIS — R531 Weakness: Secondary | ICD-10-CM | POA: Diagnosis not present

## 2015-03-31 LAB — BASIC METABOLIC PANEL
ANION GAP: 8 (ref 5–15)
BUN: 10 mg/dL (ref 6–20)
CALCIUM: 9.5 mg/dL (ref 8.9–10.3)
CO2: 26 mmol/L (ref 22–32)
CREATININE: 0.97 mg/dL (ref 0.61–1.24)
Chloride: 103 mmol/L (ref 101–111)
Glucose, Bld: 262 mg/dL — ABNORMAL HIGH (ref 65–99)
Potassium: 4 mmol/L (ref 3.5–5.1)
Sodium: 137 mmol/L (ref 135–145)

## 2015-03-31 LAB — CBC WITH DIFFERENTIAL/PLATELET
BASOS PCT: 0 % (ref 0–1)
Basophils Absolute: 0 10*3/uL (ref 0.0–0.1)
EOS PCT: 2 % (ref 0–5)
Eosinophils Absolute: 0.1 10*3/uL (ref 0.0–0.7)
HEMATOCRIT: 41.6 % (ref 39.0–52.0)
Hemoglobin: 13.8 g/dL (ref 13.0–17.0)
Lymphocytes Relative: 34 % (ref 12–46)
Lymphs Abs: 2.7 10*3/uL (ref 0.7–4.0)
MCH: 29.7 pg (ref 26.0–34.0)
MCHC: 33.2 g/dL (ref 30.0–36.0)
MCV: 89.5 fL (ref 78.0–100.0)
MONO ABS: 0.6 10*3/uL (ref 0.1–1.0)
MONOS PCT: 8 % (ref 3–12)
NEUTROS ABS: 4.5 10*3/uL (ref 1.7–7.7)
Neutrophils Relative %: 56 % (ref 43–77)
Platelets: 126 10*3/uL — ABNORMAL LOW (ref 150–400)
RBC: 4.65 MIL/uL (ref 4.22–5.81)
RDW: 14.3 % (ref 11.5–15.5)
WBC: 7.9 10*3/uL (ref 4.0–10.5)

## 2015-03-31 LAB — URINALYSIS, ROUTINE W REFLEX MICROSCOPIC
Bilirubin Urine: NEGATIVE
HGB URINE DIPSTICK: NEGATIVE
KETONES UR: NEGATIVE mg/dL
LEUKOCYTES UA: NEGATIVE
Nitrite: NEGATIVE
PH: 5.5 (ref 5.0–8.0)
Protein, ur: NEGATIVE mg/dL
Specific Gravity, Urine: 1.036 — ABNORMAL HIGH (ref 1.005–1.030)
Urobilinogen, UA: 1 mg/dL (ref 0.0–1.0)

## 2015-03-31 LAB — URINE MICROSCOPIC-ADD ON

## 2015-03-31 LAB — I-STAT TROPONIN, ED: Troponin i, poc: 0.01 ng/mL (ref 0.00–0.08)

## 2015-03-31 NOTE — Care Management Note (Signed)
Case Management Note  Patient Details  Name: Maurice Stone MRN: [id number] Date of Birth: [birth year]  Subjective/Objective:        Patient presented to [facility] ED with recurrent fall s/p CVA 2015        Action/Plan:  ED CM consulted for recommendation for [facility] services for medication management, blood pressure monitoroing with strength, reconditioning and gait training. Reviewed record and met with patient to discuss the recommendation, patient is amendable. Offered choice patient selected [facility] for [facility] RN, PT services.    Expected Discharge Date:                  Expected Discharge Plan:   Home with Home Health  In-House Referral:     Discharge [place]    Post Acute Care Choice:    Choice offered to:   Patient/ Selected [facility]  DME Arranged:    DME Agency:     HH Arranged:   RN, PT HH Agency:   [facility]  Status of Service:     Medicare Important Message Given:    Date Medicare IM Given:    Medicare IM give by:    Date Additional Medicare IM Given:    Additional Medicare Important Message give by:     If discussed at [place] of Stay Meetings, dates discussed:    Additional Comments: CM awaiting results from MRI with ED evaluation for disposition determination. Will call referral in to [facility] in the am if patient is discharged tonight. CM will continue to follow-up with patient. Will contact at verified number tomorrow 9/6 if discharged home tonight.  Laurena Slimmer, RN 03/31/2015, 3:46 PM

## 2015-03-31 NOTE — ED Provider Notes (Signed)
Maurice Stone is a 73 yo male with PMHx of HTN, DM, CAD, COPD, and ischemic CVA 2015. Progressive generalized weakness over past 2 months. Frequent falls with x3 noted yesterday. Denies hitting head or LOC. Denies dizziness but notes leg weakness and imbalance. Of note, patient has RUE/RLE weakness deficit at baseline since previous CVA and thinks it may be slightly worse than previous.  EKG normal. Troponin 0.01. WBC 7.9. Hemoglobin 13.8. BMP relatively unremarkable aside from glucose of 262. UA negative for infection or blood but showing dehydration with severe gravity of 1.036 and glucosuria chest x-ray showing no acute cardiopulmonary disease. CT head without contrast showing mild atrophy and chronic small vessel disease but no acute intracranial abnormality. MRI brain showing no acute intercranial process but noting remote lacunar infarcts seen on previous MRI.  Social worker been contacted and patient provided with walking device at home. Patient discharged home in stable condition. Patient will follow up with PCP this week to ensure symptoms are continuing to improve. Strict ED return cautions discussed. Patient stands agrees with plan and has no further questions concerning this time.  Pt care discussed with and followed by my attending, Dr. Berniece Andreas, MD 79/48/01 6553  Delora Fuel, MD 74/82/70 [id number]
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
## 2015-03-31 NOTE — ED Notes (Signed)
[facility]- pt coming from home with c/o fall X3 yesterday. Pt denies dizziness. Endorses slight headache. Vital signs stable en route. Pt reports he fell several times before his previous stroke 1 year ago.

## 2015-03-31 NOTE — Discharge Instructions (Signed)

## 2015-03-31 NOTE — ED Notes (Signed)
This RN called MRI who stated pt on transport list.

## 2015-04-01 NOTE — Care Management (Signed)
ED CM faxed [facility] referral to [facility] [phone] fax confirmation received. Start of care date 04/02/15. No further ED CM needs identified.

## 2016-12-10 ENCOUNTER — Emergency Department (HOSPITAL_COMMUNITY): Payer: Non-veteran care

## 2016-12-10 ENCOUNTER — Encounter (HOSPITAL_COMMUNITY): Payer: Self-pay | Admitting: Emergency Medicine

## 2016-12-10 ENCOUNTER — Observation Stay (HOSPITAL_COMMUNITY)
Admission: EM | Admit: 2016-12-10 | Discharge: 2016-12-11 | Disposition: A | Discharge status: Home Health | Payer: Non-veteran care | Attending: Internal Medicine | Admitting: Internal Medicine

## 2016-12-10 DIAGNOSIS — E1142 Type 2 diabetes mellitus with diabetic polyneuropathy: Secondary | ICD-10-CM | POA: Insufficient documentation

## 2016-12-10 DIAGNOSIS — E119 Type 2 diabetes mellitus without complications: Secondary | ICD-10-CM

## 2016-12-10 DIAGNOSIS — R5383 Other fatigue: Secondary | ICD-10-CM

## 2016-12-10 DIAGNOSIS — F0390 Unspecified dementia without behavioral disturbance: Secondary | ICD-10-CM | POA: Diagnosis not present

## 2016-12-10 DIAGNOSIS — T68XXXA Hypothermia, initial encounter: Secondary | ICD-10-CM | POA: Diagnosis present

## 2016-12-10 DIAGNOSIS — Z87891 Personal history of nicotine dependence: Secondary | ICD-10-CM | POA: Insufficient documentation

## 2016-12-10 DIAGNOSIS — F419 Anxiety disorder, unspecified: Secondary | ICD-10-CM | POA: Diagnosis not present

## 2016-12-10 DIAGNOSIS — R200 Anesthesia of skin: Secondary | ICD-10-CM | POA: Diagnosis present

## 2016-12-10 DIAGNOSIS — M069 Rheumatoid arthritis, unspecified: Secondary | ICD-10-CM | POA: Insufficient documentation

## 2016-12-10 DIAGNOSIS — Z7902 Long term (current) use of antithrombotics/antiplatelets: Secondary | ICD-10-CM | POA: Diagnosis not present

## 2016-12-10 DIAGNOSIS — K219 Gastro-esophageal reflux disease without esophagitis: Secondary | ICD-10-CM | POA: Diagnosis not present

## 2016-12-10 DIAGNOSIS — Z888 Allergy status to other drugs, medicaments and biological substances status: Secondary | ICD-10-CM | POA: Insufficient documentation

## 2016-12-10 DIAGNOSIS — Z96652 Presence of left artificial knee joint: Secondary | ICD-10-CM | POA: Diagnosis not present

## 2016-12-10 DIAGNOSIS — I10 Essential (primary) hypertension: Secondary | ICD-10-CM | POA: Insufficient documentation

## 2016-12-10 DIAGNOSIS — E785 Hyperlipidemia, unspecified: Secondary | ICD-10-CM | POA: Diagnosis not present

## 2016-12-10 DIAGNOSIS — R680 Hypothermia, not associated with low environmental temperature: Secondary | ICD-10-CM | POA: Insufficient documentation

## 2016-12-10 DIAGNOSIS — Z794 Long term (current) use of insulin: Secondary | ICD-10-CM | POA: Insufficient documentation

## 2016-12-10 DIAGNOSIS — E1159 Type 2 diabetes mellitus with other circulatory complications: Secondary | ICD-10-CM

## 2016-12-10 DIAGNOSIS — Z79899 Other long term (current) drug therapy: Secondary | ICD-10-CM | POA: Insufficient documentation

## 2016-12-10 DIAGNOSIS — R001 Bradycardia, unspecified: Secondary | ICD-10-CM | POA: Diagnosis not present

## 2016-12-10 DIAGNOSIS — R4182 Altered mental status, unspecified: Secondary | ICD-10-CM | POA: Insufficient documentation

## 2016-12-10 DIAGNOSIS — I2510 Atherosclerotic heart disease of native coronary artery without angina pectoris: Secondary | ICD-10-CM | POA: Insufficient documentation

## 2016-12-10 DIAGNOSIS — R202 Paresthesia of skin: Principal | ICD-10-CM | POA: Insufficient documentation

## 2016-12-10 DIAGNOSIS — Z531 Procedure and treatment not carried out because of patient's decision for reasons of belief and group pressure: Secondary | ICD-10-CM | POA: Diagnosis not present

## 2016-12-10 DIAGNOSIS — F329 Major depressive disorder, single episode, unspecified: Secondary | ICD-10-CM | POA: Diagnosis not present

## 2016-12-10 DIAGNOSIS — I69351 Hemiplegia and hemiparesis following cerebral infarction affecting right dominant side: Secondary | ICD-10-CM | POA: Insufficient documentation

## 2016-12-10 DIAGNOSIS — J449 Chronic obstructive pulmonary disease, unspecified: Secondary | ICD-10-CM | POA: Diagnosis not present

## 2016-12-10 DIAGNOSIS — I6930 Unspecified sequelae of cerebral infarction: Secondary | ICD-10-CM

## 2016-12-10 LAB — COMPREHENSIVE METABOLIC PANEL
ALBUMIN: 3.8 g/dL (ref 3.5–5.0)
ALK PHOS: 95 U/L (ref 38–126)
ALT: 25 U/L (ref 17–63)
AST: 30 U/L (ref 15–41)
Anion gap: 8 (ref 5–15)
BILIRUBIN TOTAL: 1.1 mg/dL (ref 0.3–1.2)
BUN: 9 mg/dL (ref 6–20)
CO2: 29 mmol/L (ref 22–32)
CREATININE: 0.9 mg/dL (ref 0.61–1.24)
Calcium: 9.2 mg/dL (ref 8.9–10.3)
Chloride: 102 mmol/L (ref 101–111)
GFR calc Af Amer: >60 mL/min (ref >60)
GLUCOSE: 178 mg/dL — AB (ref 65–99)
Potassium: 3.9 mmol/L (ref 3.5–5.1)
Sodium: 139 mmol/L (ref 135–145)
TOTAL PROTEIN: 7 g/dL (ref 6.5–8.1)

## 2016-12-10 LAB — CBC
HEMATOCRIT: 40.4 % (ref 39.0–52.0)
HEMOGLOBIN: 13 g/dL (ref 13.0–17.0)
MCH: 29.5 pg (ref 26.0–34.0)
MCHC: 32.2 g/dL (ref 30.0–36.0)
MCV: 91.8 fL (ref 78.0–100.0)
Platelets: 126 10*3/uL — ABNORMAL LOW (ref 150–400)
RBC: 4.4 MIL/uL (ref 4.22–5.81)
RDW: 14.2 % (ref 11.5–15.5)
WBC: 8.9 10*3/uL (ref 4.0–10.5)

## 2016-12-10 LAB — URINALYSIS, ROUTINE W REFLEX MICROSCOPIC
BACTERIA UA: NONE SEEN
Bilirubin Urine: NEGATIVE
Hgb urine dipstick: NEGATIVE
Ketones, ur: NEGATIVE mg/dL
Leukocytes, UA: NEGATIVE
NITRITE: NEGATIVE
PH: 8 (ref 5.0–8.0)
Protein, ur: NEGATIVE mg/dL
SPECIFIC GRAVITY, URINE: 1.022 (ref 1.005–1.030)

## 2016-12-10 LAB — I-STAT CHEM 8, ED
BUN: 15 mg/dL (ref 6–20)
CHLORIDE: 100 mmol/L — AB (ref 101–111)
CREATININE: 0.9 mg/dL (ref 0.61–1.24)
Calcium, Ion: 1.09 mmol/L — ABNORMAL LOW (ref 1.15–1.40)
Glucose, Bld: 177 mg/dL — ABNORMAL HIGH (ref 65–99)
HEMATOCRIT: 40 % (ref 39.0–52.0)
Hemoglobin: 13.6 g/dL (ref 13.0–17.0)
POTASSIUM: 5.7 mmol/L — AB (ref 3.5–5.1)
SODIUM: 138 mmol/L (ref 135–145)
TCO2: 31 mmol/L (ref 0–100)

## 2016-12-10 LAB — DIFFERENTIAL
BASOS ABS: 0 10*3/uL (ref 0.0–0.1)
Basophils Relative: 0 %
EOS PCT: 2 %
Eosinophils Absolute: 0.2 10*3/uL (ref 0.0–0.7)
LYMPHS ABS: 3.5 10*3/uL (ref 0.7–4.0)
Lymphocytes Relative: 39 %
MONO ABS: 1 10*3/uL (ref 0.1–1.0)
MONOS PCT: 11 %
Neutro Abs: 4.3 10*3/uL (ref 1.7–7.7)
Neutrophils Relative %: 48 %

## 2016-12-10 LAB — RAPID URINE DRUG SCREEN, HOSP PERFORMED
AMPHETAMINES: NOT DETECTED
BARBITURATES: NOT DETECTED
Benzodiazepines: NOT DETECTED
Cocaine: NOT DETECTED
OPIATES: NOT DETECTED
TETRAHYDROCANNABINOL: NOT DETECTED

## 2016-12-10 LAB — LACTIC ACID, PLASMA: Lactic Acid, Venous: 1.7 mmol/L (ref 0.5–1.9)

## 2016-12-10 LAB — TSH: TSH: 0.74 u[IU]/mL (ref 0.350–4.500)

## 2016-12-10 LAB — I-STAT TROPONIN, ED: Troponin i, poc: 0 ng/mL (ref 0.00–0.08)

## 2016-12-10 LAB — ETHANOL: Alcohol, Ethyl (B): <5 mg/dL (ref <5)

## 2016-12-10 LAB — GLUCOSE, CAPILLARY: GLUCOSE-CAPILLARY: 200 mg/dL — AB (ref 65–99)

## 2016-12-10 MED ORDER — ENOXAPARIN SODIUM 40 MG/0.4ML ~~LOC~~ SOLN
40.0000 mg | Freq: Every day | SUBCUTANEOUS | Status: DC
  Administered 2016-12-11: 40 mg via SUBCUTANEOUS
  Filled 2016-12-10: qty 0.4

## 2016-12-10 MED ORDER — LOSARTAN POTASSIUM 50 MG PO TABS
25.0000 mg | ORAL_TABLET | Freq: Every day | ORAL | Status: DC
  Administered 2016-12-11: 25 mg via ORAL
  Filled 2016-12-10: qty 1

## 2016-12-10 MED ORDER — SERTRALINE HCL 100 MG PO TABS
100.0000 mg | ORAL_TABLET | Freq: Every day | ORAL | Status: DC
  Administered 2016-12-11: 100 mg via ORAL
  Filled 2016-12-10: qty 1

## 2016-12-10 MED ORDER — INSULIN ASPART 100 UNIT/ML ~~LOC~~ SOLN
0.0000 [IU] | Freq: Three times a day (TID) | SUBCUTANEOUS | Status: DC
  Administered 2016-12-11: 3 [IU] via SUBCUTANEOUS
  Administered 2016-12-11: 5 [IU] via SUBCUTANEOUS
  Administered 2016-12-11: 3 [IU] via SUBCUTANEOUS

## 2016-12-10 MED ORDER — TRAMADOL HCL 50 MG PO TABS: 50.0000 mg | ORAL_TABLET | Freq: Two times a day (BID) | ORAL | Status: DC | PRN

## 2016-12-10 MED ORDER — SODIUM CHLORIDE 0.9 % IV BOLUS (SEPSIS)
500.0000 mL | Freq: Once | INTRAVENOUS | Status: AC
  Administered 2016-12-10: 500 mL via INTRAVENOUS

## 2016-12-10 MED ORDER — METOPROLOL SUCCINATE ER 50 MG PO TB24
50.0000 mg | ORAL_TABLET | Freq: Two times a day (BID) | ORAL | Status: DC
  Administered 2016-12-11: 50 mg via ORAL
  Filled 2016-12-10 (×2): qty 1

## 2016-12-10 MED ORDER — LIRAGLUTIDE 18 MG/3ML ~~LOC~~ SOPN: 1.8000 mg | PEN_INJECTOR | Freq: Every day | SUBCUTANEOUS | Status: DC

## 2016-12-10 MED ORDER — METFORMIN HCL 500 MG PO TABS
500.0000 mg | ORAL_TABLET | Freq: Every day | ORAL | Status: DC
  Administered 2016-12-11: 500 mg via ORAL
  Filled 2016-12-10: qty 1

## 2016-12-10 MED ORDER — PANTOPRAZOLE SODIUM 40 MG PO TBEC
40.0000 mg | DELAYED_RELEASE_TABLET | Freq: Every day | ORAL | Status: DC
  Administered 2016-12-11: 40 mg via ORAL
  Filled 2016-12-10: qty 1

## 2016-12-10 MED ORDER — ATORVASTATIN CALCIUM 40 MG PO TABS
40.0000 mg | ORAL_TABLET | Freq: Every day | ORAL | Status: DC
  Administered 2016-12-11: 40 mg via ORAL
  Filled 2016-12-10: qty 1

## 2016-12-10 MED ORDER — INSULIN NPH (HUMAN) (ISOPHANE) 100 UNIT/ML ~~LOC~~ SUSP
50.0000 [IU] | Freq: Two times a day (BID) | SUBCUTANEOUS | Status: DC
  Administered 2016-12-11 (×2): 50 [IU] via SUBCUTANEOUS
  Filled 2016-12-10 (×2): qty 10

## 2016-12-10 MED ORDER — DONEPEZIL HCL 10 MG PO TABS
10.0000 mg | ORAL_TABLET | Freq: Every day | ORAL | Status: DC
  Administered 2016-12-11: 10 mg via ORAL
  Filled 2016-12-10: qty 1

## 2016-12-10 MED ORDER — AMLODIPINE BESYLATE 2.5 MG PO TABS
2.5000 mg | ORAL_TABLET | Freq: Every day | ORAL | Status: DC
  Administered 2016-12-11: 2.5 mg via ORAL
  Filled 2016-12-10: qty 1

## 2016-12-10 MED ORDER — CANAGLIFLOZIN 100 MG PO TABS
100.0000 mg | ORAL_TABLET | Freq: Every day | ORAL | Status: DC
  Administered 2016-12-11: 100 mg via ORAL
  Filled 2016-12-10 (×2): qty 1

## 2016-12-10 MED ORDER — CLOPIDOGREL BISULFATE 75 MG PO TABS
75.0000 mg | ORAL_TABLET | Freq: Every day | ORAL | Status: DC
  Administered 2016-12-11: 75 mg via ORAL
  Filled 2016-12-10: qty 1

## 2016-12-10 MED ORDER — ACETAMINOPHEN 650 MG RE SUPP: 650.0000 mg | Freq: Four times a day (QID) | RECTAL | Status: DC | PRN

## 2016-12-10 MED ORDER — ACETAMINOPHEN 325 MG PO TABS: 650.0000 mg | ORAL_TABLET | Freq: Four times a day (QID) | ORAL | Status: DC | PRN

## 2016-12-10 MED ORDER — GABAPENTIN 300 MG PO CAPS
300.0000 mg | ORAL_CAPSULE | Freq: Three times a day (TID) | ORAL | Status: DC
  Administered 2016-12-11 (×3): 300 mg via ORAL
  Filled 2016-12-10 (×3): qty 1

## 2016-12-10 NOTE — ED Notes (Signed)
Patient arrived to ED via [facility] from home. EMS reports: Patient reports numbness and tingling in R arm and R hand at approx 0730.

## 2016-12-10 NOTE — ED Notes (Signed)
Patient transported to CT 
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
## 2016-12-10 NOTE — ED Notes (Signed)
Pt rectal temp increased to 98.5.  Bair hugger removed.  Almyra Free PA notified
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
## 2016-12-11 DIAGNOSIS — R200 Anesthesia of skin: Secondary | ICD-10-CM | POA: Diagnosis not present

## 2016-12-11 DIAGNOSIS — I6930 Unspecified sequelae of cerebral infarction: Secondary | ICD-10-CM | POA: Diagnosis not present

## 2016-12-11 DIAGNOSIS — E785 Hyperlipidemia, unspecified: Secondary | ICD-10-CM

## 2016-12-11 DIAGNOSIS — I10 Essential (primary) hypertension: Secondary | ICD-10-CM

## 2016-12-11 DIAGNOSIS — E1159 Type 2 diabetes mellitus with other circulatory complications: Secondary | ICD-10-CM | POA: Diagnosis not present

## 2016-12-11 DIAGNOSIS — Z794 Long term (current) use of insulin: Secondary | ICD-10-CM | POA: Diagnosis not present

## 2016-12-11 LAB — GLUCOSE, CAPILLARY
GLUCOSE-CAPILLARY: 242 mg/dL — AB (ref 65–99)
Glucose-Capillary: 191 mg/dL — ABNORMAL HIGH (ref 65–99)
Glucose-Capillary: 167 mg/dL — ABNORMAL HIGH (ref 65–99)

## 2016-12-11 NOTE — Progress Notes (Signed)
Patient received from ED via bed.vital signs are stable except HR 53.patient is alert oriented x4. Skin assessment done with another nurse it is dry and flaky on both heel.patient is on telemetry. Bed in low position and side rail up 2x.patient given instruction about call light and phone.

## 2016-12-11 NOTE — Discharge Summary (Signed)
Physician Discharge Summary  Maurice Stone ION:[id number] DOB: [birth year] DOA: 12/10/2016  PCP: Garnett Farm, MD  Admit date: 12/10/2016 Discharge date: 12/11/2016  Time spent: 35 minutes  Recommendations for Outpatient Follow-up:  1. Repeat basic metabolic panel to assess electrolytes and renal function 2. Follow patient's CBGs/diabetes and make adjustments to his hypoglycemic regimen as needed. 3. Reassess blood pressure and adjust antihypertensive drugs as needed.   Discharge Diagnoses:  Principal Problem:   Numbness Active Problems:   Diabetes mellitus ([place])   Hyperlipidemia   History of CVA with residual deficit Hypertension GERD Mild dementia Depression/anxiety  Discharge Condition: Stable and improved. The patient discharged back home with resumption of his home health services as established prior to admission. [name].  Diet recommendation: Heart healthy and modified carbohydrate diet   Filed Weights   12/10/16 1338  Weight: 82.1 kg (181 lb)    History of present illness:  76 year old male with medical history significant for COPD, rheumatoid arthritis, diabetes mellitus type 2, prior CVA with chronic right-sided weakness as a residual deficit, hypertension and hyperlipidemia; who presented to emergency department secondary to awakening around 7:30 AM with numbness/paresthesias in his right hand. Patient was admitted for observation to rule out TIA/stroke.  Hospital Course:  1-right hand paresthesia/numbness: Possible TIA but presentation and symptoms duration on likely.  -MRI has rule out acute stroke -CT scan of his head also without acute intracranial abnormalities -There was no signs of infection; And his symptoms resolved spontaneously -Will continue statins, good blood pressure and diabetes control -Continue Plavix for secondary prevention. -Patient to follow with PCP in the next 10 days  2-type 2  diabetes -Continue current hypoglycemic regimen -Patient advised to follow a modified carbohydrate diet  3-hypertension -Stable overall -Advised to follow low sodium diet -Will continue current home antihypertensive regimen  4-neuropathy -Will continue the use of Neurontin  5-hyperlipidemia: -Continue Lipitor  6-depression/anxiety -Continue Zoloft  7-GERD -Continue PPI  8-mild dementia -Continue supportive care and the use of Aricept  Procedures: See below for x-ray reports  Consultations:  None  Discharge Exam: Vitals:   12/11/16 0947 12/11/16 1424  BP: 120/60 109/62  Pulse: (!) 53 63  Resp:  18  Temp:  97.8 F (36.6 C)    General: Afebrile, no chest pain, no shortness of breath, abdominal pain, dysuria and also denies further numbness or paresthesia. Feels to be back to baseline and denies any further acute complaints. Cardiovascular: S1 and S2, no rubs, no gallops Respiratory: Clear to auscultation bilaterally Abdomen: Soft, nontender, nondistended, positive bowel sounds Extremities: No edema, no cyanosis or clubbing Neurologic exam: Patient with right side weakness as residual deficit from prior stroke (arm more than leg); cranial nerve grossly intact. Alert awake and oriented 3.  Discharge Instructions   Discharge Instructions    Diet - low sodium heart healthy    Complete by:  As directed    Diet Carb Modified    Complete by:  As directed    Discharge instructions    Complete by:  As directed    Take medications as prescribed Keep herself well-hydrated Follow a heart healthy and modify carbohydrates diet Arrange follow-up with PCP in 10 days     Current Discharge Medication List    CONTINUE these medications which have NOT CHANGED   Details  amLODipine (NORVASC) 2.5 MG tablet Take 2.5 mg by mouth daily.    atorvastatin (LIPITOR) 40 MG tablet Take 40 mg  by mouth at bedtime.    clopidogrel (PLAVIX) 75 MG tablet Take 1 tablet (75 mg total)  by mouth daily. Qty: 30 tablet, Refills: 1    donepezil (ARICEPT) 10 MG tablet Take 1 tablet (10 mg total) by mouth at bedtime. Qty: 30 tablet, Refills: 1    empagliflozin (JARDIANCE) 25 MG TABS tablet Take 25 mg by mouth daily.    gabapentin (NEURONTIN) 300 MG capsule Take 300 mg by mouth 3 (three) times daily.    insulin aspart protamine- aspart (NOVOLOG MIX 70/30) (70-30) 100 UNIT/ML injection Inject 90 Units into the skin 2 (two) times daily with a meal.    Liraglutide (VICTOZA) 18 MG/3ML SOPN Inject 1.8 mg into the skin daily.    losartan (COZAAR) 25 MG tablet Take 25 mg by mouth daily.    metFORMIN (GLUCOPHAGE) 500 MG tablet Take 500 mg by mouth daily.    metoprolol succinate (TOPROL-XL) 50 MG 24 hr tablet Take 50 mg by mouth 2 (two) times daily. Take with or immediately following a meal.    omeprazole (PRILOSEC) 20 MG capsule Take 20 mg by mouth daily.    sertraline (ZOLOFT) 100 MG tablet Take 2 tablets (200 mg total) by mouth daily. Qty: 30 tablet, Refills: 1    traMADol (ULTRAM) 50 MG tablet Take 50 mg by mouth daily.       Allergies  Allergen Reactions  . Lisinopril Cough   Follow-up Information    Armour, Azalia Bilis, MD. Schedule an appointment as soon as possible for a visit in 10 day(s).   Specialty:  Internal Medicine Contact information: [address]. [place] [address] [phone]           The results of significant diagnostics from this hospitalization (including imaging, microbiology, ancillary and laboratory) are listed below for reference.    Significant Diagnostic Studies: Ct Head Wo Contrast  Result Date: 12/10/2016 CLINICAL DATA:  Right hand numbness EXAM: CT HEAD WITHOUT CONTRAST TECHNIQUE: Contiguous axial images were obtained from the base of the skull through the vertex without intravenous contrast. COMPARISON:  Head CT March 31, 2015; brain MRI March 31, 2015 FINDINGS: Brain: There is age related volume loss. There is no  intracranial mass, hemorrhage, extra-axial fluid collection, or midline shift. Decreased attenuation is noted in the inferior mid portion left centrum semiovale with prior infarct involving the superior most aspect of the basal ganglia medially and posteriorly on the left. Elsewhere there is mild patchy small vessel disease in the centra semiovale bilaterally. Decreased attenuation in the lower pons is consistent with basilar perforator small vessel disease, seen on prior MR. currently no acute infarct is evident. Vascular: No hyperdense vessels. There is calcification in each carotid siphon region as well as in the distal left vertebral artery. Skull: Bony calvarium appears intact. Sinuses/Orbits: There is mucosal thickening in the inferior right maxillary antrum. Minimal mucosal thickening is noted in the medial left maxillary antrum. There is mucosal thickening in multiple ethmoid air cells bilaterally. Other paranasal sinuses are clear. Orbits appear symmetric bilaterally. Other: Mastoid air cells are clear. IMPRESSION: Age related volume loss. Prior infarct in the superior left basal ganglia extending into the inferior left centrum semiovale. There is patchy small vessel disease in the centra semiovale bilaterally as well as in the pons in the basilar perforator distribution. No acute appearing infarct. No mass, hemorrhage, or extra-axial fluid collection. There are foci of arterial vascular calcification. There are multiple foci of paranasal sinus disease. Electronically Signed   By:  Lowella Grip III M.D.   On: 12/10/2016 14:21   Mr Brain Wo Contrast  Result Date: 12/10/2016 CLINICAL DATA:  RIGHT-sided weakness, somnolent. History of stroke, hypertension, diabetes. EXAM: MRI HEAD WITHOUT CONTRAST TECHNIQUE: Multiplanar, multiecho pulse sequences of the brain and surrounding structures were obtained without intravenous contrast. COMPARISON:  CT HEAD Dec 10, 2016 at 1404 hours and MRI of the head  March 31, 2015 FINDINGS: BRAIN: No reduced diffusion to suggest acute ischemia. No susceptibility artifact to suggest hemorrhage. Old LEFT basal ganglia/ corona radiata lacunar infarct with mild ex vacuo dilatation LEFT lateral ventricle, ventricles and sulci are otherwise normal for patient's age. Patchy supratentorial and pontine white matter FLAIR T2 hyperintensities. No suspicious parenchymal signal, masses or mass effect. No abnormal extra-axial fluid collections. VASCULAR: Normal major intracranial vascular flow voids present at skull base. SKULL AND UPPER CERVICAL SPINE: No abnormal sellar expansion. No suspicious calvarial bone marrow signal. Craniocervical junction maintained. SINUSES/ORBITS: Mild paranasal sinus mucosal thickening, mastoid air cells are well aerated. The included ocular globes and orbital contents are non-suspicious. OTHER: None. IMPRESSION: No acute intracranial process. Stable examination including old LEFT basal ganglia lacunar infarct and mild to moderate chronic small vessel ischemic disease. Electronically Signed   By: Elon Alas M.D.   On: 12/10/2016 15:19   Dg Chest Port 1 View  Result Date: 12/10/2016 CLINICAL DATA:  Altered mental status. EXAM: PORTABLE CHEST 1 VIEW COMPARISON:  03/31/2015 FINDINGS: Upper limits normal heart size again noted. There is no evidence of focal airspace disease, pulmonary edema, suspicious pulmonary nodule/mass, pleural effusion, or pneumothorax. No acute bony abnormalities are identified. IMPRESSION: No active disease. Electronically Signed   By: Margarette Canada M.D.   On: 12/10/2016 15:51    Microbiology: Recent Results (from the past 240 hour(s))  Blood culture (routine x 2)     Status: None (Preliminary result)   Collection Time: 12/10/16  6:44 PM  Result Value Ref Range Status   Specimen Description BLOOD RIGHT FOREARM  Final   Special Requests IN PEDIATRIC BOTTLE Blood Culture adequate volume  Final   Culture NO GROWTH < 24  HOURS  Final   Report Status PENDING  Incomplete  Blood culture (routine x 2)     Status: None (Preliminary result)   Collection Time: 12/10/16  7:49 PM  Result Value Ref Range Status   Specimen Description BLOOD LEFT WRIST  Final   Special Requests IN PEDIATRIC BOTTLE Blood Culture adequate volume  Final   Culture NO GROWTH < 24 HOURS  Final   Report Status PENDING  Incomplete     Labs: Basic Metabolic Panel:  Recent Labs Lab 12/10/16 1433 12/10/16 1543  NA 138 139  K 5.7* 3.9  CL 100* 102  CO2  --  29  GLUCOSE 177* 178*  BUN 15 9  CREATININE 0.90 0.90  CALCIUM  --  9.2   Liver Function Tests:  Recent Labs Lab 12/10/16 1543  AST 30  ALT 25  ALKPHOS 95  BILITOT 1.1  PROT 7.0  ALBUMIN 3.8   CBC:  Recent Labs Lab 12/10/16 1355 12/10/16 1433  WBC 8.9  --   NEUTROABS 4.3  --   HGB 13.0 13.6  HCT 40.4 40.0  MCV 91.8  --   PLT 126*  --     CBG:  Recent Labs Lab 12/10/16 2328 12/11/16 0808 12/11/16 1206  GLUCAP 200* 191* 242*    Signed:  Barton Dubois MD.  [facility] 12/11/2016, 3:57 PM

## 2016-12-11 NOTE — Care Management Note (Signed)
Case Management Note  Patient Details  Name: Maurice Stone MRN: [id number] Date of Birth: April 29, 1941  Subjective/Objective:                 Spoke with patient. He states he has [facility] RN and HHA through [place] prior to admission. CM faxed [facility] RN PT orders, facesheet and DC summery to [place] at [phone]. No other CM needs identified.   Action/Plan:  DC to home. Expected Discharge Date:  12/11/16               Expected Discharge Plan:  [place]  In-House Referral:     Discharge planning Services  CM Consult  Post Acute Care Choice:  Home Health Choice offered to:  Patient  DME Arranged:    DME Agency:     HH Arranged:    [place] Agency:     Status of Service:  Completed, signed off  If discussed at [employer], dates discussed:    Additional Comments:  Carles Collet, RN 12/11/2016, 4:32 PM

## 2016-12-11 NOTE — Progress Notes (Signed)
Maurice Stone to be D/C'd to home per MD order.  Discussed with the patient and all questions fully answered.  VSS, Skin clean, dry and intact without evidence of skin break down, no evidence of skin tears noted. IV catheter discontinued intact. Site without signs and symptoms of complications. Dressing and pressure applied.  An After Visit Summary was printed and given to the patient.   D/c education completed with patient/family including follow up instructions, medication list, d/c activities limitations if indicated, with other d/c instructions as indicated by MD - patient able to verbalize understanding, all questions fully answered.   Patient instructed to return to ED, call 911, or call MD for any changes in condition.   Patient escorted via [place], and D/C home via private auto.  Morley Kos Price 12/11/2016 7:30 PM

## 2016-12-15 LAB — CULTURE, BLOOD (ROUTINE X 2)
CULTURE: NO GROWTH
Culture: NO GROWTH
Special Requests: ADEQUATE
Special Requests: ADEQUATE

## 2017-05-01 ENCOUNTER — Inpatient Hospital Stay (HOSPITAL_COMMUNITY)
Admission: EM | Admit: 2017-05-01 | Discharge: 2017-05-08 | DRG: 872 | Disposition: A | Discharge status: Home / Self Care | Payer: Medicare Other | Attending: Oncology | Admitting: Oncology

## 2017-05-01 ENCOUNTER — Encounter (HOSPITAL_COMMUNITY): Payer: Self-pay | Admitting: Emergency Medicine

## 2017-05-01 ENCOUNTER — Emergency Department (HOSPITAL_COMMUNITY): Payer: Medicare Other

## 2017-05-01 DIAGNOSIS — F0390 Unspecified dementia without behavioral disturbance: Secondary | ICD-10-CM | POA: Diagnosis present

## 2017-05-01 DIAGNOSIS — E785 Hyperlipidemia, unspecified: Secondary | ICD-10-CM | POA: Diagnosis present

## 2017-05-01 DIAGNOSIS — Z23 Encounter for immunization: Secondary | ICD-10-CM

## 2017-05-01 DIAGNOSIS — Z96652 Presence of left artificial knee joint: Secondary | ICD-10-CM | POA: Diagnosis present

## 2017-05-01 DIAGNOSIS — E1141 Type 2 diabetes mellitus with diabetic mononeuropathy: Secondary | ICD-10-CM | POA: Diagnosis present

## 2017-05-01 DIAGNOSIS — Z79899 Other long term (current) drug therapy: Secondary | ICD-10-CM

## 2017-05-01 DIAGNOSIS — R7881 Bacteremia: Secondary | ICD-10-CM | POA: Diagnosis present

## 2017-05-01 DIAGNOSIS — Z1611 Resistance to penicillins: Secondary | ICD-10-CM | POA: Diagnosis present

## 2017-05-01 DIAGNOSIS — A4151 Sepsis due to Escherichia coli [E. coli]: Secondary | ICD-10-CM | POA: Diagnosis not present

## 2017-05-01 DIAGNOSIS — E860 Dehydration: Secondary | ICD-10-CM | POA: Diagnosis present

## 2017-05-01 DIAGNOSIS — N179 Acute kidney failure, unspecified: Secondary | ICD-10-CM | POA: Diagnosis present

## 2017-05-01 DIAGNOSIS — Z87891 Personal history of nicotine dependence: Secondary | ICD-10-CM

## 2017-05-01 DIAGNOSIS — Z8673 Personal history of transient ischemic attack (TIA), and cerebral infarction without residual deficits: Secondary | ICD-10-CM

## 2017-05-01 DIAGNOSIS — A419 Sepsis, unspecified organism: Secondary | ICD-10-CM | POA: Diagnosis present

## 2017-05-01 DIAGNOSIS — R739 Hyperglycemia, unspecified: Secondary | ICD-10-CM | POA: Diagnosis present

## 2017-05-01 DIAGNOSIS — I2510 Atherosclerotic heart disease of native coronary artery without angina pectoris: Secondary | ICD-10-CM | POA: Diagnosis present

## 2017-05-01 DIAGNOSIS — J449 Chronic obstructive pulmonary disease, unspecified: Secondary | ICD-10-CM | POA: Diagnosis present

## 2017-05-01 DIAGNOSIS — R32 Unspecified urinary incontinence: Secondary | ICD-10-CM | POA: Diagnosis present

## 2017-05-01 DIAGNOSIS — K649 Unspecified hemorrhoids: Secondary | ICD-10-CM | POA: Diagnosis present

## 2017-05-01 DIAGNOSIS — E1140 Type 2 diabetes mellitus with diabetic neuropathy, unspecified: Secondary | ICD-10-CM | POA: Diagnosis present

## 2017-05-01 DIAGNOSIS — F329 Major depressive disorder, single episode, unspecified: Secondary | ICD-10-CM | POA: Diagnosis present

## 2017-05-01 DIAGNOSIS — I10 Essential (primary) hypertension: Secondary | ICD-10-CM | POA: Diagnosis present

## 2017-05-01 DIAGNOSIS — IMO0002 Reserved for concepts with insufficient information to code with codable children: Secondary | ICD-10-CM

## 2017-05-01 DIAGNOSIS — B370 Candidal stomatitis: Secondary | ICD-10-CM | POA: Diagnosis present

## 2017-05-01 DIAGNOSIS — K644 Residual hemorrhoidal skin tags: Secondary | ICD-10-CM | POA: Diagnosis present

## 2017-05-01 DIAGNOSIS — K219 Gastro-esophageal reflux disease without esophagitis: Secondary | ICD-10-CM | POA: Diagnosis present

## 2017-05-01 DIAGNOSIS — E1165 Type 2 diabetes mellitus with hyperglycemia: Secondary | ICD-10-CM | POA: Diagnosis present

## 2017-05-01 DIAGNOSIS — Z794 Long term (current) use of insulin: Secondary | ICD-10-CM

## 2017-05-01 DIAGNOSIS — F419 Anxiety disorder, unspecified: Secondary | ICD-10-CM | POA: Diagnosis present

## 2017-05-01 DIAGNOSIS — M069 Rheumatoid arthritis, unspecified: Secondary | ICD-10-CM | POA: Diagnosis present

## 2017-05-01 DIAGNOSIS — J9811 Atelectasis: Secondary | ICD-10-CM | POA: Diagnosis not present

## 2017-05-01 DIAGNOSIS — N390 Urinary tract infection, site not specified: Secondary | ICD-10-CM | POA: Diagnosis present

## 2017-05-01 DIAGNOSIS — Z7902 Long term (current) use of antithrombotics/antiplatelets: Secondary | ICD-10-CM

## 2017-05-01 HISTORY — DX: Unspecified dementia, unspecified severity, without behavioral disturbance, psychotic disturbance, mood disturbance, and anxiety: F03.90

## 2017-05-01 HISTORY — DX: Type 2 diabetes mellitus without complications: E11.9

## 2017-05-01 LAB — URINALYSIS, ROUTINE W REFLEX MICROSCOPIC
BILIRUBIN URINE: NEGATIVE
Ketones, ur: 5 mg/dL — AB
Nitrite: NEGATIVE
PH: 6 (ref 5.0–8.0)
Protein, ur: NEGATIVE mg/dL
Specific Gravity, Urine: 1.024 (ref 1.005–1.030)

## 2017-05-01 LAB — BASIC METABOLIC PANEL
Anion gap: 12 (ref 5–15)
BUN: 27 mg/dL — AB (ref 6–20)
CALCIUM: 8.7 mg/dL — AB (ref 8.9–10.3)
CHLORIDE: 97 mmol/L — AB (ref 101–111)
CO2: 24 mmol/L (ref 22–32)
CREATININE: 1.35 mg/dL — AB (ref 0.61–1.24)
GFR calc Af Amer: 58 mL/min — ABNORMAL LOW (ref >60)
GFR calc non Af Amer: 50 mL/min — ABNORMAL LOW (ref >60)
GLUCOSE: 630 mg/dL — AB (ref 65–99)
Potassium: 4.8 mmol/L (ref 3.5–5.1)
Sodium: 133 mmol/L — ABNORMAL LOW (ref 135–145)

## 2017-05-01 LAB — GLUCOSE, CAPILLARY: GLUCOSE-CAPILLARY: 390 mg/dL — AB (ref 65–99)

## 2017-05-01 LAB — CBG MONITORING, ED
Glucose-Capillary: 565 mg/dL (ref 65–99)
Glucose-Capillary: 517 mg/dL (ref 65–99)
Glucose-Capillary: 408 mg/dL — ABNORMAL HIGH (ref 65–99)

## 2017-05-01 LAB — CG4 I-STAT (LACTIC ACID): Lactic Acid, Venous: 2.93 mmol/L (ref 0.5–1.9)

## 2017-05-01 LAB — I-STAT CG4 LACTIC ACID, ED: Lactic Acid, Venous: 2.21 mmol/L (ref 0.5–1.9)

## 2017-05-01 LAB — HEMOGLOBIN A1C
HEMOGLOBIN A1C: 8.8 % — AB (ref 4.8–5.6)
Mean Plasma Glucose: 205.86 mg/dL

## 2017-05-01 LAB — CBC
HCT: 36.1 % — ABNORMAL LOW (ref 39.0–52.0)
Hemoglobin: 12 g/dL — ABNORMAL LOW (ref 13.0–17.0)
MCH: 30.4 pg (ref 26.0–34.0)
MCHC: 33.2 g/dL (ref 30.0–36.0)
MCV: 91.4 fL (ref 78.0–100.0)
PLATELETS: 200 10*3/uL (ref 150–400)
RBC: 3.95 MIL/uL — AB (ref 4.22–5.81)
RDW: 13.9 % (ref 11.5–15.5)
WBC: 12.8 10*3/uL — ABNORMAL HIGH (ref 4.0–10.5)

## 2017-05-01 MED ORDER — DEXTROSE 5 % IV SOLN
1.0000 g | Freq: Once | INTRAVENOUS | Status: AC
  Administered 2017-05-01: 1 g via INTRAVENOUS
  Filled 2017-05-01: qty 10

## 2017-05-01 MED ORDER — ACETAMINOPHEN 325 MG PO TABS
650.0000 mg | ORAL_TABLET | Freq: Four times a day (QID) | ORAL | Status: DC | PRN
  Administered 2017-05-02: 650 mg via ORAL
  Filled 2017-05-01 (×2): qty 2

## 2017-05-01 MED ORDER — DONEPEZIL HCL 10 MG PO TABS
10.0000 mg | ORAL_TABLET | Freq: Every day | ORAL | Status: DC
  Administered 2017-05-02 – 2017-05-08 (×6): 10 mg via ORAL
  Filled 2017-05-01 (×6): qty 1

## 2017-05-01 MED ORDER — INSULIN ASPART 100 UNIT/ML ~~LOC~~ SOLN: 0.0000 [IU] | Freq: Three times a day (TID) | SUBCUTANEOUS | Status: DC

## 2017-05-01 MED ORDER — INSULIN ASPART PROT & ASPART (70-30 MIX) 100 UNIT/ML ~~LOC~~ SUSP
40.0000 [IU] | Freq: Two times a day (BID) | SUBCUTANEOUS | Status: DC
  Administered 2017-05-01 – 2017-05-02 (×2): 40 [IU] via SUBCUTANEOUS
  Filled 2017-05-01 (×2): qty 10

## 2017-05-01 MED ORDER — INSULIN ASPART 100 UNIT/ML ~~LOC~~ SOLN
10.0000 [IU] | Freq: Once | SUBCUTANEOUS | Status: AC
  Administered 2017-05-01: 10 [IU] via INTRAVENOUS
  Filled 2017-05-01: qty 1

## 2017-05-01 MED ORDER — PANTOPRAZOLE SODIUM 40 MG PO TBEC
40.0000 mg | DELAYED_RELEASE_TABLET | Freq: Every day | ORAL | Status: DC
  Administered 2017-05-02 – 2017-05-08 (×7): 40 mg via ORAL
  Filled 2017-05-01 (×7): qty 1

## 2017-05-01 MED ORDER — INSULIN ASPART PROT & ASPART (70-30 MIX) 100 UNIT/ML ~~LOC~~ SUSP: 40.0000 [IU] | Freq: Two times a day (BID) | SUBCUTANEOUS | Status: DC

## 2017-05-01 MED ORDER — SODIUM CHLORIDE 0.9 % IV BOLUS (SEPSIS)
750.0000 mL | Freq: Once | INTRAVENOUS | Status: AC
  Administered 2017-05-01: 750 mL via INTRAVENOUS

## 2017-05-01 MED ORDER — ENOXAPARIN SODIUM 40 MG/0.4ML ~~LOC~~ SOLN
40.0000 mg | SUBCUTANEOUS | Status: DC
  Administered 2017-05-01 – 2017-05-08 (×7): 40 mg via SUBCUTANEOUS
  Filled 2017-05-01 (×7): qty 0.4

## 2017-05-01 MED ORDER — SODIUM CHLORIDE 0.9 % IV BOLUS (SEPSIS)
1000.0000 mL | Freq: Once | INTRAVENOUS | Status: AC
  Administered 2017-05-01: 1000 mL via INTRAVENOUS

## 2017-05-01 MED ORDER — GABAPENTIN 300 MG PO CAPS
600.0000 mg | ORAL_CAPSULE | Freq: Three times a day (TID) | ORAL | Status: DC
  Administered 2017-05-01 – 2017-05-08 (×20): 600 mg via ORAL
  Filled 2017-05-01 (×20): qty 2

## 2017-05-01 MED ORDER — HYDROCORTISONE 2.5 % RE CREA
TOPICAL_CREAM | Freq: Four times a day (QID) | RECTAL | Status: DC
  Administered 2017-05-01 – 2017-05-05 (×10): via RECTAL
  Administered 2017-05-05: 1 via RECTAL
  Administered 2017-05-05: 15:00:00 via RECTAL
  Administered 2017-05-06: 1 via RECTAL
  Administered 2017-05-06 – 2017-05-08 (×9): via RECTAL
  Filled 2017-05-01: qty 28.35

## 2017-05-01 MED ORDER — SERTRALINE HCL 100 MG PO TABS
100.0000 mg | ORAL_TABLET | Freq: Every day | ORAL | Status: DC
  Administered 2017-05-02 – 2017-05-08 (×7): 100 mg via ORAL
  Filled 2017-05-01 (×7): qty 1

## 2017-05-01 MED ORDER — ONDANSETRON HCL 4 MG/2ML IJ SOLN: 4.0000 mg | Freq: Three times a day (TID) | INTRAMUSCULAR | Status: DC | PRN

## 2017-05-01 MED ORDER — AMLODIPINE BESYLATE 2.5 MG PO TABS: 2.5000 mg | ORAL_TABLET | Freq: Every day | ORAL | Status: DC

## 2017-05-01 MED ORDER — CLOPIDOGREL BISULFATE 75 MG PO TABS
75.0000 mg | ORAL_TABLET | Freq: Every day | ORAL | Status: DC
  Administered 2017-05-02 – 2017-05-08 (×7): 75 mg via ORAL
  Filled 2017-05-01 (×7): qty 1

## 2017-05-01 MED ORDER — ATORVASTATIN CALCIUM 40 MG PO TABS
40.0000 mg | ORAL_TABLET | Freq: Every day | ORAL | Status: DC
  Administered 2017-05-01 – 2017-05-08 (×7): 40 mg via ORAL
  Filled 2017-05-01 (×7): qty 1

## 2017-05-01 MED ORDER — ACETAMINOPHEN 650 MG RE SUPP: 650.0000 mg | Freq: Four times a day (QID) | RECTAL | Status: DC | PRN
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
## 2017-05-01 NOTE — Progress Notes (Signed)
RN paged Dr. Tarri Abernethy to make him aware of CBG of 390, instructed to give PM dose of Insulin 70/30 as ordered for meal time, patient did not receive dose at 1900.  RN will continue to monitor patient and make MD aware of any changes.  P.J. Linus Mako, RN

## 2017-05-01 NOTE — ED Notes (Signed)
Attempted to call report

## 2017-05-01 NOTE — ED Notes (Signed)
Pt received dinner meal tray.

## 2017-05-01 NOTE — H&P (Signed)
Date: 05/01/2017               Patient Name:  Maurice Stone MRN: [id number]  DOB: 06-22-1941 Age / Sex: 76 y.o., male   PCP: Armour, Azalia Bilis, MD         Medical Service: Internal Medicine Teaching Service         Attending Physician: Dr. Annia Belt, MD    First Contact: Dr. Aggie Hacker Pager: [phone]  Second Contact: Dr. Jari Favre Pager: [phone]       After Hours (After 5p/  First Contact Pager: [phone]  weekends / holidays): Second Contact Pager: [phone]   Chief Complaint: dysuria, weakness, buttock pain  History of Present Illness:  76 yo male with PMHx Type 2 DM, HTN, and prior CVA (2002, 2015) presenting with complaints of back and buttock pain, as well as pain with urination. He also states he has not been eating and seems to have a lot of stress at home. It was difficult obtaining a history because the patient had to be redirected frequently. He states for about one week he has not been feeling well. He endorses dysuria, frequency with episodes of incontinence, and hematuria, as well as buttock pain that he attributes to hemorrhoids. He also states he had diarrhea, but it is difficult for him to assess because he wears adult diapers. He denies nausea, vomiting, fevers, or chills. He has not been taking his insulin because of multiple stressors at home. The patient got very tearful and upset during the interview.   In the ED, the patient's vital signs were stable. He was afebrile, HR 80s, BP soft at 116/81. Chest xray was negative for acute cardiopulmonary disease, EKG was normal sinus rhythm no acute ischemic changes. Urine analysis was positive for leukocytes, with TNTC WBC and bacteria present. He was also found to have a CBGs of 565, 517. CBC with mild leukocytosis of 12.8, elevated lactic acid of 2.21, and elevated creatinine of 1.35 (baseline 0.9). He was started on Ceftriaxone and given about 3 liters of NS. He received 10 units of Lantus. Code sepsis was activated.    Meds:  Current Meds  Medication Sig  . gabapentin (NEURONTIN) 300 MG capsule Take 600 mg by mouth 3 (three) times daily.      Allergies: Allergies as of 05/01/2017 - Review Complete 05/01/2017  Allergen Reaction Noted  . Lisinopril Cough 01/15/2014   Past Medical History:  Diagnosis Date  . Arthritis    RA  . COPD (chronic obstructive pulmonary disease) ([place])   . Coronary artery disease   . Diabetes mellitus without complication (HCC)    insulin dependent   . GERD (gastroesophageal reflux disease)   . Hypertension   . Refusal of blood transfusions as patient is Jehovah's Witness   . Stroke [facility])     Family History:  Family History  Problem Relation Age of Onset  . Hypertension Unknown   . Diabetes Unknown     Social History:  Social History  Substance Use Topics  . Smoking status: Former Smoker    Years: 10.00    Types: Cigarettes    Quit date: 01/17/1973  . Smokeless tobacco: Never Used  . Alcohol use 0.6 oz/week    1 Cans of beer per week     Comment: Occasionally    Review of Systems: A complete ROS was negative except as per HPI.   Physical Exam: Blood pressure 119/75, pulse 87, temperature 97.9 F (36.6 C),  temperature source Oral, resp. rate 18, height 5\' 11"  (1.803 m), weight 185 lb (83.9 kg), SpO2 99 %.   General: Laying in bed comfortably, NAD, became tearful as interview and exam progressed HEENT: [place]/AT, mucous membranes moist, EOMI, no scleral icterus, PERRL Cardiac: RRR, No R/M/G appreciated Pulm: normal effort, CTAB Abd: soft, TTP right and left lower quadrant, non distended, BS normal, no CVA tenderness, rectal exam +hemorrhoids  Ext: extremities well perfused, no peripheral edema Neuro: alert and oriented X3, cranial nerves II-XII grossly intact  EKG: personally reviewed my interpretation is sinus rhythm, possible left atrial enlargement, QT corrected not prolonged  CXR: personally reviewed my interpretation is no active  cardiopulmonary disease.   Assessment & Plan by Problem: Active Problems:   Hyperglycemia  Hypergylcemia  Type 2 Diabetes  Hyperglycemia most likely secondary to insulin non-adherence for the past several days and in the setting of UTI. The patient's home regimen includes Novolog 70/30 80 units BID, Metformin 500 mg BID, and Empagliflozin 25 mg. Most recent A1C documented in 01/2015 9.9. CBG 565>>517>>408 s/p aggressive IV fluid resuscitation and 10 units of Lantus.  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  -Start on 40 units of Lantus BID with meals -POC CBG monitoring QID -Continue 600 mg TID -Follow up Hgb A1C  Urinary Tract Infection Patient with symptoms of dysuria, frequency, and hematuria in the setting of a dirty UA. Patient also has mild leukocytosis 12.8 and lactic acid of 2.21 consistent with infection, but has been afebrile with stable vital signs. Received first dose of Ceftriaxone in the ED.  -Ceftriaxone 1 g q24 hours -Urine culture f/u -CBC in AM  AKI Creatinine elevated from baseline of 0.9 >> 1.35 most likely pre-renal due to dehydration in setting of hyperglycemia. Received 3 liters of NS in ED.  -BMET in AM  Hemorrhoids  -Anusol, QID   Hypertension -Start Amlodipine 2.5 mg -Hold: losartan 25 mg, metoprolol 50 mg   HLD, Prior CVA -Lipitor 40 mg  -Continue Plavix 75 mg daily  Depression/Anxiety, Mild dementia Patient's anxiety and depression do not seem well controlled or are currently exacerbated by the patient's life stressors, which were difficult to understand.  -Zoloft 100 mg  -Continue Aricept 10 mg daily -Consider psychiatry consult if patient is interested   GERD -Continue Protonix 40 mg daily   Dispo: Admit patient to Observation with expected length of stay less than 2 midnights.  Signed: Melanee Spry, MD 05/01/2017, 7:05 PM  Pager: [phone]

## 2017-05-01 NOTE — Progress Notes (Signed)
RN notified Dr. Tarri Abernethy about patients lactic acid level of 2.93 and temp of 99.4, also made him aware that patient has no continued fluid orders.  Dr. Tarri Abernethy states he will review patient's chart and place orders.  RN will continue to monitor patient and report any changes in condition to MD immediately.  P.J. Linus Mako, RN

## 2017-05-01 NOTE — ED Notes (Signed)
Patient transported to X-ray 

## 2017-05-01 NOTE — ED Triage Notes (Signed)
Patient comes from [facility] Patient presents to the ED with complaints back pain. Patient reports he has had burning with urination, cloudy urine. Rates pain 5/10. Per EMS when check patient CBG it read" high"  Patient given 500 CC of normal Saline. Patient alert and oriented x4 arrival.

## 2017-05-01 NOTE — ED Notes (Signed)
[facility] contacted to activate Code Sepsis 

## 2017-05-02 ENCOUNTER — Observation Stay (HOSPITAL_BASED_OUTPATIENT_CLINIC_OR_DEPARTMENT_OTHER): Payer: Medicare Other

## 2017-05-02 ENCOUNTER — Encounter (HOSPITAL_COMMUNITY): Payer: Self-pay | Admitting: General Practice

## 2017-05-02 DIAGNOSIS — I10 Essential (primary) hypertension: Secondary | ICD-10-CM | POA: Diagnosis present

## 2017-05-02 DIAGNOSIS — K649 Unspecified hemorrhoids: Secondary | ICD-10-CM

## 2017-05-02 DIAGNOSIS — I5030 Unspecified diastolic (congestive) heart failure: Secondary | ICD-10-CM

## 2017-05-02 DIAGNOSIS — Z79899 Other long term (current) drug therapy: Secondary | ICD-10-CM | POA: Diagnosis not present

## 2017-05-02 DIAGNOSIS — Z7902 Long term (current) use of antithrombotics/antiplatelets: Secondary | ICD-10-CM | POA: Diagnosis not present

## 2017-05-02 DIAGNOSIS — F0390 Unspecified dementia without behavioral disturbance: Secondary | ICD-10-CM | POA: Diagnosis present

## 2017-05-02 DIAGNOSIS — N390 Urinary tract infection, site not specified: Secondary | ICD-10-CM | POA: Diagnosis present

## 2017-05-02 DIAGNOSIS — A419 Sepsis, unspecified organism: Secondary | ICD-10-CM | POA: Diagnosis present

## 2017-05-02 DIAGNOSIS — B9620 Unspecified Escherichia coli [E. coli] as the cause of diseases classified elsewhere: Secondary | ICD-10-CM | POA: Diagnosis present

## 2017-05-02 DIAGNOSIS — F329 Major depressive disorder, single episode, unspecified: Secondary | ICD-10-CM | POA: Diagnosis present

## 2017-05-02 DIAGNOSIS — A4151 Sepsis due to Escherichia coli [E. coli]: Secondary | ICD-10-CM | POA: Diagnosis present

## 2017-05-02 DIAGNOSIS — E860 Dehydration: Secondary | ICD-10-CM | POA: Diagnosis present

## 2017-05-02 DIAGNOSIS — K219 Gastro-esophageal reflux disease without esophagitis: Secondary | ICD-10-CM | POA: Diagnosis present

## 2017-05-02 DIAGNOSIS — Z8673 Personal history of transient ischemic attack (TIA), and cerebral infarction without residual deficits: Secondary | ICD-10-CM | POA: Diagnosis not present

## 2017-05-02 DIAGNOSIS — Z23 Encounter for immunization: Secondary | ICD-10-CM | POA: Diagnosis present

## 2017-05-02 DIAGNOSIS — R32 Unspecified urinary incontinence: Secondary | ICD-10-CM | POA: Diagnosis present

## 2017-05-02 DIAGNOSIS — E1140 Type 2 diabetes mellitus with diabetic neuropathy, unspecified: Secondary | ICD-10-CM | POA: Diagnosis present

## 2017-05-02 DIAGNOSIS — F419 Anxiety disorder, unspecified: Secondary | ICD-10-CM | POA: Diagnosis present

## 2017-05-02 DIAGNOSIS — Z794 Long term (current) use of insulin: Secondary | ICD-10-CM

## 2017-05-02 DIAGNOSIS — J449 Chronic obstructive pulmonary disease, unspecified: Secondary | ICD-10-CM | POA: Diagnosis present

## 2017-05-02 DIAGNOSIS — J9811 Atelectasis: Secondary | ICD-10-CM | POA: Diagnosis not present

## 2017-05-02 DIAGNOSIS — Z96652 Presence of left artificial knee joint: Secondary | ICD-10-CM | POA: Diagnosis present

## 2017-05-02 DIAGNOSIS — R7881 Bacteremia: Secondary | ICD-10-CM

## 2017-05-02 DIAGNOSIS — E1165 Type 2 diabetes mellitus with hyperglycemia: Secondary | ICD-10-CM

## 2017-05-02 DIAGNOSIS — R739 Hyperglycemia, unspecified: Secondary | ICD-10-CM

## 2017-05-02 DIAGNOSIS — IMO0002 Reserved for concepts with insufficient information to code with codable children: Secondary | ICD-10-CM

## 2017-05-02 DIAGNOSIS — E1141 Type 2 diabetes mellitus with diabetic mononeuropathy: Secondary | ICD-10-CM

## 2017-05-02 DIAGNOSIS — B370 Candidal stomatitis: Secondary | ICD-10-CM | POA: Diagnosis present

## 2017-05-02 DIAGNOSIS — E785 Hyperlipidemia, unspecified: Secondary | ICD-10-CM | POA: Diagnosis present

## 2017-05-02 DIAGNOSIS — N179 Acute kidney failure, unspecified: Secondary | ICD-10-CM | POA: Diagnosis present

## 2017-05-02 DIAGNOSIS — M069 Rheumatoid arthritis, unspecified: Secondary | ICD-10-CM | POA: Diagnosis present

## 2017-05-02 LAB — BLOOD CULTURE ID PANEL (REFLEXED)
ACINETOBACTER BAUMANNII: NOT DETECTED
CANDIDA ALBICANS: NOT DETECTED
CANDIDA KRUSEI: NOT DETECTED
CANDIDA PARAPSILOSIS: NOT DETECTED
Candida glabrata: NOT DETECTED
Candida tropicalis: NOT DETECTED
Carbapenem resistance: NOT DETECTED
ENTEROBACTER CLOACAE COMPLEX: NOT DETECTED
ENTEROBACTERIACEAE SPECIES: DETECTED — AB
ESCHERICHIA COLI: DETECTED — AB
Enterococcus species: NOT DETECTED
Haemophilus influenzae: NOT DETECTED
KLEBSIELLA OXYTOCA: NOT DETECTED
KLEBSIELLA PNEUMONIAE: NOT DETECTED
Listeria monocytogenes: NOT DETECTED
Neisseria meningitidis: NOT DETECTED
PSEUDOMONAS AERUGINOSA: NOT DETECTED
Proteus species: NOT DETECTED
STREPTOCOCCUS PYOGENES: NOT DETECTED
Serratia marcescens: NOT DETECTED
Staphylococcus aureus (BCID): NOT DETECTED
Staphylococcus species: NOT DETECTED
Streptococcus agalactiae: NOT DETECTED
Streptococcus pneumoniae: NOT DETECTED
Streptococcus species: NOT DETECTED

## 2017-05-02 LAB — BASIC METABOLIC PANEL
ANION GAP: 8 (ref 5–15)
BUN: 16 mg/dL (ref 6–20)
CALCIUM: 8.3 mg/dL — AB (ref 8.9–10.3)
CO2: 25 mmol/L (ref 22–32)
CREATININE: 1.07 mg/dL (ref 0.61–1.24)
Chloride: 108 mmol/L (ref 101–111)
GFR calc Af Amer: >60 mL/min (ref >60)
GFR calc non Af Amer: >60 mL/min (ref >60)
GLUCOSE: 276 mg/dL — AB (ref 65–99)
Potassium: 3.6 mmol/L (ref 3.5–5.1)
Sodium: 141 mmol/L (ref 135–145)

## 2017-05-02 LAB — CBC
HCT: 35 % — ABNORMAL LOW (ref 39.0–52.0)
HEMOGLOBIN: 11.6 g/dL — AB (ref 13.0–17.0)
MCH: 29.9 pg (ref 26.0–34.0)
MCHC: 33.1 g/dL (ref 30.0–36.0)
MCV: 90.2 fL (ref 78.0–100.0)
Platelets: 206 10*3/uL (ref 150–400)
RBC: 3.88 MIL/uL — ABNORMAL LOW (ref 4.22–5.81)
RDW: 13.8 % (ref 11.5–15.5)
WBC: 12.4 10*3/uL — ABNORMAL HIGH (ref 4.0–10.5)

## 2017-05-02 LAB — MRSA PCR SCREENING: MRSA by PCR: NEGATIVE

## 2017-05-02 LAB — GLUCOSE, CAPILLARY
GLUCOSE-CAPILLARY: 312 mg/dL — AB (ref 65–99)
Glucose-Capillary: 292 mg/dL — ABNORMAL HIGH (ref 65–99)
Glucose-Capillary: 279 mg/dL — ABNORMAL HIGH (ref 65–99)
Glucose-Capillary: 92 mg/dL (ref 65–99)

## 2017-05-02 LAB — LACTIC ACID, PLASMA: Lactic Acid, Venous: 1.3 mmol/L (ref 0.5–1.9)

## 2017-05-02 LAB — ECHOCARDIOGRAM COMPLETE
HEIGHTINCHES: 71 in
WEIGHTICAEL: 2960 [oz_av]

## 2017-05-02 MED ORDER — NYSTATIN 100000 UNIT/ML MT SUSP
5.0000 mL | Freq: Four times a day (QID) | OROMUCOSAL | Status: DC
  Administered 2017-05-02 – 2017-05-08 (×26): 500000 [IU] via ORAL
  Filled 2017-05-02 (×26): qty 5

## 2017-05-02 MED ORDER — INSULIN ASPART 100 UNIT/ML ~~LOC~~ SOLN
0.0000 [IU] | Freq: Three times a day (TID) | SUBCUTANEOUS | Status: DC
  Administered 2017-05-02: 11 [IU] via SUBCUTANEOUS
  Administered 2017-05-03 (×2): 5 [IU] via SUBCUTANEOUS
  Administered 2017-05-03 – 2017-05-04 (×3): 3 [IU] via SUBCUTANEOUS
  Administered 2017-05-04: 5 [IU] via SUBCUTANEOUS
  Administered 2017-05-05: 3 [IU] via SUBCUTANEOUS
  Administered 2017-05-05: 5 [IU] via SUBCUTANEOUS
  Administered 2017-05-06 (×2): 8 [IU] via SUBCUTANEOUS
  Administered 2017-05-07 (×2): 5 [IU] via SUBCUTANEOUS
  Administered 2017-05-07: 8 [IU] via SUBCUTANEOUS
  Administered 2017-05-08: 2 [IU] via SUBCUTANEOUS
  Administered 2017-05-08: 5 [IU] via SUBCUTANEOUS

## 2017-05-02 MED ORDER — INFLUENZA VAC SPLIT HIGH-DOSE 0.5 ML IM SUSY
0.5000 mL | PREFILLED_SYRINGE | INTRAMUSCULAR | Status: AC
  Administered 2017-05-03: 0.5 mL via INTRAMUSCULAR
  Filled 2017-05-02: qty 0.5

## 2017-05-02 MED ORDER — DEXTROSE 5 % IV SOLN
2.0000 g | INTRAVENOUS | Status: DC
  Administered 2017-05-02: 2 g via INTRAVENOUS
  Filled 2017-05-02 (×2): qty 2

## 2017-05-02 MED ORDER — INSULIN ASPART PROT & ASPART (70-30 MIX) 100 UNIT/ML ~~LOC~~ SUSP
45.0000 [IU] | Freq: Two times a day (BID) | SUBCUTANEOUS | Status: DC
  Administered 2017-05-02: 45 [IU] via SUBCUTANEOUS

## 2017-05-02 MED ORDER — SODIUM CHLORIDE 0.9 % IV SOLN
INTRAVENOUS | Status: DC
  Administered 2017-05-02 (×2): via INTRAVENOUS

## 2017-05-02 NOTE — Progress Notes (Signed)
Inpatient Diabetes Program Recommendations  AACE/ADA: New Consensus Statement on Inpatient Glycemic Control (2015)  Target Ranges:  Prepandial:   less than 140 mg/dL      Peak postprandial:   less than 180 mg/dL (1-2 hours)      Critically ill patients:  140 - 180 mg/dL   Lab Results  Component Value Date   GLUCAP 279 (H) 05/02/2017   HGBA1C 8.8 (H) 05/01/2017    Review of Glycemic ControlResults for RODRICKUS, MIN (MRN [id number]) as of 05/02/2017 13:46  Ref. Range 05/01/2017 17:52 05/01/2017 21:11 05/02/2017 08:20 05/02/2017 11:56  Glucose-Capillary Latest Ref Range: 65 - 99 mg/dL 408 (H) 390 (H) 292 (H) 279 (H)   Diabetes history: Type 2 diabetes Outpatient Diabetes medications: Jardiance 25 mg daily, Novolog 70/30 mix 90 units bid, Metformin 500 mg daily Current orders for Inpatient glycemic control:  Novolog 70/30 mix 45 units bid  Inpatient Diabetes Program Recommendations:    Called MD to request Novolog correction-moderate tid with meals and HS.  She states that they will discuss and re-evaluate needs.    Thanks, Adah Perl, RN, BC-ADM Inpatient Diabetes Coordinator Pager [phone] (8a-5p)

## 2017-05-02 NOTE — Progress Notes (Signed)
  PHARMACY - PHYSICIAN COMMUNICATION CRITICAL VALUE ALERT - BLOOD CULTURE IDENTIFICATION (BCID)  Results for orders placed or performed during the hospital encounter of 05/01/17  Blood Culture ID Panel (Reflexed) (Collected: 05/01/2017  4:07 PM)  Result Value Ref Range   Enterococcus species NOT DETECTED NOT DETECTED   Listeria monocytogenes NOT DETECTED NOT DETECTED   Staphylococcus species NOT DETECTED NOT DETECTED   Staphylococcus aureus NOT DETECTED NOT DETECTED   Streptococcus species NOT DETECTED NOT DETECTED   Streptococcus agalactiae NOT DETECTED NOT DETECTED   Streptococcus pneumoniae NOT DETECTED NOT DETECTED   Streptococcus pyogenes NOT DETECTED NOT DETECTED   Acinetobacter baumannii NOT DETECTED NOT DETECTED   Enterobacteriaceae species DETECTED (A) NOT DETECTED   Enterobacter cloacae complex NOT DETECTED NOT DETECTED   Escherichia coli DETECTED (A) NOT DETECTED   Klebsiella oxytoca NOT DETECTED NOT DETECTED   Klebsiella pneumoniae NOT DETECTED NOT DETECTED   Proteus species NOT DETECTED NOT DETECTED   Serratia marcescens NOT DETECTED NOT DETECTED   Carbapenem resistance NOT DETECTED NOT DETECTED   Haemophilus influenzae NOT DETECTED NOT DETECTED   Neisseria meningitidis NOT DETECTED NOT DETECTED   Pseudomonas aeruginosa NOT DETECTED NOT DETECTED   Candida albicans NOT DETECTED NOT DETECTED   Candida glabrata NOT DETECTED NOT DETECTED   Candida krusei NOT DETECTED NOT DETECTED   Candida parapsilosis NOT DETECTED NOT DETECTED   Candida tropicalis NOT DETECTED NOT DETECTED    Name of physician (or Provider) Contacted: Dr. Aggie Hacker  Changes to prescribed antibiotics required: Rocephin 1g IV x 1 dose ordered and given in the [facility] on 10/7 however no current antibiotic orders - discussed with MD, will continue with Rocephin but increase the dose to 2g/24h for bacteremia.   Lawson Radar 05/02/2017  8:44 AM

## 2017-05-02 NOTE — Progress Notes (Signed)
RN notified Dr. Tarri Abernethy that patient does not have morning lactic acid level ordered and that patient currently has temp of 102.9 for which Acetaminophen 650 mg has been given.  RN also reminded Dr. Tarri Abernethy that patient has no IV fluids ordered at this time.  Dr. Tarri Abernethy states that he will add lactic acid level to morning labs and will review the chart and write orders. RN will continue to monitor patient and make MD aware of any changes.  P.J. Linus Mako, RN

## 2017-05-02 NOTE — Progress Notes (Signed)
2D Echocardiogram has been performed.  Maurice Stone 05/02/2017, 11:19 AM

## 2017-05-02 NOTE — Progress Notes (Signed)
   Subjective: Patient seen and examined. He states he is feeling better this morning. He denies fever or chills. He was on nasal canula, but denied shortness of breath. He has no acute complaints at this time. He is accompanied by his daughter.   Objective:  Vital signs in last 24 hours: Vitals:   05/01/17 2132 05/02/17 0506 05/02/17 0540 05/02/17 0632  BP: 127/61 112/63    Pulse: 73 (!) 106    Resp: 17 18    Temp: 99 F (37.2 C) (!) 102.9 F (39.4 C) (!) 102.1 F (38.9 C) 100.2 F (37.9 C)  TempSrc:   Oral Oral  SpO2: 99% 95%    Weight:      Height:       General: Laying in bed comfortably, NAD HEENT: [place]/AT, EOMI, no scleral icterus, PERRL Cardiac: RRR, No R/M/G appreciated Pulm: normal effort, CTAB Abd: soft, non tender, non distended, BS normal Ext: extremities well perfused, no peripheral edema Neuro: alert and oriented X3, cranial nerves II-XII grossly intact   Assessment/Plan:  Active Problems:   Hyperglycemia  Escherichia coli bacteremia  Urinary Tract Infection Patient with E. Coli bacteremia in setting of UTI. Patient became febrile overnight to Tmax of 102.9 with tachycardia and soft BPs at 112/63. Patient with mild leukocytosis of 12.4, lactic acid now trended down to 1.3. AO x3.  -Ceftriaxone 2 grams q24 hours -IVF 75 cc/hr  -CBC in AM  Hypergylcemia  Type 2 Diabetes  Hyperglycemia most likely secondary to insulin non-adherence for the past several days. CBG starting to normalize 565>>517>>408 >>390>>292.  Hemoglobin A1C 8.8. On exam has evidence of oral candidiasis.  -70/30 Novolog 40 units BID with meals -POC CBG monitoring QID -Continue 600 mg TID -Nystatin suspension TID  AKI Most likely pre-renal due to dehydration in setting of hyperglycemia. Creatinine elevated from baseline of 0.9 >> 1.35>>1.07 today, returning to patients baseline. -IVF @ 75 cc/hr -BMET in AM  Hemorrhoids  -Anusol, QID   Hypertension -Holding home regimen in setting  of soft Bps -Home regimen includes amlodipine 2.5 mg, losartan 25 mg, metoprolol 50 mg   HLD, Prior CVA -Lipitor 40 mg  -Continue Plavix 75 mg daily  Depression/Anxiety, Mild dementia Patient's anxiety and depression do not seem well controlled or are currently exacerbated by the patient's life stressors, which were difficult to understand.  -Zoloft 100 mg  -Continue Aricept 10 mg daily  GERD -Continue Protonix 40 mg daily  Dispo: Anticipated discharge in approximately 2-3 days.   Melanee Spry, MD 05/02/2017, 11:33 AM Pager: [phone]

## 2017-05-02 NOTE — Progress Notes (Signed)
Medicine attending: I examined this patient today together with resident physician Dr. Rochele Pages and I concur with her evaluation and management plan which we discussed together.  Please see separate attending admission note for complete details.  Patient is very calm alert and interactive this morning.  Etiology of his multiple somatic complaints found secondary to E. coli sepsis from urinary source.  We will continue antibiotics and gentle hydration.  Echocardiogram planned in view of bacteremia but it would be distinctly unusual for E. coli to seed the heart valves in a immunocompetent patient.

## 2017-05-03 ENCOUNTER — Encounter (HOSPITAL_COMMUNITY): Payer: Self-pay | Admitting: General Practice

## 2017-05-03 LAB — BASIC METABOLIC PANEL
ANION GAP: 9 (ref 5–15)
BUN: 10 mg/dL (ref 6–20)
CALCIUM: 7.8 mg/dL — AB (ref 8.9–10.3)
CO2: 24 mmol/L (ref 22–32)
CREATININE: 0.88 mg/dL (ref 0.61–1.24)
Chloride: 105 mmol/L (ref 101–111)
Glucose, Bld: 231 mg/dL — ABNORMAL HIGH (ref 65–99)
Potassium: 3.5 mmol/L (ref 3.5–5.1)
Sodium: 138 mmol/L (ref 135–145)

## 2017-05-03 LAB — CBC
HCT: 34.5 % — ABNORMAL LOW (ref 39.0–52.0)
Hemoglobin: 11.4 g/dL — ABNORMAL LOW (ref 13.0–17.0)
MCH: 29.7 pg (ref 26.0–34.0)
MCHC: 33 g/dL (ref 30.0–36.0)
MCV: 89.8 fL (ref 78.0–100.0)
PLATELETS: 194 10*3/uL (ref 150–400)
RBC: 3.84 MIL/uL — ABNORMAL LOW (ref 4.22–5.81)
RDW: 14.2 % (ref 11.5–15.5)
WBC: 11.8 10*3/uL — AB (ref 4.0–10.5)

## 2017-05-03 LAB — GLUCOSE, CAPILLARY
GLUCOSE-CAPILLARY: 213 mg/dL — AB (ref 65–99)
GLUCOSE-CAPILLARY: 214 mg/dL — AB (ref 65–99)
Glucose-Capillary: 168 mg/dL — ABNORMAL HIGH (ref 65–99)
Glucose-Capillary: 198 mg/dL — ABNORMAL HIGH (ref 65–99)
Glucose-Capillary: 101 mg/dL — ABNORMAL HIGH (ref 65–99)

## 2017-05-03 MED ORDER — DEXTROSE 5 % IV SOLN
2.0000 g | INTRAVENOUS | Status: DC
  Administered 2017-05-03 – 2017-05-04 (×2): 2 g via INTRAVENOUS
  Filled 2017-05-03 (×3): qty 2

## 2017-05-03 MED ORDER — ENSURE ENLIVE PO LIQD
237.0000 mL | Freq: Two times a day (BID) | ORAL | Status: DC
  Administered 2017-05-04 (×2): 237 mL via ORAL

## 2017-05-03 MED ORDER — DEXTROSE 5 % IV SOLN: 2.0000 g | INTRAVENOUS | Status: DC

## 2017-05-03 MED ORDER — INSULIN ASPART PROT & ASPART (70-30 MIX) 100 UNIT/ML ~~LOC~~ SUSP
50.0000 [IU] | Freq: Two times a day (BID) | SUBCUTANEOUS | Status: DC
  Administered 2017-05-03 – 2017-05-04 (×3): 50 [IU] via SUBCUTANEOUS
  Filled 2017-05-03: qty 10

## 2017-05-03 NOTE — Evaluation (Signed)
Physical Therapy Evaluation Patient Details Name: Maurice Stone MRN: [id number] DOB: 12/22/1940 Today's Date: 05/03/2017   History of Present Illness  76 year old man with hypertension, type 2 diabetes on multiple agents including insulin, associated neuropathy, cerebrovascular disease with history of stroke in 2002 in 2015, rheumatoid arthritis, who presents with an multitude of somatic complaints including dysuria and frequency. Pt currently being treated for UTI and sepsis.  Clinical Impression  Pt admitted with above diagnosis. Pt currently with functional limitations due to the deficits listed below (see PT Problem List). Pt mobility today limited due to incontinence of bowel. Ambulated with RW with min A within room, LOB to L with min A to correct and general unsteadiness noted.  Pt will benefit from skilled PT to increase their independence and safety with mobility to allow discharge to the venue listed below.       Follow Up Recommendations Home health PT    Equipment Recommendations  None recommended by PT    Recommendations for Other Services       Precautions / Restrictions Precautions Precautions: Fall Precaution Comments: pt denies falls at home Restrictions Weight Bearing Restrictions: No      Mobility  Bed Mobility Overal bed mobility: Needs Assistance Bed Mobility: Supine to Sit     Supine to sit: Supervision;Mod assist     General bed mobility comments: pt able to initiate getting to EOB but unable to scoot fwd to EOB due to hemorrhoidal pain, mod A with pad to help pt scoot to EOB  Transfers Overall transfer level: Needs assistance Equipment used: Rolling walker (2 wheeled) Transfers: Sit to/from Stand Sit to Stand: Min guard;Min assist         General transfer comment: min-guard from bed, min A from low toilet in bathroom with use of rails  Ambulation/Gait Ambulation/Gait assistance: Min assist Ambulation Distance (Feet): 15 Feet Assistive  device: Rolling walker (2 wheeled) Gait Pattern/deviations: Step-through pattern;Staggering left Gait velocity: decreased Gait velocity interpretation: <1.8 ft/sec, indicative of risk for recurrent falls General Gait Details: pt incontinent of bowels today so did not ambulate out of room. LOB to L with RW with min A to correct. Pt generally unsteady  Stairs            Wheelchair Mobility    Modified Rankin (Stroke Patients Only)       Balance Overall balance assessment: Needs assistance Sitting-balance support: No upper extremity supported Sitting balance-Leahy Scale: Good     Standing balance support: Single extremity supported Standing balance-Leahy Scale: Fair Standing balance comment: reliant on UE support for dynamic activity                             Pertinent Vitals/Pain Pain Assessment: Faces Faces Pain Scale: Hurts little more Pain Location: buttocks Pain Descriptors / Indicators: Aching Pain Intervention(s): Monitored during session    Home Living Family/patient expects to be discharged to:: Private residence Living Arrangements: Alone   Type of Home: Apartment Home Access: Level entry     Home Layout: One level Home Equipment: Environmental consultant - 4 wheels Additional Comments: pt lives at [employer] He walks to dining room and cares for self within his apt    Prior Function Level of Independence: Independent with assistive device(s)         Comments: uses rollator     Hand Dominance   Dominant Hand: Right    Extremity/Trunk Assessment   Upper Extremity Assessment Upper  Extremity Assessment: Defer to OT evaluation    Lower Extremity Assessment Lower Extremity Assessment: Overall WFL for tasks assessed    Cervical / Trunk Assessment Cervical / Trunk Assessment: Kyphotic  Communication   Communication: No difficulties  Cognition Arousal/Alertness: Awake/alert Behavior During Therapy: WFL for tasks  assessed/performed Overall Cognitive Status: Within Functional Limits for tasks assessed                                        General Comments      Exercises     Assessment/Plan    PT Assessment Patient needs continued PT services  PT Problem List Decreased activity tolerance;Decreased balance;Decreased mobility;Decreased knowledge of precautions;Pain       PT Treatment Interventions DME instruction;Gait training;Functional mobility training;Therapeutic activities;Therapeutic exercise;Balance training;Patient/family education    PT Goals (Current goals can be found in the Care Plan section)  Acute Rehab PT Goals Patient Stated Goal: return to his apt PT Goal Formulation: With patient Time For Goal Achievement: 05/17/17 Potential to Achieve Goals: Good    Frequency Min 3X/week   Barriers to discharge Decreased caregiver support no supervision within his apt    Co-evaluation               AM-PAC PT "6 Clicks" Daily Activity  Outcome Measure Difficulty turning over in bed (including adjusting bedclothes, sheets and blankets)?: A Little Difficulty moving from lying on back to sitting on the side of the bed? : A Little Difficulty sitting down on and standing up from a chair with arms (e.g., wheelchair, bedside commode, etc,.)?: A Little Help needed moving to and from a bed to chair (including a wheelchair)?: A Little Help needed walking in hospital room?: A Little Help needed climbing 3-5 steps with a railing? : A Little 6 Click Score: 18    End of Session Equipment Utilized During Treatment: Gait belt Activity Tolerance: Patient tolerated treatment well Patient left: in chair;with call bell/phone within reach Nurse Communication: Mobility status;Other (comment) (condom cath fell off when using toilet) PT Visit Diagnosis: Unsteadiness on feet (R26.81);Difficulty in walking, not elsewhere classified (R26.2)    Time: 4970-2637 PT Time Calculation  (min) (ACUTE ONLY): 32 min   Charges:   PT Evaluation $PT Eval Moderate Complexity: 1 Mod PT Treatments $Gait Training: 8-22 mins   PT G Codes:        Leighton Roach, PT  Acute Rehab Services  [place] 05/03/2017, 4:51 PM

## 2017-05-03 NOTE — Progress Notes (Signed)
Medicine attending: I examined this patient today together with resident physician Dr. Rochele Pages and I concur with her evaluation and management plan which we discussed together. He is doing well.  Maximum temperature 99.8.  Currently afebrile.  Day #2 ceftriaxone for E. coli septicemia with urinary tract source.  He is alert, oriented x3, pleasant and interactive with the rounding team.  Lungs are clear.  Regular cardiac rhythm.  Abdomen soft and nontender.  No edema.  No calf tenderness. Transthoracic echocardiogram normal.  No vegetations. Impression: E. coli sepsis-improving on current antibiotics Hyperglycemia- glucose coming under rapid control with control of infection and resuming insulin Plan: minimum 7-10 days of parenteral antibiotics.

## 2017-05-03 NOTE — Progress Notes (Signed)
   Subjective: Patient seen and examined. He states he is feeling better this morning. He has no acute complaints at this time. He denies fever, chills, or abdominal pain.   Objective:  Vital signs in last 24 hours: Vitals:   05/02/17 0632 05/02/17 1555 05/02/17 2058 05/03/17 0335  BP:  120/74 127/64 123/64  Pulse:  76 71 85  Resp:  16 17 18   Temp: 100.2 F (37.9 C) 98 F (36.7 C) 99.8 F (37.7 C) 98.6 F (37 C)  TempSrc: Oral Oral    SpO2:  100% 99% 94%  Weight:      Height:       General: Laying in bed comfortably eating breakfast, NAD HEENT: [place]/AT, EOMI, no scleral icterus Cardiac: RRR, No R/M/G appreciated Pulm: normal effort, CTAB Abd: soft, non tender, non distended, BS normal Ext: extremities well perfused, no peripheral edema Neuro: alert and oriented X3, cranial nerves II-XII grossly intact   Assessment/Plan:  Principal Problem:   E coli bacteremia Active Problems:   Hyperglycemia   Lower urinary tract infectious disease   Sepsis due to urinary tract infection (HCC)   Insulin dependent type 2 diabetes mellitus, uncontrolled (HCC)   Hemorrhoids   Diabetic mononeuropathy associated with type 2 diabetes mellitus (HCC)  Escherichia coli bacteremia  Urinary Tract Infection Patient with E. Coli bacteremia in setting of UTI. Patient afebrile over night, blood pressures improving with most recent 123/64. Mild leukocytosis trending down>>11.8 today. Clinically improving.  -Day 2 of Ceftriaxone 2 grams q24 hours, Day 3 of abx  -CBC in AM   Hypergylcemia  Type 2 Diabetes  Hyperglycemia most likely secondary to insulin non-adherence for the past several days. CBG starting to normalize 168 overnight, 213 this AM.  Hemoglobin A1C 8.8. Will continue to titrate up insulin regimen.  -70/30 Novolog 50 units BID with meals -POC CBG monitoring QID -Continue 600 mg TID -Nystatin suspension TID   AKI Resolved with hydration. Cr 0.88 today. -BMET in AM  Hemorrhoids    -Anusol, QID   Hypertension -Holding home regimen in setting of soft Bps and elevated Cr. Creatinine is back to baseline.  -Will continue to monitor -Home regimen includes amlodipine 2.5 mg, losartan 25 mg, metoprolol 50 mg   HLD, Prior CVA -Lipitor 40 mg  -Continue Plavix 75 mg daily  Depression/Anxiety, Mild dementia Mood is improved since admission. The patient's acute illness was most likely contributing to his depressed mood.  -Zoloft 100 mg  -Continue Aricept 10 mg daily  GERD -Continue Protonix 40 mg daily  Dispo: Anticipated discharge in approximately 2-3 days.   Melanee Spry, MD 05/03/2017, 10:22 AM Pager: [phone]

## 2017-05-03 NOTE — Evaluation (Signed)
Occupational Therapy Evaluation Patient Details Name: Maurice Stone MRN: [id number] DOB: [birth year] Today's Date: 05/03/2017    History of Present Illness 76 year old man with hypertension, type 2 diabetes on multiple agents including insulin, associated neuropathy, cerebrovascular disease with history of stroke in 2002 in 2015, rheumatoid arthritis, who presents with an multitude of somatic complaints including dysuria and frequency. Pt currently being treated for UTI and sepsis.   Clinical Impression   This 76 y/o M presents with the above. Pt lives alone, and at baseline is mod independent with ADLs and functional mobility using rollator. Pt currently requires MinGuard-MinA for functional mobility within room at RW level, requires MinA for LB ADLs. Pt will benefit from continued acute OT services and recommend additional [place] services to maximize Pt's safety and independence with ADLs and functional mobility.     Follow Up Recommendations  Home health OT    Equipment Recommendations  None recommended by OT           Precautions / Restrictions Precautions Precautions: Fall Precaution Comments: pt denies falls at home Restrictions Weight Bearing Restrictions: No      Mobility Bed Mobility Overal bed mobility: Needs Assistance Bed Mobility: Supine to Sit     Supine to sit: Supervision;Mod assist     General bed mobility comments: OOB in recliner   Transfers Overall transfer level: Needs assistance Equipment used: Rolling walker (2 wheeled) Transfers: Sit to/from Stand Sit to Stand: Min guard         General transfer comment: close guard for safety; verbal cues for hand placement     Balance Overall balance assessment: Needs assistance Sitting-balance support: No upper extremity supported Sitting balance-Leahy Scale: Good     Standing balance support: Single extremity supported;During functional activity Standing balance-Leahy Scale: Fair Standing balance  comment: able to maintain static standing during grooming ADLs at sink with close guard for safety                            ADL either performed or assessed with clinical judgement   ADL Overall ADL's : Needs assistance/impaired Eating/Feeding: Set up;Sitting   Grooming: Min guard;Standing;Oral care;Wash/dry face;Wash/dry hands   Upper Body Bathing: Min guard;Sitting   Lower Body Bathing: Sit to/from stand;Min guard   Upper Body Dressing : Min guard;Sitting   Lower Body Dressing: Minimal assistance;Sit to/from stand   Toilet Transfer: Ambulation;BSC;RW;Minimal assistance Toilet Transfer Details (indicate cue type and reason): BSC over toilet  Toileting- Clothing Manipulation and Hygiene: Moderate assistance;Sit to/from stand Toileting - Clothing Manipulation Details (indicate cue type and reason): assist for perihygiene after BM     Functional mobility during ADLs: Min guard;Rolling walker       Vision Baseline Vision/History: Wears glasses Wears Glasses: At all times                  Pertinent Vitals/Pain Pain Assessment: Faces Faces Pain Scale: Hurts little more Pain Location: buttocks Pain Descriptors / Indicators: Aching Pain Intervention(s): Monitored during session     Hand Dominance Right   Extremity/Trunk Assessment Upper Extremity Assessment Upper Extremity Assessment: Overall WFL for tasks assessed   Lower Extremity Assessment Lower Extremity Assessment: Defer to PT evaluation   Cervical / Trunk Assessment Cervical / Trunk Assessment: Kyphotic   Communication Communication Communication: No difficulties   Cognition Arousal/Alertness: Awake/alert Behavior During Therapy: WFL for tasks assessed/performed Overall Cognitive Status: Within Functional Limits for tasks assessed  Home Living Family/patient expects to be discharged to:: Private residence Living  Arrangements: Alone   Type of Home: Apartment Home Access: Level entry     [place]: One level     Bathroom Shower/Tub: Teacher, early years/pre: Standard Bathroom Accessibility: Yes   Home Equipment: Environmental consultant - 4 wheels;Tub bench;Bedside commode   Additional Comments: pt lives at [facility]. He walks to dining room and cares for self within his apt      Prior Functioning/Environment Level of Independence: Independent with assistive device(s)        Comments: uses rollator        OT Problem List: Decreased activity tolerance;Impaired balance (sitting and/or standing);Decreased knowledge of use of DME or AE      OT Treatment/Interventions: Self-care/ADL training;DME and/or AE instruction;Therapeutic activities;Balance training;Therapeutic exercise;Patient/family education;Energy conservation    OT Goals(Current goals can be found in the care plan section) Acute Rehab OT Goals Patient Stated Goal: return to his apt OT Goal Formulation: With patient Time For Goal Achievement: 05/17/17 Potential to Achieve Goals: Good  OT Frequency: Min 2X/week                             AM-PAC PT "6 Clicks" Daily Activity     Outcome Measure Help from another person eating meals?: None Help from another person taking care of personal grooming?: A Little Help from another person toileting, which includes using toliet, bedpan, or urinal?: A Little Help from another person bathing (including washing, rinsing, drying)?: A Little Help from another person to put on and taking off regular upper body clothing?: A Little Help from another person to put on and taking off regular lower body clothing?: A Little 6 Click Score: 19   End of Session Equipment Utilized During Treatment: Gait belt;Rolling walker Nurse Communication: Mobility status  Activity Tolerance: Patient tolerated treatment well Patient left: in chair;with call bell/phone within reach  OT Visit  Diagnosis: Unsteadiness on feet (R26.81);Muscle weakness (generalized) (M62.81)                Time: 2947-6546 OT Time Calculation (min): 42 min Charges:  OT General Charges $OT Visit: 1 Visit OT Evaluation $OT Eval Moderate Complexity: 1 Mod OT Treatments $Self Care/Home Management : 23-37 mins G-Codes:     Maurice Stone, OT Pager [phone] 05/03/2017   Raymondo Band 05/03/2017, 5:05 PM

## 2017-05-04 LAB — CBC
HCT: 35.4 % — ABNORMAL LOW (ref 39.0–52.0)
Hemoglobin: 11.5 g/dL — ABNORMAL LOW (ref 13.0–17.0)
MCH: 29 pg (ref 26.0–34.0)
MCHC: 32.5 g/dL (ref 30.0–36.0)
MCV: 89.2 fL (ref 78.0–100.0)
PLATELETS: 196 10*3/uL (ref 150–400)
RBC: 3.97 MIL/uL — ABNORMAL LOW (ref 4.22–5.81)
RDW: 14.1 % (ref 11.5–15.5)
WBC: 10.3 10*3/uL (ref 4.0–10.5)

## 2017-05-04 LAB — BASIC METABOLIC PANEL
Anion gap: 9 (ref 5–15)
BUN: 5 mg/dL — AB (ref 6–20)
CALCIUM: 8 mg/dL — AB (ref 8.9–10.3)
CO2: 22 mmol/L (ref 22–32)
CREATININE: 0.67 mg/dL (ref 0.61–1.24)
Chloride: 107 mmol/L (ref 101–111)
Glucose, Bld: 122 mg/dL — ABNORMAL HIGH (ref 65–99)
Potassium: 3.3 mmol/L — ABNORMAL LOW (ref 3.5–5.1)
SODIUM: 138 mmol/L (ref 135–145)

## 2017-05-04 LAB — CULTURE, BLOOD (ROUTINE X 2)
SPECIAL REQUESTS: ADEQUATE
Special Requests: ADEQUATE

## 2017-05-04 LAB — GLUCOSE, CAPILLARY
GLUCOSE-CAPILLARY: 180 mg/dL — AB (ref 65–99)
Glucose-Capillary: 171 mg/dL — ABNORMAL HIGH (ref 65–99)
Glucose-Capillary: 246 mg/dL — ABNORMAL HIGH (ref 65–99)
Glucose-Capillary: 207 mg/dL — ABNORMAL HIGH (ref 65–99)

## 2017-05-04 LAB — URINE CULTURE: Culture: >=100000 — AB

## 2017-05-04 MED ORDER — POTASSIUM CHLORIDE CRYS ER 20 MEQ PO TBCR
40.0000 meq | EXTENDED_RELEASE_TABLET | Freq: Once | ORAL | Status: AC
  Administered 2017-05-04: 40 meq via ORAL
  Filled 2017-05-04: qty 2

## 2017-05-04 MED ORDER — INSULIN ASPART PROT & ASPART (70-30 MIX) 100 UNIT/ML ~~LOC~~ SUSP
55.0000 [IU] | Freq: Two times a day (BID) | SUBCUTANEOUS | Status: DC
  Administered 2017-05-04: 55 [IU] via SUBCUTANEOUS
  Filled 2017-05-04: qty 10

## 2017-05-04 NOTE — Progress Notes (Signed)
Occupational Therapy Treatment Patient Details Name: Maurice Stone MRN: [id number] DOB: 1940/11/17 Today's Date: 05/04/2017    History of present illness 76 year old man with hypertension, type 2 diabetes on multiple agents including insulin, associated neuropathy, cerebrovascular disease with history of stroke in 2002 in 2015, rheumatoid arthritis, who presents with an multitude of somatic complaints including dysuria and frequency. Pt currently being treated for UTI and sepsis.   OT comments  Pt making good progress towards goals, completed functional mobility within room, toileting, and standing grooming ADLs with minguard assist throughout, requiring increased time and min verbal safety cues. POC remains appropriate; Will continue to follow acutely to maximize Pt's safety and independence with ADLs and functional mobility.    Follow Up Recommendations  Home health OT    Equipment Recommendations  None recommended by OT          Precautions / Restrictions Precautions Precautions: Fall Restrictions Weight Bearing Restrictions: No       Mobility Bed Mobility Overal bed mobility: Needs Assistance Bed Mobility: Supine to Sit     Supine to sit: Supervision     General bed mobility comments: supervision for safety   Transfers Overall transfer level: Needs assistance Equipment used: Rolling walker (2 wheeled) Transfers: Sit to/from Stand Sit to Stand: Min guard         General transfer comment: close guard for safety    Balance Overall balance assessment: Needs assistance Sitting-balance support: No upper extremity supported Sitting balance-Leahy Scale: Good     Standing balance support: Single extremity supported;During functional activity Standing balance-Leahy Scale: Fair Standing balance comment: able to maintain static standing during grooming ADLs at sink with close guard for safety                            ADL either performed or  assessed with clinical judgement   ADL Overall ADL's : Needs assistance/impaired     Grooming: Min guard;Standing;Oral care;Wash/dry face;Wash/dry hands               Lower Body Dressing: Min guard;Sit to/from stand Lower Body Dressing Details (indicate cue type and reason): donning shoes seated EOB  Toilet Transfer: Ambulation;BSC;RW;Min guard Toilet Transfer Details (indicate cue type and reason): BSC over toilet  Toileting- Clothing Manipulation and Hygiene: Min guard;Sit to/from stand       Functional mobility during ADLs: Min guard;Rolling walker                         Cognition Arousal/Alertness: Awake/alert Behavior During Therapy: WFL for tasks assessed/performed Overall Cognitive Status: Within Functional Limits for tasks assessed                                                            Pertinent Vitals/ Pain       Pain Assessment: No/denies pain                                                          Frequency  Min 2X/week  Progress Toward Goals  OT Goals(current goals can now be found in the care plan section)  Progress towards OT goals: Progressing toward goals  Acute Rehab OT Goals Patient Stated Goal: return to his apt OT Goal Formulation: With patient Time For Goal Achievement: 05/17/17 Potential to Achieve Goals: Good  Plan Discharge plan remains appropriate                     AM-PAC PT "6 Clicks" Daily Activity     Outcome Measure   Help from another person eating meals?: None Help from another person taking care of personal grooming?: A Little Help from another person toileting, which includes using toliet, bedpan, or urinal?: A Little Help from another person bathing (including washing, rinsing, drying)?: A Little Help from another person to put on and taking off regular upper body clothing?: A Little Help from another person to put on and taking off  regular lower body clothing?: A Little 6 Click Score: 19    End of Session Equipment Utilized During Treatment: Gait belt;Rolling walker  OT Visit Diagnosis: Unsteadiness on feet (R26.81);Muscle weakness (generalized) (M62.81)   Activity Tolerance Patient tolerated treatment well   Patient Left in chair;with call bell/phone within reach;with chair alarm set   Nurse Communication Mobility status        Time: 1610-9604 OT Time Calculation (min): 40 min  Charges: OT General Charges $OT Visit: 1 Visit OT Treatments $Self Care/Home Management : 23-37 mins  Lou Cal, OT Pager [phone] 05/04/2017    Maurice Stone 05/04/2017, 5:22 PM

## 2017-05-04 NOTE — Progress Notes (Signed)
   Subjective: Patient seen and examined. He states he feels well this morning. He has no acute complaints at this time. Hemorrhoid discomfort has improved. He denies fever, chills, or abdominal pain.   Objective:  Vital signs in last 24 hours: Vitals:   05/03/17 0335 05/03/17 2059 05/04/17 0530 05/04/17 0537  BP: 123/64 128/73  132/72  Pulse: 85 73  85  Resp: 18 18  18   Temp: 98.6 F (37 C) 98.5 F (36.9 C) 98.3 F (36.8 C)   TempSrc:  Oral Oral   SpO2: 94% 97%  94%  Weight:      Height:       General: Sitting on side of the bed comfortably, NAD HEENT: [place]/AT, EOMI, no scleral icterus Cardiac: RRR, No R/M/G appreciated Pulm: normal effort, CTAB Abd: soft, non tender, non distended, BS normal Ext: extremities well perfused, no peripheral edema Neuro: alert and oriented X3, cranial nerves II-XII grossly intact   Assessment/Plan:  Principal Problem:   E coli bacteremia Active Problems:   Hyperglycemia   Lower urinary tract infectious disease   Sepsis due to urinary tract infection (HCC)   Insulin dependent type 2 diabetes mellitus, uncontrolled (HCC)   Hemorrhoids   Diabetic mononeuropathy associated with type 2 diabetes mellitus ([place])  Escherichia coli bacteremia  Patient with E. Coli bacteremia in setting of UTI. Patient afebrile with stable vital signs. Mild leukocytosis resolved today>>10.3 today. Responding well to abx treatment.  -Day 3 of Ceftriaxone 2 grams q24 hours, 4 days total of abx   Hypergylcemia  Type 2 Diabetes  Hyperglycemia most likely secondary to insulin non-adherence. CBG normalizing 180 this AM.  Hemoglobin A1C 8.8. Will continue to titrate up insulin regimen.  -70/30 Novolog 50 units BID with meals -POC CBG monitoring QID -Continue 600 mg TID -Nystatin suspension TID   Hemorrhoids  Patients discomfort improved. -Anusol, QID   Hypertension Normotensive, BP ranging from 546-270 systolic and 35-00 diastolic -Will continue to  monitor -Continue to hold home regimen in setting of normotension: amlodipine 2.5 mg, losartan 25 mg, metoprolol 50 mg   HLD, Prior CVA -Lipitor 40 mg  -Continue Plavix 75 mg daily  Depression/Anxiety, Mild dementia Per patient's daughter he was not on Zoloft prior to admission. She states that he had been taking Tramadol as an outpatient, which made him very lethargic and not like himself. Overall, the patient's mood has improved. He is not receiving tramadol during this hospitalization and his daughter has noted a significant difference in his demeanor and energy level. Pt is tolerating Zoloft well.  -Zoloft 100 mg  -Continue Aricept 10 mg daily  GERD -Continue Protonix 40 mg daily  Dispo: Anticipated discharge in approximately 2-3 days.   Melanee Spry, MD 05/04/2017, 10:16 AM Pager: [phone]

## 2017-05-04 NOTE — Progress Notes (Signed)
Medicine attending: I examined this patient today together with resident physician Dr. Rochele Pages and I concur with her evaluation and management plan which we discussed together. He continues to do well.  Day 4 antibiotics for E. coli sepsis, urinary tract source.  Initial uncontrolled diabetes now controlled.  Continue full 7-day course of parenteral antibiotics.  Additional week of oral antibiotics.

## 2017-05-04 NOTE — Progress Notes (Signed)
Nutrition Brief Note  Patient identified on the Malnutrition Screening Tool (MST) Report  Wt Readings from Last 15 Encounters:  05/01/17 185 lb (83.9 kg)  12/10/16 181 lb (82.1 kg)  03/31/15 193 lb (87.5 kg)  02/02/15 195 lb (88.5 kg)  03/28/14 207 lb (93.9 kg)  01/30/14 216 lb 0.8 oz (98 kg)  01/17/14 197 lb 8 oz (42.48 kg)   76 year old man with hypertension, type 2 diabetes on multiple agents including insulin, associated neuropathy, cerebrovascular disease with history of stroke in 2002 in 2015, rheumatoid arthritis, who presents with an multitude of somatic complaints including dysuria and frequency.   Pt admitted with [place] with sepsis.   Pt very somnolent at time of visit. Did not respond to name being called.   Wt hx reviewed. Wt has been stable over the past year.   Nutrition-Focused physical exam completed. Findings are no fat depletion, no muscle depletion, and no edema.   Last Hgb A1c: 8.8 (home DM medications are 25 mg empagliflozin daily, 90 units insulin aspart-protamine-aspart BID, 1.8 mg victoza daily, 500 mg metformin BID). Labs reviewed: K: 3.3, CBGS: 171-180 (current orders for glycemic control are 0-15 units insulin aspart TID and 55 units  insulin aspart protamine-aspart BID).   Body mass index is 25.8 kg/m. Patient meets criteria for overweight based on current BMI.   Current diet order is Carb Modified, patient is consuming approximately 65-85% of meals at this time. Labs and medications reviewed.   No nutrition interventions warranted at this time. If nutrition issues arise, please consult RD.   Brannon Levene A. Jimmye Norman, RD, LDN, CDE Pager: [phone] After hours Pager: [phone]

## 2017-05-05 LAB — BASIC METABOLIC PANEL
Anion gap: 8 (ref 5–15)
BUN: 7 mg/dL (ref 6–20)
CHLORIDE: 108 mmol/L (ref 101–111)
CO2: 23 mmol/L (ref 22–32)
CREATININE: 0.74 mg/dL (ref 0.61–1.24)
Calcium: 8.4 mg/dL — ABNORMAL LOW (ref 8.9–10.3)
GFR calc Af Amer: >60 mL/min (ref >60)
GFR calc non Af Amer: >60 mL/min (ref >60)
Glucose, Bld: 75 mg/dL (ref 65–99)
Potassium: 3.6 mmol/L (ref 3.5–5.1)
SODIUM: 139 mmol/L (ref 135–145)

## 2017-05-05 LAB — CBC
HCT: 33.4 % — ABNORMAL LOW (ref 39.0–52.0)
Hemoglobin: 11.1 g/dL — ABNORMAL LOW (ref 13.0–17.0)
MCH: 29.8 pg (ref 26.0–34.0)
MCHC: 33.2 g/dL (ref 30.0–36.0)
MCV: 89.8 fL (ref 78.0–100.0)
PLATELETS: 189 10*3/uL (ref 150–400)
RBC: 3.72 MIL/uL — ABNORMAL LOW (ref 4.22–5.81)
RDW: 14.4 % (ref 11.5–15.5)
WBC: 10.1 10*3/uL (ref 4.0–10.5)

## 2017-05-05 LAB — GLUCOSE, CAPILLARY
Glucose-Capillary: 117 mg/dL — ABNORMAL HIGH (ref 65–99)
Glucose-Capillary: 179 mg/dL — ABNORMAL HIGH (ref 65–99)
Glucose-Capillary: 237 mg/dL — ABNORMAL HIGH (ref 65–99)
Glucose-Capillary: 114 mg/dL — ABNORMAL HIGH (ref 65–99)

## 2017-05-05 MED ORDER — SALINE SPRAY 0.65 % NA SOLN
1.0000 | Freq: Every day | NASAL | Status: DC
  Administered 2017-05-05 – 2017-05-08 (×4): 1 via NASAL
  Filled 2017-05-05: qty 44

## 2017-05-05 MED ORDER — INSULIN ASPART PROT & ASPART (70-30 MIX) 100 UNIT/ML ~~LOC~~ SUSP
60.0000 [IU] | Freq: Two times a day (BID) | SUBCUTANEOUS | Status: DC
  Administered 2017-05-05 – 2017-05-08 (×6): 60 [IU] via SUBCUTANEOUS
  Filled 2017-05-05: qty 10

## 2017-05-05 MED ORDER — AMLODIPINE BESYLATE 2.5 MG PO TABS
2.5000 mg | ORAL_TABLET | Freq: Every day | ORAL | Status: DC
  Administered 2017-05-05 – 2017-05-08 (×4): 2.5 mg via ORAL
  Filled 2017-05-05 (×4): qty 1

## 2017-05-05 MED ORDER — DEXTROSE 5 % IV SOLN
2.0000 g | INTRAVENOUS | Status: DC
  Administered 2017-05-06: 2 g via INTRAVENOUS
  Filled 2017-05-05: qty 2

## 2017-05-05 NOTE — Progress Notes (Signed)
   Subjective: Patient seen and examined. He states he feels well this morning, but states he has to intermittently catch his breath when lying in bed. He has no acute complaints at this time. He denies fever, chills, or abdominal pain.   Objective:  Vital signs in last 24 hours: Vitals:   05/04/17 1438 05/04/17 2155 05/05/17 0128 05/05/17 0948  BP: (!) 122/59 122/77 (!) 149/74 (!) 106/55  Pulse: 72 89 69   Resp: 16 19 18    Temp: 98.3 F (36.8 C) 98 F (36.7 C) 97.9 F (36.6 C)   TempSrc: Oral  Oral   SpO2: 94% 99% 97%   Weight:      Height:       General: Laying in bed comfortably, NAD HEENT: [place]/AT, EOMI, no scleral icterus Cardiac: RRR, No R/M/G appreciated Pulm: normal effort, bibasilar crackles, good aeration throughout lung fields bilaterally Abd: soft, non tender, non distended, BS normal Ext: extremities well perfused, no peripheral edema Neuro: alert and oriented X3, cranial nerves II-XII grossly intact   Assessment/Plan:  Principal Problem:   E coli bacteremia Active Problems:   Hyperglycemia   Lower urinary tract infectious disease   Sepsis due to urinary tract infection (HCC)   Insulin dependent type 2 diabetes mellitus, uncontrolled (HCC)   Hemorrhoids   Diabetic mononeuropathy associated with type 2 diabetes mellitus ([place])  Escherichia coli bacteremia  Patient with E. Coli bacteremia in setting of UTI. Patient afebrile with stable vital signs. Mild leukocytosis resolved >>10.1 today. Responding well to abx treatment.  -Day 4 of Ceftriaxone 2 grams q24 hours, 5 days total of abx >> will treat with IV ceftriaxone for a total of 7 days -Repeat blood cultures pending  SOB Patient with normal heart rate, sating well on RA. Bibasilar crackles on exam consistent with atelectasis. -Incentive spirometry  -PT/OT -OOB into chair -Ambulate patient   Hypergylcemia  Type 2 Diabetes  Hyperglycemia most likely secondary to insulin non-adherence. CBG ranging from  100s in the AM to 200s in the PM.  Hemoglobin A1C 8.8. Will continue to titrate up insulin regimen.  -70/30 Novolog 60 units BID with meals -Moderate coverage SSI -POC CBG monitoring QID -Continue 600 mg TID -Nystatin suspension TID   Hemorrhoids  Patients discomfort improved. -Anusol, QID   Hypertension BP elevated this AM 149/74.  -Will continue to monitor -Restarted amlodipine 2.5 mg daily -Continue to hold losartan 25 mg andmetoprolol 50 mg   HLD, Prior CVA -Lipitor 40 mg  -Continue Plavix 75 mg daily  Depression/Anxiety, Mild dementia Per patient's daughter he was not on Zoloft prior to admission. She states that he had been taking Tramadol as an outpatient, which made him very lethargic and not like himself. Overall, the patient's mood has improved. He is not receiving tramadol during this hospitalization and his daughter has noted a significant difference in his demeanor and energy level. Pt is tolerating Zoloft well.  -Zoloft 100 mg  -Continue Aricept 10 mg daily  GERD -Continue Protonix 40 mg daily  Dispo: Anticipated discharge in approximately 2-3 days.   Melanee Spry, MD 05/05/2017, 10:47 AM Pager: [phone]

## 2017-05-05 NOTE — Progress Notes (Signed)
Medicine attending: I examined this patient today together with resident physician Dr. Rochele Pages and I concur with her evaluation and management plan which we discussed together. Clinically stable.  Lungs clear.  Regular cardiac rhythm.  Afebrile day 5 parenteral antibiotics for E. coli septicemia, urinary tract source.  Negative TTE for signs of endocarditis.  Plan 7 days parenteral antibiotics and then additional 7 days p.o. as outpatient. Lab profile today stable.  White count 13,000 on admission, has plateaued at 10,000 over the last 48 hours. Glucose under better control. Our pharmacist brought up the possibility that his gliptin hypoglycemic drug may have predisposed towards the urinary tract infection.

## 2017-05-05 NOTE — Care Management Note (Addendum)
Case Management Note  Patient Details  Name: Maurice Stone MRN: [id number] Date of Birth: January 28, 1941  Subjective/Objective:                 Admitted with E.coli bacteremia.Resides @ [phone].  PCP: Dr. Luetta Nutting, [place] [address]/ [place] [phone]  Action/Plan: Return to home when medically stable. CM to f/u with disposition needs.  Expected Discharge Date:                  Expected Discharge Plan:  [place]  In-House Referral:     Discharge planning Services  CM Consult  Post Acute Care Choice:    Choice offered to:  Patient  DME Arranged:    DME Agency:     HH Arranged:  PT, OT, Nurse [place] Agency:  [place], referral made pending MD's order. CM has requested orders from MD. CM will need to fax home health orders to [phone], ATTN: Dr.Cody Zigmund Daniel.  Status of Service:  completed  If discussed at Long Length of Stay Meetings, dates discussed:    Additional Comments:  Sharin Mons, RN 05/05/2017, 3:27 PM

## 2017-05-05 NOTE — Progress Notes (Signed)
Physical Therapy Treatment Patient Details Name: Maurice Stone MRN: [id number] DOB: Sep 07, 1940 Today's Date: 05/05/2017    History of Present Illness 76 year old man with hypertension, type 2 diabetes on multiple agents including insulin, associated neuropathy, cerebrovascular disease with history of stroke in 2002 in 2015, rheumatoid arthritis, who presents with an multitude of somatic complaints including dysuria and frequency. Pt currently being treated for UTI and sepsis.    PT Comments    Pt is making good progress towards his goals today. Pt is currently, supervision for transfers and ambulation of 80 feet with RW. Pt requires skilled PT to progress mobility and improve strength and endurance to safely navigate their discharge environment.    Follow Up Recommendations  Home health PT     Equipment Recommendations  None recommended by PT    Recommendations for Other Services       Precautions / Restrictions Precautions Precautions: Fall Precaution Comments: pt denies falls at home Restrictions Weight Bearing Restrictions: No    Mobility  Bed Mobility               General bed mobility comments: OOB in recliner on entry  Transfers Overall transfer level: Needs assistance   Transfers: Sit to/from Stand Sit to Stand: Supervision         General transfer comment: supervision for safety from recliner, good power up and steadying before walking over to RW  Ambulation/Gait Ambulation/Gait assistance: Supervision Ambulation Distance (Feet): 80 Feet Assistive device: Rolling walker (2 wheeled) Gait Pattern/deviations: Step-through pattern;Decreased step length - right;Decreased step length - left Gait velocity: decreased Gait velocity interpretation: Below normal speed for age/gender General Gait Details: supervision for safety, slow, steady cadence, vc for looking up and out        Balance Overall balance assessment: Needs assistance Sitting-balance  support: No upper extremity supported Sitting balance-Leahy Scale: Good     Standing balance support: Single extremity supported Standing balance-Leahy Scale: Good Standing balance comment: able to stand at sink, brush teeth and wash face without assist                            Cognition Arousal/Alertness: Awake/alert Behavior During Therapy: WFL for tasks assessed/performed Overall Cognitive Status: Within Functional Limits for tasks assessed                                           General Comments General comments (skin integrity, edema, etc.): VSS throughout session       Pertinent Vitals/Pain Pain Assessment: No/denies pain     PT Goals (current goals can now be found in the care plan section) Acute Rehab PT Goals Patient Stated Goal: return to his apt PT Goal Formulation: With patient Time For Goal Achievement: 05/17/17 Potential to Achieve Goals: Good Progress towards PT goals: Progressing toward goals    Frequency    Min 3X/week      PT Plan Current plan remains appropriate       AM-PAC PT "6 Clicks" Daily Activity  Outcome Measure  Difficulty turning over in bed (including adjusting bedclothes, sheets and blankets)?: A Little Difficulty moving from lying on back to sitting on the side of the bed? : A Little Difficulty sitting down on and standing up from a chair with arms (e.g., wheelchair, bedside commode, etc,.)?: A Little Help needed moving to and  from a bed to chair (including a wheelchair)?: A Little Help needed walking in hospital room?: A Little Help needed climbing 3-5 steps with a railing? : A Little 6 Click Score: 18    End of Session Equipment Utilized During Treatment: Gait belt Activity Tolerance: Patient tolerated treatment well Patient left: in chair;with call bell/phone within reach Nurse Communication: Mobility status;Other (comment) (condom cath fell off when using toilet) PT Visit Diagnosis:  Unsteadiness on feet (R26.81);Difficulty in walking, not elsewhere classified (R26.2)     Time: 1740-1800 PT Time Calculation (min) (ACUTE ONLY): 20 min  Charges:  $Gait Training: 8-22 mins                    G Codes:       Aynsley Fleet B. Migdalia Dk PT, DPT Acute Rehabilitation  [phone] Pager [phone]     [place] 05/05/2017, 6:13 PM

## 2017-05-06 LAB — CBC
HEMATOCRIT: 36 % — AB (ref 39.0–52.0)
HEMOGLOBIN: 11.7 g/dL — AB (ref 13.0–17.0)
MCH: 29.3 pg (ref 26.0–34.0)
MCHC: 32.5 g/dL (ref 30.0–36.0)
MCV: 90.2 fL (ref 78.0–100.0)
Platelets: 219 10*3/uL (ref 150–400)
RBC: 3.99 MIL/uL — AB (ref 4.22–5.81)
RDW: 14.4 % (ref 11.5–15.5)
WBC: 9.8 10*3/uL (ref 4.0–10.5)

## 2017-05-06 LAB — BASIC METABOLIC PANEL
Anion gap: 8 (ref 5–15)
BUN: 7 mg/dL (ref 6–20)
CHLORIDE: 105 mmol/L (ref 101–111)
CO2: 26 mmol/L (ref 22–32)
Calcium: 8.8 mg/dL — ABNORMAL LOW (ref 8.9–10.3)
Creatinine, Ser: 0.75 mg/dL (ref 0.61–1.24)
GFR calc Af Amer: >60 mL/min (ref >60)
GFR calc non Af Amer: >60 mL/min (ref >60)
GLUCOSE: 96 mg/dL (ref 65–99)
POTASSIUM: 3.6 mmol/L (ref 3.5–5.1)
Sodium: 139 mmol/L (ref 135–145)

## 2017-05-06 LAB — GLUCOSE, CAPILLARY
GLUCOSE-CAPILLARY: 246 mg/dL — AB (ref 65–99)
Glucose-Capillary: 110 mg/dL — ABNORMAL HIGH (ref 65–99)
Glucose-Capillary: 260 mg/dL — ABNORMAL HIGH (ref 65–99)
Glucose-Capillary: 263 mg/dL — ABNORMAL HIGH (ref 65–99)

## 2017-05-06 MED ORDER — CEFTRIAXONE SODIUM 1 G IJ SOLR
2.0000 g | Freq: Once | INTRAMUSCULAR | Status: DC
  Filled 2017-05-06: qty 20

## 2017-05-06 MED ORDER — DEXTROSE 5 % IV SOLN
2.0000 g | INTRAVENOUS | Status: DC
  Administered 2017-05-06: 2 g via INTRAVENOUS
  Filled 2017-05-06 (×2): qty 2

## 2017-05-06 NOTE — Progress Notes (Signed)
Medicine attending: I examined this patient today together with resident physician Dr. Rochele Pages and I concur with her evaluation and management plan which we discussed together. He continues to do well.  Complaining of some dyspnea with minimal exertion.  Lungs are clear.  No JVD.  Regular cardiac rhythm no murmur.  No calf tenderness or swelling.  Oxygenating well on room air. He remains afebrile on ceftriaxone day 5 of 7.  Survey blood cultures negative so far.  Blood sugars under good control.  Anticipate discharge on Sunday when he complete 7 days of IV and then give additional 5-7 days p.o. antibiotics. Impression: 1.  E. coli septicemia, urinary source 2.  Initial uncontrolled diabetes related to acute infection

## 2017-05-06 NOTE — Progress Notes (Signed)
   Subjective: Patient seen and examined. He states he feels well this morning, but states he is having shortness of breath with minimal activity. He has been getting OOB into chair and ambulating. Incentive spirometry was ordered, but he did not receive yet. He has no acute complaints at this time. He denies fever, chills, or abdominal pain, or diarrhea.   Objective:  Vital signs in last 24 hours: Vitals:   05/05/17 0948 05/05/17 1531 05/05/17 2142 05/06/17 0519  BP: (!) 106/55 117/65 138/73 123/61  Pulse:  82 68 70  Resp:  17 17 18   Temp:  (!) 97.5 F (36.4 C) 98.3 F (36.8 C) 99.8 F (37.7 C)  TempSrc:  Oral Oral Oral  SpO2:  99% 96% 98%  Weight:      Height:       General: Laying in bed comfortably, NAD HEENT: [place]/AT, EOMI, no scleral icterus, no JVD Cardiac: RRR, No R/M/G appreciated Pulm: normal effort, bibasilar crackles, good aeration throughout lung fields bilaterally Abd: soft, non tender, non distended, BS normal Ext: extremities well perfused, no peripheral edema Neuro: alert and oriented X3, cranial nerves II-XII grossly intact   Assessment/Plan:  Principal Problem:   E coli bacteremia Active Problems:   Hyperglycemia   Lower urinary tract infectious disease   Sepsis due to urinary tract infection (HCC)   Insulin dependent type 2 diabetes mellitus, uncontrolled (HCC)   Hemorrhoids   Diabetic mononeuropathy associated with type 2 diabetes mellitus ([place])  Escherichia coli bacteremia  Patient with E. Coli bacteremia in setting of UTI. Patient afebrile with stable vital signs. Mild leukocytosis resolved.  Responding well to abx treatment.  -Day 5 of Ceftriaxone 2 grams q24 hours, 6 days total of abx >> will treat with IV ceftriaxone for a total of 7 days -Repeat Blood cultures NGTD   SOB Patient with normal heart rate, sating well on RA. Bibasilar crackles on exam consistent with atelectasis and being in bed. -Incentive spirometry>>will contact respiratory  therapy -PT/OT -OOB into chair -Ambulate patient   Hypergylcemia  Type 2 Diabetes  Hyperglycemia most likely secondary to insulin non-adherence. CBG ranging from 100s in the AM to 200s in the PM.  Hemoglobin A1C 8.8. Holding titration today, BG 110 this AM, held Novolog this morning. Will monitor BG and most likely give as he ate all of his breakfast this AM. -70/30 Novolog 60 units BID with meals -Moderate coverage SSI -POC CBG monitoring QID -Continue 600 mg TID -Nystatin suspension TID   Hemorrhoids  Patients discomfort improved. -Anusol, QID   Hypertension Normotensive this AM 123/61  -Will continue to monitor -Restarted amlodipine 2.5 mg daily -Continue to hold losartan 25 mg and metoprolol 50 mg   HLD, Prior CVA -Lipitor 40 mg  -Continue Plavix 75 mg daily  Depression/Anxiety, Mild dementia -Zoloft 100 mg  -Continue Aricept 10 mg daily  GERD -Continue Protonix 40 mg daily  Dispo: Anticipated discharge in approximately 2-3 days.   Melanee Spry, MD 05/06/2017, 11:15 AM Pager: [phone]

## 2017-05-06 NOTE — Care Management Important Message (Signed)
Important Message  Patient Details  Name: Maurice Stone MRN: [id number] Date of Birth: 01-02-1941   Medicare Important Message Given:  Yes    [name] Abena 05/06/2017, 11:30 AM

## 2017-05-06 NOTE — Progress Notes (Addendum)
CM faxed home health orders to [place] @ [phone], ATTN: Dr. Zigmund Daniel, pt 's PCP. Whitman Hero RN,BSN,CM

## 2017-05-07 DIAGNOSIS — A4151 Sepsis due to Escherichia coli [E. coli]: Principal | ICD-10-CM

## 2017-05-07 DIAGNOSIS — E119 Type 2 diabetes mellitus without complications: Secondary | ICD-10-CM

## 2017-05-07 LAB — CBC
HCT: 35.8 % — ABNORMAL LOW (ref 39.0–52.0)
HEMOGLOBIN: 11.6 g/dL — AB (ref 13.0–17.0)
MCH: 29.1 pg (ref 26.0–34.0)
MCHC: 32.4 g/dL (ref 30.0–36.0)
MCV: 89.9 fL (ref 78.0–100.0)
Platelets: 233 10*3/uL (ref 150–400)
RBC: 3.98 MIL/uL — AB (ref 4.22–5.81)
RDW: 14.8 % (ref 11.5–15.5)
WBC: 9.8 10*3/uL (ref 4.0–10.5)

## 2017-05-07 LAB — BASIC METABOLIC PANEL
ANION GAP: 6 (ref 5–15)
BUN: 8 mg/dL (ref 6–20)
CALCIUM: 8.6 mg/dL — AB (ref 8.9–10.3)
CO2: 25 mmol/L (ref 22–32)
Chloride: 104 mmol/L (ref 101–111)
Creatinine, Ser: 0.73 mg/dL (ref 0.61–1.24)
GLUCOSE: 233 mg/dL — AB (ref 65–99)
Potassium: 4 mmol/L (ref 3.5–5.1)
Sodium: 135 mmol/L (ref 135–145)

## 2017-05-07 LAB — GLUCOSE, CAPILLARY
GLUCOSE-CAPILLARY: 278 mg/dL — AB (ref 65–99)
Glucose-Capillary: 220 mg/dL — ABNORMAL HIGH (ref 65–99)
Glucose-Capillary: 236 mg/dL — ABNORMAL HIGH (ref 65–99)
Glucose-Capillary: 262 mg/dL — ABNORMAL HIGH (ref 65–99)

## 2017-05-07 MED ORDER — CEFAZOLIN SODIUM-DEXTROSE 1-4 GM/50ML-% IV SOLN
1.0000 g | Freq: Two times a day (BID) | INTRAVENOUS | Status: DC
  Administered 2017-05-07 – 2017-05-08 (×3): 1 g via INTRAVENOUS
  Filled 2017-05-07 (×5): qty 50

## 2017-05-07 NOTE — Progress Notes (Signed)
   Subjective: Patient states he feels well today; he denies shortness of breath, chest pain, fevers, chills, nausea, vomiting. He has been eating well. He has been using his incentive spirometer (demonstrated incorrect use).  Objective:  Vital signs in last 24 hours: Vitals:   05/06/17 2240 05/07/17 0512 05/07/17 0923 05/07/17 1419  BP: 124/61 124/82 140/86 109/68  Pulse: 65 71  76  Resp: 18 18  17   Temp:  98.7 F (37.1 C)  98.7 F (37.1 C)  TempSrc:  Oral    SpO2: 97% 96%  97%  Weight:  186 lb (84.4 kg)    Height:       General: Sitting up in bed eating breakfast Cardiac: RRR, No R/M/G appreciated Pulm: normal effort, good aeration throughout lung fields bilaterally, no rales or wheezes appreciated Abd: soft, non tender, non distended, BS normal Ext: extremities well perfused, no peripheral edema Neuro: alert and oriented X3, cranial nerves II-XII grossly intact  Assessment/Plan:  Principal Problem:   E coli bacteremia Active Problems:   Hyperglycemia   Lower urinary tract infectious disease   Sepsis due to urinary tract infection (HCC)   Insulin dependent type 2 diabetes mellitus, uncontrolled (HCC)   Hemorrhoids   Diabetic mononeuropathy associated with type 2 diabetes mellitus (HCC)  Escherichia coli bacteremia, UTI Patient with E. Coli bacteremia in setting of UTI. Patient afebrile with stable vital signs. Responding well to abx treatment.  -received 5 days of ceftriaxone and responding well; will narrow today to cefazolin -will get 7 full days of IV Abx, so possible discharge tomorrow if stable with narrowing of Abx -Repeat Blood cultures NGTD x2 days  SOB Resolved, thought to be due to atelectasis; breath sounds good this AM. -Incentive spirometry - instructed on proper use -PT/OT -OOB into chair -Ambulate patient   Hypergylcemia  Type 2 Diabetes  -70/30 Novolog 60 units BID with meals -Moderate coverage SSI -POC CBG monitoring QID   Hemorrhoids    Patients discomfort improved. -Anusol, QID   Hypertension Normotensive this AM   -Will continue to monitor -amlodipine 2.5 mg daily -Continue to hold losartan 25 mg and metoprolol 50 mg   HLD, Prior CVA -Lipitor 40 mg  -Continue Plavix 75 mg daily  Depression/Anxiety, Mild dementia -Zoloft 100 mg  -Continue Aricept 10 mg daily  GERD -Continue Protonix 40 mg daily  Dispo: Anticipated discharge in approximately 1-2 days.   Alphonzo Grieve, MD 05/07/2017, 3:52 PM

## 2017-05-07 NOTE — Progress Notes (Signed)
  Date: 05/07/2017  Patient name: Maurice Stone  Medical record number: [id number]  Date of birth: 09/26/1940   I have seen and evaluated this patient and I have discussed the plan of care with the house staff. Please see their note for complete details. I concur with their findings with the following additions/corrections:   Delightful 76 year old gentleman here with Escherichia coli UTI and associated bacteremia and sepsis. Continues to do well on IV ceftriaxone, will narrow to cefazolin based on culture sensitivities. If he continues to do well, will likely discharge tomorrow on cephalexin.  Oda Kilts, MD 05/07/2017, 7:23 PM

## 2017-05-08 LAB — BASIC METABOLIC PANEL
Anion gap: 8 (ref 5–15)
BUN: 8 mg/dL (ref 6–20)
CHLORIDE: 105 mmol/L (ref 101–111)
CO2: 26 mmol/L (ref 22–32)
Calcium: 8.9 mg/dL (ref 8.9–10.3)
Creatinine, Ser: 0.74 mg/dL (ref 0.61–1.24)
GFR calc Af Amer: >60 mL/min (ref >60)
GFR calc non Af Amer: >60 mL/min (ref >60)
Glucose, Bld: 122 mg/dL — ABNORMAL HIGH (ref 65–99)
POTASSIUM: 3.6 mmol/L (ref 3.5–5.1)
SODIUM: 139 mmol/L (ref 135–145)

## 2017-05-08 LAB — CBC
HCT: 35.4 % — ABNORMAL LOW (ref 39.0–52.0)
HEMOGLOBIN: 11.5 g/dL — AB (ref 13.0–17.0)
MCH: 29.3 pg (ref 26.0–34.0)
MCHC: 32.5 g/dL (ref 30.0–36.0)
MCV: 90.3 fL (ref 78.0–100.0)
Platelets: 255 10*3/uL (ref 150–400)
RBC: 3.92 MIL/uL — AB (ref 4.22–5.81)
RDW: 14.9 % (ref 11.5–15.5)
WBC: 9.8 10*3/uL (ref 4.0–10.5)

## 2017-05-08 LAB — GLUCOSE, CAPILLARY
GLUCOSE-CAPILLARY: 126 mg/dL — AB (ref 65–99)
Glucose-Capillary: 208 mg/dL — ABNORMAL HIGH (ref 65–99)

## 2017-05-08 MED ORDER — INSULIN ASPART PROT & ASPART (70-30 MIX) 100 UNIT/ML ~~LOC~~ SUSP: 70.0000 [IU] | Freq: Two times a day (BID) | SUBCUTANEOUS | 1 refills | Status: AC

## 2017-05-08 MED ORDER — CEPHALEXIN 500 MG PO CAPS: 500.0000 mg | ORAL_CAPSULE | Freq: Two times a day (BID) | ORAL | 0 refills | Status: AC

## 2017-05-08 MED ORDER — SERTRALINE HCL 100 MG PO TABS: 100.0000 mg | ORAL_TABLET | Freq: Every day | ORAL | 2 refills | Status: AC

## 2017-05-08 MED ORDER — NYSTATIN 100000 UNIT/ML MT SUSP: 5.0000 mL | Freq: Four times a day (QID) | OROMUCOSAL | 0 refills | Status: DC

## 2017-05-08 MED ORDER — HYDROCORTISONE 2.5 % RE CREA: TOPICAL_CREAM | Freq: Four times a day (QID) | RECTAL | 0 refills | Status: DC

## 2017-05-08 NOTE — Progress Notes (Signed)
Waiting on patients son to pick him up at 4 pm to d/c.

## 2017-05-08 NOTE — Discharge Summary (Signed)
Name: AKHIL PISCOPO MRN: [id number] DOB: April 16, 1941 76 y.o. PCP: [facility]  Date of Admission: 05/01/2017  2:05 PM Date of Discharge: 05/08/2017 Attending Physician: Annia Belt, MD  Discharge Diagnosis: 1. E coli bacteremia 2. E coli UTI Principal Problem:   E coli bacteremia Active Problems:   Hyperglycemia   Lower urinary tract infectious disease   Sepsis due to urinary tract infection (HCC)   Insulin dependent type 2 diabetes mellitus, uncontrolled ([place])   Hemorrhoids   Diabetic mononeuropathy associated with type 2 diabetes mellitus ([place])  Discharge Medications: Allergies as of 05/08/2017      Reactions   Metformin And Related Other (See Comments)   unspecified   Other Other (See Comments)   Nonsteroidal anti-inflammatory - unspecified   Lisinopril Cough      Medication List    STOP taking these medications   clotrimazole 1 % cream Commonly known as:  LOTRIMIN   empagliflozin 25 MG Tabs tablet Commonly known as:  JARDIANCE   losartan 25 MG tablet Commonly known as:  COZAAR   metoprolol succinate 50 MG 24 hr tablet Commonly known as:  TOPROL-XL   traMADol 50 MG tablet Commonly known as:  ULTRAM   urea 20 % cream Commonly known as:  CARMOL     TAKE these medications   amLODipine 2.5 MG tablet Commonly known as:  NORVASC Take 2.5 mg by mouth daily.   atorvastatin 40 MG tablet Commonly known as:  LIPITOR Take 40 mg by mouth at bedtime.   Carboxymethylcellulose Sodium 0.25 % Soln Place 1 drop into both eyes See admin instructions. 2-4 times daily   cephALEXin 500 MG capsule Commonly known as:  KEFLEX Take 1 capsule (500 mg total) by mouth 2 (two) times daily.   clopidogrel 75 MG tablet Commonly known as:  PLAVIX Take 1 tablet (75 mg total) by mouth daily.   donepezil 10 MG tablet Commonly known as:  ARICEPT Take 1 tablet (10 mg total) by mouth at bedtime. What changed:  when to take this   gabapentin 300 MG  capsule Commonly known as:  NEURONTIN Take 600 mg by mouth 3 (three) times daily.   hydrocortisone 2.5 % rectal cream Commonly known as:  ANUSOL-HC Place rectally 4 (four) times daily.   insulin aspart protamine- aspart (70-30) 100 UNIT/ML injection Commonly known as:  NOVOLOG MIX 70/30 Inject 0.7 mLs (70 Units total) into the skin 2 (two) times daily with a meal. What changed:  how much to take   metFORMIN 500 MG tablet Commonly known as:  GLUCOPHAGE Take 500 mg by mouth 2 (two) times daily.   nystatin 100000 UNIT/ML suspension Commonly known as:  MYCOSTATIN Take 5 mLs (500,000 Units total) by mouth 4 (four) times daily.   polyethylene glycol packet Commonly known as:  MIRALAX / GLYCOLAX Take 17 g by mouth daily.   Semaglutide 1 MG/DOSE Sopn Inject 1 mg into the skin once a week.   sertraline 100 MG tablet Commonly known as:  ZOLOFT Take 1 tablet (100 mg total) by mouth daily.       Disposition and follow-up:   Mr.Dahir E Patmon was discharged from [facility] in Stable condition.  At the hospital follow up visit please address:  1.   E coli bacteremia/UTI: --f/u BCx 10/10 - NGTD x4 days --Keflex 500mg  BID with end date 10/21 for complete 2 wk course of antibiotics  T2DM: --empagliflozin was held at admission and discharge due to inc  incidence of UTIs; consider restarting or changing to different oral hypoglycemic --novolog 70/30 mix 70 units BID; continue titrating for maximal glucose control; ensure patient is compliant with taking insulin  --metformin was continued at discharge  HTN: --BP stable on just amlodipine 2.5mg  daily during hospitalization --metoprolol and losartan were held at discharge; assess need for restarting  Chronic pain: Per family, patient had been taking tramadol frequently few days prior to admission due to moving apartments, and had associated somnolence. During admission, he did not require any analgesics other than  tylenol; tramadol was held at discharge. Consider further discontinuation of tramadol if seen appropriate.  Hemorrhoids: --annusol cream  HH was ordered at discharge for strength and endurance exercising; please ask if patient has been contracted and services started..  2.  Labs / imaging needed at time of follow-up: CBG log  3.  Pending labs/ test needing follow-up: BCx 05/04/17 - NGTD x4 days at discharge  Follow-up Appointments: Follow-up Information    [facility] Follow up.   Why:  Home health services arranged, office will call to set up home visits Contact information: [place] [address] [phone]        [facility]. Schedule an appointment as soon as possible for a visit in 2 week(s).   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 Patient quickly improved clinically. Urine cultures and Blood cultures revealed E coli that was resistant to ampicillin/amp-sulbactam, so he was continued on IV ceftriaxone for total of 7 days. Repeat Blood cultures from 10/10 at time of discharge were NGTD x3 days and patient had clinically improved, so he was discharged on further 7 day course of keflex 500mg  BID based on sensitivities to complete 2 week antibiotic treatment for  bacteremia. Echo was obtained and did not show any valvular vegetations. Patient was significantly hyperglycemic and glycosuric on admission 2/2 not taking his insulin regularly for a few days prior to admission, which likely had a predisposing effect on his UTI development. Patient also had been on empagliflozin prior to admission which may also predespose to UTIs; this was held during admission and at discharge. At follow up, please confirm patient has completed antibiotic course (end date 10/21), and consider restarting empagliflozin vs starting different oral hypoglycemic medicine.  Hyperglycemia T2DM: Patient on admission had significant hyperglycemia to 600s and glycosuria; Bmet and UA were not concerning for DKA. Patient was given fluid resuscitation and restarted on insulin. Patient admitted to missing doses on insulin prior to admission due to attempting to move apartments and feeling stressed about the move. He was titrated up on his novolog 70/30 mix and discharged on Novolog 70/30 mix 70 units BID and metformin 500mg  BID. Empagliflozin was held due to possible association of its use with UTIs; consider restarting it vs changing to different class of medication. Please assess compliance  with diabetes regimen and continue titrating insulin dosing as appropriate based on his home CBGs. HgbA1c on admission was 8.8.  HTN: At admission, patient had low-normal BPs in setting of sepsis and anti-hypertensives were held. During course of hospitalization, he was restarted on home dose of amlodipine 2.5mg  daily with good control of his blood pressure. Metoprolol and losartan were not restarted on discharge; please assess need for restarting these medicines.  Chronic pain: Per family, patient had been taking tramadol frequently few days prior to admission due to moving apartments, and had associated somnolence. During admission, he did not require any analgesics other than tylenol; tramadol was held at  discharge. Consider further discontinuation of tramadol if seen appropriate.  Hemorrhoids: Patient with complaints of symptomatic hemorrhoids on admission; exam revealed external hemorrhoids that were nonthrombosed and non bleeding. He was started on annusol cream with relief of his discomfort. This was continued on discharge.   Discharge Vitals:   BP 119/70 (BP Location: Right Arm)   Pulse 67   Temp 98.8 F (37.1 C) (Oral)   Resp 16   Ht 5\' 11"  (1.803 m)   Wt 186 lb (84.4 kg)   SpO2 95%   BMI 25.94 kg/m   Pertinent Labs, Studies, and Procedures:  CBC Latest Ref Rng & Units 05/08/2017 05/07/2017 05/06/2017  WBC 4.0 - 10.5 K/uL 9.8 9.8 9.8  Hemoglobin 13.0 - 17.0 g/dL 11.5(L) 11.6(L) 11.7(L)  Hematocrit 39.0 - 52.0 % 35.4(L) 35.8(L) 36.0(L)  Platelets 150 - 400 K/uL 255 233 219   BMP Latest Ref Rng & Units 05/08/2017 05/07/2017 05/06/2017  Glucose 65 - 99 mg/dL 122(H) 233(H) 96  BUN 6 - 20 mg/dL 8 8 7   Creatinine 0.61 - 1.24 mg/dL 0.74 0.73 0.75  Sodium 135 - 145 mmol/L 139 135 139  Potassium 3.5 - 5.1 mmol/L 3.6 4.0 3.6  Chloride 101 - 111 mmol/L 105 104 105  CO2 22 - 32 mmol/L 26 25 26   Calcium 8.9 - 10.3 mg/dL 8.9 8.6(L) 8.8(L)   Blood culture 05/01/17: E coli - resistant to amp/amp-sulbactam Urine culture 05/01/17: E coli Blood culture 05/04/17: NGTDx4 days  Hgb A1c 8.8  Echo 05/02/17:  - Left ventricle: The cavity size was normal. Wall thickness was   increased in a pattern of mild LVH. Systolic function was normal.   The estimated ejection fraction was in the range of 55% to 60%.   Wall motion was normal; there were no regional wall motion   abnormalities. Doppler parameters are consistent with abnormal   left ventricular relaxation (grade 1 diastolic dysfunction). - Aortic valve: There was trivial regurgitation. 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 - Pulmonary arteries: Systolic pressure was mildly increased. PA   peak pressure: 38 mm Hg  (S).  Discharge Instructions: Discharge Instructions    Call MD for:  difficulty breathing, headache or visual disturbances    Complete by:  As directed    Call MD for:  extreme fatigue    Complete by:  As directed    Call MD for:  hives    Complete by:  As directed    Call MD for:  persistant dizziness or light-headedness    Complete by:  As directed    Call MD for:  persistant nausea and vomiting    Complete by:  As directed    Call MD for:  redness, tenderness, or signs of infection (pain, swelling, redness, odor or green/yellow discharge around incision site)    Complete by:  As directed    Call MD for:  severe uncontrolled pain    Complete by:  As directed    Call MD for:  temperature >100.4    Complete by:  As directed    Diet - low sodium heart healthy    Complete by:  As directed    Discharge instructions    Complete by:  As directed    For your urinary tract infection: --take keflex 500mg  twice a day for 7 days (until you run out of pills).  For your diabetes: --take Novolog 70/30 mix insulin 70 units twice daily with meals --continue your weekly injection --stop empagliflozin --follow up with your primary care provider and they will further adjust your medications  For your blood pressure:  --continue amlodipine 2.5mg  daily --hold your losartan and metoprolol until you see your primary care provider and they will see if it is appropriate to restart these medications.   Increase activity slowly    Complete by:  As directed       Signed: Alphonzo Grieve, MD 05/08/2017, 1:39 PM

## 2017-05-08 NOTE — Progress Notes (Signed)
  Date: 05/08/2017  Patient name: Maurice Stone  Medical record number: [id number]  Date of birth: Aug 10, 1940   I have seen and evaluated this patient and I have discussed the plan of care with the house staff. Please see their note for complete details. I concur with their findings with the following additions/corrections:   76 year old male admitted with Escherichia coli UTI with bacteremia. Continues to do well, and we were able to narrow to cefazolin yesterday based on culture results. He remains afebrile and will be safe for discharge on 7 more days of cephalexin.  Oda Kilts, MD 05/08/2017, 1:36 PM

## 2017-05-08 NOTE — Progress Notes (Signed)
Patient discharge teaching given, including activity, diet, follow-up appoints, and medications. Patient verbalized understanding of all discharge instructions. IV access was d/c'd. Vitals are stable. Skin is intact except as charted in most recent assessments. Pt to be escorted out by NT, to be driven home by family. 

## 2017-05-08 NOTE — Progress Notes (Signed)
   Subjective: Patient denies chest pain, shortness of breath, abd pain, nausea, vomiting, fevers, chills. He feels well and is in agreement to discharge home today with 7 day course of PO antibiotics. We discussed importance of adherence to his insulin.  Objective:  Vital signs in last 24 hours: Vitals:   05/07/17 0923 05/07/17 1419 05/07/17 2110 05/08/17 0533  BP: 140/86 109/68 113/67 119/70  Pulse:  76 68 67  Resp:  17 18 16   Temp:  98.7 F (37.1 C) 98.3 F (36.8 C) 98.8 F (37.1 C)  TempSrc:   Oral Oral  SpO2:  97% 99% 95%  Weight:      Height:       General: Sitting up in bed Cardiac: RRR, No R/M/G appreciated Pulm: normal effort, good aeration throughout lung fields bilaterally, no rales or wheezes appreciated Abd: soft, non tender, non distended, BS normal Ext: extremities well perfused, no peripheral edema Neuro: alert and oriented X3, cranial nerves II-XII grossly intact  Assessment/Plan:  Principal Problem:   E coli bacteremia Active Problems:   Hyperglycemia   Lower urinary tract infectious disease   Sepsis due to urinary tract infection (HCC)   Insulin dependent type 2 diabetes mellitus, uncontrolled (HCC)   Hemorrhoids   Diabetic mononeuropathy associated with type 2 diabetes mellitus (HCC)  Escherichia coli bacteremia, UTI Patient with E. Coli bacteremia in setting of UTI. Patient afebrile with stable vital signs. Responding well to abx treatment. He was narrowed from ceftriaxone to ancef yesterday and did not have fevers or WBC elevation.  -today is day 7 of IV antibiotics; will discharge on 7 days of keflex to complete 2 wk course -Repeat Blood cultures NGTD x days   Hypergylcemia  Type 2 Diabetes  -70/30 Novolog 60 units BID with meals -Moderate coverage SSI -POC CBG monitoring QID   Hemorrhoids  Patients discomfort improved. -Anusol, QID   Hypertension Normotensive this AM   -Will continue to monitor -amlodipine 2.5 mg daily -Continue to  hold losartan 25 mg and metoprolol 50 mg  HLD, Prior CVA -Lipitor 40 mg  -Continue Plavix 75 mg daily  Depression/Anxiety, Mild dementia -Zoloft 100 mg  -Continue Aricept 10 mg daily  GERD -Continue Protonix 40 mg daily  Dispo: Anticipated discharge today.   Alphonzo Grieve, MD 05/08/2017, 1:26 PM

## 2017-05-09 LAB — CULTURE, BLOOD (ROUTINE X 2)
CULTURE: NO GROWTH
Culture: NO GROWTH
SPECIAL REQUESTS: ADEQUATE
SPECIAL REQUESTS: ADEQUATE

## 2017-06-08 ENCOUNTER — Other Ambulatory Visit: Payer: Self-pay

## 2017-06-08 ENCOUNTER — Encounter (HOSPITAL_COMMUNITY): Payer: Self-pay | Admitting: Emergency Medicine

## 2017-06-08 ENCOUNTER — Emergency Department (HOSPITAL_COMMUNITY)
Admission: EM | Admit: 2017-06-08 | Discharge: 2017-06-08 | Disposition: A | Discharge status: Home / Self Care | Payer: Non-veteran care | Attending: Emergency Medicine | Admitting: Emergency Medicine

## 2017-06-08 DIAGNOSIS — B3749 Other urogenital candidiasis: Secondary | ICD-10-CM | POA: Insufficient documentation

## 2017-06-08 DIAGNOSIS — Z87891 Personal history of nicotine dependence: Secondary | ICD-10-CM | POA: Insufficient documentation

## 2017-06-08 DIAGNOSIS — I2510 Atherosclerotic heart disease of native coronary artery without angina pectoris: Secondary | ICD-10-CM | POA: Diagnosis not present

## 2017-06-08 DIAGNOSIS — Z794 Long term (current) use of insulin: Secondary | ICD-10-CM | POA: Insufficient documentation

## 2017-06-08 DIAGNOSIS — F0390 Unspecified dementia without behavioral disturbance: Secondary | ICD-10-CM | POA: Insufficient documentation

## 2017-06-08 DIAGNOSIS — J449 Chronic obstructive pulmonary disease, unspecified: Secondary | ICD-10-CM | POA: Insufficient documentation

## 2017-06-08 DIAGNOSIS — E1165 Type 2 diabetes mellitus with hyperglycemia: Secondary | ICD-10-CM | POA: Insufficient documentation

## 2017-06-08 DIAGNOSIS — R21 Rash and other nonspecific skin eruption: Secondary | ICD-10-CM | POA: Diagnosis not present

## 2017-06-08 DIAGNOSIS — I10 Essential (primary) hypertension: Secondary | ICD-10-CM | POA: Diagnosis not present

## 2017-06-08 DIAGNOSIS — B379 Candidiasis, unspecified: Secondary | ICD-10-CM

## 2017-06-08 DIAGNOSIS — Z79899 Other long term (current) drug therapy: Secondary | ICD-10-CM | POA: Diagnosis not present

## 2017-06-08 DIAGNOSIS — R739 Hyperglycemia, unspecified: Secondary | ICD-10-CM

## 2017-06-08 DIAGNOSIS — Z8673 Personal history of transient ischemic attack (TIA), and cerebral infarction without residual deficits: Secondary | ICD-10-CM | POA: Diagnosis not present

## 2017-06-08 LAB — CBC
HCT: 41 % (ref 39.0–52.0)
Hemoglobin: 14.2 g/dL (ref 13.0–17.0)
MCH: 30.5 pg (ref 26.0–34.0)
MCHC: 34.6 g/dL (ref 30.0–36.0)
MCV: 88 fL (ref 78.0–100.0)
PLATELETS: 151 10*3/uL (ref 150–400)
RBC: 4.66 MIL/uL (ref 4.22–5.81)
RDW: 14 % (ref 11.5–15.5)
WBC: 8.9 10*3/uL (ref 4.0–10.5)

## 2017-06-08 LAB — BASIC METABOLIC PANEL
Anion gap: 10 (ref 5–15)
BUN: 11 mg/dL (ref 6–20)
CHLORIDE: 101 mmol/L (ref 101–111)
CO2: 23 mmol/L (ref 22–32)
CREATININE: 0.89 mg/dL (ref 0.61–1.24)
Calcium: 9.6 mg/dL (ref 8.9–10.3)
GFR calc Af Amer: >60 mL/min (ref >60)
GFR calc non Af Amer: >60 mL/min (ref >60)
GLUCOSE: 346 mg/dL — AB (ref 65–99)
Potassium: 3.7 mmol/L (ref 3.5–5.1)
Sodium: 134 mmol/L — ABNORMAL LOW (ref 135–145)

## 2017-06-08 LAB — URINALYSIS, ROUTINE W REFLEX MICROSCOPIC
BACTERIA UA: NONE SEEN
Bilirubin Urine: NEGATIVE
Hgb urine dipstick: NEGATIVE
Ketones, ur: NEGATIVE mg/dL
Leukocytes, UA: NEGATIVE
Nitrite: NEGATIVE
PH: 6 (ref 5.0–8.0)
PROTEIN: NEGATIVE mg/dL
SQUAMOUS EPITHELIAL / LPF: NONE SEEN
Specific Gravity, Urine: 1.024 (ref 1.005–1.030)

## 2017-06-08 LAB — CBG MONITORING, ED
Glucose-Capillary: 374 mg/dL — ABNORMAL HIGH (ref 65–99)
Glucose-Capillary: 137 mg/dL — ABNORMAL HIGH (ref 65–99)

## 2017-06-08 MED ORDER — CLOTRIMAZOLE 1 % EX CREA: TOPICAL_CREAM | CUTANEOUS | 0 refills | Status: AC

## 2017-06-08 MED ORDER — SODIUM CHLORIDE 0.9 % IV BOLUS (SEPSIS)
500.0000 mL | Freq: Once | INTRAVENOUS | Status: AC
  Administered 2017-06-08: 500 mL via INTRAVENOUS

## 2017-06-08 NOTE — ED Notes (Signed)
ED Provider at bedside. 

## 2017-06-08 NOTE — ED Triage Notes (Signed)
Pt arrives from [facility] via [facility] c/o hyperglycemia and peeling skin on his scrotum.  Pt reports forgetting to take insulin for past few days, and reports skin peeling x 1 week, unrelieved with OTC.   Pt Aox4 on arrival, EMS reports giving 500 mL NS.

## 2017-06-08 NOTE — Discharge Instructions (Signed)
Your workup has been reassuring in the ED.  Your blood sugar has improved.  You seem to have a fungal infection on your scrotum.  Please use the cream as prescribed.  Wash daily.  Follow-up with your primary care doctor next week.  Make sure you take your insulin as prescribed.
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
## 2017-06-10 LAB — URINE CULTURE

## 2017-06-11 ENCOUNTER — Telehealth: Payer: Self-pay

## 2017-06-11 NOTE — Telephone Encounter (Signed)
No treatment for UC from ED 06/08/17 per [facility]

## 2017-08-31 ENCOUNTER — Emergency Department (HOSPITAL_BASED_OUTPATIENT_CLINIC_OR_DEPARTMENT_OTHER)
Admission: EM | Admit: 2017-08-31 | Discharge: 2017-08-31 | Disposition: A | Discharge status: Home / Self Care | Payer: Medicare Other | Attending: Emergency Medicine | Admitting: Emergency Medicine

## 2017-08-31 ENCOUNTER — Emergency Department (HOSPITAL_BASED_OUTPATIENT_CLINIC_OR_DEPARTMENT_OTHER): Payer: Medicare Other

## 2017-08-31 ENCOUNTER — Encounter (HOSPITAL_BASED_OUTPATIENT_CLINIC_OR_DEPARTMENT_OTHER): Payer: Self-pay | Admitting: Emergency Medicine

## 2017-08-31 ENCOUNTER — Other Ambulatory Visit: Payer: Self-pay

## 2017-08-31 DIAGNOSIS — Z8673 Personal history of transient ischemic attack (TIA), and cerebral infarction without residual deficits: Secondary | ICD-10-CM | POA: Insufficient documentation

## 2017-08-31 DIAGNOSIS — Z7902 Long term (current) use of antithrombotics/antiplatelets: Secondary | ICD-10-CM | POA: Insufficient documentation

## 2017-08-31 DIAGNOSIS — Z794 Long term (current) use of insulin: Secondary | ICD-10-CM | POA: Insufficient documentation

## 2017-08-31 DIAGNOSIS — E119 Type 2 diabetes mellitus without complications: Secondary | ICD-10-CM | POA: Insufficient documentation

## 2017-08-31 DIAGNOSIS — I10 Essential (primary) hypertension: Secondary | ICD-10-CM | POA: Insufficient documentation

## 2017-08-31 DIAGNOSIS — I2510 Atherosclerotic heart disease of native coronary artery without angina pectoris: Secondary | ICD-10-CM | POA: Insufficient documentation

## 2017-08-31 DIAGNOSIS — Z79899 Other long term (current) drug therapy: Secondary | ICD-10-CM | POA: Insufficient documentation

## 2017-08-31 DIAGNOSIS — M25552 Pain in left hip: Secondary | ICD-10-CM | POA: Insufficient documentation

## 2017-08-31 DIAGNOSIS — J449 Chronic obstructive pulmonary disease, unspecified: Secondary | ICD-10-CM | POA: Insufficient documentation

## 2017-08-31 DIAGNOSIS — Z87891 Personal history of nicotine dependence: Secondary | ICD-10-CM | POA: Insufficient documentation

## 2017-08-31 DIAGNOSIS — F0390 Unspecified dementia without behavioral disturbance: Secondary | ICD-10-CM | POA: Insufficient documentation

## 2017-08-31 MED ORDER — TRAMADOL HCL 50 MG PO TABS: 50.0000 mg | ORAL_TABLET | Freq: Four times a day (QID) | ORAL | 0 refills | Status: DC | PRN

## 2017-08-31 MED ORDER — TRAMADOL HCL 50 MG PO TABS
25.0000 mg | ORAL_TABLET | Freq: Once | ORAL | Status: AC
  Administered 2017-08-31: 25 mg via ORAL
  Filled 2017-08-31: qty 1

## 2017-08-31 NOTE — ED Notes (Signed)
ED Provider at bedside. 

## 2017-08-31 NOTE — ED Triage Notes (Signed)
Per EMS pt from home , reports left leg apin x 1 week, denies injury nor fall. debnies chest pain nor shortness of breath, denies HX DVT, reports Hx neuropathy and HTN. Alert and oriented x 4.

## 2017-08-31 NOTE — ED Notes (Signed)
US at bedside
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
## 2017-08-31 NOTE — Discharge Instructions (Signed)
Your workup has been reassuring in the ED including the ultrasound and x-ray.  No signs of fracture or blood clot.  Unknown cause of your symptoms.  Would probably benefit from seeing orthopedic doctor.  We will give a short course of pain medication.  This medication will make you drowsy so do not drive with it.  I would also make sure that you continue take Motrin and Tylenol at home for the pain.  Apply ice as needed.  Return to the ED if any worsening symptoms.

## 2017-08-31 NOTE — ED Notes (Signed)
[facility] called for transport.  

## 2017-12-17 ENCOUNTER — Emergency Department (HOSPITAL_BASED_OUTPATIENT_CLINIC_OR_DEPARTMENT_OTHER): Payer: No Typology Code available for payment source

## 2017-12-17 ENCOUNTER — Encounter (HOSPITAL_BASED_OUTPATIENT_CLINIC_OR_DEPARTMENT_OTHER): Payer: Self-pay | Admitting: Emergency Medicine

## 2017-12-17 ENCOUNTER — Emergency Department (HOSPITAL_BASED_OUTPATIENT_CLINIC_OR_DEPARTMENT_OTHER)
Admission: EM | Admit: 2017-12-17 | Discharge: 2017-12-17 | Disposition: A | Discharge status: Home / Self Care | Payer: No Typology Code available for payment source | Attending: Emergency Medicine | Admitting: Emergency Medicine

## 2017-12-17 ENCOUNTER — Other Ambulatory Visit: Payer: Self-pay

## 2017-12-17 DIAGNOSIS — Z87891 Personal history of nicotine dependence: Secondary | ICD-10-CM | POA: Insufficient documentation

## 2017-12-17 DIAGNOSIS — Y929 Unspecified place or not applicable: Secondary | ICD-10-CM | POA: Insufficient documentation

## 2017-12-17 DIAGNOSIS — J449 Chronic obstructive pulmonary disease, unspecified: Secondary | ICD-10-CM | POA: Insufficient documentation

## 2017-12-17 DIAGNOSIS — E1165 Type 2 diabetes mellitus with hyperglycemia: Secondary | ICD-10-CM | POA: Diagnosis not present

## 2017-12-17 DIAGNOSIS — Z8673 Personal history of transient ischemic attack (TIA), and cerebral infarction without residual deficits: Secondary | ICD-10-CM | POA: Insufficient documentation

## 2017-12-17 DIAGNOSIS — Z79899 Other long term (current) drug therapy: Secondary | ICD-10-CM | POA: Insufficient documentation

## 2017-12-17 DIAGNOSIS — N390 Urinary tract infection, site not specified: Secondary | ICD-10-CM | POA: Diagnosis not present

## 2017-12-17 DIAGNOSIS — Y999 Unspecified external cause status: Secondary | ICD-10-CM | POA: Diagnosis not present

## 2017-12-17 DIAGNOSIS — I2510 Atherosclerotic heart disease of native coronary artery without angina pectoris: Secondary | ICD-10-CM | POA: Insufficient documentation

## 2017-12-17 DIAGNOSIS — S9032XA Contusion of left foot, initial encounter: Secondary | ICD-10-CM

## 2017-12-17 DIAGNOSIS — Z7901 Long term (current) use of anticoagulants: Secondary | ICD-10-CM | POA: Diagnosis not present

## 2017-12-17 DIAGNOSIS — Y939 Activity, unspecified: Secondary | ICD-10-CM | POA: Diagnosis not present

## 2017-12-17 DIAGNOSIS — S9031XA Contusion of right foot, initial encounter: Secondary | ICD-10-CM | POA: Diagnosis not present

## 2017-12-17 DIAGNOSIS — Z794 Long term (current) use of insulin: Secondary | ICD-10-CM | POA: Diagnosis not present

## 2017-12-17 DIAGNOSIS — R739 Hyperglycemia, unspecified: Secondary | ICD-10-CM

## 2017-12-17 LAB — CBC
HEMATOCRIT: 40.7 % (ref 39.0–52.0)
Hemoglobin: 14.6 g/dL (ref 13.0–17.0)
MCH: 31.8 pg (ref 26.0–34.0)
MCHC: 35.9 g/dL (ref 30.0–36.0)
MCV: 88.7 fL (ref 78.0–100.0)
PLATELETS: 165 10*3/uL (ref 150–400)
RBC: 4.59 MIL/uL (ref 4.22–5.81)
RDW: 13.5 % (ref 11.5–15.5)
WBC: 6.8 10*3/uL (ref 4.0–10.5)

## 2017-12-17 LAB — CBG MONITORING, ED
Glucose-Capillary: 594 mg/dL (ref 65–99)
Glucose-Capillary: 514 mg/dL (ref 65–99)
Glucose-Capillary: 528 mg/dL (ref 65–99)

## 2017-12-17 LAB — URINALYSIS, ROUTINE W REFLEX MICROSCOPIC
BILIRUBIN URINE: NEGATIVE
Hgb urine dipstick: NEGATIVE
Ketones, ur: NEGATIVE mg/dL
LEUKOCYTES UA: NEGATIVE
NITRITE: NEGATIVE
PH: 6 (ref 5.0–8.0)
Protein, ur: NEGATIVE mg/dL
Specific Gravity, Urine: <1.005 — ABNORMAL LOW (ref 1.005–1.030)

## 2017-12-17 LAB — BASIC METABOLIC PANEL
Anion gap: 12 (ref 5–15)
BUN: 17 mg/dL (ref 6–20)
CO2: 24 mmol/L (ref 22–32)
CREATININE: 1.01 mg/dL (ref 0.61–1.24)
Calcium: 9.3 mg/dL (ref 8.9–10.3)
Chloride: 92 mmol/L — ABNORMAL LOW (ref 101–111)
GFR calc Af Amer: >60 mL/min (ref >60)
GLUCOSE: 720 mg/dL — AB (ref 65–99)
Potassium: 4.4 mmol/L (ref 3.5–5.1)
SODIUM: 128 mmol/L — AB (ref 135–145)

## 2017-12-17 LAB — URINALYSIS, MICROSCOPIC (REFLEX)

## 2017-12-17 MED ORDER — SODIUM CHLORIDE 0.9 % IV SOLN
1.0000 g | Freq: Once | INTRAVENOUS | Status: AC
  Administered 2017-12-17: 1 g via INTRAVENOUS
  Filled 2017-12-17: qty 10

## 2017-12-17 MED ORDER — SODIUM CHLORIDE 0.9 % IV BOLUS
1000.0000 mL | Freq: Once | INTRAVENOUS | Status: AC
  Administered 2017-12-17: 1000 mL via INTRAVENOUS

## 2017-12-17 MED ORDER — INSULIN REGULAR HUMAN 100 UNIT/ML IJ SOLN
10.0000 [IU] | Freq: Once | INTRAMUSCULAR | Status: AC
  Administered 2017-12-17: 10 [IU] via SUBCUTANEOUS
  Filled 2017-12-17: qty 1

## 2017-12-17 MED ORDER — CEPHALEXIN 500 MG PO CAPS: 500.0000 mg | ORAL_CAPSULE | Freq: Two times a day (BID) | ORAL | 0 refills | Status: DC

## 2017-12-17 NOTE — ED Notes (Signed)
CBG [id number] reported to DR. Ralene Bathe

## 2017-12-17 NOTE — ED Notes (Signed)
Called for pt, no answer. Will try again later

## 2017-12-17 NOTE — ED Notes (Addendum)
ED Provider at bedside. She is on phone with pt's family discussing f/u care and transportation home

## 2017-12-17 NOTE — Discharge Instructions (Signed)
Do not drive until cleared by your [facility] doctor. Get rechecked if you have any new or concerning symptoms.  Remember to take your diabetic medications as prescribed.

## 2017-12-17 NOTE — ED Notes (Addendum)
Pt transported home by [employer] (sent by pt's daughter). Pt given Rx x 1 for Keflex. Pt verbalizes understanding to f/u with his PCP at the [facility] this week and to start taking antibiotics as directed. 20g PIV removed from pt's Right AC (placed prior to assuming care for pt), cath intact, site WNL
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
## 2017-12-17 NOTE — ED Notes (Signed)
Date and time results received: 12/17/17 1308   Test: glucose Critical Value:719 Name of Provider Notified: rees  Orders Received? Or Actions Taken?: no orders given

## 2017-12-17 NOTE — ED Notes (Signed)
X-ray at bedside

## 2017-12-19 LAB — URINE CULTURE

## 2019-01-16 ENCOUNTER — Emergency Department (HOSPITAL_COMMUNITY): Payer: No Typology Code available for payment source

## 2019-01-16 ENCOUNTER — Encounter (HOSPITAL_COMMUNITY): Payer: Self-pay | Admitting: Emergency Medicine

## 2019-01-16 ENCOUNTER — Other Ambulatory Visit: Payer: Self-pay

## 2019-01-16 ENCOUNTER — Emergency Department (HOSPITAL_COMMUNITY)
Admission: EM | Admit: 2019-01-16 | Discharge: 2019-01-16 | Disposition: A | Discharge status: Home / Self Care | Payer: No Typology Code available for payment source | Attending: Emergency Medicine | Admitting: Emergency Medicine

## 2019-01-16 DIAGNOSIS — Z8673 Personal history of transient ischemic attack (TIA), and cerebral infarction without residual deficits: Secondary | ICD-10-CM | POA: Insufficient documentation

## 2019-01-16 DIAGNOSIS — F0390 Unspecified dementia without behavioral disturbance: Secondary | ICD-10-CM | POA: Diagnosis not present

## 2019-01-16 DIAGNOSIS — I2510 Atherosclerotic heart disease of native coronary artery without angina pectoris: Secondary | ICD-10-CM | POA: Diagnosis not present

## 2019-01-16 DIAGNOSIS — Z7902 Long term (current) use of antithrombotics/antiplatelets: Secondary | ICD-10-CM | POA: Diagnosis not present

## 2019-01-16 DIAGNOSIS — Z794 Long term (current) use of insulin: Secondary | ICD-10-CM | POA: Insufficient documentation

## 2019-01-16 DIAGNOSIS — Z041 Encounter for examination and observation following transport accident: Secondary | ICD-10-CM | POA: Insufficient documentation

## 2019-01-16 DIAGNOSIS — E119 Type 2 diabetes mellitus without complications: Secondary | ICD-10-CM | POA: Insufficient documentation

## 2019-01-16 DIAGNOSIS — Z79899 Other long term (current) drug therapy: Secondary | ICD-10-CM | POA: Insufficient documentation

## 2019-01-16 DIAGNOSIS — J449 Chronic obstructive pulmonary disease, unspecified: Secondary | ICD-10-CM | POA: Insufficient documentation

## 2019-01-16 DIAGNOSIS — R4182 Altered mental status, unspecified: Secondary | ICD-10-CM | POA: Insufficient documentation

## 2019-01-16 DIAGNOSIS — Z87891 Personal history of nicotine dependence: Secondary | ICD-10-CM | POA: Insufficient documentation

## 2019-01-16 DIAGNOSIS — I10 Essential (primary) hypertension: Secondary | ICD-10-CM | POA: Diagnosis not present

## 2019-01-16 LAB — CBC WITH DIFFERENTIAL/PLATELET
Abs Immature Granulocytes: 0.03 10*3/uL (ref 0.00–0.07)
Basophils Absolute: 0 10*3/uL (ref 0.0–0.1)
Basophils Relative: 0 %
Eosinophils Absolute: 0.2 10*3/uL (ref 0.0–0.5)
Eosinophils Relative: 2 %
HCT: 39.9 % (ref 39.0–52.0)
Hemoglobin: 13.5 g/dL (ref 13.0–17.0)
Immature Granulocytes: 0 %
Lymphocytes Relative: 12 %
Lymphs Abs: 1.2 10*3/uL (ref 0.7–4.0)
MCH: 29.3 pg (ref 26.0–34.0)
MCHC: 33.8 g/dL (ref 30.0–36.0)
MCV: 86.6 fL (ref 80.0–100.0)
Monocytes Absolute: 0.8 10*3/uL (ref 0.1–1.0)
Monocytes Relative: 9 %
Neutro Abs: 7.4 10*3/uL (ref 1.7–7.7)
Neutrophils Relative %: 77 %
Platelets: 124 10*3/uL — ABNORMAL LOW (ref 150–400)
RBC: 4.61 MIL/uL (ref 4.22–5.81)
RDW: 13.2 % (ref 11.5–15.5)
WBC: 9.6 10*3/uL (ref 4.0–10.5)
nRBC: 0 % (ref 0.0–0.2)

## 2019-01-16 LAB — BASIC METABOLIC PANEL
Anion gap: 16 — ABNORMAL HIGH (ref 5–15)
BUN: 14 mg/dL (ref 8–23)
CO2: 23 mmol/L (ref 22–32)
Calcium: 9.5 mg/dL (ref 8.9–10.3)
Chloride: 97 mmol/L — ABNORMAL LOW (ref 98–111)
Creatinine, Ser: 1.26 mg/dL — ABNORMAL HIGH (ref 0.61–1.24)
GFR calc Af Amer: >60 mL/min (ref >60)
GFR calc non Af Amer: 55 mL/min — ABNORMAL LOW (ref >60)
Glucose, Bld: 210 mg/dL — ABNORMAL HIGH (ref 70–99)
Potassium: 3.9 mmol/L (ref 3.5–5.1)
Sodium: 136 mmol/L (ref 135–145)

## 2019-01-16 LAB — ETHANOL: Alcohol, Ethyl (B): 53 mg/dL — ABNORMAL HIGH (ref <10)

## 2019-01-16 LAB — TROPONIN I (HIGH SENSITIVITY)
Troponin I (High Sensitivity): 9 ng/L (ref <18)
Troponin I (High Sensitivity): 12 ng/L (ref <18)
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
## 2019-01-16 NOTE — ED Notes (Signed)
Pt very disoriented on arrival responding only to his name and to pain, unknown last seen well time. Per ems pt states he normally has some right side weakness.
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
## 2019-01-16 NOTE — ED Notes (Signed)
Patient transported to CT 

## 2019-02-11 ENCOUNTER — Other Ambulatory Visit: Payer: Self-pay

## 2019-02-11 ENCOUNTER — Inpatient Hospital Stay (HOSPITAL_COMMUNITY)
Admission: EM | Admit: 2019-02-11 | Discharge: 2019-03-27 | DRG: 870 | Disposition: E | Payer: Medicare Other | Attending: Internal Medicine | Admitting: Internal Medicine

## 2019-02-11 ENCOUNTER — Emergency Department (HOSPITAL_COMMUNITY): Payer: Medicare Other

## 2019-02-11 ENCOUNTER — Encounter (HOSPITAL_COMMUNITY): Payer: Self-pay

## 2019-02-11 DIAGNOSIS — N390 Urinary tract infection, site not specified: Secondary | ICD-10-CM | POA: Diagnosis present

## 2019-02-11 DIAGNOSIS — E0811 Diabetes mellitus due to underlying condition with ketoacidosis with coma: Secondary | ICD-10-CM | POA: Diagnosis not present

## 2019-02-11 DIAGNOSIS — E785 Hyperlipidemia, unspecified: Secondary | ICD-10-CM | POA: Diagnosis present

## 2019-02-11 DIAGNOSIS — A419 Sepsis, unspecified organism: Secondary | ICD-10-CM | POA: Diagnosis not present

## 2019-02-11 DIAGNOSIS — R7881 Bacteremia: Secondary | ICD-10-CM | POA: Diagnosis not present

## 2019-02-11 DIAGNOSIS — E1110 Type 2 diabetes mellitus with ketoacidosis without coma: Secondary | ICD-10-CM | POA: Diagnosis present

## 2019-02-11 DIAGNOSIS — E0810 Diabetes mellitus due to underlying condition with ketoacidosis without coma: Secondary | ICD-10-CM | POA: Diagnosis not present

## 2019-02-11 DIAGNOSIS — I2510 Atherosclerotic heart disease of native coronary artery without angina pectoris: Secondary | ICD-10-CM | POA: Diagnosis present

## 2019-02-11 DIAGNOSIS — N170 Acute kidney failure with tubular necrosis: Secondary | ICD-10-CM | POA: Diagnosis present

## 2019-02-11 DIAGNOSIS — F0390 Unspecified dementia without behavioral disturbance: Secondary | ICD-10-CM | POA: Diagnosis present

## 2019-02-11 DIAGNOSIS — K219 Gastro-esophageal reflux disease without esophagitis: Secondary | ICD-10-CM | POA: Diagnosis present

## 2019-02-11 DIAGNOSIS — Z8249 Family history of ischemic heart disease and other diseases of the circulatory system: Secondary | ICD-10-CM

## 2019-02-11 DIAGNOSIS — Z9989 Dependence on other enabling machines and devices: Secondary | ICD-10-CM | POA: Diagnosis not present

## 2019-02-11 DIAGNOSIS — C9000 Multiple myeloma not having achieved remission: Secondary | ICD-10-CM | POA: Diagnosis not present

## 2019-02-11 DIAGNOSIS — Z96652 Presence of left artificial knee joint: Secondary | ICD-10-CM | POA: Diagnosis present

## 2019-02-11 DIAGNOSIS — K117 Disturbances of salivary secretion: Secondary | ICD-10-CM

## 2019-02-11 DIAGNOSIS — J9601 Acute respiratory failure with hypoxia: Secondary | ICD-10-CM | POA: Diagnosis not present

## 2019-02-11 DIAGNOSIS — Z66 Do not resuscitate: Secondary | ICD-10-CM

## 2019-02-11 DIAGNOSIS — I69351 Hemiplegia and hemiparesis following cerebral infarction affecting right dominant side: Secondary | ICD-10-CM | POA: Diagnosis not present

## 2019-02-11 DIAGNOSIS — I5033 Acute on chronic diastolic (congestive) heart failure: Secondary | ICD-10-CM | POA: Diagnosis present

## 2019-02-11 DIAGNOSIS — Z4659 Encounter for fitting and adjustment of other gastrointestinal appliance and device: Secondary | ICD-10-CM | POA: Diagnosis not present

## 2019-02-11 DIAGNOSIS — Z978 Presence of other specified devices: Secondary | ICD-10-CM

## 2019-02-11 DIAGNOSIS — J810 Acute pulmonary edema: Secondary | ICD-10-CM | POA: Diagnosis not present

## 2019-02-11 DIAGNOSIS — Z1611 Resistance to penicillins: Secondary | ICD-10-CM | POA: Diagnosis present

## 2019-02-11 DIAGNOSIS — F1010 Alcohol abuse, uncomplicated: Secondary | ICD-10-CM | POA: Diagnosis present

## 2019-02-11 DIAGNOSIS — Y848 Other medical procedures as the cause of abnormal reaction of the patient, or of later complication, without mention of misadventure at the time of the procedure: Secondary | ICD-10-CM | POA: Diagnosis not present

## 2019-02-11 DIAGNOSIS — D696 Thrombocytopenia, unspecified: Secondary | ICD-10-CM | POA: Diagnosis not present

## 2019-02-11 DIAGNOSIS — Z6821 Body mass index (BMI) 21.0-21.9, adult: Secondary | ICD-10-CM

## 2019-02-11 DIAGNOSIS — Z7189 Other specified counseling: Secondary | ICD-10-CM

## 2019-02-11 DIAGNOSIS — M6282 Rhabdomyolysis: Secondary | ICD-10-CM | POA: Diagnosis present

## 2019-02-11 DIAGNOSIS — Z87891 Personal history of nicotine dependence: Secondary | ICD-10-CM

## 2019-02-11 DIAGNOSIS — J189 Pneumonia, unspecified organism: Secondary | ICD-10-CM | POA: Diagnosis not present

## 2019-02-11 DIAGNOSIS — R410 Disorientation, unspecified: Secondary | ICD-10-CM | POA: Diagnosis not present

## 2019-02-11 DIAGNOSIS — R6520 Severe sepsis without septic shock: Secondary | ICD-10-CM | POA: Diagnosis not present

## 2019-02-11 DIAGNOSIS — Z79899 Other long term (current) drug therapy: Secondary | ICD-10-CM | POA: Diagnosis not present

## 2019-02-11 DIAGNOSIS — R0902 Hypoxemia: Secondary | ICD-10-CM | POA: Diagnosis not present

## 2019-02-11 DIAGNOSIS — J449 Chronic obstructive pulmonary disease, unspecified: Secondary | ICD-10-CM | POA: Diagnosis present

## 2019-02-11 DIAGNOSIS — J9690 Respiratory failure, unspecified, unspecified whether with hypoxia or hypercapnia: Secondary | ICD-10-CM

## 2019-02-11 DIAGNOSIS — E46 Unspecified protein-calorie malnutrition: Secondary | ICD-10-CM | POA: Diagnosis not present

## 2019-02-11 DIAGNOSIS — E43 Unspecified severe protein-calorie malnutrition: Secondary | ICD-10-CM | POA: Diagnosis not present

## 2019-02-11 DIAGNOSIS — Z8673 Personal history of transient ischemic attack (TIA), and cerebral infarction without residual deficits: Secondary | ICD-10-CM | POA: Diagnosis not present

## 2019-02-11 DIAGNOSIS — M1990 Unspecified osteoarthritis, unspecified site: Secondary | ICD-10-CM | POA: Diagnosis present

## 2019-02-11 DIAGNOSIS — G9340 Encephalopathy, unspecified: Secondary | ICD-10-CM | POA: Diagnosis not present

## 2019-02-11 DIAGNOSIS — F10239 Alcohol dependence with withdrawal, unspecified: Secondary | ICD-10-CM | POA: Diagnosis not present

## 2019-02-11 DIAGNOSIS — R739 Hyperglycemia, unspecified: Secondary | ICD-10-CM

## 2019-02-11 DIAGNOSIS — R4182 Altered mental status, unspecified: Secondary | ICD-10-CM

## 2019-02-11 DIAGNOSIS — A4151 Sepsis due to Escherichia coli [E. coli]: Secondary | ICD-10-CM | POA: Diagnosis present

## 2019-02-11 DIAGNOSIS — D649 Anemia, unspecified: Secondary | ICD-10-CM | POA: Diagnosis not present

## 2019-02-11 DIAGNOSIS — E870 Hyperosmolality and hypernatremia: Secondary | ICD-10-CM | POA: Diagnosis not present

## 2019-02-11 DIAGNOSIS — E1140 Type 2 diabetes mellitus with diabetic neuropathy, unspecified: Secondary | ICD-10-CM | POA: Diagnosis present

## 2019-02-11 DIAGNOSIS — G9341 Metabolic encephalopathy: Secondary | ICD-10-CM | POA: Diagnosis present

## 2019-02-11 DIAGNOSIS — I10 Essential (primary) hypertension: Secondary | ICD-10-CM | POA: Diagnosis not present

## 2019-02-11 DIAGNOSIS — Z794 Long term (current) use of insulin: Secondary | ICD-10-CM | POA: Diagnosis not present

## 2019-02-11 DIAGNOSIS — Z515 Encounter for palliative care: Secondary | ICD-10-CM | POA: Diagnosis not present

## 2019-02-11 DIAGNOSIS — L89152 Pressure ulcer of sacral region, stage 2: Secondary | ICD-10-CM | POA: Diagnosis present

## 2019-02-11 DIAGNOSIS — E119 Type 2 diabetes mellitus without complications: Secondary | ICD-10-CM | POA: Diagnosis not present

## 2019-02-11 DIAGNOSIS — L8990 Pressure ulcer of unspecified site, unspecified stage: Secondary | ICD-10-CM | POA: Insufficient documentation

## 2019-02-11 DIAGNOSIS — Z9911 Dependence on respirator [ventilator] status: Secondary | ICD-10-CM

## 2019-02-11 DIAGNOSIS — R6521 Severe sepsis with septic shock: Secondary | ICD-10-CM | POA: Diagnosis present

## 2019-02-11 DIAGNOSIS — I110 Hypertensive heart disease with heart failure: Secondary | ICD-10-CM | POA: Diagnosis present

## 2019-02-11 DIAGNOSIS — Z0189 Encounter for other specified special examinations: Secondary | ICD-10-CM

## 2019-02-11 DIAGNOSIS — E876 Hypokalemia: Secondary | ICD-10-CM | POA: Diagnosis not present

## 2019-02-11 DIAGNOSIS — J690 Pneumonitis due to inhalation of food and vomit: Secondary | ICD-10-CM | POA: Diagnosis not present

## 2019-02-11 DIAGNOSIS — B9620 Unspecified Escherichia coli [E. coli] as the cause of diseases classified elsewhere: Secondary | ICD-10-CM | POA: Diagnosis not present

## 2019-02-11 DIAGNOSIS — N179 Acute kidney failure, unspecified: Secondary | ICD-10-CM | POA: Diagnosis not present

## 2019-02-11 DIAGNOSIS — J95851 Ventilator associated pneumonia: Secondary | ICD-10-CM | POA: Diagnosis not present

## 2019-02-11 DIAGNOSIS — R0603 Acute respiratory distress: Secondary | ICD-10-CM

## 2019-02-11 DIAGNOSIS — Z20828 Contact with and (suspected) exposure to other viral communicable diseases: Secondary | ICD-10-CM | POA: Diagnosis present

## 2019-02-11 LAB — COMPREHENSIVE METABOLIC PANEL
ALT: 41 U/L (ref 0–44)
AST: 80 U/L — ABNORMAL HIGH (ref 15–41)
Albumin: 2.7 g/dL — ABNORMAL LOW (ref 3.5–5.0)
Alkaline Phosphatase: 153 U/L — ABNORMAL HIGH (ref 38–126)
Anion gap: 28 — ABNORMAL HIGH (ref 5–15)
BUN: 52 mg/dL — ABNORMAL HIGH (ref 8–23)
CO2: 15 mmol/L — ABNORMAL LOW (ref 22–32)
Calcium: 9.6 mg/dL (ref 8.9–10.3)
Chloride: 99 mmol/L (ref 98–111)
Creatinine, Ser: 3.45 mg/dL — ABNORMAL HIGH (ref 0.61–1.24)
GFR calc Af Amer: 19 mL/min — ABNORMAL LOW (ref >60)
GFR calc non Af Amer: 16 mL/min — ABNORMAL LOW (ref >60)
Glucose, Bld: 781 mg/dL (ref 70–99)
Potassium: 3.7 mmol/L (ref 3.5–5.1)
Sodium: 142 mmol/L (ref 135–145)
Total Bilirubin: 2.4 mg/dL — ABNORMAL HIGH (ref 0.3–1.2)
Total Protein: 7.3 g/dL (ref 6.5–8.1)

## 2019-02-11 LAB — POCT I-STAT EG7
Acid-base deficit: 9 mmol/L — ABNORMAL HIGH (ref 0.0–2.0)
Bicarbonate: 18.2 mmol/L — ABNORMAL LOW (ref 20.0–28.0)
Calcium, Ion: 1.21 mmol/L (ref 1.15–1.40)
HCT: 51 % (ref 39.0–52.0)
Hemoglobin: 17.3 g/dL — ABNORMAL HIGH (ref 13.0–17.0)
O2 Saturation: 59 %
Potassium: 4.2 mmol/L (ref 3.5–5.1)
Sodium: 143 mmol/L (ref 135–145)
TCO2: 20 mmol/L — ABNORMAL LOW (ref 22–32)
pCO2, Ven: 43.3 mmHg — ABNORMAL LOW (ref 44.0–60.0)
pH, Ven: 7.231 — ABNORMAL LOW (ref 7.250–7.430)
pO2, Ven: 36 mmHg (ref 32.0–45.0)

## 2019-02-11 LAB — GLUCOSE, CAPILLARY
Glucose-Capillary: 305 mg/dL — ABNORMAL HIGH (ref 70–99)
Glucose-Capillary: 206 mg/dL — ABNORMAL HIGH (ref 70–99)
Glucose-Capillary: 147 mg/dL — ABNORMAL HIGH (ref 70–99)

## 2019-02-11 LAB — URINALYSIS, ROUTINE W REFLEX MICROSCOPIC
Bilirubin Urine: NEGATIVE
Glucose, UA: >=500 mg/dL — AB
Ketones, ur: 5 mg/dL — AB
Nitrite: NEGATIVE
Protein, ur: 100 mg/dL — AB
Specific Gravity, Urine: 1.017 (ref 1.005–1.030)
pH: 5 (ref 5.0–8.0)

## 2019-02-11 LAB — BASIC METABOLIC PANEL
Anion gap: 15 (ref 5–15)
Anion gap: 12 (ref 5–15)
BUN: 52 mg/dL — ABNORMAL HIGH (ref 8–23)
BUN: 51 mg/dL — ABNORMAL HIGH (ref 8–23)
CO2: 18 mmol/L — ABNORMAL LOW (ref 22–32)
CO2: 21 mmol/L — ABNORMAL LOW (ref 22–32)
Calcium: 8.4 mg/dL — ABNORMAL LOW (ref 8.9–10.3)
Calcium: 8.4 mg/dL — ABNORMAL LOW (ref 8.9–10.3)
Chloride: 111 mmol/L (ref 98–111)
Chloride: 113 mmol/L — ABNORMAL HIGH (ref 98–111)
Creatinine, Ser: 2.9 mg/dL — ABNORMAL HIGH (ref 0.61–1.24)
Creatinine, Ser: 2.8 mg/dL — ABNORMAL HIGH (ref 0.61–1.24)
GFR calc Af Amer: 23 mL/min — ABNORMAL LOW (ref >60)
GFR calc Af Amer: 24 mL/min — ABNORMAL LOW (ref >60)
GFR calc non Af Amer: 20 mL/min — ABNORMAL LOW (ref >60)
GFR calc non Af Amer: 21 mL/min — ABNORMAL LOW (ref >60)
Glucose, Bld: 502 mg/dL (ref 70–99)
Glucose, Bld: 367 mg/dL — ABNORMAL HIGH (ref 70–99)
Potassium: 3.6 mmol/L (ref 3.5–5.1)
Potassium: 3.4 mmol/L — ABNORMAL LOW (ref 3.5–5.1)
Sodium: 144 mmol/L (ref 135–145)
Sodium: 146 mmol/L — ABNORMAL HIGH (ref 135–145)

## 2019-02-11 LAB — CBC WITH DIFFERENTIAL/PLATELET
Abs Immature Granulocytes: 0.43 10*3/uL — ABNORMAL HIGH (ref 0.00–0.07)
Basophils Absolute: 0.2 10*3/uL — ABNORMAL HIGH (ref 0.0–0.1)
Basophils Relative: 1 %
Eosinophils Absolute: 0 10*3/uL (ref 0.0–0.5)
Eosinophils Relative: 0 %
HCT: 42 % (ref 39.0–52.0)
Hemoglobin: 13.3 g/dL (ref 13.0–17.0)
Immature Granulocytes: 2 %
Lymphocytes Relative: 6 %
Lymphs Abs: 1 10*3/uL (ref 0.7–4.0)
MCH: 29 pg (ref 26.0–34.0)
MCHC: 31.7 g/dL (ref 30.0–36.0)
MCV: 91.7 fL (ref 80.0–100.0)
Monocytes Absolute: 1.4 10*3/uL — ABNORMAL HIGH (ref 0.1–1.0)
Monocytes Relative: 8 %
Neutro Abs: 15 10*3/uL — ABNORMAL HIGH (ref 1.7–7.7)
Neutrophils Relative %: 83 %
Platelets: 172 10*3/uL (ref 150–400)
RBC: 4.58 MIL/uL (ref 4.22–5.81)
RDW: 13.8 % (ref 11.5–15.5)
WBC: 18 10*3/uL — ABNORMAL HIGH (ref 4.0–10.5)
nRBC: 0.4 % — ABNORMAL HIGH (ref 0.0–0.2)

## 2019-02-11 LAB — CBG MONITORING, ED
Glucose-Capillary: >600 mg/dL (ref 70–99)
Glucose-Capillary: 580 mg/dL (ref 70–99)
Glucose-Capillary: 550 mg/dL (ref 70–99)
Glucose-Capillary: 441 mg/dL — ABNORMAL HIGH (ref 70–99)
Glucose-Capillary: 440 mg/dL — ABNORMAL HIGH (ref 70–99)

## 2019-02-11 LAB — APTT: aPTT: 33 seconds (ref 24–36)

## 2019-02-11 LAB — LACTIC ACID, PLASMA
Lactic Acid, Venous: 5 mmol/L (ref 0.5–1.9)
Lactic Acid, Venous: 4.5 mmol/L (ref 0.5–1.9)
Lactic Acid, Venous: 4.3 mmol/L (ref 0.5–1.9)

## 2019-02-11 LAB — TROPONIN I (HIGH SENSITIVITY)
Troponin I (High Sensitivity): 121 ng/L (ref <18)
Troponin I (High Sensitivity): 164 ng/L (ref <18)

## 2019-02-11 LAB — PROTIME-INR
INR: 1.9 — ABNORMAL HIGH (ref 0.8–1.2)
Prothrombin Time: 21.5 seconds — ABNORMAL HIGH (ref 11.4–15.2)

## 2019-02-11 LAB — ETHANOL: Alcohol, Ethyl (B): <10 mg/dL (ref <10)

## 2019-02-11 LAB — CK
Total CK: 3041 U/L — ABNORMAL HIGH (ref 49–397)
Total CK: 3774 U/L — ABNORMAL HIGH (ref 49–397)
Total CK: 3837 U/L — ABNORMAL HIGH (ref 49–397)

## 2019-02-11 LAB — PHOSPHORUS: Phosphorus: 3.2 mg/dL (ref 2.5–4.6)

## 2019-02-11 LAB — SARS CORONAVIRUS 2 BY RT PCR (HOSPITAL ORDER, PERFORMED IN ~~LOC~~ HOSPITAL LAB): SARS Coronavirus 2: NEGATIVE

## 2019-02-11 LAB — BETA-HYDROXYBUTYRIC ACID: Beta-Hydroxybutyric Acid: 2.95 mmol/L — ABNORMAL HIGH (ref 0.05–0.27)

## 2019-02-11 LAB — MAGNESIUM: Magnesium: 2.4 mg/dL (ref 1.7–2.4)

## 2019-02-11 LAB — HEMOGLOBIN A1C
Hgb A1c MFr Bld: 13.7 % — ABNORMAL HIGH (ref 4.8–5.6)
Mean Plasma Glucose: 346.49 mg/dL

## 2019-02-11 MED ORDER — SODIUM CHLORIDE 0.9% FLUSH
3.0000 mL | Freq: Two times a day (BID) | INTRAVENOUS | Status: DC
  Administered 2019-02-11 – 2019-03-04 (×26): 3 mL via INTRAVENOUS

## 2019-02-11 MED ORDER — ENOXAPARIN SODIUM 30 MG/0.3ML ~~LOC~~ SOLN: 30.0000 mg | SUBCUTANEOUS | Status: DC

## 2019-02-11 MED ORDER — SODIUM CHLORIDE 0.9 % IV SOLN
INTRAVENOUS | Status: DC
  Administered 2019-02-11 (×2): via INTRAVENOUS

## 2019-02-11 MED ORDER — POTASSIUM CHLORIDE 10 MEQ/100ML IV SOLN
10.0000 meq | INTRAVENOUS | Status: AC
  Filled 2019-02-11: qty 100

## 2019-02-11 MED ORDER — INSULIN REGULAR(HUMAN) IN NACL 100-0.9 UT/100ML-% IV SOLN
INTRAVENOUS | Status: DC
  Administered 2019-02-11: 19:00:00 11.4 [IU]/h via INTRAVENOUS
  Administered 2019-02-12: 3.2 [IU]/h via INTRAVENOUS
  Filled 2019-02-11 (×3): qty 100

## 2019-02-11 MED ORDER — ACETAMINOPHEN 325 MG PO TABS: 650.0000 mg | ORAL_TABLET | Freq: Four times a day (QID) | ORAL | Status: DC | PRN

## 2019-02-11 MED ORDER — POTASSIUM CHLORIDE 10 MEQ/100ML IV SOLN
10.0000 meq | INTRAVENOUS | Status: AC
  Administered 2019-02-11 (×2): 10 meq via INTRAVENOUS
  Filled 2019-02-11: qty 100

## 2019-02-11 MED ORDER — VANCOMYCIN VARIABLE DOSE PER UNSTABLE RENAL FUNCTION (PHARMACIST DOSING): Status: DC

## 2019-02-11 MED ORDER — VANCOMYCIN HCL 10 G IV SOLR
1500.0000 mg | Freq: Once | INTRAVENOUS | Status: AC
  Administered 2019-02-11: 18:00:00 1500 mg via INTRAVENOUS
  Filled 2019-02-11: qty 1500

## 2019-02-11 MED ORDER — ENOXAPARIN SODIUM 30 MG/0.3ML ~~LOC~~ SOLN
30.0000 mg | SUBCUTANEOUS | Status: DC
  Administered 2019-02-11 – 2019-02-13 (×3): 30 mg via SUBCUTANEOUS
  Filled 2019-02-11 (×3): qty 0.3

## 2019-02-11 MED ORDER — SODIUM CHLORIDE 0.9 % IV BOLUS
1500.0000 mL | Freq: Once | INTRAVENOUS | Status: AC
  Administered 2019-02-11: 17:00:00 1500 mL via INTRAVENOUS

## 2019-02-11 MED ORDER — DEXTROSE-NACL 5-0.45 % IV SOLN
INTRAVENOUS | Status: DC
  Administered 2019-02-11 – 2019-02-12 (×3): via INTRAVENOUS

## 2019-02-11 MED ORDER — ACETAMINOPHEN 650 MG RE SUPP
650.0000 mg | Freq: Once | RECTAL | Status: AC
  Administered 2019-02-11: 650 mg via RECTAL
  Filled 2019-02-11: qty 1

## 2019-02-11 MED ORDER — SODIUM CHLORIDE 0.9 % IV SOLN
2.0000 g | Freq: Once | INTRAVENOUS | Status: AC
  Administered 2019-02-11: 2 g via INTRAVENOUS
  Filled 2019-02-11: qty 2

## 2019-02-11 MED ORDER — SODIUM CHLORIDE 0.9 % IV BOLUS
1000.0000 mL | Freq: Once | INTRAVENOUS | Status: AC
  Administered 2019-02-11: 1000 mL via INTRAVENOUS

## 2019-02-11 MED ORDER — ACETAMINOPHEN 650 MG RE SUPP
650.0000 mg | Freq: Four times a day (QID) | RECTAL | Status: DC | PRN
  Administered 2019-02-12 (×2): 650 mg via RECTAL
  Filled 2019-02-11 (×2): qty 1

## 2019-02-11 MED ORDER — INSULIN REGULAR(HUMAN) IN NACL 100-0.9 UT/100ML-% IV SOLN
INTRAVENOUS | Status: DC
  Administered 2019-02-11: 5.4 [IU]/h via INTRAVENOUS
  Filled 2019-02-11: qty 100

## 2019-02-11 MED ORDER — SODIUM CHLORIDE 0.9 % IV SOLN: 2.0000 g | INTRAVENOUS | Status: DC

## 2019-02-11 NOTE — Progress Notes (Signed)
Maurice Stone was seen at bedside. Reason for visit was elevation in his troponins (121-164). He was non arousable at bedside to verbal or physical stimuli. His lungs had slight wheezes in the upper lobes bilaterally. His bowel sounds were normal, with no sensitivity or guarding on palpation. No presence of edema in the lower extremities. He was tachycardic with no murmurs, rubs, or gallops. His pulses were intact in both the upper and lower extremities.   Assessment:   [name] is a patient with a PMHx of DM, stroke, HTN, CAD, GERD, COPD, who was admitted for AMS, DKA, AKI, rhabdomyolysis, and Sepsis of unknown origin.  - Will continue to monitor his troponins through the night.

## 2019-02-11 NOTE — Progress Notes (Addendum)
Pharmacy Antibiotic Note  Maurice Stone is a 78 y.o. male admitted on 01/31/2019 with sepsis.  Pharmacy has been consulted for vancomycin dosing.  Pt found down - febrile at 101.3, confused, WBC 18, LA 5, CK 3041, Scr 3.45 (last Scr on 6/23 was 1.26). Received cefepime x1 in ED.    Plan: Given elevated SCr, will order vancomycin 1500 mg IV once Will monitor renal fx - consider vancomycin 24 hours after dose Monitor cx results, clinical pic  Would dose cefepime at 2 gram every 24 hours based on current Scr   Height: 5\' 11"  (180.3 cm) Weight: 173 lb 11.6 oz (78.8 kg) IBW/kg (Calculated) : 75.3  Temp (24hrs), Avg:101.3 F (38.5 C), Min:101.3 F (38.5 C), Max:101.3 F (38.5 C)  Recent Labs  Lab 02/17/2019 1458 01/28/2019 1508  WBC 18.0*  --   CREATININE 3.45*  --   LATICACIDVEN  --  5.0*    Estimated Creatinine Clearance: 19.4 mL/min (A) (by C-G formula based on SCr of 3.45 mg/dL (H)).    Allergies  Allergen Reactions  . Metformin And Related Other (See Comments)    unspecified  . Other Other (See Comments)    Nonsteroidal anti-inflammatory - unspecified  . Lisinopril Cough    Antimicrobials this admission: Cefepime 7/19 x1 Vanc 7/19 >>   Dose adjustments this admission: N/A  Microbiology results: 7/19 BCx: sent 7/19 UCx: sent  7/19 COVID PCR: neg  Thank you for allowing pharmacy to be a part of this patient's care.  Antonietta Jewel, PharmD, [place] Clinical Pharmacist  Pager: [phone] Phone: [phone] 02/07/2019 5:09 PM
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
## 2019-02-11 NOTE — ED Triage Notes (Signed)
Pt brought in by [facility] from home for AMS and hyperglycemia. Pt found on the floor of his room by a friend, pt was last seen x3 days ago. Pt CBG read high with EMS, given 1L of NS. Pt hypotensive, responsive to pain. According to friend pt normally A+Ox4 and independent.

## 2019-02-11 NOTE — Progress Notes (Signed)
Pt admitted from ED with diagnosis of DKA on Insulin drip, pt non verbal but open eyes on contact, settled in bed with call light at bedside, glucose stabilizer reactivated, tele monitor put and verified on pt, was however reassured and will continue to monitor, safety concern addressed accordingly. [place]-Asidi, Pricella Gaugh Efe
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
## 2019-02-11 NOTE — Progress Notes (Signed)
   01/27/2019 2130  Provider Notification  Provider Name/Title Dr Gilford Rile  Date Provider Notified 01/26/2019  Time Provider Notified 2130  Notification Type Page  Notification Reason Other (Comment) (Lactic acid 4.3)  Response No new orders

## 2019-02-11 NOTE — ED Provider Notes (Signed)
Physical Exam  BP 100/70   Pulse (!) 116   Temp (!) 101.3 F (38.5 C) (Rectal)   Resp 14   Ht 5\' 11"  (1.803 m)   Wt 78.8 kg   SpO2 93%   BMI 24.23 kg/m   Physical Exam  ED Course/Procedures     .Critical Care Performed by: Jola Schmidt, MD Authorized by: Jola Schmidt, MD   Critical care provider statement:    Critical care time (minutes):  45   Critical care was time spent personally by me on the following activities:  Discussions with consultants, evaluation of patient's response to treatment, examination of patient, ordering and performing treatments and interventions, ordering and review of laboratory studies, ordering and review of radiographic studies, pulse oximetry, re-evaluation of patient's condition, obtaining history from patient or surrogate and review of old charts   I assumed direction of critical care for this patient from another provider in my specialty: yes      MDM  Vancomycin and cefepime given.  Patient with large volume resuscitation in the emergency department with improving blood pressure.  Hypertensive on arrival.  Insulin drip for profound hyperglycemia and acidosis consistent with diabetic ketoacidosis/hyperosmolar hyper glycemia.  Attempted to contact patient's family member but unable to get a hold of family member.  CODE STATUS is not known.  He remains altered likely secondary from his severe sepsis and infection.  Blood pressure improving and therefore I think he can be admitted to the stepdown unit.  Coronavirus test pending at this time.  Febrile and altered.  CT imaging of the head without pathology.  We will continue monitor the patient closely while in the emergency department.  At this time he does not need IV pressors.   EKG Interpretation  Date/Time:  Sunday February 11 2019 14:51:22 EDT Ventricular Rate:  118 PR Interval:    QRS Duration: 102 QT Interval:  368 QTC Calculation: 516 R Axis:   -53 Text Interpretation:  Sinus tachycardia  Probable left atrial enlargement Left anterior fascicular block Abnormal R-wave progression, late transition Nonspecific T abnormalities, lateral leads ST elevation, consider inferior injury Prolonged QT interval Confirmed by Gerlene Fee [phone]) on 01/29/2019 3:06:01 PM      Ct Head Wo Contrast  Result Date: 02/14/2019 CLINICAL DATA:  78 year old who was found on the floor of his room by a friend, with acute mental status changes and hyperglycemia. EXAM: CT HEAD WITHOUT CONTRAST TECHNIQUE: Contiguous axial images were obtained from the base of the skull through the vertex without intravenous contrast. COMPARISON:  01/16/2019 and earlier, including MRI brain 12/10/2016 and earlier. FINDINGS: Brain: Moderate cortical and deep atrophy, unchanged. Mild changes of small vessel disease of the white matter diffusely. Remote lacunar strokes in the LEFT basal ganglia, unchanged. No mass lesion. No midline shift. No acute hemorrhage or hematoma. No extra-axial fluid collections. No evidence of acute infarction. Vascular: Severe BILATERAL carotid siphon and vertebral artery atherosclerosis. No hyperdense vessel. Skull: No skull fracture or other focal osseous abnormality involving the skull. Sinuses/Orbits: Visualized paranasal sinuses, bilateral mastoid air cells and bilateral middle ear cavities well-aerated. Visualized orbits and globes normal in appearance. Other: None. IMPRESSION: 1. No acute intracranial abnormality. 2. Stable moderate generalized atrophy and mild chronic microvascular ischemic changes of the white matter. 3. Remote lacunar strokes in the LEFT basal ganglia. Electronically Signed   By: Evangeline Dakin M.D.   On: 02/01/2019 16:22   Ct Head Wo Contrast  Result Date: 01/16/2019 CLINICAL DATA:  78 year old male with  altered mental status. EXAM: CT HEAD WITHOUT CONTRAST TECHNIQUE: Contiguous axial images were obtained from the base of the skull through the vertex without intravenous contrast.  COMPARISON:  Brain MRI dated 12/10/2016 and head CT dated 12/10/2016 FINDINGS: Brain: There is mild age-related atrophy and chronic microvascular ischemic changes. Stable old left basal ganglia lacunar infarct again noted. There is no acute intracranial hemorrhage. No mass effect or midline shift. No extra-axial fluid collection. Vascular: No hyperdense vessel or unexpected calcification. Skull: Normal. Negative for fracture or focal lesion. Sinuses/Orbits: No acute finding. Other: None IMPRESSION: 1. No acute intracranial hemorrhage. 2. Mild age-related atrophy and chronic microvascular ischemic changes. Stable old left basal ganglia lacunar infarct. Electronically Signed   By: Anner Crete M.D.   On: 01/16/2019 21:18   Dg Chest Port 1 View  Result Date: 02/13/2019 CLINICAL DATA:  Sepsis. EXAM: PORTABLE CHEST 1 VIEW COMPARISON:  Radiographs of May 01, 2017. FINDINGS: Stable cardiomediastinal silhouette. No pneumothorax or pleural effusion is noted. Both lungs are clear. The visualized skeletal structures are unremarkable. IMPRESSION: No active disease. Electronically Signed   By: Marijo Conception M.D.   On: 02/02/2019 15:28   I personally reviewed the imaging tests through PACS system I reviewed available ER/hospitalization records through the EMR        Jola Schmidt, MD 01/26/2019 1726

## 2019-02-11 NOTE — ED Notes (Signed)
Attempted to call report x 1  

## 2019-02-12 DIAGNOSIS — R6521 Severe sepsis with septic shock: Secondary | ICD-10-CM

## 2019-02-12 DIAGNOSIS — N179 Acute kidney failure, unspecified: Secondary | ICD-10-CM

## 2019-02-12 DIAGNOSIS — G9340 Encephalopathy, unspecified: Secondary | ICD-10-CM

## 2019-02-12 DIAGNOSIS — A419 Sepsis, unspecified organism: Secondary | ICD-10-CM

## 2019-02-12 DIAGNOSIS — E0811 Diabetes mellitus due to underlying condition with ketoacidosis with coma: Secondary | ICD-10-CM

## 2019-02-12 LAB — BLOOD CULTURE ID PANEL (REFLEXED)

## 2019-02-12 LAB — GLUCOSE, CAPILLARY
Glucose-Capillary: 127 mg/dL — ABNORMAL HIGH (ref 70–99)
Glucose-Capillary: 139 mg/dL — ABNORMAL HIGH (ref 70–99)
Glucose-Capillary: 152 mg/dL — ABNORMAL HIGH (ref 70–99)
Glucose-Capillary: 152 mg/dL — ABNORMAL HIGH (ref 70–99)
Glucose-Capillary: 153 mg/dL — ABNORMAL HIGH (ref 70–99)
Glucose-Capillary: 154 mg/dL — ABNORMAL HIGH (ref 70–99)
Glucose-Capillary: 168 mg/dL — ABNORMAL HIGH (ref 70–99)
Glucose-Capillary: 169 mg/dL — ABNORMAL HIGH (ref 70–99)
Glucose-Capillary: 172 mg/dL — ABNORMAL HIGH (ref 70–99)
Glucose-Capillary: 173 mg/dL — ABNORMAL HIGH (ref 70–99)
Glucose-Capillary: 174 mg/dL — ABNORMAL HIGH (ref 70–99)
Glucose-Capillary: 177 mg/dL — ABNORMAL HIGH (ref 70–99)
Glucose-Capillary: 186 mg/dL — ABNORMAL HIGH (ref 70–99)
Glucose-Capillary: 188 mg/dL — ABNORMAL HIGH (ref 70–99)
Glucose-Capillary: 190 mg/dL — ABNORMAL HIGH (ref 70–99)
Glucose-Capillary: 191 mg/dL — ABNORMAL HIGH (ref 70–99)
Glucose-Capillary: 215 mg/dL — ABNORMAL HIGH (ref 70–99)
Glucose-Capillary: 220 mg/dL — ABNORMAL HIGH (ref 70–99)

## 2019-02-12 LAB — BASIC METABOLIC PANEL
Anion gap: 9 (ref 5–15)
Anion gap: 10 (ref 5–15)
Anion gap: 12 (ref 5–15)
Anion gap: 10 (ref 5–15)
Anion gap: 8 (ref 5–15)
BUN: 52 mg/dL — ABNORMAL HIGH (ref 8–23)
BUN: 53 mg/dL — ABNORMAL HIGH (ref 8–23)
BUN: 49 mg/dL — ABNORMAL HIGH (ref 8–23)
BUN: 50 mg/dL — ABNORMAL HIGH (ref 8–23)
BUN: 48 mg/dL — ABNORMAL HIGH (ref 8–23)
CO2: 21 mmol/L — ABNORMAL LOW (ref 22–32)
CO2: 21 mmol/L — ABNORMAL LOW (ref 22–32)
CO2: 19 mmol/L — ABNORMAL LOW (ref 22–32)
CO2: 19 mmol/L — ABNORMAL LOW (ref 22–32)
CO2: 20 mmol/L — ABNORMAL LOW (ref 22–32)
Calcium: 8.3 mg/dL — ABNORMAL LOW (ref 8.9–10.3)
Calcium: 8.4 mg/dL — ABNORMAL LOW (ref 8.9–10.3)
Calcium: 8.5 mg/dL — ABNORMAL LOW (ref 8.9–10.3)
Calcium: 8 mg/dL — ABNORMAL LOW (ref 8.9–10.3)
Calcium: 8.3 mg/dL — ABNORMAL LOW (ref 8.9–10.3)
Chloride: 119 mmol/L — ABNORMAL HIGH (ref 98–111)
Chloride: 120 mmol/L — ABNORMAL HIGH (ref 98–111)
Chloride: 121 mmol/L — ABNORMAL HIGH (ref 98–111)
Chloride: 120 mmol/L — ABNORMAL HIGH (ref 98–111)
Chloride: 124 mmol/L — ABNORMAL HIGH (ref 98–111)
Creatinine, Ser: 2.51 mg/dL — ABNORMAL HIGH (ref 0.61–1.24)
Creatinine, Ser: 2.68 mg/dL — ABNORMAL HIGH (ref 0.61–1.24)
Creatinine, Ser: 2.58 mg/dL — ABNORMAL HIGH (ref 0.61–1.24)
Creatinine, Ser: 2.52 mg/dL — ABNORMAL HIGH (ref 0.61–1.24)
Creatinine, Ser: 2.36 mg/dL — ABNORMAL HIGH (ref 0.61–1.24)
GFR calc Af Amer: 28 mL/min — ABNORMAL LOW (ref >60)
GFR calc Af Amer: 26 mL/min — ABNORMAL LOW (ref >60)
GFR calc Af Amer: 27 mL/min — ABNORMAL LOW (ref >60)
GFR calc Af Amer: 28 mL/min — ABNORMAL LOW (ref >60)
GFR calc Af Amer: 30 mL/min — ABNORMAL LOW (ref >60)
GFR calc non Af Amer: 24 mL/min — ABNORMAL LOW (ref >60)
GFR calc non Af Amer: 22 mL/min — ABNORMAL LOW (ref >60)
GFR calc non Af Amer: 23 mL/min — ABNORMAL LOW (ref >60)
GFR calc non Af Amer: 24 mL/min — ABNORMAL LOW (ref >60)
GFR calc non Af Amer: 26 mL/min — ABNORMAL LOW (ref >60)
Glucose, Bld: 206 mg/dL — ABNORMAL HIGH (ref 70–99)
Glucose, Bld: 175 mg/dL — ABNORMAL HIGH (ref 70–99)
Glucose, Bld: 139 mg/dL — ABNORMAL HIGH (ref 70–99)
Glucose, Bld: 267 mg/dL — ABNORMAL HIGH (ref 70–99)
Glucose, Bld: 141 mg/dL — ABNORMAL HIGH (ref 70–99)
Potassium: 3.6 mmol/L (ref 3.5–5.1)
Potassium: 3.7 mmol/L (ref 3.5–5.1)
Potassium: 3.7 mmol/L (ref 3.5–5.1)
Potassium: 3.2 mmol/L — ABNORMAL LOW (ref 3.5–5.1)
Potassium: 3.6 mmol/L (ref 3.5–5.1)
Sodium: 149 mmol/L — ABNORMAL HIGH (ref 135–145)
Sodium: 151 mmol/L — ABNORMAL HIGH (ref 135–145)
Sodium: 152 mmol/L — ABNORMAL HIGH (ref 135–145)
Sodium: 149 mmol/L — ABNORMAL HIGH (ref 135–145)
Sodium: 152 mmol/L — ABNORMAL HIGH (ref 135–145)

## 2019-02-12 LAB — BLOOD GAS, ARTERIAL
Acid-base deficit: 1.6 mmol/L (ref 0.0–2.0)
Bicarbonate: 22.1 mmol/L (ref 20.0–28.0)
Drawn by: 358491
FIO2: 28
O2 Saturation: 92.1 %
Patient temperature: 103.6
pCO2 arterial: 38.3 mmHg (ref 32.0–48.0)
pH, Arterial: 7.393 (ref 7.350–7.450)
pO2, Arterial: 71.6 mmHg — ABNORMAL LOW (ref 83.0–108.0)

## 2019-02-12 LAB — LACTIC ACID, PLASMA
Lactic Acid, Venous: 3 mmol/L (ref 0.5–1.9)
Lactic Acid, Venous: 3.7 mmol/L (ref 0.5–1.9)
Lactic Acid, Venous: 3.8 mmol/L (ref 0.5–1.9)
Lactic Acid, Venous: 2.6 mmol/L (ref 0.5–1.9)

## 2019-02-12 LAB — CBC
HCT: 37.4 % — ABNORMAL LOW (ref 39.0–52.0)
Hemoglobin: 12.2 g/dL — ABNORMAL LOW (ref 13.0–17.0)
MCH: 29 pg (ref 26.0–34.0)
MCHC: 32.6 g/dL (ref 30.0–36.0)
MCV: 88.8 fL (ref 80.0–100.0)
Platelets: 135 10*3/uL — ABNORMAL LOW (ref 150–400)
RBC: 4.21 MIL/uL — ABNORMAL LOW (ref 4.22–5.81)
RDW: 14 % (ref 11.5–15.5)
WBC: 14.3 10*3/uL — ABNORMAL HIGH (ref 4.0–10.5)
nRBC: 0.7 % — ABNORMAL HIGH (ref 0.0–0.2)

## 2019-02-12 LAB — CK
Total CK: 3312 U/L — ABNORMAL HIGH (ref 49–397)
Total CK: 3368 U/L — ABNORMAL HIGH (ref 49–397)
Total CK: 3272 U/L — ABNORMAL HIGH (ref 49–397)
Total CK: 2931 U/L — ABNORMAL HIGH (ref 49–397)

## 2019-02-12 LAB — BETA-HYDROXYBUTYRIC ACID: Beta-Hydroxybutyric Acid: 0.17 mmol/L (ref 0.05–0.27)

## 2019-02-12 LAB — TROPONIN I (HIGH SENSITIVITY): Troponin I (High Sensitivity): 208 ng/L (ref <18)

## 2019-02-12 LAB — MRSA PCR SCREENING: MRSA by PCR: NEGATIVE

## 2019-02-12 MED ORDER — DEXTROSE 50 % IV SOLN
INTRAVENOUS | Status: AC
  Administered 2019-02-12: 24 mL
  Filled 2019-02-12: qty 50

## 2019-02-12 MED ORDER — METRONIDAZOLE IN NACL 5-0.79 MG/ML-% IV SOLN
500.0000 mg | Freq: Three times a day (TID) | INTRAVENOUS | Status: DC
  Administered 2019-02-12: 14:00:00 500 mg via INTRAVENOUS
  Filled 2019-02-12: qty 100

## 2019-02-12 MED ORDER — LACTATED RINGERS IV BOLUS: 1000.0000 mL | Freq: Once | INTRAVENOUS | Status: DC

## 2019-02-12 MED ORDER — ACETAMINOPHEN 10 MG/ML IV SOLN
1000.0000 mg | Freq: Four times a day (QID) | INTRAVENOUS | Status: AC
  Administered 2019-02-12 – 2019-02-13 (×4): 1000 mg via INTRAVENOUS
  Filled 2019-02-12 (×4): qty 100

## 2019-02-12 MED ORDER — PANTOPRAZOLE SODIUM 40 MG IV SOLR
40.0000 mg | INTRAVENOUS | Status: DC
  Administered 2019-02-12 – 2019-02-16 (×5): 40 mg via INTRAVENOUS
  Filled 2019-02-12 (×6): qty 40

## 2019-02-12 MED ORDER — SODIUM CHLORIDE 0.9 % IV SOLN
2.0000 g | INTRAVENOUS | Status: DC
  Administered 2019-02-12 – 2019-02-13 (×2): 2 g via INTRAVENOUS
  Filled 2019-02-12 (×2): qty 2

## 2019-02-12 MED ORDER — SODIUM CHLORIDE 0.9 % IV SOLN
2.0000 g | Freq: Every day | INTRAVENOUS | Status: DC
  Administered 2019-02-12: 2 g via INTRAVENOUS
  Filled 2019-02-12: qty 2

## 2019-02-12 MED ORDER — INSULIN ASPART 100 UNIT/ML ~~LOC~~ SOLN
3.0000 [IU] | SUBCUTANEOUS | Status: DC
  Administered 2019-02-12: 3 [IU] via SUBCUTANEOUS
  Administered 2019-02-12: 6 [IU] via SUBCUTANEOUS
  Administered 2019-02-13: 3 [IU] via SUBCUTANEOUS
  Administered 2019-02-13: 17:00:00 6 [IU] via SUBCUTANEOUS
  Administered 2019-02-13 – 2019-02-14 (×4): 3 [IU] via SUBCUTANEOUS
  Administered 2019-02-14: 6 [IU] via SUBCUTANEOUS
  Administered 2019-02-14: 3 [IU] via SUBCUTANEOUS
  Administered 2019-02-14: 6 [IU] via SUBCUTANEOUS

## 2019-02-12 MED ORDER — INSULIN DETEMIR 100 UNIT/ML ~~LOC~~ SOLN
15.0000 [IU] | Freq: Two times a day (BID) | SUBCUTANEOUS | Status: DC
  Administered 2019-02-12 – 2019-02-14 (×5): 15 [IU] via SUBCUTANEOUS
  Filled 2019-02-12 (×6): qty 0.15

## 2019-02-12 MED ORDER — LACTATED RINGERS IV SOLN
INTRAVENOUS | Status: DC
  Administered 2019-02-12 – 2019-02-23 (×13): via INTRAVENOUS

## 2019-02-12 MED ORDER — POTASSIUM CHLORIDE 10 MEQ/100ML IV SOLN
10.0000 meq | INTRAVENOUS | Status: AC
  Administered 2019-02-12 (×3): 10 meq via INTRAVENOUS
  Filled 2019-02-12 (×3): qty 100

## 2019-02-12 NOTE — Significant Event (Signed)
Rapid Response Event Note  I came by to see [name] as a follow-up, repeat temp 103.4 (R), + rigors/chills, HR 138, RR 34-38, BP 121/70 (85), 93% on 2L [place]. IMTS MDs updated. Blood Sugar 41.  Interventions: -- 24 cc D50 per Glucostabilizer, repeat BS 191 -- Loss of PIV - attempted x 2, VAST RN came to restart PIV. MIVF restarted and APAP IV given,  Start  Time 1040 End Time [place]  Maurice Stone R

## 2019-02-12 NOTE — Progress Notes (Signed)
PHARMACY - PHYSICIAN COMMUNICATION CRITICAL VALUE ALERT - BLOOD CULTURE IDENTIFICATION (BCID)  Maurice Stone is an 78 y.o. male who presented to [facility] on 02/23/2019 with a chief complaint of AMS/sepsis  Assessment: 2/2 blood cultures growing E. Coli  Name of physician (or Provider) Contacted: Dr. Sheppard Coil via Secure Chat  Current antibiotics: Vancomycin and Cefepime   Changes to prescribed antibiotics recommended:   Recommend narrowing antibiotics to Rocephin 2 g IV q24h  Results for orders placed or performed during the hospital encounter of 02/07/2019  Blood Culture ID Panel (Reflexed) (Collected: 02/04/2019  3:02 PM)  Result Value Ref Range   Enterococcus species NOT DETECTED NOT DETECTED   Listeria monocytogenes NOT DETECTED NOT DETECTED   Staphylococcus species NOT DETECTED NOT DETECTED   Staphylococcus aureus (BCID) NOT DETECTED NOT DETECTED   Streptococcus species NOT DETECTED NOT DETECTED   Streptococcus agalactiae NOT DETECTED NOT DETECTED   Streptococcus pneumoniae NOT DETECTED NOT DETECTED   Streptococcus pyogenes NOT DETECTED NOT DETECTED   Acinetobacter baumannii NOT DETECTED NOT DETECTED   Enterobacteriaceae species DETECTED (A) NOT DETECTED   Enterobacter cloacae complex NOT DETECTED NOT DETECTED   Escherichia coli DETECTED (A) NOT DETECTED   Klebsiella oxytoca NOT DETECTED NOT DETECTED   Klebsiella pneumoniae NOT DETECTED NOT DETECTED   Proteus species NOT DETECTED NOT DETECTED   Serratia marcescens NOT DETECTED NOT DETECTED   Carbapenem resistance NOT DETECTED NOT DETECTED   Haemophilus influenzae NOT DETECTED NOT DETECTED   Neisseria meningitidis NOT DETECTED NOT DETECTED   Pseudomonas aeruginosa NOT DETECTED NOT DETECTED   Candida albicans NOT DETECTED NOT DETECTED   Candida glabrata NOT DETECTED NOT DETECTED   Candida krusei NOT DETECTED NOT DETECTED   Candida parapsilosis NOT DETECTED NOT DETECTED   Candida tropicalis NOT DETECTED NOT DETECTED     Caryl Pina 02/12/2019  5:06 AM

## 2019-02-12 NOTE — Progress Notes (Signed)
   02/12/19 1518  Provider Notification  Provider Name/Title Dr Gilford Rile  Date Provider Notified 02/12/19  Time Provider Notified [phone]  Notification Type Page  Notification Reason Other (Comment) (Pt's HR 130s)  Response No new orders  Date of Provider Response 02/12/19  Time of Provider Response [phone]

## 2019-02-12 NOTE — Significant Event (Addendum)
Rapid Response Event Note  I have been at the bedside since about 1045. Around 1215, BP has decreased to SBP 70-80s with MAP lows 60s. Per [facility] patient has received 3.5L NS via bolus (not including MIVF).   Patient appears more restless now at times but overall mental status has not changed. HR 120s, temp 101.8 rectal (on cooling blanket).   IMTS MD paged and updated.   Plan: -- Will consult PCCM   PCCM MD came to bedside 1345, D51/2NS decreased to 75 cc/hr per Dr. Elsworth Soho. Patient will transfer to ICU. Patient transferred to 2M09 at 1600.  End Time 1625
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
## 2019-02-12 NOTE — Progress Notes (Addendum)
Subjective: Patient is not alert and appears uncomfortable. Turns head, but does not respond verbally to noxious stimuli. Patient rolled during exam to look for wounds. Dark nonblanchable lesions were appreciated on the sacral region and upper back, however there was no skin breakdown.   Objective:  Vital signs in last 24 hours: Vitals:   02/12/19 0343 02/12/19 0542 02/12/19 0713 02/12/19 0758  BP: 102/62 108/61 108/69 106/67  Pulse:  (!) 132 (!) 135 (!) 132  Resp:  (!) 28 (!) 31 (!) 27  Temp: (!) 103.1 F (39.5 C)   (!) 103.1 F (39.5 C)  TempSrc: Oral   Axillary  SpO2:  95% 96% 96%  Weight:      Height:       Physical Exam Vitals signs reviewed.  Constitutional:      General: He is in acute distress.     Appearance: He is normal weight. He is ill-appearing and toxic-appearing.  HENT:     Head: Normocephalic and atraumatic.     Mouth/Throat:     Mouth: Mucous membranes are dry.     Comments: Dried vomit specs in beard Eyes:     General: No scleral icterus.       Right eye: No discharge.        Left eye: No discharge.     Pupils: Pupils are equal, round, and reactive to light.     Comments: Bilateral pupils sluggish   Cardiovascular:     Rate and Rhythm: Regular rhythm. Tachycardia present.     Pulses: Normal pulses.     Heart sounds: Normal heart sounds. No murmur. No friction rub. No gallop.   Pulmonary:     Effort: Pulmonary effort is normal. No respiratory distress.     Breath sounds: Normal breath sounds. No wheezing or rales.     Comments: Tachypnea   Abdominal:     General: Abdomen is flat. Bowel sounds are normal. There is no distension.     Palpations: Abdomen is soft.     Tenderness: There is no abdominal tenderness. There is no guarding.  Musculoskeletal:        General: No swelling or deformity.     Right lower leg: No edema.  Skin:    Findings: Bruising (sacrum and upper back, no skin breakdown ) present.     Comments: Dark pressure lesions on the  sacrum and upper back - likley from being down for several days   Cool extremities at finger tips and toes  Neurological:     Mental Status: He is disoriented.     Cranial Nerves: Cranial nerve deficit (unable to access due to AMS, but gag reflex and cough reflex intact) present.     Comments: L sided stiffness   Psychiatric:     Comments: Unable to access due to AMS    Assessment/Plan:  Active Problems:   DKA (diabetic ketoacidosis) ([place])  In summary Maurice Stone is a 78 year old gentleman with a history ofmale Jehovah's Witness with a history of stroke in 2002 and 2015 with R sided deficits, CAD, COPD, dementia, HTN, T2DM and GERD who presented to the ED after being found down at his residence for an unknown amount of time. It appears that he has several conditions going on at the same time contributing to AMS, including sepsis, DKA, and rhabdomyolysis. Troponins were elevated (201.007,121) but no ischemic changes on EKG.  Likely due to demand ischemia. We will plan to trend labs every 4 hours.  #Altered  Mental Status #DKA  #Sepsis of Unknown Source: The patient initially presented with a fever of 101.3, tachycardia and hypotension and notable labs significant for leukocytosis, severe hyperglycemia (800s), and large anion gap of 28 (now normalized) concerning for picture of sepsis. He was started on broad-spectrum antibiotics in the ED and given nearly 9 L of fluid over the first 24 hrs. Vital signs have not improved much since that time. His last set of vitals shows that he has a fever to 103.4, tachycardia to 138 and RR of 25. Blood cultures have returned positive for E. Coli. He has had E. Coli UTIs in the past that were all sensitive to Ceftriaxone.  The patient is currently still protecting his airway, however we will have followed a low threshold to transfer him to ICU setting if needed. - d/c vancomycin and cefepime. Start ceftriaxone today per pharmacy recs. - Cooling blankets in effort  to reduce fever - Start IV Tylenol 1000 mg every 6 hours in effort to reduce fever - ABG was obtained today, and was unremarkable. - Continue D5-.5N 200 mL/hr infusion - Continue NS 300 mL/hr infusion - Continue insulin drip - Serial BMPs every 4 hours - Trend lactate every 4 hours - Trend troponins - Cardiac monitoring  #Rhabdomyolysis: Patient presented with a CK of 3041. Peaked at 3837, now 3312. he was likely down at his residence for an extended period of time.  It is unknown how long he was down for. This is likely contributing to his elevated creatinine. Should be able to tolerate higher rate if needed, Last Echo in 2018 w/ normal EF and G1DD, not on home diuretic per our records. - Continue IV fluids as mentioned above - Trend CK and Cr   #AKI: Baseline creatinine 1.0. Patient presented with a creatinine of 3.45, now 2.68. Renal injury likley multifactorial, secondary to sepsis, DKA and rhabdomyolysis. - Continue IV fluids as mentioned above - Trend CK and Cr  #DVT prophylaxis - Lovenox 30 mg subq daily   #FEN/GI: -NPO  #Code Status: Full. Daughter driving up from [place] to see him. Getting clearance for visit.   Maurice Plater, MD Internal Medicine, PGY1 Pager: [phone]  02/12/2019,11:33 AM

## 2019-02-12 NOTE — Progress Notes (Signed)
Patient being transferred to 2M09. Report given to nurse taking care of patient.

## 2019-02-12 NOTE — Significant Event (Addendum)
Rapid Response Event Note  Overview: MEWS 9 - Sepsis   Initial Focused Assessment: Received a call from CRN with concerns of patient's breathing and overall presentation. Upon arrival, patient was somnolent, very hard to arouse, would respond to painful stimuli, otherwise was did not respond. RR 34-38, + use of accessory muscles, I lowered the face mask, noticed brown emesis on his beard, question if patient vomited. HR 136, temp 103.6 R, BP 112/69 (82), RR 36, 95% on 2L [place]. Weak Gag/Cough. On insulin drip for DKA. Lungs: clear in all fields, good air movement.   I am concerned that patient's is not protecting this airway. GCS 8.  LA 4.3>3.0>3.7  Interventions: -- STAT ABG 7.39/38/71.6/22  -- Ice packs for fever management  Plan of Care: -- I paged IMTS MD, updated MD, will obtain STAT ABG -- IMTS MD came to the bedside. Will obtain ABG and recheck VS in an hour. If no improvement, will need PCCM consult.   Event Summary:  Call Time 0843 Arrival Time 0845 End Time: 4656  Eleuterio Dollar R

## 2019-02-12 NOTE — Progress Notes (Signed)
CRITICAL VALUE ALERT  Critical Value:  Lactic 2.6  Date & Time Notied:  02/12/2019 @ 7564  Provider Notified: No, value is less than previously reported critical values  Orders Received/Actions taken:

## 2019-02-13 ENCOUNTER — Inpatient Hospital Stay (HOSPITAL_COMMUNITY): Payer: Medicare Other

## 2019-02-13 LAB — GLUCOSE, CAPILLARY
Glucose-Capillary: 118 mg/dL — ABNORMAL HIGH (ref 70–99)
Glucose-Capillary: 123 mg/dL — ABNORMAL HIGH (ref 70–99)
Glucose-Capillary: 129 mg/dL — ABNORMAL HIGH (ref 70–99)
Glucose-Capillary: 153 mg/dL — ABNORMAL HIGH (ref 70–99)
Glucose-Capillary: 160 mg/dL — ABNORMAL HIGH (ref 70–99)
Glucose-Capillary: 41 mg/dL — CL (ref 70–99)
Glucose-Capillary: 93 mg/dL (ref 70–99)

## 2019-02-13 LAB — CBC
HCT: 33.8 % — ABNORMAL LOW (ref 39.0–52.0)
Hemoglobin: 11.3 g/dL — ABNORMAL LOW (ref 13.0–17.0)
MCH: 29 pg (ref 26.0–34.0)
MCHC: 33.4 g/dL (ref 30.0–36.0)
MCV: 86.9 fL (ref 80.0–100.0)
Platelets: 105 10*3/uL — ABNORMAL LOW (ref 150–400)
RBC: 3.89 MIL/uL — ABNORMAL LOW (ref 4.22–5.81)
RDW: 14.4 % (ref 11.5–15.5)
WBC: 8.3 10*3/uL (ref 4.0–10.5)
nRBC: 1.2 % — ABNORMAL HIGH (ref 0.0–0.2)

## 2019-02-13 LAB — BASIC METABOLIC PANEL
Anion gap: 8 (ref 5–15)
BUN: 48 mg/dL — ABNORMAL HIGH (ref 8–23)
CO2: 20 mmol/L — ABNORMAL LOW (ref 22–32)
Calcium: 8.4 mg/dL — ABNORMAL LOW (ref 8.9–10.3)
Chloride: 126 mmol/L — ABNORMAL HIGH (ref 98–111)
Creatinine, Ser: 2.22 mg/dL — ABNORMAL HIGH (ref 0.61–1.24)
GFR calc Af Amer: 32 mL/min — ABNORMAL LOW (ref >60)
GFR calc non Af Amer: 28 mL/min — ABNORMAL LOW (ref >60)
Glucose, Bld: 137 mg/dL — ABNORMAL HIGH (ref 70–99)
Potassium: 3.2 mmol/L — ABNORMAL LOW (ref 3.5–5.1)
Sodium: 154 mmol/L — ABNORMAL HIGH (ref 135–145)

## 2019-02-13 LAB — CK: Total CK: 2011 U/L — ABNORMAL HIGH (ref 49–397)

## 2019-02-13 LAB — MAGNESIUM: Magnesium: 2 mg/dL (ref 1.7–2.4)

## 2019-02-13 LAB — PHOSPHORUS: Phosphorus: 1.3 mg/dL — ABNORMAL LOW (ref 2.5–4.6)

## 2019-02-13 MED ORDER — JEVITY 1.2 CAL PO LIQD
1000.0000 mL | ORAL | Status: DC
  Administered 2019-02-13 – 2019-02-14 (×2): 1000 mL
  Filled 2019-02-13 (×2): qty 1000

## 2019-02-13 MED ORDER — CHLORHEXIDINE GLUCONATE CLOTH 2 % EX PADS
6.0000 | MEDICATED_PAD | Freq: Every day | CUTANEOUS | Status: DC
  Administered 2019-02-13 – 2019-03-04 (×19): 6 via TOPICAL

## 2019-02-13 MED ORDER — SERTRALINE HCL 100 MG PO TABS
100.0000 mg | ORAL_TABLET | Freq: Every day | ORAL | Status: DC
  Administered 2019-02-13 – 2019-02-17 (×5): 100 mg
  Filled 2019-02-13 (×5): qty 1

## 2019-02-13 MED ORDER — GABAPENTIN 300 MG/6ML PO SOLN
100.0000 mg | Freq: Three times a day (TID) | ORAL | Status: DC
  Administered 2019-02-13 – 2019-02-16 (×9): 100 mg via ORAL
  Filled 2019-02-13 (×13): qty 2

## 2019-02-13 MED ORDER — DONEPEZIL HCL 10 MG PO TABS
10.0000 mg | ORAL_TABLET | Freq: Every day | ORAL | Status: DC
  Administered 2019-02-13 – 2019-02-15 (×3): 10 mg
  Filled 2019-02-13 (×4): qty 1

## 2019-02-13 MED ORDER — CLOPIDOGREL BISULFATE 75 MG PO TABS
75.0000 mg | ORAL_TABLET | Freq: Every day | ORAL | Status: DC
  Filled 2019-02-13: qty 1

## 2019-02-13 MED ORDER — FREE WATER
300.0000 mL | Status: DC
  Administered 2019-02-13 – 2019-02-18 (×30): 300 mL

## 2019-02-13 MED ORDER — POTASSIUM PHOSPHATES 15 MMOLE/5ML IV SOLN
30.0000 mmol | Freq: Once | INTRAVENOUS | Status: DC
  Filled 2019-02-13: qty 10

## 2019-02-13 MED ORDER — POLYETHYLENE GLYCOL 3350 17 G PO PACK: 17.0000 g | PACK | Freq: Every day | ORAL | Status: DC | PRN

## 2019-02-13 MED ORDER — METOPROLOL TARTRATE 25 MG/10 ML ORAL SUSPENSION
25.0000 mg | Freq: Two times a day (BID) | ORAL | Status: DC
  Administered 2019-02-13 – 2019-02-16 (×7): 25 mg
  Filled 2019-02-13 (×8): qty 10

## 2019-02-13 MED ORDER — SODIUM CHLORIDE 0.45 % IV SOLN
INTRAVENOUS | Status: DC
  Administered 2019-02-13: 10:00:00 via INTRAVENOUS

## 2019-02-13 MED ORDER — VITAL HIGH PROTEIN PO LIQD
1000.0000 mL | ORAL | Status: AC
  Administered 2019-02-13: 1000 mL

## 2019-02-13 MED ORDER — SODIUM CHLORIDE 0.9 % IV SOLN
2.0000 g | INTRAVENOUS | Status: DC
  Administered 2019-02-13 – 2019-02-14 (×2): 2 g via INTRAVENOUS
  Filled 2019-02-13 (×2): qty 20

## 2019-02-13 MED ORDER — JEVITY 1.2 CAL PO LIQD: 1000.0000 mL | ORAL | Status: DC

## 2019-02-13 NOTE — Progress Notes (Signed)
Patient was transferred to 5w10 via bed . I did call [name] his daughter to inform her of the transfer
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
## 2019-02-13 NOTE — Progress Notes (Signed)
Initial Nutrition Assessment  RD working remotely.  DOCUMENTATION CODES:   Not applicable  INTERVENTION:   -Continue trickle feeds of Vital High Protein @ 20 ml/hr via NGT  Continue 300 ml free water flush every 4 hours per MD  Tube feeding regimen provides 480 kcal (27% of needs), 42 grams of protein, and 1800 ml of H2O.   -Once Vital High Protein runs out, transition to Jevity 1.2 formula; Initiate @ 20 ml/hr via NGT and increase by 10 ml every 8 hours to goal rate of 65 ml/hr.   30 ml Prostat daily.    Tube feeding regimen provides 1972 kcal (100% of needs), 102 grams of protein, and 1259 ml of H2O.   NUTRITION DIAGNOSIS:   Inadequate oral intake related to inability to eat as evidenced by NPO status.  GOAL:   Patient will meet greater than or equal to 90% of their needs  MONITOR:   Diet advancement, Labs, Weight trends, TF tolerance, Skin, I & O's  REASON FOR ASSESSMENT:   Consult Enteral/tube feeding initiation and management  ASSESSMENT:   Maurice Stone is a 78 year old male Jehovah's Witness with a history of stroke in 2002 and 2015 with R sided deficits, CAD, COPD, dementia, HTN, T2DM and GERD who presented to the ED after being found down at his residence for an unknown amount of time. Per the ED note, he has not been seen for the past 3 days. A friend found him on the ground only wearing his boxer briefs and was unresponsive. The friend who brought him in says that he is usually fully alert and oriented at his baseline.  Pt admitted with DKA, sepsis, and AMS.   7/21- NGT placed (tip placement confirmed in stomach)  Reviewed I/O's: +116 ml x 24 hours and +5.3 ml since admission  UOP: 2 L x 24 hours  Pt unable to provide further history secondary to dementia. Per chart review, pt daughter reports that that has been unable to care for himself recently and is interested in SNF placement at discharge.   Pt unable to take PO's secondary to AMS. NGT was placed  today; per MD, plan for trickle feeds today. Vital High Protein infusing @ 20 ml/hr. Pt also receiving 300 ml free water flush every 4 hours. Complete TF regimen currently providing 480 kcals, 42 grams protein, and 2201 ml free water daily,meeting 27% of estimated kcal needs and 42% of estimated protein needs.   Reviewed wt hx; wt has been stable over the past year, however, pt with distant history of weight loss over the past 5 years. Pt is at high risk for malnutrition, however, unable to identify at this time.   IVFs: 0.45% NaCl @ 75 ml/hr, lactated ringers @ 125 ml/hr   Lab Results  Component Value Date   HGBA1C 13.7 (H) 02/14/2019   PTA DM medications are 80 units insulin aspart protamine- aspart BID and 500 mg metformin BID.   Labs reviewed: Na: 154, K: 3.2, Phos: 1.3, Mg: 2.0, CBGS: 93-123 (inpatient orders for glycemic control are 3-9 units insulin aspart every 4 hours and 15 units insulin detemir every 12 hours).   NUTRITION - FOCUSED PHYSICAL EXAM:    Most Recent Value  Orbital Region  Unable to assess  Upper Arm Region  Unable to assess  Thoracic and Lumbar Region  Unable to assess  Buccal Region  Unable to assess  Temple Region  Unable to assess  Clavicle Bone Region  Unable to assess  Clavicle  and Acromion Bone Region  Unable to assess  Scapular Bone Region  Unable to assess  Dorsal Hand  Unable to assess  Patellar Region  Unable to assess  Anterior Thigh Region  Unable to assess  Posterior Calf Region  Unable to assess  Edema (RD Assessment)  Unable to assess  Hair  Unable to assess  Eyes  Unable to assess  Mouth  Unable to assess  Skin  Unable to assess  Nails  Unable to assess       Diet Order:   Diet Order            Diet NPO time specified  Diet effective now              EDUCATION NEEDS:   No education needs have been identified at this time  Skin:  Skin Assessment: Skin Integrity Issues: Skin Integrity Issues:: Other (Comment) Other: bruises  on upper back and coccyx  Last BM:  02/13/19  Height:   Ht Readings from Last 1 Encounters:  02/13/2019 5\' 11"  (1.803 m)    Weight:   Wt Readings from Last 1 Encounters:  02/13/19 75.2 kg    Ideal Body Weight:  78.2 kg  BMI:  Body mass index is 23.12 kg/m.  Estimated Nutritional Needs:   Kcal:  1800-2000  Protein:  100-115 grams  Fluid:  1.8-2.0 L    Sankalp Ferrell A. Jimmye Norman, RD, LDN, [place] Registered Dietitian II Certified Diabetes Care and Education Specialist Pager: [phone] After hours Pager: [phone]
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
## 2019-02-13 NOTE — Progress Notes (Addendum)
NAME:  Maurice Stone, MRN:  [id number], DOB:  March 10, 1941, LOS: 2 ADMISSION DATE:  01/31/2019, CONSULTATION DATE:  7/20 REFERRING MD:  Daryll Drown, CHIEF COMPLAINT:  Hypotension   Brief History   78 year old Jehovah's Witness with multiple medical problems who lives by himself at home.  Found down by a friend.  Admitted 7/19 with DKA, rhabdomyolysis, and sepsis.  Found to have E. coli bacteremia and subsequently developed hypotension and alteration in mental status on 7/20, for which PCCM was consulted.  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  He reportedly lives at home by himself where he was found down by a friend after not having been seen for 3 days.  He is fully alert and oriented at baseline.  Upon arrival to the emergency department 7/19 he was found to be minimally responsive with fever, tachycardia, and hypotension.  He was given 3.5 L of normal saline with improvement in blood pressure and heart rate.  He was started on insulin infusion for DKA and antibiotics for presumed sepsis.  Laboratory evaluation in the emergency department significant for creatinine 3.45 and creatinine kinase of 3041.  Then in the morning hours of 7/20 hypotension returned and he remained poorly responsive.  Blood cultures became positive for E. coli.  Fevers also continued to climb.  PCCM was consulted.  Past Medical History   has a past medical history of Arthritis, COPD (chronic obstructive pulmonary disease) ([place]), Coronary artery disease, Dementia ([place]), GERD (gastroesophageal reflux disease), Hypertension, Refusal of blood transfusions as patient is Jehovah's Witness, Stroke ([place]) (2002; 2015), and Type II diabetes mellitus ([place]).   Significant Hospital Events   7/19 admit  Consults:  PCCM  Procedures:    Significant Diagnostic  Tests:  [facility] 7/19 > No acute intracranial abnormality. Stable moderate generalized atrophy and mild chronic microvascular ischemic changes of the white matter. Remote lacunar strokes in the left basal ganglia.  Micro Data:  COVD 7/19 > Neg BC 7/19 > E. Coli (BCID) >>> Urine 7/19 >>>GNR  Antimicrobials:  Vancomycin 7/19 Flagyl 7/20 > 7/20 Cefepime 7/19 >>>  Interim history/subjective:  Fever curve going in right direction.  He is unable to voice any complaints to me but follows commands briskly.  Objective   Blood pressure 97/66, pulse (!) 105, temperature 99.1 F (37.3 C), temperature source Oral, resp. rate (!) 28, height 5\' 11"  (1.803 m), weight 75.2 kg, SpO2 94 %.        Intake/Output Summary (Last 24 hours) at 02/13/2019 0920 Last data filed at 02/13/2019 0800 Gross per 24 hour  Intake 2241.24 ml  Output 2000 ml  Net 241.24 ml   Filed Weights   02/20/2019 1523 02/13/19 0600  Weight: 78.8 kg 75.2 kg    Examination: GEN: thin elderly man in NAD HEENT: temporal wasting, MM dry CV: Tachycardic, systolic murmur, ext warm PULM: Clear, no accessory muscle use GI: Soft, +BS EXT: + muscle wasting, no edema NEURO: Moves all 4 ext to command PSYCH: RASS 0 SKIN: no rashes    Resolved Hospital Problem list     Assessment & Plan:  # Septic shock from E coli UTI with bacteremia # DKA- resolved but not taking PO # Severe metabolic derangements related to resuscitation and poor PO  # AKI secondary to poor PO, ATN, NSAIDs and rhabdomyolysis # Encephalopathy? On top of  likely advanced dementia, unclear how far off baseline we are.   # Thrombocytopenia, mild, would just watch for now # Hx HTN, CAD  - Still not taking PO, will place feeding tube and restart home meds, trickle feeds, appreciate if RD can adjust PRN - Continue cefepime, f/u E coli susceptibilities, would do 10 days - Restart home gabapentin (reduced dose), zoloft, aricept, metoprolol (reduced dose) - Plavix  (lower plts), Statin (rhabdo), metformin (AKI), losartan (AKI), NSAID (AKI), norvasc (hypotension) on hold - Switch to 0.45 NS @ 75cc/hr, aggressive FWF to help hypernatremia - Replete K/phos - q4h CBG, continue levemir and SSI as ordered - PT/OT/SLP consults - Long term, will need placement, see below  Best practice:  Diet: TF Pain/Anxiety/Delirium protocol (if indicated): NA VAP protocol (if indicated): NA DVT prophylaxis: protonix GI prophylaxis: enoxaparin Glucose control: levemir, SSI Mobility: up to chair, PT/OT Code Status: FULL Family Communication: called daughter, patient has been unable to care for self recently and they would like to try to get him in a [place] facility that can give him 24h care Disposition: fine for stepdown

## 2019-02-13 NOTE — Care Management (Addendum)
CM acknowledges consult for [facility] SNF Placement request from daughter.  Per attending pt is not yet oriented and has baseline dementia - pt remains non oriented at the current time.   Daughter request long term placement from [facility] - CM explained that [facility] only discharges to short term facilities and arrangements can be made post discharge to transition to Long term placement.   Daughter informed CM that [place] or Medicare benefit can be utilized for placement.  CM contacted transfer coordinator and informed of admit - [facility] packet not [facility] per [facility] due to no [facility] beds in [place].  [facility] SW is Lanelle Bal - CM text paged her at [phone], office number [phone] ext [phone].  Per transfer coordinator pt is 100% vested and can utilize SNF benefitis - no [place] SNF beds so pt will have to be placed in a [place] accepting SNF if VA benefits are utilized.   CM discussed with attending if pt may need psych consult for capacity based on conversation with daughter regarding pts ability to make disposition decisions - attending to determine when or if consult is appropriate   [facility] emailed packet for consideration for SNF to CM  - SW placed papers on shadow chart for attending to fill out - attending will be made aware.  TOC will continue to follow for discharge needs

## 2019-02-13 NOTE — Progress Notes (Signed)
Report called to [place]

## 2019-02-14 DIAGNOSIS — M6282 Rhabdomyolysis: Secondary | ICD-10-CM

## 2019-02-14 DIAGNOSIS — J449 Chronic obstructive pulmonary disease, unspecified: Secondary | ICD-10-CM

## 2019-02-14 DIAGNOSIS — E1110 Type 2 diabetes mellitus with ketoacidosis without coma: Secondary | ICD-10-CM

## 2019-02-14 DIAGNOSIS — F10239 Alcohol dependence with withdrawal, unspecified: Secondary | ICD-10-CM

## 2019-02-14 DIAGNOSIS — I10 Essential (primary) hypertension: Secondary | ICD-10-CM

## 2019-02-14 DIAGNOSIS — E43 Unspecified severe protein-calorie malnutrition: Secondary | ICD-10-CM | POA: Insufficient documentation

## 2019-02-14 DIAGNOSIS — K219 Gastro-esophageal reflux disease without esophagitis: Secondary | ICD-10-CM

## 2019-02-14 DIAGNOSIS — R4182 Altered mental status, unspecified: Secondary | ICD-10-CM

## 2019-02-14 DIAGNOSIS — I69351 Hemiplegia and hemiparesis following cerebral infarction affecting right dominant side: Secondary | ICD-10-CM

## 2019-02-14 DIAGNOSIS — I2510 Atherosclerotic heart disease of native coronary artery without angina pectoris: Secondary | ICD-10-CM

## 2019-02-14 DIAGNOSIS — E870 Hyperosmolality and hypernatremia: Secondary | ICD-10-CM

## 2019-02-14 LAB — GLUCOSE, CAPILLARY
Glucose-Capillary: 104 mg/dL — ABNORMAL HIGH (ref 70–99)
Glucose-Capillary: 100 mg/dL — ABNORMAL HIGH (ref 70–99)
Glucose-Capillary: 161 mg/dL — ABNORMAL HIGH (ref 70–99)
Glucose-Capillary: 148 mg/dL — ABNORMAL HIGH (ref 70–99)
Glucose-Capillary: 135 mg/dL — ABNORMAL HIGH (ref 70–99)
Glucose-Capillary: 140 mg/dL — ABNORMAL HIGH (ref 70–99)
Glucose-Capillary: 182 mg/dL — ABNORMAL HIGH (ref 70–99)

## 2019-02-14 LAB — URINE CULTURE: Culture: 3000 — AB

## 2019-02-14 LAB — BASIC METABOLIC PANEL
Anion gap: 7 (ref 5–15)
BUN: 42 mg/dL — ABNORMAL HIGH (ref 8–23)
CO2: 21 mmol/L — ABNORMAL LOW (ref 22–32)
Calcium: 8.5 mg/dL — ABNORMAL LOW (ref 8.9–10.3)
Chloride: 130 mmol/L — ABNORMAL HIGH (ref 98–111)
Creatinine, Ser: 1.79 mg/dL — ABNORMAL HIGH (ref 0.61–1.24)
GFR calc Af Amer: 42 mL/min — ABNORMAL LOW (ref >60)
GFR calc non Af Amer: 36 mL/min — ABNORMAL LOW (ref >60)
Glucose, Bld: 112 mg/dL — ABNORMAL HIGH (ref 70–99)
Potassium: 3.5 mmol/L (ref 3.5–5.1)
Sodium: 158 mmol/L — ABNORMAL HIGH (ref 135–145)

## 2019-02-14 LAB — CBC
HCT: 34.3 % — ABNORMAL LOW (ref 39.0–52.0)
Hemoglobin: 11.1 g/dL — ABNORMAL LOW (ref 13.0–17.0)
MCH: 28.5 pg (ref 26.0–34.0)
MCHC: 32.4 g/dL (ref 30.0–36.0)
MCV: 87.9 fL (ref 80.0–100.0)
Platelets: 94 10*3/uL — ABNORMAL LOW (ref 150–400)
RBC: 3.9 MIL/uL — ABNORMAL LOW (ref 4.22–5.81)
RDW: 14.7 % (ref 11.5–15.5)
WBC: 8.2 10*3/uL (ref 4.0–10.5)
nRBC: 1 % — ABNORMAL HIGH (ref 0.0–0.2)

## 2019-02-14 LAB — CK: Total CK: 893 U/L — ABNORMAL HIGH (ref 49–397)

## 2019-02-14 LAB — MAGNESIUM: Magnesium: 2.1 mg/dL (ref 1.7–2.4)

## 2019-02-14 LAB — PHOSPHORUS: Phosphorus: 1.8 mg/dL — ABNORMAL LOW (ref 2.5–4.6)

## 2019-02-14 MED ORDER — DEXTROSE 5 % IV SOLN: INTRAVENOUS | Status: DC

## 2019-02-14 MED ORDER — JEVITY 1.2 CAL PO LIQD
1000.0000 mL | ORAL | Status: DC
  Administered 2019-02-15 – 2019-02-16 (×2): 1000 mL
  Filled 2019-02-14 (×5): qty 1000

## 2019-02-14 MED ORDER — ENOXAPARIN SODIUM 40 MG/0.4ML ~~LOC~~ SOLN
40.0000 mg | SUBCUTANEOUS | Status: DC
  Administered 2019-02-14 – 2019-02-15 (×2): 40 mg via SUBCUTANEOUS
  Filled 2019-02-14 (×2): qty 0.4

## 2019-02-14 MED ORDER — ADULT MULTIVITAMIN W/MINERALS CH
1.0000 | ORAL_TABLET | Freq: Every day | ORAL | Status: DC
  Administered 2019-02-14: 1
  Filled 2019-02-14: qty 1

## 2019-02-14 MED ORDER — VITAMIN B-1 100 MG PO TABS: 100.0000 mg | ORAL_TABLET | Freq: Every day | ORAL | Status: DC

## 2019-02-14 MED ORDER — SODIUM PHOSPHATES 45 MMOLE/15ML IV SOLN
10.0000 mmol | Freq: Once | INTRAVENOUS | Status: DC
  Filled 2019-02-14: qty 3.33

## 2019-02-14 MED ORDER — FOLIC ACID 1 MG PO TABS
1.0000 mg | ORAL_TABLET | Freq: Every day | ORAL | Status: DC
  Administered 2019-02-14: 1 mg
  Filled 2019-02-14: qty 1

## 2019-02-14 MED ORDER — THIAMINE HCL 100 MG/ML IJ SOLN
100.0000 mg | Freq: Every day | INTRAMUSCULAR | Status: DC
  Administered 2019-02-14: 100 mg via INTRAVENOUS
  Filled 2019-02-14: qty 2

## 2019-02-14 MED ORDER — PRO-STAT SUGAR FREE PO LIQD
30.0000 mL | Freq: Every day | ORAL | Status: DC
  Administered 2019-02-14 – 2019-02-16 (×3): 30 mL
  Filled 2019-02-14 (×3): qty 30

## 2019-02-14 MED ORDER — SODIUM CHLORIDE 0.45 % IV BOLUS
1000.0000 mL | Freq: Once | INTRAVENOUS | Status: AC
  Administered 2019-02-14: 08:00:00 1000 mL via INTRAVENOUS

## 2019-02-14 MED ORDER — HYDROMORPHONE HCL 1 MG/ML IJ SOLN
0.5000 mg | INTRAMUSCULAR | Status: DC | PRN
  Administered 2019-02-14 – 2019-02-16 (×8): 0.5 mg via INTRAVENOUS
  Filled 2019-02-14 (×10): qty 0.5

## 2019-02-14 MED ORDER — ADULT MULTIVITAMIN W/MINERALS CH: 1.0000 | ORAL_TABLET | Freq: Every day | ORAL | Status: DC

## 2019-02-14 MED ORDER — FOLIC ACID 1 MG PO TABS: 1.0000 mg | ORAL_TABLET | Freq: Every day | ORAL | Status: DC

## 2019-02-14 NOTE — Progress Notes (Signed)
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 Transferred to ICU due to worsening acute encephalopathy and hypotension in setting of E. coli bacteremia. Patient was started on ceftriaxone for 10 day duration, feeding tube was placed due to inability of oral nutrition.  Subjective:  Daughter is bedside. Reports he has alcohol use disorder and misses his medications frequently. In addition, she says that  he abuses some of his medications like gabapentin.  His sister and other 3 children have tried to find him a nursing home to help with medication adherence, help with ADLs and finances.   Team shares his current treatment. IV antibiotic, IV fluids, and tube feeds. His CK has trended down. He is being treated for E.Coli sepsis and receiving supplemental nutrition until his mental status improves.   Of note, the nurse called after we rounded and noted that the patient communicated to her that he was in pain "all over".   Objective:  Vital signs in last 24 hours: Vitals:   02/13/19 1934 02/14/19 0227 02/14/19 0500 02/14/19 0627  BP: 121/81 112/72  132/85  Pulse: (!) 102 92  100  Resp: (!) 24 (!) 25  15  Temp: 98 F (36.7 C) 98.2 F (36.8 C)  98 F (36.7 C)  TempSrc: Oral Oral  Oral  SpO2: 94% 95%  96%  Weight:   76.8 kg   Height:       Physical Exam Constitutional:      General: He is in acute distress.     Appearance: He is ill-appearing and toxic-appearing.  HENT:     Head: Normocephalic and atraumatic.     Mouth/Throat:     Mouth: Mucous membranes are dry.  Cardiovascular:     Rate and Rhythm: Regular rhythm. Tachycardia present.     Pulses: Normal pulses.     Heart sounds: Normal heart sounds. No murmur. No friction rub. No gallop.   Pulmonary:     Effort: Pulmonary effort is normal. No respiratory distress.      Breath sounds: Normal breath sounds. No wheezing or rales.  Abdominal:     General: Abdomen is flat. Bowel sounds are normal. There is no distension.     Palpations: Abdomen is soft.     Tenderness: There is no abdominal tenderness. There is no guarding.  Musculoskeletal:     Right lower leg: No edema.     Left lower leg: No edema.  Skin:    General: Skin is warm.  Neurological:     Mental Status: He is disoriented.     Cranial Nerves: Cranial nerve deficit (Unable to assess due to AMS) present.     Comments: Response with opening of the eyes when name is called.  Intermittently follows commands.  Able to wiggle toes when asked to do so.  Frequently moans and groans  Psychiatric:     Comments: unable to access due to AMS    Assessment/Plan:  Active Problems:   DKA (diabetic ketoacidosis) ([place])  In summary Maurice Stone is a 78 year old gentleman with a history ofmaleJehovah's Witness with a history of stroke in 2002 and 2015 with R sided deficits, CAD, COPD, dementia,HTN, T2DM and GERD who presentedto the ED after being found down at his residence for an unknown amount of time.It appears that he has several conditions going on at  the same time contributing to AMS, including sepsis, DKA, and rhabdomyolysis. The patient spent a night in the MICU due to hemodynamic instability and has now been transferred back to the internal medicine teaching service for further treatment.  #Altered Mental Status #DKA: RESOLVED  #E. Coli Sepsis:The patient initially presented with a fever of 101.3, tachycardia and hypotensionand notable labs significant for leukocytosis, severe hyperglycemia (800s), and large anion gap of 28(now normalized) concerning for picture of sepsis. He was started on broad-spectrum antibiotics in the ED and given nearly 6 L of fluid over the first 24 hrs. Vital signs did not imrpove much and he spent a night in the MICU for closer f/u. Started cefepime and flagyl for one dose  but subsequently narrowed to ceftriaxone.His vitals have been stabilized. Blood cultures have returned positive for E. Coli, sensitivities pending. He has had E. Coli UTIs in the past  which have been sensitive to Ceftriaxone.  -Continue Ceftriaxone for 10 days total per MICU recs (day 4/10) - 1/2NS bolus 554mL - Continuous D5W 18mL/hr   #Pain: Per nursing today, the patient was able to say that he was in pain "all over". Consistently moaning, groaning and appears uncomfortable.  - Started Dilaudid IV 0.5 mg q4hr PRN - nurse was instructed to give if the patient appeared uncomfortable or if she thinks he is in pain.   #Rhabdomyolysis: Patient presented with a CK of 3041. Peaked at 3837, now 893. No need to continue trending at this point. - Continue IV fluids as mentioned above  #AKI:Baseline creatinine 1.0. Patient presented with a creatinine of 3.45, now 1.79. Renal injury likley multifactorial, secondary to sepsis, DKA and rhabdomyolysis. -Continue IV fluids as mentioned above -Daily BMP  #DVT prophylaxis -Lovenox 30 mg subq daily   #FEN/GI: #Hypernatremia:  - NPO -Continue supplemental tube feeds 368ml q 4 hrs for total of 1.8L over 24 hrs  - IV fluids as mentioned above.   #Code Status: Full  #Dispo: Family looking for SNF when the patient is medically ready for discharge. [facility].

## 2019-02-14 NOTE — Progress Notes (Addendum)
Nutrition Follow-up  DOCUMENTATION CODES:   Severe malnutrition in context of chronic illness  INTERVENTION:   -Continue Jevity 1.2 @ 20 ml/hr via NGT and increase by 10 ml every 12 hours to goal rate of 65 ml/hr.   30 ml Prostat daily.    Tube feeding regimen provides 1972 kcal (100% of needs), 102 grams of protein, and 1259 ml of H2O.  -Continue 300 ml free water flush every 4 hours  -Monitor K, Mg, and Phos daily x 3 days and replete as necessary due to high refeeding risk  NUTRITION DIAGNOSIS:   Severe Malnutrition related to chronic illness(CVA, dementia) as evidenced by severe muscle depletion, severe fat depletion.  Ongoing  GOAL:   Patient will meet greater than or equal to 90% of their needs  Progressing   MONITOR:   Diet advancement, Labs, Weight trends, TF tolerance, Skin, I & O's  REASON FOR ASSESSMENT:   Consult Enteral/tube feeding initiation and management  ASSESSMENT:   Maurice Stone is a 78 year old male Jehovah's Witness with a history of stroke in 2002 and 2015 with R sided deficits, CAD, COPD, dementia, HTN, T2DM and GERD who presented to the ED after being found down at his residence for an unknown amount of time. Per the ED note, he has not been seen for the past 3 days. A friend found him on the ground only wearing his boxer briefs and was unresponsive. The friend who brought him in says that he is usually fully alert and oriented at his baseline.  7/21- NGT placed (tip placement confirmed in stomach) 7/22- s/p BSE- recommend continue NPO  Reviewed I/O's: -274 ml x 24 hours and +5 L since admission  UOP: 3.3 L x 24 hours  Pt did not answer questions at time of visit. Pt constantly repeating "I want water", but did not engage with this RD.   At time of visit Vital High Protein infusing via NGT at 20 ml/hr, which provides 480 kcals, 42 grams protein, and 401 ml free water daily,meeting 27% of estimated kcal needs and 42% of estimated protein  needs. Pt has been transitioned to Jevity 1.2 formula after RD visit per [facility].   Labs reviewed: Na: 158, K and Mg WDL, Phos: 1.8 (on IV supplementation), CBGS: 100-148 (inpatient orders for glycemic control are 3-9 units insulin aspart ebery 4 hours and 15 units insulin detemir every 12 hours).   NUTRITION - FOCUSED PHYSICAL EXAM:    Most Recent Value  Orbital Region  Severe depletion  Upper Arm Region  Severe depletion  Thoracic and Lumbar Region  Moderate depletion  Buccal Region  Severe depletion  Temple Region  Severe depletion  Clavicle Bone Region  Severe depletion  Clavicle and Acromion Bone Region  Severe depletion  Scapular Bone Region  Severe depletion  Dorsal Hand  Severe depletion  Patellar Region  Severe depletion  Anterior Thigh Region  Severe depletion  Posterior Calf Region  Severe depletion  Edema (RD Assessment)  None  Hair  Reviewed  Eyes  Reviewed  Mouth  Reviewed  Skin  Reviewed  Nails  Reviewed       Diet Order:   Diet Order            Diet NPO time specified  Diet effective now              EDUCATION NEEDS:   No education needs have been identified at this time  Skin:  Skin Assessment: Skin Integrity Issues: Skin Integrity Issues::  Other (Comment) Other: bruises on upper back and coccyx  Last BM:  02/13/19  Height:   Ht Readings from Last 1 Encounters:  01/29/2019 5\' 11"  (1.803 m)    Weight:   Wt Readings from Last 1 Encounters:  02/14/19 76.8 kg    Ideal Body Weight:  78.2 kg  BMI:  Body mass index is 23.61 kg/m.  Estimated Nutritional Needs:   Kcal:  1800-2000  Protein:  100-115 grams  Fluid:  1.8-2.0 L    Ellora Varnum A. Jimmye Norman, RD, LDN, [place] Registered Dietitian II Certified Diabetes Care and Education Specialist Pager: [phone] After hours Pager: [phone]

## 2019-02-14 NOTE — Progress Notes (Signed)
  Speech Language Pathology Treatment: Dysphagia  Patient Details Name: Maurice Stone MRN: [id number] DOB: 1940/10/09 Today's Date: 02/14/2019 Time: 4010-2725 SLP Time Calculation (min) (ACUTE ONLY): 7 min  Assessment / Plan / Recommendation Clinical Impression  Pt was lethargic today having recently received pain medication, but was able to rouse and opened eyes in response to SLP instruction. Consistent throat clearing occurring at baseline.  Pt attempted to retrieve ice chip from spoon, but was unable.  A very small amount of water was given via teaspoon.  No pharyngeal swallow response was palpated. Throat clearing changed in quality to a wet throat clear. SLP attempted to use suction to clear any residuals.  Thick tan secretions removed from oral cavity with suction.  Some red/bloody secretions noted as well.  RN notified.  Pt was very resistant to oral care and oral cavity could not be visualized.   Recommend pt remain NPO with alternate means of nutrition, hydration, and medication.  Continue frequent, thorough oral care.    HPI HPI: Patient is a 78 y.o. male with PMH: COPD, DM, gastritis, HTN, HLD, anxiety, chronic pain, ongoing dysphagia with a recent h/o dyspnea and productive coughing, who was admitted to hospital with worsening of his symptoms. CXR did not reveal pulmonary edema or evidence of PNA. Per MD report, patient has been taking his inhalers incorrectly at home, likely due to poor cognitive ability.      SLP Plan  Continue with current plan of care       Recommendations  Diet recommendations: NPO Medication Administration: Via alternative means                Oral Care Recommendations: Oral care QID SLP Visit Diagnosis: Dysphagia, oropharyngeal phase (R13.12) Plan: Continue with current plan of care       [place], [place], [place] Office: [phone]; Pager (7/22): [phone] 02/14/2019, 2:43  PM

## 2019-02-14 NOTE — Progress Notes (Signed)
  Date: 02/14/2019  Patient name: Maurice Stone  Medical record number: [id number]  Date of birth: 23-Sep-1940   I have seen and evaluated this patient and I have discussed the plan of care with the house staff. Please see Dr. Redgie Grayer note for complete details. I concur with his findings and plan.  Would low dose CIWA protocol in case he undergoes withdrawal given family update about his current ETOH intake.    Sid Falcon, MD 02/14/2019, 4:11 PM

## 2019-02-14 NOTE — NC FL2 (Signed)
[place] LEVEL OF CARE SCREENING TOOL     IDENTIFICATION  Patient Name: Maurice Stone Birthdate: 27-Dec-1940 Sex: male Admission Date (Current Location): 02/01/2019  [facility] and [place] Number:  Herbalist and Address:  [facility], [place] [address] [place], [place] [address]      Provider Number: [id number]  Attending Physician Name and Address:  Sid Falcon, MD  Relative Name and Phone Number:  [name] daughter [phone]    Current Level of Care: Hospital Recommended Level of Care: [place] Prior Approval Number:    Date Approved/Denied:   PASRR Number: [id number]  Discharge Plan: SNF    Current Diagnoses: Patient Active Problem List   Diagnosis Date Noted  . Protein-calorie malnutrition, severe 02/14/2019  . Altered mental status   . DKA (diabetic ketoacidosis) ([place]) 01/28/2019  . E coli bacteremia 05/02/2017  . Lower urinary tract infectious disease   . Sepsis ([place])   . Insulin dependent type 2 diabetes mellitus, uncontrolled ([place])   . Hemorrhoids   . Diabetic mononeuropathy associated with type 2 diabetes mellitus ([place])   . Hyperglycemia 05/01/2017  . Hyperlipidemia   . History of CVA with residual deficit   . TIA (transient ischemic attack) 02/02/2015  . Numbness 03/29/2014  . Stroke ([place]) 03/28/2014  . Diabetes mellitus ([place]) 03/28/2014  . CVA (cerebral infarction) 01/18/2014  . Acute CVA (cerebrovascular accident) ([place]) 01/17/2014  . CVA (cerebral vascular accident) ([place]) 01/15/2014  . Essential hypertension 01/15/2014  . Bradycardia 01/15/2014  . chronic Right sided weakness 01/15/2014  . GERD (gastroesophageal reflux disease) 01/15/2014  . Neuropathy 01/15/2014    Orientation RESPIRATION BLADDER Height & Weight     Self  O2(Nasal cannula 4L) Incontinent, External catheter Weight: 169 lb 5 oz (76.8 kg) Height:  5\' 11"  (180.3 cm)  BEHAVIORAL SYMPTOMS/MOOD NEUROLOGICAL  BOWEL NUTRITION STATUS      Incontinent    AMBULATORY STATUS COMMUNICATION OF NEEDS Skin   Extensive Assist Verbally Other (Comment)(Wound on back and coccyx with foam dressing)                       Personal Care Assistance Level of Assistance  Bathing, Feeding, Dressing Bathing Assistance: Maximum assistance Feeding assistance: Maximum assistance Dressing Assistance: Maximum assistance     Functional Limitations Info  Sight, Hearing, Speech Sight Info: Adequate Hearing Info: Adequate Speech Info: Adequate    SPECIAL CARE FACTORS FREQUENCY  PT (By licensed PT), OT (By licensed OT), Speech therapy     PT Frequency: 3x/week OT Frequency: 3x/week     Speech Therapy Frequency: 2x/week      Contractures Contractures Info: Not present    Additional Factors Info  Code Status, Allergies, Psychotropic, Insulin Sliding Scale Code Status Info: Full Allergies Info: Metformin And Related, Other, Lisinopril Psychotropic Info: Zoloft Insulin Sliding Scale Info: See dc summary for dose       Current Medications (02/14/2019):  This is the current hospital active medication list Current Facility-Administered Medications  Medication Dose Route Frequency Provider Last Rate Last Dose  . cefTRIAXone (ROCEPHIN) 2 g in sodium chloride 0.9 % 100 mL IVPB  2 g Intravenous Q24H Kara Mead V, MD 200 mL/hr at 02/13/19 1708 2 g at 02/13/19 1708  . Chlorhexidine Gluconate Cloth 2 % PADS 6 each  6 each Topical Q0600 Rigoberto Noel, MD   6 each at 02/14/19 [phone]  . donepezil (ARICEPT) tablet 10 mg  10  mg Per Tube QHS Candee Furbish, MD   10 mg at 02/13/19 2244  . enoxaparin (LOVENOX) injection 40 mg  40 mg Subcutaneous Q24H Pierce, Dwayne A, RPH      . feeding supplement (JEVITY 1.2 CAL) liquid 1,000 mL  1,000 mL Per Tube Continuous Gilles Chiquito B, MD      . feeding supplement (PRO-STAT SUGAR FREE 64) liquid 30 mL  30 mL Per Tube Daily Sid Falcon, MD      . Derrill Memo ON 11/01/8117] folic  acid (FOLVITE) tablet 1 mg  1 mg Per Tube Daily Gilles Chiquito B, MD      . free water 300 mL  300 mL Per Tube Q4H Candee Furbish, MD   300 mL at 02/14/19 1333  . gabapentin (NEURONTIN) 300 MG/6ML solution 100 mg  100 mg Oral Q8H Candee Furbish, MD   100 mg at 02/14/19 1332  . HYDROmorphone (DILAUDID) injection 0.5 mg  0.5 mg Intravenous Q4H PRN Earlene Plater, MD   0.5 mg at 02/14/19 1242  . insulin aspart (novoLOG) injection 3-9 Units  3-9 Units Subcutaneous Q4H Rigoberto Noel, MD   3 Units at 02/14/19 1331  . insulin detemir (LEVEMIR) injection 15 Units  15 Units Subcutaneous Q12H Rigoberto Noel, MD   15 Units at 02/14/19 0847  . lactated ringers infusion   Intravenous Continuous Rigoberto Noel, MD   Stopped at 02/13/19 [phone]  . metoprolol tartrate (LOPRESSOR) 25 mg/10 mL oral suspension 25 mg  25 mg Per Tube BID Candee Furbish, MD   25 mg at 02/14/19 0849  . [START ON 02/15/2019] multivitamin with minerals tablet 1 tablet  1 tablet Per Tube Daily Gilles Chiquito B, MD      . pantoprazole (PROTONIX) injection 40 mg  40 mg Intravenous Q24H Corey Harold, NP   40 mg at 02/13/19 2013  . polyethylene glycol (MIRALAX / GLYCOLAX) packet 17 g  17 g Oral Daily PRN Candee Furbish, MD      . potassium PHOSPHATE 30 mmol in dextrose 5 % 500 mL infusion  30 mmol Intravenous Once Candee Furbish, MD   Stopped at 02/13/19 1007  . sertraline (ZOLOFT) tablet 100 mg  100 mg Per Tube QHS Candee Furbish, MD   100 mg at 02/13/19 2244  . sodium chloride flush (NS) 0.9 % injection 3 mL  3 mL Intravenous Q12H Neva Seat, MD   3 mL at 02/14/19 0847  . thiamine (VITAMIN B-1) tablet 100 mg  100 mg Oral Daily Chundi, Vahini, MD       Or  . thiamine (B-1) injection 100 mg  100 mg Intravenous Daily Chundi, Vahini, MD         Discharge Medications: Please see discharge summary for a list of discharge medications.  Relevant Imaging Results:  Relevant Lab Results:   Additional Information SSn: [id number]    COVID negative on 02/12/2019  Benard Halsted, LCSW

## 2019-02-14 NOTE — Progress Notes (Signed)
PT Cancellation Note  Patient Details Name: Maurice Stone MRN: [id number] DOB: 06-16-1941   Cancelled Treatment:     Attempted to work with patient at 1740, too lethargic to participate. Will cont to follow.  [name], PT, DPT Acute Rehabilitation Services Pager: [phone] Office: [phone]     [name] 02/14/2019, 5:39 PM

## 2019-02-14 NOTE — Progress Notes (Signed)
Occupational Therapy Evaluation Patient Details Name: Maurice Stone MRN: [id number] DOB: 09-17-1940 Today's Date: 02/14/2019    History of Present Illness Maurice Stone is a 78 year old gentleman with a history ofmale Jehovah's Witness with a history of stroke in 2002 and 2015 with R sided deficits, CAD, COPD, dementia, HTN, T2DM and GERD who presented to the ED after being found down at his residence for an unknown amount of time.    Clinical Impression   PTA, Maurice Stone was living at home alone, Maurice Stone's daughter, Maurice Stone, present during session and provided PLOF and home setup. Maurice Stone's daughter reports Maurice Stone was independent with ADL/IADL and functional mobility at RW level. Maurice Stone's daughter reports Maurice Stone was not taking good care of himself and was eating poorly, drinking a lot and non-compliant with medication. Maurice Stone currently requires maxA-totalA for ADL. Maurice Stone able to tolerate sitting EOB for about 5 min before requesting to return to supine due to fatigue. Due to decline in current level of function, Maurice Stone would benefit from acute OT to address established goals to facilitate safe D/C to venue listed below. At this time, recommend SNF follow-up. Will continue to follow acutely.     Follow Up Recommendations  SNF    Equipment Recommendations  3 in 1 bedside commode;Hospital bed    Recommendations for Other Services Maurice Stone consult     Precautions / Restrictions Precautions Precautions: Fall Precaution Comments: NG tube Restrictions Weight Bearing Restrictions: No      Mobility Bed Mobility Overal bed mobility: Needs Assistance Bed Mobility: Supine to Sit;Sit to Supine     Supine to sit: Mod assist;HOB elevated Sit to supine: Max assist;HOB elevated   General bed mobility comments: assist for BLE and trunk management   Transfers                 General transfer comment: deferred    Balance Overall balance assessment: Needs assistance Sitting-balance support: Bilateral upper extremity  supported;Feet supported Sitting balance-Leahy Scale: Poor Sitting balance - Comments: requires BUE support to maintain balance;demonstrated kypohotic posutre sitting EOB, max cueing for upright posture;able to tolerate sitting EOB for about 47min                                   ADL either performed or assessed with clinical judgement   ADL Overall ADL's : Needs assistance/impaired Eating/Feeding: Maximal assistance;Total assistance Eating/Feeding Details (indicate cue type and reason): Maurice Stone on NG tube Grooming: Minimal assistance;Sitting   Upper Body Bathing: Maximal assistance   Lower Body Bathing: Maximal assistance   Upper Body Dressing : Maximal assistance   Lower Body Dressing: Total assistance;Sitting/lateral leans                 General ADL Comments: deferred functional mobility, Maurice Stone reported increased fatigue after sitting EOB for about 64minutes     Vision         Perception     Praxis      Pertinent Vitals/Pain Pain Assessment: Faces Faces Pain Scale: Hurts a little bit Pain Location: RUE with movement Pain Descriptors / Indicators: Moaning;Grimacing Pain Intervention(s): Limited activity within patient's tolerance;Monitored during session;Repositioned     Hand Dominance Right   Extremity/Trunk Assessment Upper Extremity Assessment Upper Extremity Assessment: RUE deficits/detail;LUE deficits/detail;Generalized weakness RUE Deficits / Details: grossly 2-/5;unable to use functionally, grimacing in pain with movement;skin integrity intact  RUE: Unable to fully assess due to pain RUE Coordination: decreased fine motor;decreased  gross motor LUE Deficits / Details: grossly 3-/5;   Lower Extremity Assessment Lower Extremity Assessment: Defer to Maurice Stone evaluation   Cervical / Trunk Assessment Cervical / Trunk Assessment: Kyphotic   Communication Communication Communication: Expressive difficulties   Cognition Arousal/Alertness:  Lethargic Behavior During Therapy: Flat affect Overall Cognitive Status: Impaired/Different from baseline Area of Impairment: Attention;Memory;Following commands;Safety/judgement;Awareness;Problem solving                   Current Attention Level: Focused Memory: Decreased short-term memory;Decreased recall of precautions Following Commands: Follows one step commands inconsistently;Follows one step commands with increased time Safety/Judgement: Decreased awareness of safety;Decreased awareness of deficits Awareness: Intellectual Problem Solving: Slow processing;Difficulty sequencing;Requires verbal cues;Requires tactile cues General Comments: Maurice Stone impulsive, Maurice Stone in hand restraints and posey belt due to impulsivity;Maurice Stone attempted to pull NG tube required VC to leave lines alone   General Comments  Maurice Stone's daughter present during session;VSS throughout    Exercises     Shoulder Instructions      Home Living Family/patient expects to be discharged to:: Skilled nursing facility                                 Additional Comments: Maurice Stone's daughter present during session, reports Maurice Stone was living alone and was independent but not taking good care of his health, was noncompliant with medication management, drinking alcohol, and had a poor diet;       Prior Functioning/Environment Level of Independence: Independent with assistive device(s)        Comments: Maurice Stone's daughter reports Maurice Stone was ambulating with RW and was independent with ADL although was not taking good care of himself        OT Problem List: Decreased strength;Decreased range of motion;Decreased activity tolerance;Impaired balance (sitting and/or standing);Decreased cognition;Decreased safety awareness;Decreased knowledge of use of DME or AE;Impaired UE functional use;Pain      OT Treatment/Interventions: Self-care/ADL training;Therapeutic exercise;Energy conservation;DME and/or AE instruction;Modalities;Therapeutic  activities;Splinting;Cognitive remediation/compensation;Patient/family education;Balance training    OT Goals(Current goals can be found in the care plan section) Acute Rehab OT Goals Patient Stated Goal: Maurice Stone did not state OT Goal Formulation: With patient Time For Goal Achievement: 02/28/19 Potential to Achieve Goals: Good ADL Goals Maurice Stone Will Perform Eating: with modified independence Maurice Stone Will Perform Grooming: with modified independence Maurice Stone Will Perform Upper Body Dressing: with supervision;sitting Maurice Stone Will Perform Lower Body Dressing: with min assist;sit to/from stand Maurice Stone Will Transfer to Toilet: with min assist;ambulating  OT Frequency: Min 2X/week   Barriers to D/C: Decreased caregiver support  Maurice Stone was living alone       Co-evaluation              AM-PAC OT "6 Clicks" Daily Activity     Outcome Measure Help from another person eating meals?: Total Help from another person taking care of personal grooming?: A Little Help from another person toileting, which includes using toliet, bedpan, or urinal?: Total Help from another person bathing (including washing, rinsing, drying)?: A Lot Help from another person to put on and taking off regular upper body clothing?: A Lot Help from another person to put on and taking off regular lower body clothing?: Total 6 Click Score: 10   End of Session Equipment Utilized During Treatment: Gait belt Nurse Communication: Mobility status  Activity Tolerance: Patient tolerated treatment well;Patient limited by fatigue Patient left: in bed;with call bell/phone within reach;with bed alarm set;with family/visitor present;with restraints reapplied  OT  Visit Diagnosis: Unsteadiness on feet (R26.81);Other abnormalities of gait and mobility (R26.89);Muscle weakness (generalized) (M62.81);History of falling (Z91.81);Feeding difficulties (R63.3);Other symptoms and signs involving cognitive function;Pain Pain - Right/Left: Right Pain - part of body: Hand                 Time: 2505-3976 OT Time Calculation (min): 31 min Charges:  OT General Charges $OT Visit: 1 Visit OT Evaluation $OT Eval Moderate Complexity: 1 Mod OT Treatments $Self Care/Home Management : 8-22 mins  Dorinda Hill OTR/L Acute Rehabilitation Services Office: [place] 02/14/2019, 11:15 AM

## 2019-02-14 NOTE — Care Management Important Message (Signed)
Important Message  Patient Details  Name: Maurice Stone MRN: [id number] Date of Birth: 05-16-1941   Medicare Important Message Given:  Yes  Due to illness patient could not sign.  Unsigned copy left at patient bedside   Orbie Pyo 02/14/2019, 12:54 PM

## 2019-02-15 ENCOUNTER — Inpatient Hospital Stay (HOSPITAL_COMMUNITY): Payer: Medicare Other

## 2019-02-15 LAB — CBC
HCT: 35.6 % — ABNORMAL LOW (ref 39.0–52.0)
Hemoglobin: 11.5 g/dL — ABNORMAL LOW (ref 13.0–17.0)
MCH: 28.3 pg (ref 26.0–34.0)
MCHC: 32.3 g/dL (ref 30.0–36.0)
MCV: 87.7 fL (ref 80.0–100.0)
Platelets: 83 10*3/uL — ABNORMAL LOW (ref 150–400)
RBC: 4.06 MIL/uL — ABNORMAL LOW (ref 4.22–5.81)
RDW: 15.3 % (ref 11.5–15.5)
WBC: 6.3 10*3/uL (ref 4.0–10.5)
nRBC: 0.6 % — ABNORMAL HIGH (ref 0.0–0.2)

## 2019-02-15 LAB — BASIC METABOLIC PANEL
Anion gap: 9 (ref 5–15)
BUN: 38 mg/dL — ABNORMAL HIGH (ref 8–23)
CO2: 22 mmol/L (ref 22–32)
Calcium: 8.3 mg/dL — ABNORMAL LOW (ref 8.9–10.3)
Chloride: 127 mmol/L — ABNORMAL HIGH (ref 98–111)
Creatinine, Ser: 1.69 mg/dL — ABNORMAL HIGH (ref 0.61–1.24)
GFR calc Af Amer: 45 mL/min — ABNORMAL LOW (ref >60)
GFR calc non Af Amer: 39 mL/min — ABNORMAL LOW (ref >60)
Glucose, Bld: 323 mg/dL — ABNORMAL HIGH (ref 70–99)
Potassium: 3.7 mmol/L (ref 3.5–5.1)
Sodium: 158 mmol/L — ABNORMAL HIGH (ref 135–145)

## 2019-02-15 LAB — GLUCOSE, CAPILLARY
Glucose-Capillary: 216 mg/dL — ABNORMAL HIGH (ref 70–99)
Glucose-Capillary: 249 mg/dL — ABNORMAL HIGH (ref 70–99)
Glucose-Capillary: 267 mg/dL — ABNORMAL HIGH (ref 70–99)
Glucose-Capillary: 334 mg/dL — ABNORMAL HIGH (ref 70–99)
Glucose-Capillary: 334 mg/dL — ABNORMAL HIGH (ref 70–99)
Glucose-Capillary: 366 mg/dL — ABNORMAL HIGH (ref 70–99)

## 2019-02-15 LAB — PHOSPHORUS: Phosphorus: 1.9 mg/dL — ABNORMAL LOW (ref 2.5–4.6)

## 2019-02-15 LAB — MAGNESIUM: Magnesium: 2.1 mg/dL (ref 1.7–2.4)

## 2019-02-15 MED ORDER — DEXTROSE 5 % IV BOLUS
1000.0000 mL | Freq: Once | INTRAVENOUS | Status: AC
  Administered 2019-02-15: 1000 mL via INTRAVENOUS

## 2019-02-15 MED ORDER — DEXTROSE 5 % IV BOLUS: 1000.0000 mL | Freq: Once | INTRAVENOUS | Status: DC

## 2019-02-15 MED ORDER — LORAZEPAM 2 MG/ML IJ SOLN: 1.0000 mg | Freq: Four times a day (QID) | INTRAMUSCULAR | Status: DC | PRN

## 2019-02-15 MED ORDER — ADULT MULTIVITAMIN W/MINERALS CH
1.0000 | ORAL_TABLET | Freq: Every day | ORAL | Status: DC
  Filled 2019-02-15: qty 1

## 2019-02-15 MED ORDER — POTASSIUM PHOSPHATES 15 MMOLE/5ML IV SOLN
30.0000 mmol | Freq: Once | INTRAVENOUS | Status: AC
  Administered 2019-02-15: 09:00:00 30 mmol via INTRAVENOUS
  Filled 2019-02-15: qty 10

## 2019-02-15 MED ORDER — INSULIN ASPART 100 UNIT/ML ~~LOC~~ SOLN: 5.0000 [IU] | Freq: Once | SUBCUTANEOUS | Status: DC

## 2019-02-15 MED ORDER — INSULIN DETEMIR 100 UNIT/ML ~~LOC~~ SOLN
15.0000 [IU] | Freq: Every day | SUBCUTANEOUS | Status: DC
  Administered 2019-02-15: 15 [IU] via SUBCUTANEOUS
  Filled 2019-02-15 (×2): qty 0.15

## 2019-02-15 MED ORDER — THIAMINE HCL 100 MG/ML IJ SOLN
100.0000 mg | Freq: Every day | INTRAMUSCULAR | Status: DC
  Filled 2019-02-15: qty 2

## 2019-02-15 MED ORDER — FOLIC ACID 1 MG PO TABS
1.0000 mg | ORAL_TABLET | Freq: Every day | ORAL | Status: DC
  Filled 2019-02-15: qty 1

## 2019-02-15 MED ORDER — SODIUM CHLORIDE 0.9% FLUSH
10.0000 mL | INTRAVENOUS | Status: DC | PRN
  Administered 2019-02-28 – 2019-03-02 (×2): 10 mL
  Filled 2019-02-15 (×2): qty 40

## 2019-02-15 MED ORDER — LORAZEPAM 1 MG PO TABS: 1.0000 mg | ORAL_TABLET | Freq: Four times a day (QID) | ORAL | Status: DC | PRN

## 2019-02-15 MED ORDER — INSULIN ASPART 100 UNIT/ML ~~LOC~~ SOLN
0.0000 [IU] | SUBCUTANEOUS | Status: DC
  Administered 2019-02-15: 11 [IU] via SUBCUTANEOUS
  Administered 2019-02-15: 5 [IU] via SUBCUTANEOUS
  Administered 2019-02-15: 11 [IU] via SUBCUTANEOUS
  Administered 2019-02-15: 01:00:00 8 [IU] via SUBCUTANEOUS
  Administered 2019-02-15: 5 [IU] via SUBCUTANEOUS
  Administered 2019-02-15: 15 [IU] via SUBCUTANEOUS
  Administered 2019-02-16: 08:00:00 8 [IU] via SUBCUTANEOUS
  Administered 2019-02-16: 20:00:00 3 [IU] via SUBCUTANEOUS
  Administered 2019-02-16: 8 [IU] via SUBCUTANEOUS
  Administered 2019-02-16: 11 [IU] via SUBCUTANEOUS
  Administered 2019-02-16 – 2019-02-17 (×3): 8 [IU] via SUBCUTANEOUS
  Administered 2019-02-17 (×5): 5 [IU] via SUBCUTANEOUS
  Administered 2019-02-18: 11 [IU] via SUBCUTANEOUS
  Administered 2019-02-18 (×2): 8 [IU] via SUBCUTANEOUS
  Administered 2019-02-18: 16:00:00 11 [IU] via SUBCUTANEOUS
  Administered 2019-02-18: 5 [IU] via SUBCUTANEOUS
  Administered 2019-02-18: 8 [IU] via SUBCUTANEOUS
  Administered 2019-02-19 (×2): 11 [IU] via SUBCUTANEOUS
  Administered 2019-02-19: 8 [IU] via SUBCUTANEOUS
  Administered 2019-02-19 (×2): 11 [IU] via SUBCUTANEOUS
  Administered 2019-02-19: 8 [IU] via SUBCUTANEOUS
  Administered 2019-02-20: 5 [IU] via SUBCUTANEOUS
  Administered 2019-02-20: 8 [IU] via SUBCUTANEOUS
  Administered 2019-02-20 (×2): 11 [IU] via SUBCUTANEOUS
  Administered 2019-02-20: 5 [IU] via SUBCUTANEOUS
  Administered 2019-02-20: 8 [IU] via SUBCUTANEOUS
  Administered 2019-02-21 (×4): 3 [IU] via SUBCUTANEOUS
  Administered 2019-02-21: 2 [IU] via SUBCUTANEOUS
  Administered 2019-02-21: 8 [IU] via SUBCUTANEOUS
  Administered 2019-02-22 (×3): 3 [IU] via SUBCUTANEOUS
  Administered 2019-02-22 (×2): 2 [IU] via SUBCUTANEOUS
  Administered 2019-02-23 (×2): 3 [IU] via SUBCUTANEOUS
  Administered 2019-02-23 (×3): 5 [IU] via SUBCUTANEOUS
  Administered 2019-02-24 (×6): 3 [IU] via SUBCUTANEOUS
  Administered 2019-02-24 – 2019-02-25 (×5): 5 [IU] via SUBCUTANEOUS
  Administered 2019-02-25 – 2019-02-26 (×3): 3 [IU] via SUBCUTANEOUS
  Administered 2019-02-26: 08:00:00 5 [IU] via SUBCUTANEOUS
  Administered 2019-02-26 – 2019-02-27 (×4): 3 [IU] via SUBCUTANEOUS
  Administered 2019-02-27: 5 [IU] via SUBCUTANEOUS
  Administered 2019-02-27 – 2019-02-28 (×6): 3 [IU] via SUBCUTANEOUS
  Administered 2019-02-28: 5 [IU] via SUBCUTANEOUS
  Administered 2019-03-01 (×2): 8 [IU] via SUBCUTANEOUS
  Administered 2019-03-01 (×2): 5 [IU] via SUBCUTANEOUS
  Administered 2019-03-01: 13:00:00 11 [IU] via SUBCUTANEOUS
  Administered 2019-03-02 (×2): 2 [IU] via SUBCUTANEOUS

## 2019-02-15 MED ORDER — CEFAZOLIN SODIUM-DEXTROSE 2-4 GM/100ML-% IV SOLN
2.0000 g | Freq: Three times a day (TID) | INTRAVENOUS | Status: DC
  Administered 2019-02-15 – 2019-02-16 (×3): 2 g via INTRAVENOUS
  Filled 2019-02-15 (×5): qty 100

## 2019-02-15 MED ORDER — VITAMIN B-1 100 MG PO TABS
100.0000 mg | ORAL_TABLET | Freq: Every day | ORAL | Status: DC
  Administered 2019-02-15 – 2019-02-16 (×2): 100 mg via ORAL
  Filled 2019-02-15 (×2): qty 1

## 2019-02-15 MED ORDER — SODIUM CHLORIDE 0.9% FLUSH
10.0000 mL | Freq: Two times a day (BID) | INTRAVENOUS | Status: DC
  Administered 2019-02-15 – 2019-02-24 (×13): 10 mL
  Administered 2019-02-24: 20 mL
  Administered 2019-02-25: 10:00:00 10 mL
  Administered 2019-02-25 – 2019-02-26 (×2): 30 mL
  Administered 2019-02-26 – 2019-02-27 (×2): 10 mL
  Administered 2019-02-27 – 2019-02-28 (×2): 20 mL
  Administered 2019-02-28 – 2019-03-01 (×3): 10 mL

## 2019-02-15 NOTE — Progress Notes (Signed)
  Date: 02/15/2019  Patient name: Maurice Stone  Medical record number: [id number]  Date of birth: Jan 05, 1941   I have seen and evaluated this patient and I have discussed the plan of care with the house staff. Please see Dr. Redgie Grayer note for complete details. I concur with his findings and plan.    Sid Falcon, MD 02/15/2019, 7:36 PM

## 2019-02-15 NOTE — Progress Notes (Signed)
Subjective: Patient opens eyes on exam and will move toes on command. He drifts back off to sleep when not stimulated. Shakes head no when asked if he is in any pain.   Objective:  Vital signs in last 24 hours: Vitals:   02/15/19 0122 02/15/19 0406 02/15/19 0500 02/15/19 0741  BP: 119/74   113/69  Pulse: 86   (!) 132  Resp: 18   20  Temp: 98 F (36.7 C) 98.2 F (36.8 C)  99.7 F (37.6 C)  TempSrc: Oral Oral  Oral  SpO2: 96%   94%  Weight:   78.6 kg   Height:       Physical Exam Vitals signs reviewed.  Constitutional:      General: He is in acute distress.     Appearance: He is ill-appearing and toxic-appearing.  HENT:     Head: Normocephalic and atraumatic.     Mouth/Throat:     Mouth: Mucous membranes are dry.     Comments: NG tube appears to be kinked in mouth  Eyes:     General: No scleral icterus.       Right eye: No discharge.        Left eye: No discharge.     Pupils: Pupils are equal, round, and reactive to light.  Cardiovascular:     Rate and Rhythm: Regular rhythm. Tachycardia present.     Pulses: Normal pulses.     Heart sounds: Normal heart sounds. No murmur. No friction rub. No gallop.   Pulmonary:     Effort: Pulmonary effort is normal. No respiratory distress.     Breath sounds: Normal breath sounds. No wheezing or rales.  Abdominal:     General: Abdomen is flat. Bowel sounds are normal. There is no distension.     Palpations: Abdomen is soft.     Tenderness: There is no abdominal tenderness. There is no guarding.  Musculoskeletal:     Right lower leg: No edema.     Left lower leg: No edema.  Skin:    General: Skin is dry.  Neurological:     Mental Status: He is disoriented.     Comments: Does not follow commands, will open eyes to noxious stimuli but quickly falls back asleep.     Assessment/Plan:  Active Problems:   Sepsis ([place])   DKA (diabetic ketoacidosis) ([place])   Altered mental status   Protein-calorie malnutrition, severe  In  summary Mr. Atteberry is a 78 year old gentleman with a history ofmaleJehovah's Witness with a history of stroke in 2002 and 2015 withRsided deficits, CAD, COPD, dementia,HTN, T2DM and GERD who presentedto the ED after being found down at his residence for an unknown amount of time.Itappears that he hasseveral conditions going on at the same time contributing to AMS,including sepsis, DKA, and rhabdomyolysis. The patient spent a night in the MICU due to hemodynamic instability and has now been transferred back to the internal medicine teaching service for further treatment.  #Altered Mental Status #DKA: RESOLVED #Hypernatremia  #E. Coli Sepsis:The patient initially presented with a fever of 101.3, tachycardia and hypotensionand notable labs significant for leukocytosis,severe hyperglycemia (800s), andlarge anion gap of 28(now normalized)concerning for picture of sepsis. He was started on broad-spectrum antibiotics in the ED and givennearly 6L of fluidover the first 24 hrs.Vital signs did not imrpovemuchand he spent a night in the MICU for closer f/u. Started cefepime and flagyl for one dose but subsequently narrowed to cefazolin after sensitivity labs came back (7/23).His vitals  have been stabilized and has been afebrile. Continues to be tachycardic. Hypernatremic to 158 today, free water deficit calculated at 6.5L. -Continue Cefazolin for a total of 10 days (day 5/10) - Given bolus D5W 1L - Continuous maintence 14mL/hr infusion LR  - Free water flush 326mL q4hr - K phos repletion  - If AMS persists, will consider repeat head imaging tomorrow AM  #Pain: Per nursing today, the patient was able to say that he was in pain "all over". Consistently moaning, groaning and appears uncomfortable.  - Dilaudid IV 0.5 mg q4hr PRN - nurse was instructed to give if the patient appeared uncomfortable or if she thinks he is in pain.   #Rhabdomyolysis: Patient presented with a CK of  3041.Peaked at 3837, now 893.No need to continue trending at this point. -Continue IV fluids as mentioned above  #AKI:Baseline creatinine 1.0. Patient presented with a creatinine of 3.45, now 1.69. Renal injury likley multifactorial,secondary to sepsis, DKA and rhabdomyolysis. -Continue IV fluids as mentioned above -Daily BMP  #DVT prophylaxis -Lovenox 30 mg subq daily   #FEN/GI: #Hypernatremia:  - NPO, asked nurse to preposition NG tube. Will obtain KUB to confirm NG tube placement. -Continue supplemental tube feeds 375ml q 4 hrs for total of 1.8L over 24 hrs  - IV fluids as mentioned above.   #Code Status: Full   #Dispo: Family looking for SNF when the patient is medically ready for discharge. [facility].  Earlene Plater, MD Internal Medicine, PGY1 Pager: [phone]  02/15/2019,7:14 PM

## 2019-02-15 NOTE — Progress Notes (Signed)
PT Cancellation Note  Patient Details Name: Maurice Stone MRN: [id number] DOB: 19-Apr-1941   Cancelled Treatment:    Reason Eval/Treat Not Completed: Patient's level of consciousness    [name] 02/15/2019, 4:35 PM

## 2019-02-15 NOTE — Plan of Care (Signed)
  Problem: Clinical Measurements: Goal: Ability to maintain clinical measurements within normal limits will improve Outcome: Progressing   Problem: Nutrition: Goal: Adequate nutrition will be maintained Outcome: Progressing   Problem: Elimination: Goal: Will not experience complications related to urinary retention Outcome: Progressing   Problem: Safety: Goal: Ability to remain free from injury will improve Outcome: Progressing   Problem: Skin Integrity: Goal: Risk for impaired skin integrity will decrease Outcome: Progressing

## 2019-02-15 NOTE — Plan of Care (Signed)
  Problem: Health Behavior/Discharge Planning: Goal: Ability to manage health-related needs will improve Outcome: Not Progressing   Problem: Clinical Measurements: Goal: Ability to maintain clinical measurements within normal limits will improve Outcome: Not Progressing Goal: Will remain free from infection Outcome: Not Progressing Goal: Diagnostic test results will improve Outcome: Not Progressing  Patient is confused at this time. Follows simple commands.   Problem: Activity: Goal: Risk for activity intolerance will decrease Outcome: Not Progressing   Problem: Nutrition: Goal: Adequate nutrition will be maintained Outcome: Not Progressing   Problem: Elimination: Goal: Will not experience complications related to urinary retention Outcome: Not Progressing   Problem: Safety: Goal: Ability to remain free from injury will improve Outcome: Not Progressing   Problem: Skin Integrity: Goal: Risk for impaired skin integrity will decrease Outcome: Not Progressing

## 2019-02-15 NOTE — Progress Notes (Signed)
Inpatient Diabetes Program Recommendations  AACE/ADA: New Consensus Statement on Inpatient Glycemic Control (2015)  Target Ranges:  Prepandial:   less than 140 mg/dL      Peak postprandial:   less than 180 mg/dL (1-2 hours)      Critically ill patients:  140 - 180 mg/dL   Lab Results  Component Value Date   GLUCAP 216 (H) 02/15/2019   HGBA1C 13.7 (H) 01/29/2019    Review of Glycemic Control Results for DEZMOND, DOWNIE (MRN [id number]) as of 02/15/2019 11:01  Ref. Range 02/15/2019 00:03 02/15/2019 04:06 02/15/2019 07:39  Glucose-Capillary Latest Ref Range: 70 - 99 mg/dL 267 (H) 249 (H) 216 (H)   Diabetes history: Type 2 DM Outpatient Diabetes medications: Novolog 70/30 80 units BID, Metformin 500 mg BID, Semaglutide 1 mg Q/week Current orders for Inpatient glycemic control: Levemir 15 units QHS, Novolog 0-15 units Q4H  Jevity 65 ml/hr  Inpatient Diabetes Program Recommendations:    Noted that tube feeds were adjusted on 7/22, thus possibly explaining increases to glucose trends. Consider adding Novolog 4 units Q4H for tube feed coverage (to be stopped or held if tube feeds are stopped).   Thanks, Bronson Curb, MSN, RNC-OB Diabetes Coordinator [phone] (8a-5p)

## 2019-02-15 NOTE — Progress Notes (Addendum)
Nutrition Follow-up  RD working remotely.  DOCUMENTATION CODES:   Severe malnutrition in context of chronic illness  INTERVENTION:   -Continue Jevity 1.2 @20ml /hr via NGTand increase by 10 ml every 12hours to goal rate of 32ml/hr.   67ml Prostat daily.   Tube feeding regimen provides1972kcal (100% of needs),102grams of protein, and 1257ml of H2O.  -Continue 300 ml free water flush every 4 hours  -Monitor K, Mg, and Phos daily x 3 days and replete as necessary due to high refeeding risk  NUTRITION DIAGNOSIS:   Severe Malnutrition related to chronic illness(CVA, dementia) as evidenced by severe muscle depletion, severe fat depletion.  Ongoing  GOAL:   Patient will meet greater than or equal to 90% of their needs  Progressing   MONITOR:   Diet advancement, Labs, Weight trends, TF tolerance, Skin, I & O's  REASON FOR ASSESSMENT:   Consult Enteral/tube feeding initiation and management  ASSESSMENT:   Maurice Stone is a 78 year old male Jehovah's Witness with a history of stroke in 2002 and 2015 with R sided deficits, CAD, COPD, dementia, HTN, T2DM and GERD who presented to the ED after being found down at his residence for an unknown amount of time. Per the ED note, he has not been seen for the past 3 days. A friend found him on the ground only wearing his boxer briefs and was unresponsive. The friend who brought him in says that he is usually fully alert and oriented at his baseline.  7/21- NGT placed (tip placement confirmed in stomach) 7/22- s/p BSE- recommend continue NPO  Reviewed I/O's: +2.7 L x 24 hours and +7.7 L since admission  UOP: 1.5 L x 24 hours  Pt remains NPO.   TF now infusing via NGT at goal rate (Jevity 1.2 @ 65 ml/hr).   Labs reviewed: K and Mg WDL, Phos: 1.9 (on IV supplementation), Na: 158, CBGS: 216-249 (inpatient orders for glycemic control are 0-15 units insulin aspart every 4 hours, 15 units insulin detemir q HS).   Diet  Order:   Diet Order            Diet NPO time specified  Diet effective now              EDUCATION NEEDS:   No education needs have been identified at this time  Skin:  Skin Assessment: Skin Integrity Issues: Skin Integrity Issues:: Other (Comment) Other: bruises on upper back and coccyx  Last BM:  02/13/19  Height:   Ht Readings from Last 1 Encounters:  02/04/2019 5\' 11"  (1.803 m)    Weight:   Wt Readings from Last 1 Encounters:  02/15/19 78.6 kg    Ideal Body Weight:  78.2 kg  BMI:  Body mass index is 24.17 kg/m.  Estimated Nutritional Needs:   Kcal:  1800-2000  Protein:  100-115 grams  Fluid:  1.8-2.0 L    Labrittany Wechter A. Jimmye Norman, RD, LDN, [place] Registered Dietitian II Certified Diabetes Care and Education Specialist Pager: [phone] After hours Pager: [phone]

## 2019-02-15 NOTE — Progress Notes (Addendum)
1050: Daughter [name] at beside. Updated on condition. MD to come update as well.  1120: MD at bedside.  1229: TF restarted. Daughter gone for day.   1830: Condom cath removed. Penis head and around upper shaft slightly more excoriated. Barrier cream applied.   1833: Straight cath performed with Elisa RN at bedside. Call back to daughter to update on condition, no answer. VM left with CB number. Patient able to follow simple commands.

## 2019-02-15 NOTE — Progress Notes (Signed)
SLP Cancellation Note  Patient Details Name: Maurice Stone MRN: [id number] DOB: [birth year]   Cancelled treatment:       Reason Eval/Treat Not Completed: Patient's level of consciousness   Laure Leone, Katherene Ponto 02/15/2019, 2:55 PM

## 2019-02-16 ENCOUNTER — Inpatient Hospital Stay (HOSPITAL_COMMUNITY): Payer: Medicare Other

## 2019-02-16 DIAGNOSIS — R410 Disorientation, unspecified: Secondary | ICD-10-CM

## 2019-02-16 LAB — BASIC METABOLIC PANEL
Anion gap: 7 (ref 5–15)
BUN: 26 mg/dL — ABNORMAL HIGH (ref 8–23)
CO2: 25 mmol/L (ref 22–32)
Calcium: 8.1 mg/dL — ABNORMAL LOW (ref 8.9–10.3)
Chloride: 122 mmol/L — ABNORMAL HIGH (ref 98–111)
Creatinine, Ser: 1.68 mg/dL — ABNORMAL HIGH (ref 0.61–1.24)
GFR calc Af Amer: 45 mL/min — ABNORMAL LOW (ref >60)
GFR calc non Af Amer: 39 mL/min — ABNORMAL LOW (ref >60)
Glucose, Bld: 292 mg/dL — ABNORMAL HIGH (ref 70–99)
Potassium: 3.8 mmol/L (ref 3.5–5.1)
Sodium: 154 mmol/L — ABNORMAL HIGH (ref 135–145)

## 2019-02-16 LAB — BLOOD GAS, ARTERIAL
Acid-Base Excess: 1.9 mmol/L (ref 0.0–2.0)
Bicarbonate: 26.6 mmol/L (ref 20.0–28.0)
O2 Content: 15 L/min
O2 Saturation: 90.9 %
Patient temperature: 98.6
pCO2 arterial: 46.7 mmHg (ref 32.0–48.0)
pH, Arterial: 7.374 (ref 7.350–7.450)
pO2, Arterial: 60.8 mmHg — ABNORMAL LOW (ref 83.0–108.0)

## 2019-02-16 LAB — CBC
HCT: 33.3 % — ABNORMAL LOW (ref 39.0–52.0)
Hemoglobin: 10.2 g/dL — ABNORMAL LOW (ref 13.0–17.0)
MCH: 28.2 pg (ref 26.0–34.0)
MCHC: 30.6 g/dL (ref 30.0–36.0)
MCV: 92 fL (ref 80.0–100.0)
Platelets: 97 10*3/uL — ABNORMAL LOW (ref 150–400)
RBC: 3.62 MIL/uL — ABNORMAL LOW (ref 4.22–5.81)
RDW: 16.1 % — ABNORMAL HIGH (ref 11.5–15.5)
WBC: 12.9 10*3/uL — ABNORMAL HIGH (ref 4.0–10.5)
nRBC: 0.3 % — ABNORMAL HIGH (ref 0.0–0.2)

## 2019-02-16 LAB — MAGNESIUM: Magnesium: 1.9 mg/dL (ref 1.7–2.4)

## 2019-02-16 LAB — GLUCOSE, CAPILLARY
Glucose-Capillary: 182 mg/dL — ABNORMAL HIGH (ref 70–99)
Glucose-Capillary: 208 mg/dL — ABNORMAL HIGH (ref 70–99)
Glucose-Capillary: 255 mg/dL — ABNORMAL HIGH (ref 70–99)
Glucose-Capillary: 259 mg/dL — ABNORMAL HIGH (ref 70–99)
Glucose-Capillary: 267 mg/dL — ABNORMAL HIGH (ref 70–99)
Glucose-Capillary: 279 mg/dL — ABNORMAL HIGH (ref 70–99)
Glucose-Capillary: 309 mg/dL — ABNORMAL HIGH (ref 70–99)

## 2019-02-16 LAB — PHOSPHORUS: Phosphorus: 2.6 mg/dL (ref 2.5–4.6)

## 2019-02-16 LAB — MRSA PCR SCREENING: MRSA by PCR: NEGATIVE

## 2019-02-16 LAB — LACTIC ACID, PLASMA: Lactic Acid, Venous: 2.2 mmol/L (ref 0.5–1.9)

## 2019-02-16 LAB — PROCALCITONIN: Procalcitonin: 6.46 ng/mL

## 2019-02-16 LAB — SARS CORONAVIRUS 2 BY RT PCR (HOSPITAL ORDER, PERFORMED IN ~~LOC~~ HOSPITAL LAB): SARS Coronavirus 2: NEGATIVE

## 2019-02-16 LAB — TECHNOLOGIST SMEAR REVIEW: Plt Morphology: INCREASED

## 2019-02-16 MED ORDER — HEPARIN SODIUM (PORCINE) 5000 UNIT/ML IJ SOLN
5000.0000 [IU] | Freq: Three times a day (TID) | INTRAMUSCULAR | Status: DC
  Administered 2019-02-16 – 2019-03-01 (×38): 5000 [IU] via SUBCUTANEOUS
  Filled 2019-02-16 (×35): qty 1

## 2019-02-16 MED ORDER — ADULT MULTIVITAMIN W/MINERALS CH: 1.0000 | ORAL_TABLET | Freq: Every day | ORAL | Status: DC

## 2019-02-16 MED ORDER — SODIUM CHLORIDE 0.9 % IV SOLN
2.0000 g | INTRAVENOUS | Status: DC
  Administered 2019-02-16: 2 g via INTRAVENOUS
  Filled 2019-02-16: qty 20

## 2019-02-16 MED ORDER — ACETAMINOPHEN 160 MG/5ML PO SOLN
650.0000 mg | ORAL | Status: DC | PRN
  Administered 2019-02-16 – 2019-02-17 (×3): 650 mg via NASOGASTRIC
  Filled 2019-02-16 (×3): qty 20.3

## 2019-02-16 MED ORDER — FENTANYL BOLUS VIA INFUSION
25.0000 ug | INTRAVENOUS | Status: DC | PRN
  Administered 2019-02-17 – 2019-02-18 (×3): 25 ug via INTRAVENOUS
  Filled 2019-02-16: qty 25

## 2019-02-16 MED ORDER — PROPOFOL 1000 MG/100ML IV EMUL
0.0000 ug/kg/min | INTRAVENOUS | Status: DC
  Administered 2019-02-16 – 2019-02-18 (×3): 10 ug/kg/min via INTRAVENOUS
  Filled 2019-02-16 (×3): qty 100

## 2019-02-16 MED ORDER — IOHEXOL 300 MG/ML  SOLN
80.0000 mL | Freq: Once | INTRAMUSCULAR | Status: AC | PRN
  Administered 2019-02-16: 80 mL via INTRAVENOUS

## 2019-02-16 MED ORDER — VANCOMYCIN HCL IN DEXTROSE 1-5 GM/200ML-% IV SOLN: 1000.0000 mg | INTRAVENOUS | Status: DC

## 2019-02-16 MED ORDER — FENTANYL 2500MCG IN NS 250ML (10MCG/ML) PREMIX INFUSION
0.0000 ug/h | INTRAVENOUS | Status: DC
  Administered 2019-02-16: 25 ug/h via INTRAVENOUS
  Filled 2019-02-16: qty 250

## 2019-02-16 MED ORDER — POLYETHYLENE GLYCOL 3350 17 G PO PACK: 17.0000 g | PACK | Freq: Every day | ORAL | Status: DC | PRN

## 2019-02-16 MED ORDER — FENTANYL 2500MCG IN NS 250ML (10MCG/ML) PREMIX INFUSION: 25.0000 ug/h | INTRAVENOUS | Status: DC

## 2019-02-16 MED ORDER — FENTANYL CITRATE (PF) 100 MCG/2ML IJ SOLN
INTRAMUSCULAR | Status: AC
  Administered 2019-02-16: 100 ug
  Filled 2019-02-16: qty 2

## 2019-02-16 MED ORDER — SODIUM CHLORIDE 0.9 % IV SOLN
2.0000 g | Freq: Two times a day (BID) | INTRAVENOUS | Status: DC
  Administered 2019-02-16 – 2019-02-24 (×16): 2 g via INTRAVENOUS
  Filled 2019-02-16 (×16): qty 2

## 2019-02-16 MED ORDER — SODIUM CHLORIDE 0.45 % IV BOLUS: 1000.0000 mL | Freq: Once | INTRAVENOUS | Status: DC

## 2019-02-16 MED ORDER — FENTANYL CITRATE (PF) 100 MCG/2ML IJ SOLN: 25.0000 ug | Freq: Once | INTRAMUSCULAR | Status: DC

## 2019-02-16 MED ORDER — ORAL CARE MOUTH RINSE
15.0000 mL | OROMUCOSAL | Status: DC
  Administered 2019-02-16 – 2019-02-25 (×84): 15 mL via OROMUCOSAL

## 2019-02-16 MED ORDER — VANCOMYCIN HCL 10 G IV SOLR
1500.0000 mg | Freq: Once | INTRAVENOUS | Status: AC
  Administered 2019-02-16: 1500 mg via INTRAVENOUS
  Filled 2019-02-16: qty 1500

## 2019-02-16 MED ORDER — MIDAZOLAM HCL 2 MG/2ML IJ SOLN
INTRAMUSCULAR | Status: AC
  Administered 2019-02-16: 2 mg
  Filled 2019-02-16: qty 2

## 2019-02-16 MED ORDER — CHLORHEXIDINE GLUCONATE 0.12% ORAL RINSE (MEDLINE KIT)
15.0000 mL | Freq: Two times a day (BID) | OROMUCOSAL | Status: DC
  Administered 2019-02-16 – 2019-02-24 (×17): 15 mL via OROMUCOSAL

## 2019-02-16 NOTE — Progress Notes (Signed)
PT Cancellation Note  Patient Details Name: Maurice Stone MRN: [id number] DOB: 12-26-1940   Cancelled Treatment:    Reason Eval/Treat Not Completed: Patient's level of consciousness PT spoke with RN who recommends holding therapy at this time due to pt's level of arousal. Will attempt PT later as appropriate  [name] 02/16/2019, 9:06 AM

## 2019-02-16 NOTE — Progress Notes (Signed)
Pharmacy Antibiotic Note  Maurice Stone is a 78 y.o. male admitted on 01/29/2019 with e.coli bacteremia. Patient with persistent fevers, and possible lung changes on xray. MD wishes to broaden coverage.   Pharmacy has been consulted for Vancomycin dosing.  Plan: Vancomycin 1500mg  IV x 1, then 1000mg  IV q24h Estimated AUC 489 Scr used 1.68 Ceftriaxone 2gm IV q24h per MD  Height: 5\' 11"  (180.3 cm) Weight: 167 lb (75.8 kg) IBW/kg (Calculated) : 75.3  Temp (24hrs), Avg:99.5 F (37.5 C), Min:97.4 F (36.3 C), Max:102.9 F (39.4 C)  Recent Labs  Lab 02/03/2019 1458  02/20/2019 2058 02/12/19 0411  02/12/19 0814 02/12/19 1044 02/12/19 1058 02/12/19 1347 02/12/19 2022 02/13/19 0320 02/14/19 0337 02/15/19 0214 02/16/19 0342  WBC 18.0*  --   --   --   --   --  14.3*  --   --   --  8.3 8.2 6.3  --   CREATININE 3.45*   < > 2.80* 2.51*   < >  --  2.58*  --  2.52* 2.36* 2.22* 1.79* 1.69* 1.68*  LATICACIDVEN  --    < > 4.3* 3.0*  --  3.7*  --  3.8* 2.6*  --   --   --   --   --    < > = values in this interval not displayed.    Estimated Creatinine Clearance: 39.8 mL/min (A) (by C-G formula based on SCr of 1.68 mg/dL (H)).    Allergies  Allergen Reactions  . Metformin And Related Other (See Comments)    unspecified  . Other Other (See Comments)    Nonsteroidal anti-inflammatory - unspecified  . Lisinopril Cough    Antimicrobials this admission: Cefepime 7/19 >>7/20 Vanc 7/19 >> 7/19, 7/24>> CTX 7/20 >>7/23. 7/24>> Cefazolin 7/23>.7/24   Microbiology results: 7/19 BCx:E.coli 7/19 UCx: sent  7/19 COVID PCR: neg  Maurice Stone A. Levada Dy, PharmD, [place] Pager: [phone] Please utilize Amion for appropriate phone number to reach the unit pharmacist ([place])    02/16/2019 2:18 PM

## 2019-02-16 NOTE — Progress Notes (Signed)
  Speech Language Pathology Treatment: Dysphagia  Patient Details Name: Maurice Stone MRN: [id number] DOB: 1941-04-25 Today's Date: 02/16/2019 Time: 2947-6546 SLP Time Calculation (min) (ACUTE ONLY): 15 min  Assessment / Plan / Recommendation Clinical Impression  Patient seen to address dysphagia goals and determine readiness for PO's. His oral mucosa and cavity is looking much better today as compared to initial evaluation,and although SLP suctioned and removed thick secretions from throughout oral cavity, amount was significantly decreased. Patient was alert with eyes open and did respond to basic questions when given choices (ie at end of session, he chose "to get some rest"). During oral care, patient was compliant but started to grimace and appear to be uncomfortable and so SLP ceased and switched to offering patient toothette sponges soaked with water, or teaspoon sips of water. He exhibited oral holding, anterior oral spillage/leakage, delayed swallow, decreased pharyngeal contraction and laryngeal elevation and throat clearing.     HPI HPI: Patient is a 78 y.o. male with PMH: COPD, DM, gastritis, HTN, HLD, anxiety, chronic pain, ongoing dysphagia with a recent h/o dyspnea and productive coughing, who was admitted to hospital with worsening of his symptoms. CXR did not reveal pulmonary edema or evidence of PNA. Per MD report, patient has been taking his inhalers incorrectly at home, likely due to poor cognitive ability.      SLP Plan  Continue with current plan of care       Recommendations  Diet recommendations: NPO Medication Administration: Via alternative means                Oral Care Recommendations: Oral care QID Follow up Recommendations: Other (comment);24 hour supervision/assistance;Skilled Nursing facility(pending progress) SLP Visit Diagnosis: Dysphagia, oropharyngeal phase (R13.12) Plan: Continue with current plan of care       GO                 Dannial Monarch 02/16/2019, 4:43 PM   Sonia Baller, MA, [place] [facility] Acute Rehab Pager: [phone]

## 2019-02-16 NOTE — Procedures (Signed)
Intubation Procedure Note Maurice Stone [id number] [birth year]  Procedure: Intubation Indications: Respiratory insufficiency  Procedure Details Consent: Unable to obtain consent because of emergent medical necessity. Time Out: Verified patient identification, verified procedure, site/side was marked, verified correct patient position, special equipment/implants available, medications/allergies/relevent history reviewed, required imaging and test results available.  Performed  Maximum sterile technique was used including gloves, hand hygiene and mask. Trachea intubated with 8.0 ETT utilizing Glidescope LowPro 3 blade.      Evaluation Hemodynamic Status: BP stable throughout; O2 sats: stable throughout Patient's Current Condition: stable Complications: No apparent complications Patient did tolerate procedure well. Chest X-ray ordered to verify placement.  CXR: pending.   [name] [name] 02/16/2019

## 2019-02-16 NOTE — Progress Notes (Addendum)
Pharmacy Antibiotic Note  Maurice Stone is a 78 y.o. male admitted on 01/31/2019 with e.coli bacteremia. Patient with persistent fevers, and possible lung changes on xray. MD wishes to broaden coverage.   Pharmacy has been consulted for Vancomycin dosing - then d/c'd.  This evening pharmacy asked to add cefepime for worsening respiratory status.  Plan: Cefepime 2g q 12 hrs F/u cultures, renal function and clinical course.  Height: 5\' 11"  (180.3 cm) Weight: 167 lb (75.8 kg) IBW/kg (Calculated) : 75.3  Temp (24hrs), Avg:99.6 F (37.6 C), Min:97.4 F (36.3 C), Max:102.9 F (39.4 C)  Recent Labs  Lab 02/01/2019 2058 02/12/19 0411  02/12/19 0814 02/12/19 1044 02/12/19 1058 02/12/19 1347 02/12/19 2022 02/13/19 0320 02/14/19 0337 02/15/19 0214 02/16/19 0342 02/16/19 1550  WBC  --   --   --   --  14.3*  --   --   --  8.3 8.2 6.3  --  12.9*  CREATININE 2.80* 2.51*   < >  --  2.58*  --  2.52* 2.36* 2.22* 1.79* 1.69* 1.68*  --   LATICACIDVEN 4.3* 3.0*  --  3.7*  --  3.8* 2.6*  --   --   --   --   --   --    < > = values in this interval not displayed.    Estimated Creatinine Clearance: 39.8 mL/min (A) (by C-G formula based on SCr of 1.68 mg/dL (H)).    Allergies  Allergen Reactions  . Metformin And Related Other (See Comments)    unspecified  . Other Other (See Comments)    Nonsteroidal anti-inflammatory - unspecified  . Lisinopril Cough    Antimicrobials this admission: Cefepime 7/19 >>7/20, 7/24 >  Vanc 7/19 >> 7/19, 7/24>>7/24 CTX 7/20 >>7/23. 7/24>>7/24 Cefazolin 7/23>7/24   Microbiology results: 7/19 BCx:E.coli 7/19 UCx: sent  7/19 COVID PCR: neg  Marguerite Olea, [facility] Clinical Pharmacist Phone [phone]  02/16/2019 6:20 PM      02/16/2019 6:18 PM

## 2019-02-16 NOTE — Progress Notes (Signed)
Inpatient Diabetes Program Recommendations  AACE/ADA: New Consensus Statement on Inpatient Glycemic Control (2015)  Target Ranges:  Prepandial:   less than 140 mg/dL      Peak postprandial:   less than 180 mg/dL (1-2 hours)      Critically ill patients:  140 - 180 mg/dL   Lab Results  Component Value Date   GLUCAP 267 (H) 02/16/2019   HGBA1C 13.7 (H) 02/14/2019    Review of Glycemic Control Results for ALIF, PETRAK (MRN [id number]) as of 02/16/2019 09:20  Ref. Range 02/15/2019 20:25 02/15/2019 23:59 02/16/2019 04:13 02/16/2019 07:40  Glucose-Capillary Latest Ref Range: 70 - 99 mg/dL 366 (H) 259 (H) 279 (H) 267 (H)   Diabetes history: Type 2 DM Outpatient Diabetes medications: Novolog 70/30 80 units BID, Metformin 500 mg BID, Semaglutide 1 mg Q/week Current orders for Inpatient glycemic control: Levemir 15 units QHS, Novolog 0-15 units Q4H  Jevity 65 ml/hr  Inpatient Diabetes Program Recommendations:    Noted that tube feeds were adjusted on 7/22, thus possibly explaining increases to glucose trends. Consider adding Novolog 5 units Q4H for tube feed coverage (to be stopped or held if tube feeds are stopped).   Thanks, Bronson Curb, MSN, RNC-[facility] Diabetes Coordinator [phone] (8a-5p)

## 2019-02-16 NOTE — Significant Event (Signed)
Rapid Response Event Note  Overview:  Called to floor by bedside nurse.  Patient has required more O2 to maintain saturation.  This afternoon patient was on 4L [place] and upon arrival he was on 14L. Time Called: 1610 Arrival Time: 1648 Event Type: Respiratory  Initial Focused Assessment:  RR responded to page from floor nurse.  RN reported that patient is hypoxic, lung crackles, and requiring more O2.  Upon entering room I noted that the patient is on a high flow nasal canula at 14L/min.  Lungs have crackles in all bases upon ascultation.  Tube feeding stopped and NG tube connected to wall suction.  150 mL was removed from patient's stomach.  Blood gas was obtained and the results are as follows 7.37 pH, 47 CO2, pO2 61, and HCO3 27 BP 155/78 Sinus Tach in low 100s O2 saturation 88-92 on 15 L  CCM was consulted for probable need of transfer to a higher level of care.     Interventions: ABG, repositioned patient, NT suctioned patient, tube feeding stopped, NG tube connected to suction, and transferred patient to 34M.  Plan of Care (if not transferred): Patient transferred to 34M and intubated by CCM>  Event Summary: Name of Physician Notified: Neva Seat MD at 1648    at    Outcome: Transferred to 34M ICU and intubated.     Venetia Maxon

## 2019-02-16 NOTE — Progress Notes (Addendum)
Subjective: Pt seen on rounds at the bedside today. I'll appearing, but alert. Sweaty forehead and was hot to the touch. Increased O2 requirement to 10L from 4L yesterday.   Objective:  Vital signs in last 24 hours: Vitals:   02/16/19 1055 02/16/19 1125 02/16/19 1155 02/16/19 1234  BP: (!) 153/61 (!) 136/57 (!) 155/58   Pulse: 85 68 74   Resp: (!) 24 (!) 23 14   Temp:   99.3 F (37.4 C) 98.7 F (37.1 C)  TempSrc:    Axillary  SpO2: 91% 92% 91%   Weight:      Height:       Physical Exam Vitals signs reviewed.  Constitutional:      General: He is in acute distress.     Appearance: He is ill-appearing, toxic-appearing and diaphoretic.  HENT:     Head: Normocephalic and atraumatic.  Eyes:     General: No scleral icterus.       Right eye: No discharge.        Left eye: No discharge.     Pupils: Pupils are equal, round, and reactive to light.  Cardiovascular:     Rate and Rhythm: Regular rhythm. Tachycardia present.     Pulses: Normal pulses.     Heart sounds: Normal heart sounds. No murmur. No friction rub. No gallop.   Pulmonary:     Effort: Respiratory distress present.     Breath sounds: Rales present.     Comments: Noisy right-sided crackles appreciated on the front of chest with inspirations. Abdominal:     General: Abdomen is flat. Bowel sounds are normal. There is no distension.     Palpations: Abdomen is soft.     Tenderness: There is abdominal tenderness (Wincing upon light and deep palpation of the abdomen diffusely). There is guarding.  Musculoskeletal:        General: No swelling or deformity.  Skin:    Comments: Damp, warm to the touch   Neurological:     Mental Status: He is alert. He is disoriented.  Psychiatric:     Comments: unable to access     Assessment/Plan:  Active Problems:   Sepsis ([place])   DKA (diabetic ketoacidosis) ([place])   Altered mental status   Protein-calorie malnutrition, severe  In summary Maurice Stone is a 78 year old gentleman  with a history ofmaleJehovah's Witness with a history of stroke in 2002 and 2015 withRsided deficits, CAD, COPD, dementia,HTN, T2DM and GERD who presentedto the ED after being found down at his residence for an unknown amount of time.Itappears that he hasseveral conditions going on at the same time contributing to AMS,including sepsis, DKA, and rhabdomyolysis.The patient spent a night in the MICU due to hemodynamic instability and has now been transferred back to the internal medicine teaching service for further treatment.  Over night the patient spiked a fever of 102.9 and became tachycardic to 130s. Despite being on the ceftriaxone, the patient's infectious status does not seem to be improving.  #Altered Mental Status #DKA: RESOLVED #Hypernatremia  #E. ColiSepsis:The patient initially presented with a fever of 101.3, tachycardia and hypotensionand notable labs significant for leukocytosis,severe hyperglycemia (800s), andlarge anion gap of 28(now normalized)concerning for picture of sepsis. He was started on broad-spectrum antibiotics in the ED and givennearly6L of fluidover the first 24 hrs.Vital signsdid not imrpovemuchand he spent a night in the MICU for closer f/u. Started cefepime and flagyl for one dose but subsequently narrowed to cefazolin after sensitivity labs came back (7/23).However this  AM, the patient spiked a fever, became tachycardic to the 130s and has new multifocal infiltrate on CXR in addition to crackles on auscultation of his lungs. He also has increased O2 requirement to 10L from 4L yesterday. Hypernatremic to 154 today, free water deficit calculated at 2L.  -Change abx regimen to Vancomycin + Ceftriaxone, given recent fever spike, tachycardia and new infiltrate on repeat CXR.  (day 6 of abx) - ordered COVID test - contact precuations - Call MICU to let them know of worsening status - Obtain CT Head due to persistent AMS - Obtain CT abdomen & pelvis w/  contrast in search of potential infectious source -Continuous maintence 176mL/hr infusion LR  - Free water flush 32mL q4hr  #Pain: Per nursing today, the patient was able to say that he was in pain "all over". Consistently moaning, groaning and appears uncomfortable. -Dilaudid IV0.5 mg q4hr PRN - nurse was instructed to give if the patient appeared uncomfortable or if she thinks he is in pain.   #Rhabdomyolysis: Patient presented with a CK of 3041.Peaked at 3837, now 893.No need to continue trending at this point. -Continue IV fluids as mentioned above  #AKI:Baseline creatinine 1.0. Patient presented with a creatinine of 3.45, now1.68. Renal injury likley multifactorial,secondary to sepsis, DKA and rhabdomyolysis. -Continue IV fluids as mentioned above -Daily BMP  #DVT prophylaxis -Lovenox 30 mg subq daily   #FEN/GI: #Hypernatremia: -NPO, NG tube placed -Continue supplemental tube feedsper nutrition recs -IV fluids as mentioned above.  #Code Status: Full   #Dispo: Family looking for SNF when the patient is medically ready for discharge. [facility].  Earlene Plater, MD Internal Medicine, PGY1 Pager: [phone]  02/16/2019,2:03 PM

## 2019-02-16 NOTE — Procedures (Deleted)
Intubation Procedure Note Maurice Stone [id number] [birth year]  Procedure: Intubation Indications: Respiratory insufficiency  Assistant: Francine Graven, NP   Procedure Details Consent: Unable to obtain consent because of emergent medical necessity. Time Out: Verified patient identification, verified procedure, site/side was marked, verified correct patient position, special equipment/implants available, medications/allergies/relevent history reviewed, required imaging and test results available.  Performed  Maximum sterile technique was used including gloves, gown, hand hygiene and mask.  MAC 3 gildescope  Grade 1 VC view Sedated with fentanyl 144mg, versed 247m Etomidate 2077mRocuronium, 50 mcg No difficulty with passing the 7.5 ETT  Evaluation Hemodynamic Status: BP stable throughout; O2 sats: stable throughout Patient's Current Condition: stable Complications: No apparent complications Patient did tolerate procedure well. Chest X-ray ordered to verify placement.  CXR: pending.   BraOctavio Gravesard 02/16/2019

## 2019-02-16 NOTE — Progress Notes (Addendum)
15: Page to MD. Patient is febrile, no PRN.  6067: Second page to MD. Awaiting CB.   1200: Verbal okay to insert foley, patient has breakdown from previous condom caths. MD to put order in. Foley inserted with Maurice Stone at bedside. Yellow urine with sediment noted. MD informed of blister that is now open on coccyx.  1235: Page to MD. Patient BG continues to slightly increase.   1240: Spoke with daughter Maurice Stone. Says she is to be primary contact for patient.   1412: Call to MD about O2 sats and BP  1450: Patient down to CT.  1645: Call to RRN. Patient sats 88%. Increased O2 to 15L HF. Sats remaining 88%. Patient overall looks unwell in comparison to this morning.   1745: Report given to [employer].  1750: Call to daughter Maurice Stone. Updated on condition.

## 2019-02-16 NOTE — Progress Notes (Addendum)
I went re-evaluate patient this afternoon given his worsening respiratory status with increased oxygen demand today. Repeat CXR earlier in the day, in the setting of new fevers, showed new multifocal PNA. He was started on Vanc and Repeat MRSA swab was ordered. He was also re-tested for COVID-19, which was negative.  Upon my arrival to the room, rapid response was present. Patient had further decompensated and was saturating in the high 80s to low 90s on 15L by nonrebreather. He was also tachypnic and mildly tachycardic. He remains encephalopathic and would not be able to tolerate Bi-PAP. I contacted critical care who agreed to see the patient and transfer him to the ICU due to his deterioration and expectation that he will require intubation.  I updated his daughter, Santiago Glad, by phone and she reconfirmed that patient remains full code at this time. She was also informed of incidental finding of possible multiple myeloma vs Metastasis on recent imaging that will need to be further discussed in the future. Today's Vitals   02/16/19 1437 02/16/19 1557 02/16/19 1609 02/16/19 1714  BP:   (!) 155/78   Pulse:   93 (!) 103  Resp:   17 (!) 21  Temp:  98.4 F (36.9 C) 99.4 F (37.4 C) 100.1 F (37.8 C)  TempSrc:  Axillary Axillary   SpO2:   93% 92%  Weight:      Height:      PainSc: Asleep      Body mass index is 23.29 kg/m.  - Appreciate critical consult and ICU care - Transfer patient to the ICU - ABG to facilitate evaluation of respiratory status - Repeat blood cultures pending - MRSA swab pending

## 2019-02-16 NOTE — Progress Notes (Signed)
Patient came as rapid response from 5W.  Patient transferred to ICU bed and stabilized.  Dr. Valeta Harms and Francine Graven, NP at bedside, patient intubated.  VSS.  Will continue to monitor.

## 2019-02-16 NOTE — Progress Notes (Signed)
  Date: 02/16/2019  Patient name: Maurice Stone  Medical record number: [id number]  Date of birth: 05-31-1941   I have seen and evaluated this patient and I have discussed the plan of care with the house staff. Please see Dr. Redgie Grayer note for complete details. I concur with his findings and plan.  After our visit, patient continued to do worse and he was transferred to the ICU.  Family was updated by the resident team.     Sid Falcon, MD 02/16/2019, 7:42 PM

## 2019-02-16 NOTE — Progress Notes (Signed)
NAME:  Maurice Stone, MRN:  [id number], DOB:  Jun 30, 1941, LOS: 5 ADMISSION DATE:  02/13/2019, CONSULTATION DATE:  7/20 REFERRING MD:  Daryll Drown, CHIEF COMPLAINT:  Hypotension   Brief History   78 year old Jehovah's Witness with multiple medical problems who lives by himself at home.  Found down by a friend.  Admitted 7/19 with DKA, rhabdomyolysis, and sepsis.  Found to have E. coli bacteremia and subsequently developed hypotension and alteration in mental status on 7/20, for which PCCM was consulted.  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  He reportedly lives at home by himself where he was found down by a friend after not having been seen for 3 days.  He is fully alert and oriented at baseline.  Upon arrival to the emergency department 7/19 he was found to be minimally responsive with fever, tachycardia, and hypotension.  He was given 3.5 L of normal saline with improvement in blood pressure and heart rate.  He was started on insulin infusion for DKA and antibiotics for presumed sepsis.  Laboratory evaluation in the emergency department significant for creatinine 3.45 and creatinine kinase of 3041.  Then in the morning hours of 7/20 hypotension returned and he remained poorly responsive.  Blood cultures became positive for E. coli.  Fevers also continued to climb.  PCCM was consulted.  Past Medical History   has a past medical history of Arthritis, COPD (chronic obstructive pulmonary disease) ([place]), Coronary artery disease, Dementia ([place]), GERD (gastroesophageal reflux disease), Hypertension, Refusal of blood transfusions as patient is Jehovah's Witness, Stroke ([place]) (2002; 2015), and Type II diabetes mellitus ([place]).   Significant Hospital Events   7/19 admit 7/21 transferred out of ICU 7/24 increasing O2 requirements,  probable aspiration, transferred back to ICU  Consults:  PCCM  Procedures:  ETT 7/24 >   Significant Diagnostic Tests:  [facility] 7/19 > No acute intracranial abnormality. Stable moderate generalized atrophy and mild chronic microvascular ischemic changes of the white matter. Remote lacunar strokes in the left basal ganglia. CT head 7/24 > negative. CT A / P 7/24 > multiple small lucencies throughout visualized spine and pelvis concerning for multiple myeloma or metastatic disease.  Large lucencies in both femurs.  Possible right pyelonephritis.  RML and RML opacities concerning for PNA.  Micro Data:  COVD 7/19 > Neg BC 7/19 > E. Coli (BCID) >>> Urine 7/19 >>> E.coli  Antimicrobials:  Vancomycin 7/19 Flagyl 7/20 > 7/20 Cefepime 7/19 >>>  Interim history/subjective:  Called back for increasing O2 requirements after probable aspiration of tube feeds. Paradoxical breathing pattern on exam.  Objective   Blood pressure (!) 155/78, pulse (!) 103, temperature 100.1 F (37.8 C), resp. rate (!) 21, height _0  (1.803 m), weight 75.8 kg, SpO2 92 %.        Intake/Output Summary (Last 24 hours) at 02/16/2019 1744 Last data filed at 02/16/2019 1556 Gross per 24 hour  Intake 4945.85 ml  Output 2250 ml  Net 2695.85 ml   Filed Weights   02/14/19 0500 02/15/19 0500 02/16/19 0500  Weight: 76.8 kg 78.6 kg 75.8 kg    Examination: General: Elderly male, chronically ill appearing, in distress. Neuro: Eyes open but does not follow commands. HEENT: [name]/AT. Sclerae anicteric. Thick oral secretions. Cardiovascular: RRR, 2/6 SEM.  Lungs: Respirations labored.  Coarse bilaterally. Abdomen: BS x 4, soft, NT/ND.  Musculoskeletal: No gross deformities, no edema.  Skin: Intact, warm, no rashes.   Assessment & Plan:   Acute hypoxic respiratory failure - presumed 2/2 aspiration of TF's. - Intubate. - Bronchial hygiene. - Change from ceftriaxone to cefepime. - Hold TF's. - Follow CXR.  Septic  from E coli UTI and bacteremia with concern for right pyelo. - Change from ceftriaxone to cefepime. - Follow repeat cultures.  Acute encephalopathy - due to above. - Supportive care as above.  AKI - improving. Rhabdo - improving. - Continue fluids. - Follow BMP.  Concern for multiple myeloma vs metastatic disease - multiple bony lucencies noted on CT A / P. - Send SPEP / UPEP, peripheral smear. - Consult oncology in AM. - Defer further imaging to oncology.  Generalized deconditioning. - PT / OT recommending SNF.  Best practice:  Diet: TF on hold. Pain/Anxiety/Delirium protocol (if indicated): Fentanyl PRN / Midazolam PRN.  RASS goal 0 to -1. VAP protocol (if indicated): In place. DVT prophylaxis: PPI. GI prophylaxis: SCD's / Heparin. Glucose control: SSI.  Hold levemir while tube feeds on hold. Mobility: Bedrest. Code Status: Full. Family Communication: Daughter updated by IMTS resident.  Will need further discussions pending multiple myeloma workup / oncology input. Disposition: ICU.   CC time: 40 min.   Montey Hora, [facility] Pager: [phone].  If no answer, [phone] 02/16/2019, 6:18 PM

## 2019-02-17 DIAGNOSIS — Z7189 Other specified counseling: Secondary | ICD-10-CM

## 2019-02-17 DIAGNOSIS — Z515 Encounter for palliative care: Secondary | ICD-10-CM

## 2019-02-17 DIAGNOSIS — L8990 Pressure ulcer of unspecified site, unspecified stage: Secondary | ICD-10-CM | POA: Insufficient documentation

## 2019-02-17 LAB — URINALYSIS, ROUTINE W REFLEX MICROSCOPIC
Bilirubin Urine: NEGATIVE
Glucose, UA: 150 mg/dL — AB
Ketones, ur: NEGATIVE mg/dL
Nitrite: NEGATIVE
Protein, ur: 100 mg/dL — AB
Specific Gravity, Urine: 1.028 (ref 1.005–1.030)
WBC, UA: >50 WBC/hpf — ABNORMAL HIGH (ref 0–5)
pH: 6 (ref 5.0–8.0)

## 2019-02-17 LAB — CBC
HCT: 29.7 % — ABNORMAL LOW (ref 39.0–52.0)
Hemoglobin: 9.3 g/dL — ABNORMAL LOW (ref 13.0–17.0)
MCH: 28.4 pg (ref 26.0–34.0)
MCHC: 31.3 g/dL (ref 30.0–36.0)
MCV: 90.8 fL (ref 80.0–100.0)
Platelets: 111 10*3/uL — ABNORMAL LOW (ref 150–400)
RBC: 3.27 MIL/uL — ABNORMAL LOW (ref 4.22–5.81)
RDW: 16.2 % — ABNORMAL HIGH (ref 11.5–15.5)
WBC: 14.2 10*3/uL — ABNORMAL HIGH (ref 4.0–10.5)
nRBC: 0.3 % — ABNORMAL HIGH (ref 0.0–0.2)

## 2019-02-17 LAB — GLUCOSE, CAPILLARY
Glucose-Capillary: 210 mg/dL — ABNORMAL HIGH (ref 70–99)
Glucose-Capillary: 215 mg/dL — ABNORMAL HIGH (ref 70–99)
Glucose-Capillary: 220 mg/dL — ABNORMAL HIGH (ref 70–99)
Glucose-Capillary: 233 mg/dL — ABNORMAL HIGH (ref 70–99)
Glucose-Capillary: 244 mg/dL — ABNORMAL HIGH (ref 70–99)
Glucose-Capillary: 265 mg/dL — ABNORMAL HIGH (ref 70–99)

## 2019-02-17 LAB — POCT I-STAT 7, (LYTES, BLD GAS, ICA,H+H)
Acid-Base Excess: 2 mmol/L (ref 0.0–2.0)
Bicarbonate: 27.8 mmol/L (ref 20.0–28.0)
Calcium, Ion: 1.17 mmol/L (ref 1.15–1.40)
HCT: 48 % (ref 39.0–52.0)
Hemoglobin: 16.3 g/dL (ref 13.0–17.0)
O2 Saturation: 100 %
Patient temperature: 98.9
Potassium: 3.6 mmol/L (ref 3.5–5.1)
Sodium: 154 mmol/L — ABNORMAL HIGH (ref 135–145)
TCO2: 29 mmol/L (ref 22–32)
pCO2 arterial: 44.6 mmHg (ref 32.0–48.0)
pH, Arterial: 7.404 (ref 7.350–7.450)
pO2, Arterial: 277 mmHg — ABNORMAL HIGH (ref 83.0–108.0)

## 2019-02-17 LAB — MAGNESIUM
Magnesium: 1.5 mg/dL — ABNORMAL LOW (ref 1.7–2.4)
Magnesium: 2.1 mg/dL (ref 1.7–2.4)

## 2019-02-17 LAB — COMPREHENSIVE METABOLIC PANEL
ALT: 45 U/L — ABNORMAL HIGH (ref 0–44)
AST: 71 U/L — ABNORMAL HIGH (ref 15–41)
Albumin: 1.4 g/dL — ABNORMAL LOW (ref 3.5–5.0)
Alkaline Phosphatase: 422 U/L — ABNORMAL HIGH (ref 38–126)
Anion gap: 9 (ref 5–15)
BUN: 22 mg/dL (ref 8–23)
CO2: 24 mmol/L (ref 22–32)
Calcium: 7.7 mg/dL — ABNORMAL LOW (ref 8.9–10.3)
Chloride: 118 mmol/L — ABNORMAL HIGH (ref 98–111)
Creatinine, Ser: 1.58 mg/dL — ABNORMAL HIGH (ref 0.61–1.24)
GFR calc Af Amer: 49 mL/min — ABNORMAL LOW (ref >60)
GFR calc non Af Amer: 42 mL/min — ABNORMAL LOW (ref >60)
Glucose, Bld: 290 mg/dL — ABNORMAL HIGH (ref 70–99)
Potassium: 3.4 mmol/L — ABNORMAL LOW (ref 3.5–5.1)
Sodium: 151 mmol/L — ABNORMAL HIGH (ref 135–145)
Total Bilirubin: 1.2 mg/dL (ref 0.3–1.2)
Total Protein: 5.5 g/dL — ABNORMAL LOW (ref 6.5–8.1)

## 2019-02-17 LAB — CALCIUM, IONIZED: Calcium, Ionized, Serum: 4.5 mg/dL (ref 4.5–5.6)

## 2019-02-17 LAB — PROCALCITONIN: Procalcitonin: 5.89 ng/mL

## 2019-02-17 LAB — LACTIC ACID, PLASMA: Lactic Acid, Venous: 2.2 mmol/L (ref 0.5–1.9)

## 2019-02-17 LAB — PHOSPHORUS: Phosphorus: 2.1 mg/dL — ABNORMAL LOW (ref 2.5–4.6)

## 2019-02-17 LAB — TRIGLYCERIDES: Triglycerides: 227 mg/dL — ABNORMAL HIGH (ref <150)

## 2019-02-17 MED ORDER — INSULIN DETEMIR 100 UNIT/ML ~~LOC~~ SOLN
5.0000 [IU] | Freq: Every day | SUBCUTANEOUS | Status: DC
  Administered 2019-02-17 – 2019-02-19 (×3): 5 [IU] via SUBCUTANEOUS
  Filled 2019-02-17 (×3): qty 0.05

## 2019-02-17 MED ORDER — POTASSIUM CHLORIDE 20 MEQ/15ML (10%) PO SOLN
40.0000 meq | Freq: Once | ORAL | Status: AC
  Administered 2019-02-17: 40 meq
  Filled 2019-02-17: qty 30

## 2019-02-17 MED ORDER — ATORVASTATIN CALCIUM 40 MG PO TABS: 40.0000 mg | ORAL_TABLET | Freq: Every day | ORAL | Status: DC

## 2019-02-17 MED ORDER — CLOPIDOGREL BISULFATE 75 MG PO TABS: 75.0000 mg | ORAL_TABLET | Freq: Every day | ORAL | Status: DC

## 2019-02-17 MED ORDER — CLOPIDOGREL BISULFATE 75 MG PO TABS
75.0000 mg | ORAL_TABLET | Freq: Every day | ORAL | Status: DC
  Administered 2019-02-17 – 2019-02-18 (×2): 75 mg
  Filled 2019-02-17 (×2): qty 1

## 2019-02-17 MED ORDER — ATORVASTATIN CALCIUM 40 MG PO TABS
40.0000 mg | ORAL_TABLET | Freq: Every day | ORAL | Status: DC
  Administered 2019-02-17: 40 mg
  Filled 2019-02-17: qty 1

## 2019-02-17 MED ORDER — MAGNESIUM SULFATE 2 GM/50ML IV SOLN
2.0000 g | Freq: Once | INTRAVENOUS | Status: AC
  Administered 2019-02-17: 2 g via INTRAVENOUS
  Filled 2019-02-17: qty 50

## 2019-02-17 MED ORDER — VITAL HIGH PROTEIN PO LIQD: 1000.0000 mL | ORAL | Status: DC

## 2019-02-17 MED ORDER — VITAL AF 1.2 CAL PO LIQD
1000.0000 mL | ORAL | Status: DC
  Administered 2019-02-17 – 2019-02-18 (×2): 1000 mL

## 2019-02-17 MED ORDER — IPRATROPIUM-ALBUTEROL 0.5-2.5 (3) MG/3ML IN SOLN: 3.0000 mL | RESPIRATORY_TRACT | Status: DC | PRN

## 2019-02-17 MED ORDER — PRO-STAT SUGAR FREE PO LIQD: 30.0000 mL | Freq: Two times a day (BID) | ORAL | Status: DC

## 2019-02-17 MED ORDER — PANTOPRAZOLE SODIUM 40 MG PO PACK
40.0000 mg | PACK | ORAL | Status: DC
  Administered 2019-02-17 – 2019-02-18 (×2): 40 mg
  Filled 2019-02-17 (×2): qty 20

## 2019-02-17 NOTE — Progress Notes (Signed)
Nutrition Follow-up  RD working remotely.  DOCUMENTATION CODES:   Severe malnutrition in context of chronic illness  INTERVENTION:   Tube feeding: - Start Vital AF 1.2 @ 20 ml/hr and increase rate by 10 ml q 4 hours until goal rate of 60 ml/hr (1440 ml/day) - Free water per CCM, currently 300 ml q 4 hours  Tube feeding regimen at goal and current free water provides 1728 kcal, 108 grams of protein, and 2968 ml of H2O.  Tube feeding regimen at goal and current propofol provides 1789 total kcal (97% of needs).  NUTRITION DIAGNOSIS:   Severe Malnutrition related to chronic illness (CVA, dementia) as evidenced by severe muscle depletion, severe fat depletion.  Ongoing, being addressed via TF  GOAL:   Patient will meet greater than or equal to 90% of their needs  Met via TF  MONITOR:   Diet advancement, Labs, Weight trends, TF tolerance, Skin, I & O's  REASON FOR ASSESSMENT:   Ventilator, Consult Enteral/tube feeding initiation and management  ASSESSMENT:   Maurice Stone is a 78 year old male Jehovah's Witness with a history of stroke in 2002 and 2015 with R sided deficits, CAD, COPD, dementia, HTN, T2DM and GERD who presented to the ED after being found down at his residence for an unknown amount of time. Per the ED note, he has not been seen for the past 3 days. A friend found him on the ground only wearing his boxer briefs and was unresponsive. The friend who brought him in says that he is usually fully alert and oriented at his baseline.  Pt admitted with DKA, sepsis, and AMS.  7/21 - NGT placed 7/22 - s/p BSE with recommendations to continue NPO 7/24 - Rapid Response for worsening respiratory status, transferred to ICU, intubated  RD consulted for tube feeding initiation and management. Adult ICU tube feeding protocol has been ordered by MD. Per CCM note, pt is not a candidate for extubation at this time.  CT A/P on 7/24 showing multiple small lucencies throughout  visualized spine and pelvis concerning for multiple myeloma or metastatic disease along with large lucencies in both femurs. Palliative care consult pending.  NGT in place.  Patient is currently intubated on ventilator support MV: 9.3 L/min Temp (24hrs), Avg:99.9 F (37.7 C), Min:98.4 F (36.9 C), Max:102.9 F (39.4 C) BP: 130/61 MAP: 80  Drips: Propofol: 2.3 ml/hr (provides 61 kcal daily from lipid) Fentanyl: 2.5 ml/hr LR: 75 ml/hr  Medications reviewed and include: SSI q 4 hours, Levemir 5 units daily, Protonix, KCl 40 mEq once, IV abx, IV magnesium sulfate 2 grams once  Labs reviewed: sodium 151, potassium 3.4, magnesium 1.5, elevated LFTs, triglycerides 227, hemoglobin 9.3 CBG's: 182-309 x 24 hours  UOP: 3160 ml x 24 hours NGT output: 150 ml x 24 hours I/O's: +13.9 L since admit  Diet Order:   Diet Order            Diet NPO time specified  Diet effective now              EDUCATION NEEDS:   No education needs have been identified at this time  Skin:  Skin Assessment: Skin Integrity Issues: DTI: back, coccyx Stage I: left heel Stage II: coccyx Other: MASD to scrotum  Last BM:  02/17/19 medium type 7  Height:   Ht Readings from Last 1 Encounters:  01/31/2019 _0  (1.803 m)    Weight:   Wt Readings from Last 1 Encounters:  02/17/19 78 kg  Ideal Body Weight:  78.2 kg  BMI:  Body mass index is 23.98 kg/m.  Estimated Nutritional Needs:   Kcal:  4650  Protein:  100-115 grams  Fluid:  >/= 1.8 L    Gaynell Face, MS, RD, LDN Inpatient Clinical Dietitian Pager: [phone] Weekend/After Hours: [phone]

## 2019-02-17 NOTE — Progress Notes (Signed)
NAME:  Maurice Stone, MRN:  [id number], DOB:  03/23/1941, LOS: 6 ADMISSION DATE:  02/19/2019, CONSULTATION DATE:  02/12/2019 REFERRING MD:  Dr. Daryll Drown, IMTS CHIEF COMPLAINT: altered mental status  Brief History   78 yo male found at home with altered mental status (last seen 3 days prior to admission).  Found to have fever, hypotension, hyperglycemia, and anion gap acidosis from DKA, rhabdomyolysis, AKI and sepsis with E coli bacteremia.  Past Medical History   Jehovah's Witness, COPD, CAD, Dementia, GERD, HTN, CVA, DM, OA  Significant Hospital Events   7/19 admit 7/21 transferred out of ICU 7/24 increasing O2 requirements, probable aspiration, transferred back to ICU  Consults:    Procedures:  ETT 7/24 >   Significant Diagnostic Tests:  [facility] 7/19 > No acute intracranial abnormality. Stable moderate generalized atrophy and mild chronic microvascular ischemic changes of the white matter. Remote lacunar strokes in the left basal ganglia. CT head 7/24 > negative. CT A / P 7/24 > multiple small lucencies throughout visualized spine and pelvis concerning for multiple myeloma or metastatic disease.  Large lucencies in both femurs.  Possible right pyelonephritis.  RML and RML opacities concerning for PNA.  Micro Data:  COVD 7/19 > Negative BC 7/19 > E. Coli  Urine 7/19 >>> E.coli COVID 7/24 >> Negative Sputum 7/24 >> Blood 7/24 >>   Antimicrobials:  Vancomycin 7/19 Cefepime 7/19 >> 7/21 Rocephin 7/20 >> 7/22 Flagyl 7/20 Rocephin 7/24 Vancomycin 7/24 Cefepime 7/24 >>   Interim history/subjective:  Remains on vent, sedation.  Objective   Blood pressure (!) 100/57, pulse 91, temperature 99 F (37.2 C), temperature source Oral, resp. rate 17, height '5\' 11"'  (1.803 m), weight 78 kg, SpO2 100 %.    Vent Mode: PSV;CPAP FiO2 (%):  [40 %-100 %] 40 % Set Rate:  [16 bmp] 16 bmp Vt Set:  [600 mL] 600 mL PEEP:  [5 cmH20] 5 cmH20 Pressure Support:  [5 cmH20] 5 cmH20 Plateau  Pressure:  [19 cmH20-21 cmH20] 19 cmH20   Intake/Output Summary (Last 24 hours) at 02/17/2019 0752 Last data filed at 02/17/2019 0747 Gross per 24 hour  Intake 6454.29 ml  Output 3300 ml  Net 3154.29 ml   Filed Weights   02/15/19 0500 02/16/19 0500 02/17/19 0342  Weight: 78.6 kg 75.8 kg 78 kg    Examination:  General - sedated Eyes - pupils reactive ENT - no sinus tenderness, no stridor Cardiac - regular rate/rhythm, no murmur Chest - decreased BS, scattered rhonchi, no wheeze Abdomen - soft, non tender, + bowel sounds Extremities - no cyanosis, clubbing, or edema, decreased muscle bulk Skin - no rashes Neuro - RASS -2, opens eyes with stimulation, not following commands GU - foley in place  CXR (reviewed by me) - RUL consolidation   Resolved Problems:  DKA, Metabolic Acidosis   Assessment & Plan:   Acute hypoxic respiratory failure from aspiration pneumonia. Hx of COPD. - not candidate for extubation at this time - f/u CXR - prn BDs  Sepsis. Aspiration pneumonia. E coli bacteremia with possible Rt pyelonephritis. - continue cefepime - will need at least 2 weeks of ABx for bacteremia  Acute metabolic encephalopathy. Hx of CVA, dementia. - RASS goal -1  - resume plavix - hold outpt aricept - continue zoloft  Hx of HTN, HLD. - hold outpt norvasc, cozaar, lopressor - resume lipitor  AKI with rhabdomyolysis. Hypernatremia. Hypokalemia. Hypomagnesemia. - baseline creatinine 1.26 from 01/16/19 - replace electrolytes - free water  DM with  neuropathy. - SSI - restart levemir - hold outpt neurontin, metformin  Moderate protein calorie malnutrition. - tube feeds  Bony lucencies on CT abd/pelvis. - possible metastatic disease versus multiple myeloma - might need oncology assessment depending on outcome of family discussions  Goals of care. - will need further d/w family - palliative care consulted  Anemia, thrombocytopenia of critical illness. -  f/u CBC - Jehovah's Witness >> no blood products  Best practice:  Diet: tube feeds DVT prophylaxis: protonix GI prophylaxis: SQ heparin Mobility: Bedrest. Code Status: Full Disposition: ICU  Labs:   CMP Latest Ref Rng & Units 02/17/2019 02/17/2019 02/16/2019  Glucose 70 - 99 mg/dL 290(H) - 292(H)  BUN 8 - 23 mg/dL 22 - 26(H)  Creatinine 0.61 - 1.24 mg/dL 1.58(H) - 1.68(H)  Sodium 135 - 145 mmol/L 151(H) 154(H) 154(H)  Potassium 3.5 - 5.1 mmol/L 3.4(L) 3.6 3.8  Chloride 98 - 111 mmol/L 118(H) - 122(H)  CO2 22 - 32 mmol/L 24 - 25  Calcium 8.9 - 10.3 mg/dL 7.7(L) - 8.1(L)  Total Protein 6.5 - 8.1 g/dL 5.5(L) - -  Total Bilirubin 0.3 - 1.2 mg/dL 1.2 - -  Alkaline Phos 38 - 126 U/L 422(H) - -  AST 15 - 41 U/L 71(H) - -  ALT 0 - 44 U/L 45(H) - -    CBC Latest Ref Rng & Units 02/17/2019 02/17/2019 02/16/2019  WBC 4.0 - 10.5 K/uL 14.2(H) - 12.9(H)  Hemoglobin 13.0 - 17.0 g/dL 9.3(L) 16.3 10.2(L)  Hematocrit 39.0 - 52.0 % 29.7(L) 48.0 33.3(L)  Platelets 150 - 400 K/uL 111(L) - 97(L)   ABG    Component Value Date/Time   PHART 7.404 02/17/2019 0009   PCO2ART 44.6 02/17/2019 0009   PO2ART 277.0 (H) 02/17/2019 0009   HCO3 27.8 02/17/2019 0009   TCO2 29 02/17/2019 0009   ACIDBASEDEF 1.6 02/12/2019 0921   O2SAT 100.0 02/17/2019 0009   CBG (last 3)  Recent Labs    02/17/19 0008 02/17/19 0413 02/17/19 0750  GLUCAP 233* 220* 215*    CC time 34 minutes  Chesley Mires, MD [facility] Pulmonary/Critical Care 02/17/2019, 8:17 AM

## 2019-02-17 NOTE — Consult Note (Signed)
Consultation Note Date: 02/17/2019   Patient Name: Maurice Stone  DOB: September 06, 1940  MRN: [id number]  Age / Sex: 78 y.o., male  PCP: [facility] Referring Physician: Chesley Mires, MD  Reason for Consultation: Establishing goals of care and Psychosocial/spiritual support  HPI/Patient Profile: 78 y.o. male  with past medical history of coronary artery disease, CVA 2002, 2015; diabetes type 2, GERD, hypertension, COPD, dementia, admitted on 01/24/2019 after being found down (potentially for 3 days), with fever, hypotension, tachycardia.  He was diagnosed with E. coli bacteremia as well as DKA.  He was started on antibiotics and insulin drip.  He was initially admitted to the ICU on 02/07/2019 and transferred out of ICU on 02/13/2019.  On 02/16/2019 he had increased oxygen demands, questionable aspiration, and was transferred back to ICU where he has now been intubated.  He is currently weaning. CT of the abdomen performed on 02/16/2019 revealed new onset of either multiple myeloma or metastatic bony disease from unknown primary  Consult ordered for goals of care and psychosocial support in the setting of new diagnosis of cancer with underlying poor functional status, recurrent respiratory failure.   Clinical Assessment and Goals of Care: Patient seen, chart reviewed.  Patient is unable to participate in assessment given current clinical condition, intubation.  I did speak to 1 of his 4 children, Maurice Stone regarding her father's current clinical condition.  Introduced palliative medicine services as an additional resource and source of support with focus aimed at patient's goals and quality of life.  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  We did discuss CODE STATUS.  I defined full code versus DNR and Maurice Stone reports that full code is correct.  They are aware of  new findings of cancer and are  interested in further oncology work-up and input.  Patient is legally separated so decision making power in the event that Maurice Stone is unable to speak for himself , would fall to the majority of his children.  He does have 4 children.  Their names and numbers are listed under demographics in [place].  He has not designated 1 of his children specifically as healthcare power of attorney.  Maurice Stone reports that they all are making decisions together as a family and she has been point of contact in relating information.  Maurice Stone can be reached at [phone].  She lives in [place] but is readily available  Maurice Stone describes her father as very independent.  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  She states they have tried assisted living facilities for him in the past but he eventually "checks himself out, gets his things,  then checks himself into a motel and we have to go looking for him".  Baseline, Maurice Stone was driving able to do his own shopping and was able to live independently and alone.  Maurice Stone served in the [employer] during the [place] War and received a Purple Heart.  Maurice Stone states that if her father could not be independent,  that might be a turning point in terms of goals of care and how aggressive her father might wish to be.  She believes he would go to rehab short-term but would not want to live long-term in a facility  SUMMARY OF RECOMMENDATIONS   Initial consult focused on introduction of services and establishing rapport At this point family wishes for full scope of treatment and confirms that patient is a full code They are interested in further information from oncology services before making further decisions Palliative medicine to stay involved and monitor patient clinically and continue to update family, and address goals of care Code Status/Advance Care Planning:  Full code    Symptom Management:   All symptoms managed by critical care medicine at this point   Palliative Prophylaxis:   Aspiration, Bowel Regimen, Delirium Protocol, Eye Care, Frequent Pain Assessment, Oral Care and Turn Reposition  Additional Recommendations (Limitations, Scope, Preferences):  Full Scope Treatment  Psycho-social/Spiritual:   Desire for further Chaplaincy support:no  Additional Recommendations: Referral to Community Resources   Prognosis:   Unable to determine  Discharge Planning: To Be Determined      Primary Diagnoses: Present on Admission: . DKA (diabetic ketoacidosis) ([place])   I have reviewed the medical record, interviewed the patient and family, and examined the patient. The following aspects are pertinent.  Past Medical History:  Diagnosis Date  . Arthritis    RA  . COPD (chronic obstructive pulmonary disease) ([place])   . Coronary artery disease   . Dementia ([place])    mild/notes 05/03/2017  . GERD (gastroesophageal reflux disease)   . Hypertension   . Refusal of blood transfusions as patient is Jehovah's Witness   . Stroke [facility]) 2002; 2015   Maurice Stone 05/03/2017  . Type II diabetes mellitus ([facility])    insulin dependent    Social History   Socioeconomic History  . Marital status: Married    Spouse name: Not on file  . Number of children: Not on file  . Years of education: Not on file  . Highest education level: Not on file  Occupational History  . Not on file  Social Needs  . Financial resource strain: Not on file  . Food insecurity    Worry: Not on file    Inability: Not on file  . Transportation needs    Medical: Not on file    Non-medical: Not on file  Tobacco Use  . Smoking status: Former Smoker    Years: 10.00    Types: Cigarettes    Quit date: 01/17/1973    Years since quitting: 46.1  . Smokeless tobacco: Never Used  Substance and Sexual Activity  . Alcohol use: Yes    Alcohol/week: 1.0 standard drinks    Types: 1 Cans of beer per week    Comment: Occasionally  . Drug use: No  . Sexual activity: Not on file   Lifestyle  . Physical activity    Days per week: Not on file    Minutes per session: Not on file  . Stress: Not on file  Relationships  . Social Herbalist on phone: Not on file    Gets together: Not on file    Attends religious service: Not on file    Active member of club or organization: Not on file    Attends meetings of clubs or organizations: Not on file    Relationship status: Not on file  Other Topics Concern  . Not on file  Social  History Narrative   Use to work driving a Actuary for [employer]   Retired currently   Recently married 2016   Family History  Problem Relation Age of Onset  . Hypertension Other   . Diabetes Other    Scheduled Meds: . atorvastatin  40 mg Per Tube QHS  . chlorhexidine gluconate (MEDLINE KIT)  15 mL Mouth Rinse BID  . Chlorhexidine Gluconate Cloth  6 each Topical Q0600  . clopidogrel  75 mg Per Tube Daily  . free water  300 mL Per Tube Q4H  . heparin injection (subcutaneous)  5,000 Units Subcutaneous Q8H  . insulin aspart  0-15 Units Subcutaneous Q4H  . insulin detemir  5 Units Subcutaneous Daily  . mouth rinse  15 mL Mouth Rinse 10 times per day  . pantoprazole sodium  40 mg Per Tube Q24H  . sertraline  100 mg Per Tube QHS  . sodium chloride flush  10-40 mL Intracatheter Q12H  . sodium chloride flush  3 mL Intravenous Q12H   Continuous Infusions: . ceFEPime (MAXIPIME) IV Stopped (02/17/19 1047)  . feeding supplement (VITAL AF 1.2 CAL) 1,000 mL (02/17/19 0913)  . fentaNYL infusion INTRAVENOUS 50 mcg/hr (02/17/19 1100)  . lactated ringers 75 mL/hr at 02/17/19 1100  . propofol (DIPRIVAN) infusion 10 mcg/kg/min (02/17/19 1100)   PRN Meds:.acetaminophen, fentaNYL, ipratropium-albuterol, polyethylene glycol, sodium chloride flush Medications Prior to Admission:  Prior to Admission medications   Medication Sig Start Date End Date Taking? Authorizing Provider  amLODipine (NORVASC) 2.5 MG tablet Take 2.5 mg by mouth daily.    Yes [provider]  atorvastatin (LIPITOR) 40 MG tablet Take 40 mg by mouth at bedtime.   Yes [provider]  Carboxymethylcellulose Sodium 0.25 % SOLN Place 1 drop into both eyes See admin instructions. 2-4 times daily   Yes [provider]  clopidogrel (PLAVIX) 75 MG tablet Take 1 tablet (75 mg total) by mouth daily. 02/01/14  Yes Angiulli, Lavon Paganini, PA-C  clotrimazole (LOTRIMIN) 1 % cream Apply to affected area 2 times daily 06/08/17  Yes Leaphart, Zack Seal, PA-C  donepezil (ARICEPT) 10 MG tablet Take 1 tablet (10 mg total) by mouth at bedtime. Patient taking differently: Take 10 mg by mouth daily.  02/01/14  Yes Angiulli, Lavon Paganini, PA-C  gabapentin (NEURONTIN) 300 MG capsule Take 600 mg by mouth 3 (three) times daily.    Yes [provider]  insulin aspart protamine- aspart (NOVOLOG MIX 70/30) (70-30) 100 UNIT/ML injection Inject 0.7 mLs (70 Units total) into the skin 2 (two) times daily with a meal. Patient taking differently: Inject 80 Units 2 (two) times daily with a meal into the skin.  05/08/17  Yes Alphonzo Grieve, MD  losartan (COZAAR) 50 MG tablet Take 25 mg by mouth daily.   Yes [provider]  metFORMIN (GLUCOPHAGE) 500 MG tablet Take 500 mg by mouth 2 (two) times daily with a meal.    Yes [provider]  metoprolol tartrate (LOPRESSOR) 50 MG tablet Take 50 mg by mouth 2 (two) times daily.   Yes [provider]  naproxen sodium (ALEVE) 220 MG tablet Take 220 mg by mouth as needed (pain).   Yes [provider]  Omega-3 Fatty Acids (FISH OIL) 1000 MG CAPS Take 1,000 mg by mouth daily.   Yes [provider]  polyethylene glycol (MIRALAX / GLYCOLAX) packet Take 17 g by mouth daily as needed for mild constipation.    Yes [provider]  sertraline (  ZOLOFT) 100 MG tablet Take 1 tablet (100 mg total) by mouth daily. 05/08/17  Yes Alphonzo Grieve, MD  Semaglutide 1 MG/DOSE SOPN Inject 1 mg into the  skin once a week.    [provider]   Allergies  Allergen Reactions  . Metformin And Related Other (See Comments)    unspecified  . Other Other (See Comments)    Nonsteroidal anti-inflammatory - unspecified  . Lisinopril Cough   Review of Systems  Unable to perform ROS: Intubated    Physical Exam Vitals signs and nursing note reviewed.  Constitutional:      Appearance: He is ill-appearing.     Comments: Ill-appearing older man seen in ICU; intubated  HENT:     Head: Normocephalic and atraumatic.  Cardiovascular:     Rate and Rhythm: Tachycardia present.  Pulmonary:     Comments: Intubated Skin:    General: Skin is warm and dry.  Neurological:     Comments: Unable to test  Psychiatric:     Comments: Unable to test     Vital Signs: BP 123/64   Pulse (!) 102   Temp (!) 102.3 F (39.1 C) (Axillary) Comment: nurse notified  Resp (!) 21   Ht '5\' 11"'  (1.803 m)   Wt 78 kg   SpO2 95%   BMI 23.98 kg/m  Pain Scale: CPOT POSS *See Group Information*: S-Acceptable,Sleep, easy to arouse Pain Score: Asleep   SpO2: SpO2: 95 % O2 Device:SpO2: 95 % O2 Flow Rate: .O2 Flow Rate (L/min): 15 L/min  IO: Intake/output summary:   Intake/Output Summary (Last 24 hours) at 02/17/2019 1207 Last data filed at 02/17/2019 1151 Gross per 24 hour  Intake 6266.03 ml  Output 3625 ml  Net 2641.03 ml    LBM: Last BM Date: 02/17/19 Baseline Weight: Weight: 78.8 kg Most recent weight: Weight: 78 kg     Palliative Assessment/Data:   Flowsheet Rows     Most Recent Value  Intake Tab  Referral Department  Critical care  Unit at Time of Referral  ICU  Palliative Care Primary Diagnosis  Sepsis/Infectious Disease  Date Notified  02/16/19  Palliative Care Type  New Palliative care  Reason for referral  Clarify Goals of Care, Psychosocial or Spiritual support  Date of Admission  02/07/2019  Date first seen by Palliative Care  02/17/19  # of days Palliative referral response time   1 Day(s)  # of days IP prior to Palliative referral  5  Clinical Assessment  Palliative Performance Scale Score  30%  Pain Max last 24 hours  Not able to report  Pain Min Last 24 hours  Not able to report  Dyspnea Max Last 24 Hours  Not able to report  Dyspnea Min Last 24 hours  Not able to report  Nausea Max Last 24 Hours  Not able to report  Nausea Min Last 24 Hours  Not able to report  Anxiety Max Last 24 Hours  Not able to report  Anxiety Min Last 24 Hours  Not able to report  Other Max Last 24 Hours  Not able to report  Psychosocial & Spiritual Assessment  Palliative Care Outcomes  Patient/Family meeting held?  Yes  Who was at the meeting?  Maurice Stone, Maurice Stone  Palliative Care Outcomes  Provided psychosocial or spiritual support      Time In: 0900 Time Out: 1010 Time Total: 70 min Greater than 50%  of this time was spent counseling and coordinating care related to the above  assessment and plan. Staffed briefly with Dr. Halford Chessman and beds die RN Shea Stakes  Signed by: Dory Horn, NP   Please contact Palliative Medicine Team phone at [phone] for questions and concerns.  For individual provider: See Shea Evans

## 2019-02-17 NOTE — Clinical Social Work Note (Signed)
CSW acknowledges consult, "Need POA for children. Patient is separated from wife but could not formally divorce due to [place]."  HCPOA cannot be established unless patient is fully alert and oriented. His wife may defer decision making to their children if she does not feel comfortable making decisions.  Maurice Stone, [place]

## 2019-02-18 ENCOUNTER — Inpatient Hospital Stay (HOSPITAL_COMMUNITY): Payer: Medicare Other

## 2019-02-18 DIAGNOSIS — J9601 Acute respiratory failure with hypoxia: Secondary | ICD-10-CM

## 2019-02-18 LAB — POCT I-STAT 7, (LYTES, BLD GAS, ICA,H+H)
Acid-Base Excess: 1 mmol/L (ref 0.0–2.0)
Bicarbonate: 26.5 mmol/L (ref 20.0–28.0)
Calcium, Ion: 1.2 mmol/L (ref 1.15–1.40)
HCT: 26 % — ABNORMAL LOW (ref 39.0–52.0)
Hemoglobin: 8.8 g/dL — ABNORMAL LOW (ref 13.0–17.0)
O2 Saturation: 96 %
Patient temperature: 99.4
Potassium: 3.3 mmol/L — ABNORMAL LOW (ref 3.5–5.1)
Sodium: 155 mmol/L — ABNORMAL HIGH (ref 135–145)
TCO2: 28 mmol/L (ref 22–32)
pCO2 arterial: 44.3 mmHg (ref 32.0–48.0)
pH, Arterial: 7.386 (ref 7.350–7.450)
pO2, Arterial: 87 mmHg (ref 83.0–108.0)

## 2019-02-18 LAB — GLUCOSE, CAPILLARY
Glucose-Capillary: 225 mg/dL — ABNORMAL HIGH (ref 70–99)
Glucose-Capillary: 274 mg/dL — ABNORMAL HIGH (ref 70–99)
Glucose-Capillary: 277 mg/dL — ABNORMAL HIGH (ref 70–99)
Glucose-Capillary: 295 mg/dL — ABNORMAL HIGH (ref 70–99)
Glucose-Capillary: 323 mg/dL — ABNORMAL HIGH (ref 70–99)
Glucose-Capillary: 341 mg/dL — ABNORMAL HIGH (ref 70–99)

## 2019-02-18 LAB — CBC
HCT: 27.9 % — ABNORMAL LOW (ref 39.0–52.0)
Hemoglobin: 8.7 g/dL — ABNORMAL LOW (ref 13.0–17.0)
MCH: 28.3 pg (ref 26.0–34.0)
MCHC: 31.2 g/dL (ref 30.0–36.0)
MCV: 90.9 fL (ref 80.0–100.0)
Platelets: 152 10*3/uL (ref 150–400)
RBC: 3.07 MIL/uL — ABNORMAL LOW (ref 4.22–5.81)
RDW: 16.2 % — ABNORMAL HIGH (ref 11.5–15.5)
WBC: 16.6 10*3/uL — ABNORMAL HIGH (ref 4.0–10.5)
nRBC: 0.2 % (ref 0.0–0.2)

## 2019-02-18 LAB — TRIGLYCERIDES
Triglycerides: 370 mg/dL — ABNORMAL HIGH (ref <150)
Triglycerides: 424 mg/dL — ABNORMAL HIGH (ref <150)

## 2019-02-18 LAB — RENAL FUNCTION PANEL
Albumin: 1.3 g/dL — ABNORMAL LOW (ref 3.5–5.0)
Anion gap: 10 (ref 5–15)
BUN: 25 mg/dL — ABNORMAL HIGH (ref 8–23)
CO2: 24 mmol/L (ref 22–32)
Calcium: 7.6 mg/dL — ABNORMAL LOW (ref 8.9–10.3)
Chloride: 115 mmol/L — ABNORMAL HIGH (ref 98–111)
Creatinine, Ser: 1.41 mg/dL — ABNORMAL HIGH (ref 0.61–1.24)
GFR calc Af Amer: 56 mL/min — ABNORMAL LOW (ref >60)
GFR calc non Af Amer: 48 mL/min — ABNORMAL LOW (ref >60)
Glucose, Bld: 297 mg/dL — ABNORMAL HIGH (ref 70–99)
Phosphorus: 1.9 mg/dL — ABNORMAL LOW (ref 2.5–4.6)
Potassium: 3.6 mmol/L (ref 3.5–5.1)
Sodium: 149 mmol/L — ABNORMAL HIGH (ref 135–145)

## 2019-02-18 LAB — PHOSPHORUS: Phosphorus: 1.9 mg/dL — ABNORMAL LOW (ref 2.5–4.6)

## 2019-02-18 LAB — MAGNESIUM: Magnesium: 2.1 mg/dL (ref 1.7–2.4)

## 2019-02-18 LAB — PROCALCITONIN: Procalcitonin: 4.2 ng/mL

## 2019-02-18 MED ORDER — CLOPIDOGREL BISULFATE 75 MG PO TABS: 75.0000 mg | ORAL_TABLET | Freq: Every day | ORAL | Status: DC

## 2019-02-18 MED ORDER — SERTRALINE HCL 100 MG PO TABS: 100.0000 mg | ORAL_TABLET | Freq: Every day | ORAL | Status: DC

## 2019-02-18 MED ORDER — SERTRALINE HCL 100 MG PO TABS
100.0000 mg | ORAL_TABLET | Freq: Every day | ORAL | Status: DC
  Administered 2019-02-18 – 2019-03-01 (×12): 100 mg
  Filled 2019-02-18 (×12): qty 1

## 2019-02-18 MED ORDER — ATORVASTATIN CALCIUM 40 MG PO TABS
40.0000 mg | ORAL_TABLET | Freq: Every day | ORAL | Status: DC
  Administered 2019-02-18 – 2019-02-25 (×8): 40 mg
  Filled 2019-02-18 (×9): qty 1

## 2019-02-18 MED ORDER — POLYETHYLENE GLYCOL 3350 17 G PO PACK: 17.0000 g | PACK | Freq: Every day | ORAL | Status: DC | PRN

## 2019-02-18 MED ORDER — FENTANYL CITRATE (PF) 100 MCG/2ML IJ SOLN
INTRAMUSCULAR | Status: AC
  Administered 2019-02-18: 50 ug
  Filled 2019-02-18: qty 2

## 2019-02-18 MED ORDER — POTASSIUM PHOSPHATES 15 MMOLE/5ML IV SOLN
20.0000 mmol | Freq: Once | INTRAVENOUS | Status: AC
  Administered 2019-02-18: 20 mmol via INTRAVENOUS
  Filled 2019-02-18: qty 6.67

## 2019-02-18 MED ORDER — JEVITY 1.2 CAL PO LIQD
1000.0000 mL | ORAL | Status: DC
  Administered 2019-02-18: 50 mL/h
  Filled 2019-02-18 (×2): qty 1000

## 2019-02-18 MED ORDER — CLOPIDOGREL BISULFATE 75 MG PO TABS
75.0000 mg | ORAL_TABLET | Freq: Every day | ORAL | Status: DC
  Administered 2019-02-19 – 2019-03-02 (×12): 75 mg
  Filled 2019-02-18 (×12): qty 1

## 2019-02-18 MED ORDER — PANTOPRAZOLE SODIUM 40 MG PO PACK
40.0000 mg | PACK | ORAL | Status: DC
  Administered 2019-02-19 – 2019-03-01 (×11): 40 mg
  Filled 2019-02-18 (×15): qty 20

## 2019-02-18 MED ORDER — ACETAMINOPHEN 160 MG/5ML PO SOLN: 650.0000 mg | ORAL | Status: DC | PRN

## 2019-02-18 MED ORDER — PANTOPRAZOLE SODIUM 40 MG IV SOLR: 40.0000 mg | INTRAVENOUS | Status: DC

## 2019-02-18 MED ORDER — MIDAZOLAM HCL 2 MG/2ML IJ SOLN
INTRAMUSCULAR | Status: AC
  Administered 2019-02-18: 1 mg
  Filled 2019-02-18: qty 2

## 2019-02-18 MED ORDER — PROPOFOL 1000 MG/100ML IV EMUL
5.0000 ug/kg/min | INTRAVENOUS | Status: DC
  Administered 2019-02-18: 10 ug/kg/min via INTRAVENOUS
  Administered 2019-02-18 – 2019-02-19 (×2): 45 ug/kg/min via INTRAVENOUS
  Filled 2019-02-18 (×3): qty 100

## 2019-02-18 MED ORDER — ACETAMINOPHEN 160 MG/5ML PO SOLN
650.0000 mg | ORAL | Status: DC | PRN
  Administered 2019-02-18 – 2019-02-28 (×8): 650 mg
  Filled 2019-02-18 (×8): qty 20.3

## 2019-02-18 MED ORDER — ATORVASTATIN CALCIUM 40 MG PO TABS: 40.0000 mg | ORAL_TABLET | Freq: Every day | ORAL | Status: DC

## 2019-02-18 NOTE — Procedures (Signed)
Extubation Procedure Note  Patient Details:   Name: Maurice Stone DOB: [birth year] MRN: [id number]   Airway Documentation:    Vent end date: 02/18/19 Vent end time: 1035   Evaluation  O2 sats: stable throughout Complications: No apparent complications Patient did tolerate procedure well. Bilateral Breath Sounds: Clear, Diminished   Yes  Patient had positive cuff leak prior to extubation. Extubated to 5 LPM nasal cannula. SPO2 94%. Breathing through mouth more. No stridor noted. Patient has good productive cough. Vitals stable.   [name] 02/18/2019, 10:41 AM

## 2019-02-18 NOTE — Progress Notes (Signed)
NAME:  Maurice Stone, MRN:  [id number], DOB:  1940/12/31, LOS: 7 ADMISSION DATE:  02/06/2019, CONSULTATION DATE:  02/12/2019 REFERRING MD:  Dr. Daryll Drown, IMTS CHIEF COMPLAINT: altered mental status  Brief History   78 yo male found at home with altered mental status (last seen 3 days prior to admission).  Found to have fever, hypotension, hyperglycemia, and anion gap acidosis from DKA, rhabdomyolysis, AKI and sepsis with E coli bacteremia.  Past Medical History   Jehovah's Witness, COPD, CAD, Dementia, GERD, HTN, CVA, DM, OA  Significant Hospital Events   7/19 admit 7/21 transferred out of ICU 7/24 increasing O2 requirements, probable aspiration, transferred back to ICU 7/26 Extubate  Consults:    Procedures:  ETT 7/24 > 7/26  Significant Diagnostic Tests:  CT head 7/19 > No acute intracranial abnormality. Stable moderate generalized atrophy and mild chronic microvascular ischemic changes of the white matter. Remote lacunar strokes in the left basal ganglia. CT head 7/24 > negative. CT abd/pelvis 7/24 > multiple small lucencies throughout visualized spine and pelvis concerning for multiple myeloma or metastatic disease.  Large lucencies in both femurs.  Possible right pyelonephritis.  RML and RML opacities concerning for PNA. UPEP 7/25 >>   Micro Data:  COVD 7/19 > Negative BC 7/19 > E. Coli  Urine 7/19 >>> E.coli COVID 7/24 >> Negative Sputum 7/24 >> Blood 7/24 >>   Antimicrobials:  Vancomycin 7/19 Cefepime 7/19 >> 7/21 Rocephin 7/20 >> 7/22 Flagyl 7/20 Rocephin 7/24 Vancomycin 7/24 Cefepime 7/24 >>   Interim history/subjective:  Remains on vent, sedation.  Objective   Blood pressure 137/63, pulse 100, temperature 99.4 F (37.4 C), temperature source Oral, resp. rate 20, height _0  (1.803 m), weight 80.6 kg, SpO2 96 %.    Vent Mode: PSV;CPAP FiO2 (%):  [40 %] 40 % Set Rate:  [16 bmp] 16 bmp Vt Set:  [600 mL] 600 mL PEEP:  [5 cmH20] 5 cmH20 Pressure  Support:  [5 cmH20-10 cmH20] 5 cmH20 Plateau Pressure:  [18 cmH20] 18 cmH20   Intake/Output Summary (Last 24 hours) at 02/18/2019 1028 Last data filed at 02/18/2019 4680 Gross per 24 hour  Intake 4956.4 ml  Output 1900 ml  Net 3056.4 ml   Filed Weights   02/16/19 0500 02/17/19 0342 02/18/19 0336  Weight: 75.8 kg 78 kg 80.6 kg    Examination:  General - sedated Eyes - pupils reactive ENT - no sinus tenderness, no stridor Cardiac - regular rate/rhythm, no murmur Chest - equal breath sounds b/l, no wheezing or rales Abdomen - soft, non tender, + bowel sounds Extremities - no cyanosis, clubbing, or edema Skin - no rashes Neuro - RASS 0, opens eyes with stimulation  CXR (reviewed by me) - decreased ASD RUL    Resolved Problems:  DKA, Metabolic Acidosis, AKI, Rhabdomyolysis  Assessment & Plan:   Acute hypoxic respiratory failure from aspiration pneumonia. Hx of COPD. - extubation trial 7/26 - f/u CXR - prn BDs  Sepsis. Aspiration pneumonia. E coli bacteremia with possible Rt pyelonephritis. - continue cefepime >> will need at least 2 weeks of ABx in setting of bacteremia - f/u cultures from 3/21  Acute metabolic encephalopathy. Hx of CVA, dementia. - monitor mental status after extubation; not sure what baseline function was - continue plavix, zoloft - hold outpt aricept for now  Hx of HTN, HLD. - continue lipitor - hold outpt norvasc, cozaar, lopressor  Hypernatremia. Hypokalemia. Hypophosphatemia - continue IV fluids - replace electrolytes  DM with neuropathy. -  SSI with levemir - hold outpt neurontin, metformin  Moderate protein calorie malnutrition. - swallow assessment after extubation before resuming diet  Bony lucencies on CT abd/pelvis. - possible metastatic disease versus multiple myeloma - f/u UPEP 7/25 - might need oncology assessment depending on outcome of family discussions  Goals of care. - palliative care consulted 7/25  Anemia,  thrombocytopenia of critical illness. - f/u CBC intermittently - check iron levels - Jehovah's Witness >> no blood products  Best practice:  Diet: NPO DVT prophylaxis: protonix GI prophylaxis: SQ heparin Mobility: Bedrest. Code Status: Full Disposition: ICU  Labs:   CMP Latest Ref Rng & Units 02/18/2019 02/17/2019 02/17/2019  Glucose 70 - 99 mg/dL 297(H) 290(H) -  BUN 8 - 23 mg/dL 25(H) 22 -  Creatinine 0.61 - 1.24 mg/dL 1.41(H) 1.58(H) -  Sodium 135 - 145 mmol/L 149(H) 151(H) 154(H)  Potassium 3.5 - 5.1 mmol/L 3.6 3.4(L) 3.6  Chloride 98 - 111 mmol/L 115(H) 118(H) -  CO2 22 - 32 mmol/L 24 24 -  Calcium 8.9 - 10.3 mg/dL 7.6(L) 7.7(L) -  Total Protein 6.5 - 8.1 g/dL - 5.5(L) -  Total Bilirubin 0.3 - 1.2 mg/dL - 1.2 -  Alkaline Phos 38 - 126 U/L - 422(H) -  AST 15 - 41 U/L - 71(H) -  ALT 0 - 44 U/L - 45(H) -    CBC Latest Ref Rng & Units 02/18/2019 02/17/2019 02/17/2019  WBC 4.0 - 10.5 K/uL 16.6(H) 14.2(H) -  Hemoglobin 13.0 - 17.0 g/dL 8.7(L) 9.3(L) 16.3  Hematocrit 39.0 - 52.0 % 27.9(L) 29.7(L) 48.0  Platelets 150 - 400 K/uL 152 111(L) -   ABG    Component Value Date/Time   PHART 7.404 02/17/2019 0009   PCO2ART 44.6 02/17/2019 0009   PO2ART 277.0 (H) 02/17/2019 0009   HCO3 27.8 02/17/2019 0009   TCO2 29 02/17/2019 0009   ACIDBASEDEF 1.6 02/12/2019 0921   O2SAT 100.0 02/17/2019 0009   CBG (last 3)  Recent Labs    02/18/19 0005 02/18/19 0332 02/18/19 0820  GLUCAP 225* 274* 323*    CC time 33 minutes  Chesley Mires, MD [facility] Pulmonary/Critical Care 02/18/2019, 10:28 AM

## 2019-02-18 NOTE — Procedures (Signed)
Intubation Procedure Note Maurice Stone [id number] [birth year]  Procedure: Intubation Indications: Respiratory insufficiency  Procedure Details Consent: Risks of procedure as well as the alternatives and risks of each were explained to the (patient/caregiver).  Consent for procedure obtained. Time Out: Verified patient identification, verified procedure, site/side was marked, verified correct patient position, special equipment/implants available, medications/allergies/relevent history reviewed, required imaging and test results available.  Performed  Maximum sterile technique was used including cap, gloves, gown, hand hygiene and mask.   Grade I view with MAC 3 via Glidescope. ETT visualized passing between vocal cords. Thick secretions present in airway.  Evaluation Hemodynamic Status: BP stable throughout; O2 sats: stable throughout Patient's Current Condition: stable Complications: No apparent complications Patient did tolerate procedure well. Chest X-ray ordered to verify placement.  CXR: pending.   Maurice Stone 02/18/2019

## 2019-02-18 NOTE — Progress Notes (Signed)
Patient was not keeping nasal cannula in nose and SPO2 would drop to low 80's. Patient was then placed on a venturi mask to help stay on patients face. Initially set at 45%, patient came up to high 80's. Then increased to 55% VM and patient is now at 94% SPO2. RT will continue to monitor and adjust O2 as needed.

## 2019-02-18 NOTE — Progress Notes (Signed)
Called and spoke to [facility] with concerns over elevated blood sugars of high 200's to mid 300's during the last 12 hours despite pt currently on being sliding scale coverage. Current blood sugar of 295. Pt intubated with continuous tube feedings going. Awaiting orders.

## 2019-02-18 NOTE — Progress Notes (Signed)
Spoke with pt's daughter, Santiago Glad.  Updated about current status and treatment plan.  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  She reports Maurice Stone was fairly independent prior to admission, but she didn't notice a recent change in his behaviors.  He was also not consistent with his eating or taking his medications.  Explained he might need reintubation if he gets worse.  She would like to continue aggressive measures for now, and will update the rest of her siblings.  Chesley Mires, MD [facility] Pulmonary/Critical Care 02/18/2019, 1:52 PM

## 2019-02-18 NOTE — Progress Notes (Signed)
Assisted tele visit to patient with son.  Amore Grater M, RN  

## 2019-02-18 NOTE — Progress Notes (Signed)
  Speech Language Pathology  Patient Details Name: [name] MRN: [id number] DOB: 08/22/1940 Today's Date: 02/18/2019 Time:  -    ST familiar with pt from initial eval 7/21 and at that time was lethargic- no swallow reponse observed with water. Seen 7/24 in which he was alert however held water with labial leakage, delayed swallow and s/s aspiration. Pt then intubated 7/24-7/26 (at 10:40). Received new order for swallow. Given deconditioning and brief perioed of intubation, pt would likely benefit from holding assessment until 7/27.                    GO                Houston Siren 02/18/2019, 11:48 AM   Orbie Pyo Colvin Caroli.Ed Risk analyst [phone]; weekend [phone] Office [phone]

## 2019-02-19 LAB — GLUCOSE, CAPILLARY
Glucose-Capillary: 292 mg/dL — ABNORMAL HIGH (ref 70–99)
Glucose-Capillary: 299 mg/dL — ABNORMAL HIGH (ref 70–99)
Glucose-Capillary: 312 mg/dL — ABNORMAL HIGH (ref 70–99)
Glucose-Capillary: 314 mg/dL — ABNORMAL HIGH (ref 70–99)
Glucose-Capillary: 326 mg/dL — ABNORMAL HIGH (ref 70–99)
Glucose-Capillary: 334 mg/dL — ABNORMAL HIGH (ref 70–99)

## 2019-02-19 LAB — BASIC METABOLIC PANEL
Anion gap: 10 (ref 5–15)
BUN: 25 mg/dL — ABNORMAL HIGH (ref 8–23)
CO2: 24 mmol/L (ref 22–32)
Calcium: 7.7 mg/dL — ABNORMAL LOW (ref 8.9–10.3)
Chloride: 117 mmol/L — ABNORMAL HIGH (ref 98–111)
Creatinine, Ser: 1.53 mg/dL — ABNORMAL HIGH (ref 0.61–1.24)
GFR calc Af Amer: 50 mL/min — ABNORMAL LOW (ref >60)
GFR calc non Af Amer: 44 mL/min — ABNORMAL LOW (ref >60)
Glucose, Bld: 351 mg/dL — ABNORMAL HIGH (ref 70–99)
Potassium: 3.3 mmol/L — ABNORMAL LOW (ref 3.5–5.1)
Sodium: 151 mmol/L — ABNORMAL HIGH (ref 135–145)

## 2019-02-19 LAB — CBC
HCT: 25.1 % — ABNORMAL LOW (ref 39.0–52.0)
Hemoglobin: 7.9 g/dL — ABNORMAL LOW (ref 13.0–17.0)
MCH: 28.6 pg (ref 26.0–34.0)
MCHC: 31.5 g/dL (ref 30.0–36.0)
MCV: 90.9 fL (ref 80.0–100.0)
Platelets: 185 10*3/uL (ref 150–400)
RBC: 2.76 MIL/uL — ABNORMAL LOW (ref 4.22–5.81)
RDW: 16.5 % — ABNORMAL HIGH (ref 11.5–15.5)
WBC: 16.2 10*3/uL — ABNORMAL HIGH (ref 4.0–10.5)
nRBC: 0.5 % — ABNORMAL HIGH (ref 0.0–0.2)

## 2019-02-19 LAB — CULTURE, RESPIRATORY W GRAM STAIN

## 2019-02-19 LAB — MAGNESIUM: Magnesium: 2.1 mg/dL (ref 1.7–2.4)

## 2019-02-19 LAB — IRON AND TIBC
Iron: 22 ug/dL — ABNORMAL LOW (ref 45–182)
Saturation Ratios: 22 % (ref 17.9–39.5)
TIBC: 99 ug/dL — ABNORMAL LOW (ref 250–450)
UIBC: 77 ug/dL

## 2019-02-19 LAB — PROTEIN ELECTROPHORESIS, SERUM
A/G Ratio: 0.5 — ABNORMAL LOW (ref 0.7–1.7)
Albumin ELP: 2 g/dL — ABNORMAL LOW (ref 2.9–4.4)
Alpha-1-Globulin: 0.4 g/dL (ref 0.0–0.4)
Alpha-2-Globulin: 1.4 g/dL — ABNORMAL HIGH (ref 0.4–1.0)
Beta Globulin: 0.9 g/dL (ref 0.7–1.3)
Gamma Globulin: 1.6 g/dL (ref 0.4–1.8)
Globulin, Total: 4.3 g/dL — ABNORMAL HIGH (ref 2.2–3.9)
M-Spike, %: 0.7 g/dL — ABNORMAL HIGH
Total Protein ELP: 6.3 g/dL (ref 6.0–8.5)

## 2019-02-19 LAB — FERRITIN: Ferritin: 320 ng/mL (ref 24–336)

## 2019-02-19 LAB — PHOSPHORUS: Phosphorus: 1.8 mg/dL — ABNORMAL LOW (ref 2.5–4.6)

## 2019-02-19 MED ORDER — VITAMIN C 500 MG/5ML PO SYRP
250.0000 mg | ORAL_SOLUTION | Freq: Two times a day (BID) | ORAL | Status: DC
  Administered 2019-02-19 – 2019-03-02 (×23): 250 mg
  Filled 2019-02-19 (×23): qty 2.5

## 2019-02-19 MED ORDER — TAB-A-VITE/IRON PO TABS
1.0000 | ORAL_TABLET | Freq: Every day | ORAL | Status: DC
  Administered 2019-02-19: 13:00:00 1 via ORAL
  Filled 2019-02-19: qty 1

## 2019-02-19 MED ORDER — INSULIN DETEMIR 100 UNIT/ML ~~LOC~~ SOLN
20.0000 [IU] | Freq: Every day | SUBCUTANEOUS | Status: DC
  Administered 2019-02-20 – 2019-02-21 (×2): 20 [IU] via SUBCUTANEOUS
  Filled 2019-02-19 (×3): qty 0.2

## 2019-02-19 MED ORDER — POLYETHYLENE GLYCOL 3350 17 G PO PACK
17.0000 g | PACK | Freq: Every day | ORAL | Status: DC
  Administered 2019-02-19 – 2019-02-21 (×2): 17 g
  Filled 2019-02-19 (×3): qty 1

## 2019-02-19 MED ORDER — FERROUS SULFATE 325 (65 FE) MG PO TABS: 325.0000 mg | ORAL_TABLET | Freq: Every day | ORAL | Status: DC

## 2019-02-19 MED ORDER — FERROUS SULFATE 220 (44 FE) MG/5ML PO ELIX
220.0000 mg | ORAL_SOLUTION | Freq: Two times a day (BID) | ORAL | Status: DC
  Administered 2019-02-19 – 2019-02-23 (×9): 220 mg
  Filled 2019-02-19 (×10): qty 5

## 2019-02-19 MED ORDER — VITAL AF 1.2 CAL PO LIQD
1000.0000 mL | ORAL | Status: DC
  Administered 2019-02-19 – 2019-03-01 (×11): 1000 mL
  Filled 2019-02-19 (×3): qty 1000

## 2019-02-19 MED ORDER — INSULIN DETEMIR 100 UNIT/ML ~~LOC~~ SOLN
15.0000 [IU] | SUBCUTANEOUS | Status: AC
  Administered 2019-02-19: 15 [IU] via SUBCUTANEOUS
  Filled 2019-02-19: qty 0.15

## 2019-02-19 MED ORDER — DEXMEDETOMIDINE HCL IN NACL 400 MCG/100ML IV SOLN
0.4000 ug/kg/h | INTRAVENOUS | Status: DC
  Administered 2019-02-13: 19:00:00 0.8 ug/kg/h via INTRAVENOUS
  Administered 2019-02-19: 0.4 ug/kg/h via INTRAVENOUS
  Administered 2019-02-19: 1.2 ug/kg/h via INTRAVENOUS
  Administered 2019-02-20: 0.6 ug/kg/h via INTRAVENOUS
  Administered 2019-02-20 – 2019-02-21 (×3): 1.2 ug/kg/h via INTRAVENOUS
  Administered 2019-02-21: 0.2 ug/kg/h via INTRAVENOUS
  Administered 2019-02-21: 0.7 ug/kg/h via INTRAVENOUS
  Administered 2019-02-21: 1.2 ug/kg/h via INTRAVENOUS
  Administered 2019-02-22: 0.9 ug/kg/h via INTRAVENOUS
  Administered 2019-02-22: 1 ug/kg/h via INTRAVENOUS
  Administered 2019-02-22: 1.2 ug/kg/h via INTRAVENOUS
  Administered 2019-02-22: 0.8 ug/kg/h via INTRAVENOUS
  Administered 2019-02-23 (×3): 1.2 ug/kg/h via INTRAVENOUS
  Administered 2019-02-23: 0.6 ug/kg/h via INTRAVENOUS
  Administered 2019-02-24 (×2): 1.2 ug/kg/h via INTRAVENOUS
  Filled 2019-02-19 (×2): qty 100
  Filled 2019-02-19: qty 200
  Filled 2019-02-19 (×3): qty 100
  Filled 2019-02-19: qty 200
  Filled 2019-02-19 (×4): qty 100
  Filled 2019-02-19: qty 200
  Filled 2019-02-19 (×2): qty 100
  Filled 2019-02-19 (×2): qty 200

## 2019-02-19 MED ORDER — POTASSIUM PHOSPHATES 15 MMOLE/5ML IV SOLN
40.0000 meq | Freq: Once | INTRAVENOUS | Status: AC
  Administered 2019-02-19: 40 meq via INTRAVENOUS
  Filled 2019-02-19: qty 9.09

## 2019-02-19 NOTE — Progress Notes (Signed)
Inpatient Diabetes Program Recommendations  AACE/ADA: New Consensus Statement on Inpatient Glycemic Control (2015)  Target Ranges:  Prepandial:   less than 140 mg/dL      Peak postprandial:   less than 180 mg/dL (1-2 hours)      Critically ill patients:  140 - 180 mg/dL   Results for Maurice Stone, Maurice Stone (MRN [id number]) as of 02/19/2019 10:08  Ref. Range 02/18/2019 00:05 02/18/2019 03:32 02/18/2019 08:20 02/18/2019 12:11 02/18/2019 16:10 02/18/2019 19:16  Glucose-Capillary Latest Ref Range: 70 - 99 mg/dL 225 (H)  5 units NOVOLOG  274 (H)  8 units NOVOLOG  323 (H)  11 units NOVOLOG +  5 units LEVEMIR  277 (H)  8 units NOVOLOG  341 (H)  11 units NOVOLOG  295 (H)  8 units NOVOLOG    Results for Maurice Stone, Maurice Stone (MRN [id number]) as of 02/19/2019 10:08  Ref. Range 02/19/2019 00:01 02/19/2019 04:02 02/19/2019 07:43  Glucose-Capillary Latest Ref Range: 70 - 99 mg/dL 299 (H)  8 units NOVOLOG  314 (H)  11 units NOVOLOG  326 (H)  11 units NOVOLOG      Home DM Meds: 70/30 Insulin 80 units BID       Metformin 500 mg BID       Ozempic 1 mg Qweek   Current Orders: Levemir 5 units daily      Novolog Moderate Correction Scale/ SSI (0-15 units) TID Q4 hours      Getting Tube Feeds 50 cc/hr.  Re-Intubated last PM.  Takes large doses of 70/30 Insulin BID at home prior to hospitalization.     MD- Please consider the following in-hospital insulin adjustments:   1. Increase Levemir to 10 units BID (0.25 units/kg dosing)  2. Start Novolog Tube Feed Coverage: Novolog 3 units Q4 hours  HOLD if Tube Feeds HELD for any reason      --Will follow patient during hospitalization--  Wyn Quaker RN, MSN, CDE Diabetes Coordinator Inpatient Glycemic Control Team Team Pager: [phone] (8a-5p)

## 2019-02-19 NOTE — Progress Notes (Signed)
Pharmacy Antibiotic Note  Maurice Stone is a 78 y.o. male admitted on 02/03/2019 with e.coli bacteremia. Patient with persistent fevers, and possible lung changes on xray. Patient with e.coli in blood cultures from 7/19, awaiting sensitivities from [facility].   Plan: Continue cefepime 2g q 12 hrs F/u cultures, renal function, LOT   Height: 5\' 11"  (180.3 cm) Weight: 177 lb 11.1 oz (80.6 kg) IBW/kg (Calculated) : 75.3  Temp (24hrs), Avg:99.8 F (37.7 C), Min:98.8 F (37.1 C), Max:101.3 F (38.5 C)  Recent Labs  Lab 02/12/19 0814  02/12/19 1058 02/12/19 1347  02/15/19 0214 02/16/19 0342 02/16/19 1550 02/16/19 1907 02/16/19 2249 02/17/19 0115 02/18/19 0350 02/19/19 0220  WBC  --    < >  --   --    < > 6.3  --  12.9*  --   --  14.2* 16.6* 16.2*  CREATININE  --    < >  --  2.52*   < > 1.69* 1.68*  --   --   --  1.58* 1.41* 1.53*  LATICACIDVEN 3.7*  --  3.8* 2.6*  --   --   --   --  2.2* 2.2*  --   --   --    < > = values in this interval not displayed.     Antimicrobials this admission: Cefepime 7/19 >>7/20, 7/24 >  Vanc 7/19 >> 7/19, 7/24>>7/24 CTX 7/20 >>7/23. 7/24>>7/24 Cefazolin 7/23>7/24   Microbiology results: 7/19 BCx: E.coli 7/19 UCx: 3K e. Osa [name] 7/19 COVID PCR: neg    Maurice Stone, Maurice Stone 02/19/2019 8:06 AM

## 2019-02-19 NOTE — Progress Notes (Signed)
Pt condom catheter noted to have fallen off.  Attempted to replace but d/t swelling and anatomy unable to replace.  Called elink to advise.  Will continue to monitor.

## 2019-02-19 NOTE — Progress Notes (Addendum)
Nutrition Follow-up  DOCUMENTATION CODES:   Severe malnutrition in context of chronic illness  INTERVENTION:    Replace low potassium and phosphorus today, receiving potassium phosphate IV.   Continue to monitor magnesium, potassium, and phosphorus levels; MD to replete as needed, as pt is at risk for refeeding syndrome given severe malnutrition with current low potassium and phosphorus.   Change TF to better meet nutrition needs: Vital AF 1.2 at 40 ml/h, increase by 10 ml every 8 hours to goal rate of 70 ml/h (1680 ml per day)   Provides 2016 kcal (2143 kcal total w/ calories from propofol), 126 gm protein, 1362 ml free water daily  NUTRITION DIAGNOSIS:   Severe Malnutrition related to chronic illness(CVA, dementia) as evidenced by severe muscle depletion, severe fat depletion.  Ongoing  GOAL:   Patient will meet greater than or equal to 90% of their needs  Progressing with TF  MONITOR:   Vent status, TF tolerance, Labs, Skin  REASON FOR ASSESSMENT:   Ventilator, Consult Enteral/tube feeding initiation and management  ASSESSMENT:   Maurice Stone is a 78 year old male Jehovah's Witness with a history of stroke in 2002 and 2015 with R sided deficits, CAD, COPD, dementia, HTN, T2DM and GERD who presented to the ED after being found down at his residence for an unknown amount of time. Per the ED note, he has not been seen for the past 3 days. A friend found him on the ground only wearing his boxer briefs and was unresponsive. The friend who brought him in says that he is usually fully alert and oriented at his baseline.  Patient was extubated on 7/26, but required re-intubation later in the day. Received MD Consult for TF initiation and management. NGT in place. Currently receiving Jevity 1.2 at 50 ml/h providing 1440 kcal, 67 gm protein, 972 ml free water daily.  Patient is currently intubated on ventilator support MV: 14 L/min Temp (24hrs), Avg:99.6 F (37.6 C), Min:98.8  F (37.1 C), Max:101.3 F (38.5 C)  Propofol: 4.8 ml/hr providing 127 kcal from lipid  Labs reviewed. Sodium 151 (H), potassium 3.3 (L), phosphorus 1.8 (L), magnesium 2.1 (WNL) CBG's: [phone]  Low potassium & phosphorus are likely related to refeeding syndrome.   Medications reviewed and include novolog, levemir, propofol, potassium phosphate.  I/O +18.1 L since admission Weight up by 1.8 kg  Diet Order:   Diet Order            Diet NPO time specified  Diet effective now              EDUCATION NEEDS:   No education needs have been identified at this time  Skin:  Skin Assessment: Skin Integrity Issues: Skin Integrity Issues:: DTI, Stage I, Other (Comment), Stage II DTI: back, coccyx Stage I: left heel Stage II: coccyx Other: MASD to scrotum  Last BM:  7/26 (type 6)  Height:   Ht Readings from Last 1 Encounters:  02/20/2019 5\' 11"  (1.803 m)    Weight:   Wt Readings from Last 1 Encounters:  02/19/19 80.6 kg    Ideal Body Weight:  78.2 kg  BMI:  Body mass index is 24.78 kg/m.  Estimated Nutritional Needs:   Kcal:  2000  Protein:  115-130 gm  Fluid:  >/= 2 L    Molli Barrows, RD, LDN, [place] Pager [phone] After Hours Pager [phone]

## 2019-02-19 NOTE — Progress Notes (Signed)
Assisted tele visit to patient with family member.  Nabil Bubolz R, RN  

## 2019-02-19 NOTE — Progress Notes (Signed)
Triglycerides trending up.  Will switch to precedex for sedation.   Wilber Oliphant, M.D.  PGY-2  Family Medicine  [phone] 02/19/2019 2:02 PM

## 2019-02-19 NOTE — Progress Notes (Signed)
[place] Progress Note Patient Name: Maurice Stone DOB: 04/18/1941 MRN: [id number]   Date of Service  02/19/2019  HPI/Events of Note  Pt needs left wrist restraint order for safety  eICU Interventions  Order entered        Frederik Pear 02/19/2019, 8:39 PM

## 2019-02-19 NOTE — Progress Notes (Addendum)
NAME:  Maurice Stone, MRN:  [id number], DOB:  05-Jan-1941, LOS: 7 ADMISSION DATE:  02/10/2019, CONSULTATION DATE:  02/12/2019 REFERRING MD:  Dr. Daryll Drown, IMTS CHIEF COMPLAINT: altered mental status  Brief History   78 yo male found at home with altered mental status (last seen 3 days prior to admission).  Found to have fever, hypotension, hyperglycemia, and anion gap acidosis from DKA, rhabdomyolysis, AKI and sepsis with E coli bacteremia.  Past Medical History   Jehovah's Witness, COPD, CAD, Dementia, GERD, HTN, CVA, DM, OA  Significant Hospital Events   7/19 admit 7/21 transferred out of ICU 7/24 increasing O2 requirements, probable aspiration, transferred back to ICU 7/26 Extubate  Consults:  Pharmacy, nutrition, SLP, palliative   Procedures:  ETT 7/24 > 7/26 ETT 7/26 >   Significant Diagnostic Tests:  CT head 7/19 > No acute intracranial abnormality. Stable moderate generalized atrophy and mild chronic microvascular ischemic changes of the white matter. Remote lacunar strokes in the left basal ganglia. CT head 7/24 > negative. CT abd/pelvis 7/24 > multiple small lucencies throughout visualized spine and pelvis concerning for multiple myeloma or metastatic disease.  Large lucencies in both femurs.  Possible right pyelonephritis.  RML and RML opacities concerning for PNA. UPEP 7/25 >>   Micro Data:  COVD 7/19 > Negative BC 7/19 > E. Coli sensitivities pending >>  Urine 7/19 >>> E.coli COVID 7/24 >> Negative Sputum 7/24 >> rare yeast Blood 7/24 >> NGx 3 days >  Antimicrobials:  Vancomycin 7/19 & 7/24 Cefepime 7/19 >> 7/21 & 7/24 >> Rocephin 7/20 >> 7/22 & 7/24 Flagyl 7/20  Interim history/subjective:  Trialed off of vent yesterday failed. Reintubated around 1800. One time fever at 1300. No fevers since. One temp at 100.3 overnight.   Objective   Temp:  [98.8 F (37.1 C)-101.3 F (38.5 C)] 98.8 F (37.1 C) (07/27 0746) Pulse Rate:  [83-124] 102 (07/27  0900) Resp:  [16-29] 25 (07/27 0900) BP: (95-148)/(52-82) 148/67 (07/27 0900) SpO2:  [82 %-100 %] 97 % (07/27 0900) FiO2 (%):  [40 %-60 %] 40 % (07/27 0804) Weight:  [80.6 kg] 80.6 kg (07/27 0436)    Intake/Output      07/26 0701 - 07/27 0700 07/27 0701 - 07/28 0700   I.V. (mL/kg) 2061.8 (25.6) 163.6 (2)   NG/GT 3340    IV Piggyback 690    Total Intake(mL/kg) 6091.8 (75.6) 163.6 (2)   Urine (mL/kg/hr) 2400 (1.2) 300 (1.7)   Stool 0    Total Output 2400 300   Net +3691.8 -136.4        Stool Occurrence 1 x      Vent Mode: CPAP;PSV FiO2 (%):  [40 %-60 %] 40 % Set Rate:  [16 bmp] 16 bmp Vt Set:  [600 mL] 600 mL PEEP:  [5 cmH20] 5 cmH20 Pressure Support:  [12 cmH20] 12 cmH20 Plateau Pressure:  [20 cmH20-22 cmH20] 22 cmH20        Filed Weights   02/16/19 0500 02/17/19 0342 02/18/19 0336  Weight: 75.8 kg 78 kg 80.6 kg   Examination: General: lying in bed. Head rests on left side and patient will turn head towards the right every few seconds HENT: PERRLA Lungs: CTAB, intubated  Cardiovascular: RRR.  Abdomen: soft NT,ND Extremities: warm and well perfused.  Neuro: Not responding to name or stimuli.  GU: Foley in place    Resolved Problems:  DKA, Metabolic Acidosis, AKI, Rhabdomyolysis  Assessment & Plan:  Acute hypoxic respiratory failure from aspiration  pneumonia. Hx of COPD. Extubation trial on 7/26 1035 and had to be re-intubated on 7/26 1800. CXR yesterday with RUL pna and LLL, improved from 7/24 -- prn BDs -- SLP seen on 7/26, will continue to fu   Sepsis. Aspiration pneumonia. E coli bacteremia with possible Rt pyelonephritis. CXR as above. White count continue to increase at 14.2 today. Bandemia noted on 7/24 CBC. Possible right pyelo seen on CT A/P. 7/19 culture with E Coli, no sensitivities reported  -- continue cefepime >> will need at least 2 weeks of ABx in setting of bacteremia -- f/u cultures from 7/24 - NGTD  -- f/u micro lab for sensitivities   -- LR 33IR/JJ  Acute metabolic encephalopathy. Hx of CVA, dementia. Baseline function per daughter is independent.  - continue plavix, zoloft - hold outpt aricept for now  Hx of HTN, HLD. Normotensive  - continue lipitor - hold outpt norvasc, cozaar, lopressor  Hypernatremia. Hypokalemia. Hypophosphatemia - continue IV fluids - replace electrolytes  DM with neuropathy. Resolved DKA. CBGs in 300s. Required 30 u novolog yesterday. Increase levemir to 20 units now that TF have started.  - SSI with levemir 20 units daily  - q4 hour CBGs - hold outpt neurontin, metformin  Moderate protein calorie malnutrition. -- will need repeat swallow study with SLP  -- Continue tube feeds  Bony lucencies on CT abd/pelvis. Protein albumin gap is 4.2.  - possible metastatic disease versus multiple myeloma - f/u UPEP 7/25 - might need oncology assessment depending on outcome of family discussions  Goals of care. - palliative care consulted 7/25 -- family would like to continue to pursue aggressive measures at this time  Anemia, thrombocytopenia of critical illness. Hemoglobin 7.9 with admission Hgb 12.2. Iron levels: Fe 22, TIBC 99, ferritin 320. Platelet 185  -- f/u CBC every other day  -- Jehovah's Witness >> no blood products -- add on MVI and iron tablet once daily  -- PEG daily   Best practice:  Diet: NPO DVT prophylaxis: protonix GI prophylaxis: SQ heparin Mobility: Bedrest. Code Status: Full Disposition: ICU  Labs:   CBC: Recent Labs  Lab 02/15/19 0214 02/16/19 1550 02/17/19 0009 02/17/19 0115 02/18/19 0350 02/18/19 1825 02/19/19 0220  WBC 6.3 12.9*  --  14.2* 16.6*  --  16.2*  HGB 11.5* 10.2* 16.3 9.3* 8.7* 8.8* 7.9*  HCT 35.6* 33.3* 48.0 29.7* 27.9* 26.0* 25.1*  MCV 87.7 92.0  --  90.8 90.9  --  90.9  PLT 83* 97*  --  111* 152  --  185   CMP: Recent Labs  Lab 02/15/19 0214 02/16/19 0342 02/17/19 0009 02/17/19 0115 02/17/19 1649 02/18/19  0350 02/18/19 1825 02/19/19 0220  NA 158* 154* 154* 151*  --  149* 155* 151*  K 3.7 3.8 3.6 3.4*  --  3.6 3.3* 3.3*  CL 127* 122*  --  118*  --  115*  --  117*  CO2 22 25  --  24  --  24  --  24  GLUCOSE 323* 292*  --  290*  --  297*  --  351*  BUN 38* 26*  --  22  --  25*  --  25*  CREATININE 1.69* 1.68*  --  1.58*  --  1.41*  --  1.53*  CALCIUM 8.3* 8.1*  --  7.7*  --  7.6*  --  7.7*  MG 2.1 1.9  --  1.5* 2.1 2.1  --  2.1  PHOS 1.9* 2.6  --   --  2.1* 1.9*  1.9*  --  1.8*  ALBUMIN  --   --   --  1.4*  --  1.3*  --   --    GFR: Estimated Creatinine Clearance: 43.7 mL/min (A) (by C-G formula based on SCr of 1.53 mg/dL (H)).  Liver Function Tests: Recent Labs  Lab 02/17/19 0115 02/18/19 0350  AST 71*  --   ALT 45*  --   ALKPHOS 422*  --   BILITOT 1.2  --   PROT 5.5*  --   ALBUMIN 1.4* 1.3*   Cardiac Enzymes: Recent Labs  Lab 02/12/19 1044 02/12/19 1347 02/13/19 0320 02/14/19 0337  CKTOTAL 3,272* 2,931* 2,011* 893*    CBG: Recent Labs  Lab 02/18/19 1610 02/18/19 1916 02/19/19 0001 02/19/19 0402 02/19/19 0743  GLUCAP 341* 295* 299* 314* 326*   Anemia Panel: Recent Labs    02/19/19 0220  FERRITIN 320  TIBC 99*  IRON 22*    Sepsis Labs: Recent Labs    02/16/19 1907 02/17/19 0115 02/18/19 0350  PROCALCITON 6.46 5.89 4.20   Recent Labs    02/16/19 1703 02/17/19 0009 02/18/19 1825  HCO3 26.6 27.8 26.5   UA: glucose 150, hgb moderate, protein 100, nitrite negative, large leukocytes, >50 WBC  UPEP pending    Wilber Oliphant, M.D.  PGY-2  Family Medicine  [phone] 02/19/2019 9:10 AM

## 2019-02-19 NOTE — Progress Notes (Signed)
SLP Cancellation Note  Patient Details Name: Maurice Stone MRN: [id number] DOB: 1941/02/26   Cancelled treatment:       Reason Eval/Treat Not Completed: Medical issues which prohibited therapy - pt reintubated per chart review.    [name] 02/19/2019, 9:21 AM  [name], M.A. [place] Acute Environmental education officer [phone] Office [phone]

## 2019-02-20 ENCOUNTER — Inpatient Hospital Stay (HOSPITAL_COMMUNITY): Payer: Medicare Other

## 2019-02-20 DIAGNOSIS — Z978 Presence of other specified devices: Secondary | ICD-10-CM

## 2019-02-20 LAB — UPEP/UIFE/LIGHT CHAINS/TP, 24-HR UR
% BETA, Urine: 55.6 %
ALPHA 1 URINE: 2.7 %
Albumin, U: 7.3 %
Alpha 2, Urine: 17.1 %
Free Kappa Lt Chains,Ur: 3962.64 mg/L — ABNORMAL HIGH (ref 0.63–113.79)
Free Kappa/Lambda Ratio: 23.78 (ref 1.03–31.76)
Free Lambda Lt Chains,Ur: 166.62 mg/L — ABNORMAL HIGH (ref 0.47–11.77)
GAMMA GLOBULIN URINE: 17.3 %
M-SPIKE %, Urine: 37.5 % — ABNORMAL HIGH
M-Spike, Mg/24 Hr: 1110 mg/24 hr — ABNORMAL HIGH
Total Protein, Urine-Ur/day: 2959 mg/24 hr — ABNORMAL HIGH (ref 30–150)
Total Protein, Urine: 113.8 mg/dL
Total Volume: 2600

## 2019-02-20 LAB — BASIC METABOLIC PANEL
Anion gap: 10 (ref 5–15)
BUN: 24 mg/dL — ABNORMAL HIGH (ref 8–23)
CO2: 24 mmol/L (ref 22–32)
Calcium: 8 mg/dL — ABNORMAL LOW (ref 8.9–10.3)
Chloride: 120 mmol/L — ABNORMAL HIGH (ref 98–111)
Creatinine, Ser: 1.44 mg/dL — ABNORMAL HIGH (ref 0.61–1.24)
GFR calc Af Amer: 54 mL/min — ABNORMAL LOW (ref >60)
GFR calc non Af Amer: 47 mL/min — ABNORMAL LOW (ref >60)
Glucose, Bld: 273 mg/dL — ABNORMAL HIGH (ref 70–99)
Potassium: 3.7 mmol/L (ref 3.5–5.1)
Sodium: 154 mmol/L — ABNORMAL HIGH (ref 135–145)

## 2019-02-20 LAB — CBC
HCT: 26.6 % — ABNORMAL LOW (ref 39.0–52.0)
Hemoglobin: 8.2 g/dL — ABNORMAL LOW (ref 13.0–17.0)
MCH: 28.1 pg (ref 26.0–34.0)
MCHC: 30.8 g/dL (ref 30.0–36.0)
MCV: 91.1 fL (ref 80.0–100.0)
Platelets: 229 10*3/uL (ref 150–400)
RBC: 2.92 MIL/uL — ABNORMAL LOW (ref 4.22–5.81)
RDW: 16.5 % — ABNORMAL HIGH (ref 11.5–15.5)
WBC: 14.8 10*3/uL — ABNORMAL HIGH (ref 4.0–10.5)
nRBC: 0.5 % — ABNORMAL HIGH (ref 0.0–0.2)

## 2019-02-20 LAB — GLUCOSE, CAPILLARY
Glucose-Capillary: 238 mg/dL — ABNORMAL HIGH (ref 70–99)
Glucose-Capillary: 238 mg/dL — ABNORMAL HIGH (ref 70–99)
Glucose-Capillary: 271 mg/dL — ABNORMAL HIGH (ref 70–99)
Glucose-Capillary: 278 mg/dL — ABNORMAL HIGH (ref 70–99)
Glucose-Capillary: 315 mg/dL — ABNORMAL HIGH (ref 70–99)
Glucose-Capillary: 321 mg/dL — ABNORMAL HIGH (ref 70–99)

## 2019-02-20 MED ORDER — FREE WATER
300.0000 mL | Freq: Four times a day (QID) | Status: DC
  Administered 2019-02-20 – 2019-02-22 (×8): 300 mL

## 2019-02-20 MED ORDER — INSULIN ASPART 100 UNIT/ML ~~LOC~~ SOLN
5.0000 [IU] | SUBCUTANEOUS | Status: DC
  Administered 2019-02-20 – 2019-02-21 (×5): 5 [IU] via SUBCUTANEOUS

## 2019-02-20 MED ORDER — INSULIN ASPART 100 UNIT/ML ~~LOC~~ SOLN
3.0000 [IU] | SUBCUTANEOUS | Status: DC
  Administered 2019-02-20 (×2): 3 [IU] via SUBCUTANEOUS

## 2019-02-20 MED ORDER — INSULIN ASPART 100 UNIT/ML ~~LOC~~ SOLN: 3.0000 [IU] | SUBCUTANEOUS | Status: DC

## 2019-02-20 NOTE — Progress Notes (Signed)
Inpatient Diabetes Program Recommendations  AACE/ADA: New Consensus Statement on Inpatient Glycemic Control (2015)  Target Ranges:  Prepandial:   less than 140 mg/dL      Peak postprandial:   less than 180 mg/dL (1-2 hours)      Critically ill patients:  140 - 180 mg/dL   Results for ARIS, EVEN (MRN [id number]) as of 02/20/2019 13:44  Ref. Range 02/20/2019 00:24 02/20/2019 04:56 02/20/2019 07:42 02/20/2019 11:27  Glucose-Capillary Latest Ref Range: 70 - 99 mg/dL 321 (H)  11 units NOVOLOG  278 (H)  8 units NOVOLOG  315 (H)  14 units NOVOLOG +  20 units LANTUS  271 (H)  11 units NOVOLOG     Home DM Meds: 70/30 Insulin 80 units BID                             Metformin 500 mg BID                             Ozempic 1 mg Qweek   Current Orders: Levemir 20 units daily                            Novolog Moderate Correction Scale/ SSI (0-15 units) TID Q4 hours      Novolog 3 units Q4 hours      Increased Tube Feeds 70 cc/hr.  Takes large doses of 70/30 Insulin BID at home prior to hospitalization.  Note Levemir increased to 20 units daily yesterday and Novolog 3 units Q4H (TF coverage) started today at 8am.     MD- Please consider the following in-hospital insulin adjustments if patient continues with elevated CBGs despite insulin adjustments made:   1. Increase Levemir to 15 units BID (0.35 units/kg dosing)  2. Increase Novolog Tube Feed Coverage: Novolog 5 units Q4 hours  HOLD if Tube Feeds HELD for any reason     --Will follow patient during hospitalization--  Wyn Quaker RN, MSN, CDE Diabetes Coordinator Inpatient Glycemic Control Team Team Pager: [phone] (8a-5p)

## 2019-02-20 NOTE — Procedures (Signed)
Cortrak  Person Inserting Tube:  Esaw Dace, RD Tube Type:  Cortrak - 43 inches Tube Location:  Right nare Initial Placement:  Stomach Secured by: Bridle Technique Used to Measure Tube Placement:  Documented cm marking at nare/ corner of mouth Cortrak Secured At:  69 cm Procedure Comments:  Cortrak Tube Team Note:  Consult received to place a Cortrak feeding tube.   No x-ray is required. RN may begin using tube.   If the tube becomes dislodged please keep the tube and contact the Cortrak team at [email or web address] (password [email or web address]) for replacement.  If after hours and replacement cannot be delayed, place a NG tube and confirm placement with an abdominal x-ray.    [employer] MS, RDN, LDN, CNSC [phone] Pager  [phone] Weekend/On-Call Pager

## 2019-02-20 NOTE — Progress Notes (Signed)
Daily Progress Note   Patient Name: Maurice Stone       Date: 02/20/2019 DOB: 10-Dec-1940  Age: 78 y.o. MRN#: [id number] Attending Physician: Chesley Mires, MD Primary Care Physician: [facility] Admit Date: 01/27/2019  Reason for Consultation/Follow-up: Establishing goals of care  Subjective: Spoke with RN at bedside - she explained his recurrent fever and the fact that he is weaning but is not improving overall.    Returned call to daughter Dominica Severin in [place].  She asked for an update.  We discussed his on-going decreased responsiveness, recurrent fever and overall no significant improvement.  The family would like to meet together with Palliative to discuss options.  Four of them are coming from different towns and work - so we discussed having a meeting Friday or Saturday.  Santiago Glad expressed "Dad is trying hard to fight his way thru this".   She explained that he is a true War Hero from the [place] War.   Then she emphasized (without my asking) - "we want him resuscitated.  He's a full code".  My colleague's note indicated that Mr. Ernster was very independent.  I expressed my concern that even if he makes it though this illness he will be facing a terrible situation with what looks like cancer in his spine, pelvis and legs.  Santiago Glad acknowledged this as well.  Assessment: Patient without overall improvement.  Still minimally responsive with on-going fever.   Patient Profile/HPI:  78 y.o. male  with past medical history of coronary artery disease, CVA 2002, 2015; diabetes type 2, GERD, hypertension, COPD, dementia, admitted on 02/23/2019 after being found down (potentially for 3 days), with fever, hypotension, tachycardia.  He was diagnosed with E. coli bacteremia as well as  DKA.  He was started on antibiotics and insulin drip.  He was initially admitted to the ICU on 02/17/2019 and transferred out of ICU on 02/13/2019.  On 02/16/2019 he had increased oxygen demands, questionable aspiration, and was transferred back to ICU where he has now been intubated.  He is currently weaning. CT of the abdomen performed on 02/16/2019 revealed new onset of either multiple myeloma or metastatic bony disease from unknown primary  Consult ordered for goals of care and psychosocial support in the setting of new diagnosis of cancer with underlying poor functional status, recurrent  respiratory failure.   Length of Stay: 9  Current Medications: Scheduled Meds:   ascorbic acid  250 mg Per Tube BID   atorvastatin  40 mg Per Tube QHS   chlorhexidine gluconate (MEDLINE KIT)  15 mL Mouth Rinse BID   Chlorhexidine Gluconate Cloth  6 each Topical Q0600   clopidogrel  75 mg Per Tube Daily   ferrous sulfate  220 mg Per Tube BID WC   heparin injection (subcutaneous)  5,000 Units Subcutaneous Q8H   insulin aspart  0-15 Units Subcutaneous Q4H   insulin aspart  3 Units Subcutaneous Q4H   insulin detemir  20 Units Subcutaneous Daily   mouth rinse  15 mL Mouth Rinse 10 times per day   pantoprazole sodium  40 mg Per Tube Q24H   polyethylene glycol  17 g Per Tube Daily   sertraline  100 mg Per Tube QHS   sodium chloride flush  10-40 mL Intracatheter Q12H   sodium chloride flush  3 mL Intravenous Q12H    Continuous Infusions:  ceFEPime (MAXIPIME) IV Stopped (02/20/19 1022)   dexmedetomidine (PRECEDEX) IV infusion 0.6 mcg/kg/hr (02/20/19 1140)   feeding supplement (VITAL AF 1.2 CAL) 1,000 mL (02/20/19 0618)   lactated ringers 50 mL/hr at 02/20/19 1100    PRN Meds: acetaminophen, ipratropium-albuterol, sodium chloride flush  Physical Exam       Well developed chronically ill appearing male.  Intubated and not responding to my voice or touch CV rrr Resp no  distress Abdomen soft, nt, nd  Vital Signs: BP (!) 131/56    Pulse 63    Temp (!) 100.5 F (38.1 C) (Axillary)    Resp 20    Ht _0  (1.803 m)    Wt 81 kg    SpO2 98%    BMI 24.91 kg/m  SpO2: SpO2: 98 % O2 Device: O2 Device: Ventilator O2 Flow Rate: O2 Flow Rate (L/min): 15 L/min  Intake/output summary:   Intake/Output Summary (Last 24 hours) at 02/20/2019 1218 Last data filed at 02/20/2019 1100 Gross per 24 hour  Intake 3787.01 ml  Output 1525 ml  Net 2262.01 ml   LBM: Last BM Date: 02/19/19 Baseline Weight: Weight: 78.8 kg Most recent weight: Weight: 81 kg       Palliative Assessment/Data: 10%      Palliative Care Plan    Recommendations/Plan:  Family to come to bedside for a PMT meeting on Friday or Saturday.  Family previously requested information from Oncology.  PMT to discuss with PCCM.  Goals of Care and Additional Recommendations:  Limitations on Scope of Treatment: Full Scope Treatment  Code Status:  Full code  Prognosis:  Prognosis is poor given multiple infections which do not appear to be clearing, on-going poor mental status and inability to protect his airway  Discharge Planning:  Anticipated Hospital Death  Care plan was discussed with daughter.  Thank you for allowing the Palliative Medicine Team to assist in the care of this patient.  Total time spent:  35 min.     Greater than 50%  of this time was spent counseling and coordinating care related to the above assessment and plan.  Florentina Jenny, PA-C Palliative Medicine  Please contact Palliative MedicineTeam phone at [phone] for questions and concerns between 7 am - 7 pm.   Please see AMION for individual provider pager numbers.

## 2019-02-20 NOTE — Progress Notes (Addendum)
CSW received a call from [facility] that [facility] saying [facility] "[facility] interested" in screening the pt.  Per Olga Coaster, Advanced Care Supervisor, [facility], "wants [facility] patient", but family and provider(s) may be unaware.  2nd shift ED CSW will leave handoff for 1st shift ED CSW.  CSW will continue to follow for D/C needs.  Alphonse Guild. Mikaili Flippin, LCSW, LCAS, CSI Transitions of Care Clinical Social Worker Care Coordination Department Ph: [phone]

## 2019-02-20 NOTE — Progress Notes (Signed)
No new orders from [facility].  Was able to order Large condom cath from [employer].  Places large condom cath and it appears to be working for now.  Will continue to monitor.

## 2019-02-20 NOTE — Progress Notes (Signed)
NAME:  KIERNAN Stone, MRN:  [id number], DOB:  [birth year], LOS: 7 ADMISSION DATE:  02/15/2019, CONSULTATION DATE:  02/12/2019 REFERRING MD:  Dr. Daryll Drown, IMTS CHIEF COMPLAINT: altered mental status  Brief History   78 yo male found at home with altered mental status (last seen 3 days prior to admission).  Found to have fever, hypotension, hyperglycemia, and anion gap acidosis from DKA, rhabdomyolysis, AKI and sepsis with E coli bacteremia.  Past Medical History   Jehovah's Witness, COPD, CAD, Dementia, GERD, HTN, CVA, DM, OA  Significant Hospital Events   7/19 admit 7/21 transferred out of ICU 7/24 increasing O2 requirements, probable aspiration, transferred back to ICU 7/26 Extubate  Consults:  Pharmacy, nutrition, SLP, palliative   Procedures:  ETT 7/24 > 7/26 ETT 7/26 >   Significant Diagnostic Tests:  CT head 7/19 > No acute intracranial abnormality. Stable moderate generalized atrophy and mild chronic microvascular ischemic changes of the white matter. Remote lacunar strokes in the left basal ganglia. CT head 7/24 > negative. CT abd/pelvis 7/24 > multiple small lucencies throughout visualized spine and pelvis concerning for multiple myeloma or metastatic disease.  Large lucencies in both femurs.  Possible right pyelonephritis.  RML and RML opacities concerning for PNA. UPEP 7/25 >>   Micro Data:  COVD 7/19 > Negative BC 7/19 > E. Coli sensitivities pending >>  Urine 7/19 >>> E.coli COVID 7/24 >> Negative Sputum 7/24 >> rare yeast Blood 7/24 >> NGx 3 days >  Antimicrobials:  Vancomycin 7/19 & 7/24 Cefepime 7/19 >> 7/21 & 7/24 >> Rocephin 7/20 >> 7/22 & 7/24 Flagyl 7/20  Interim history/subjective:  Condom catheter issues overnight, resolved.   Objective   Temp:  [97.9 F (36.6 C)-103.1 F (39.5 C)] 101 F (38.3 C) (07/28 0743) Pulse Rate:  [58-113] 66 (07/28 0800) Resp:  [18-30] 21 (07/28 0800) BP: (106-154)/(51-68) 124/53 (07/28 0800) SpO2:  [97  %-100 %] 99 % (07/28 0810) FiO2 (%):  [40 %] 40 % (07/28 0810) Weight:  [81 kg] 81 kg (07/28 0430)    Intake/Output      07/27 0701 - 07/28 0700 07/28 0701 - 07/29 0700   I.V. (mL/kg) 1684.2 (20.8) 65.5 (0.8)   NG/GT 1260 70   IV Piggyback 764.4    Total Intake(mL/kg) 3708.5 (45.8) 135.5 (1.7)   Urine (mL/kg/hr) 1550 (0.8)    Stool 0    Total Output 1550    Net +2158.5 +135.5        Urine Occurrence 3 x    Stool Occurrence 2 x      Vent Mode: PRVC FiO2 (%):  [40 %] 40 % Set Rate:  [16 bmp] 16 bmp Vt Set:  [600 mL] 600 mL PEEP:  [5 cmH20] 5 cmH20 Pressure Support:  [12 cmH20] 12 cmH20 Plateau Pressure:  [18 cmH20-21 cmH20] 18 cmH20        Filed Weights   02/16/19 0500 02/17/19 0342 02/18/19 0336  Weight: 75.8 kg 78 kg 80.6 kg   Examination: General: lying in bed. Head rests on left side and patient will turn head towards the right every few seconds HENT: PERRLA Lungs: CTAB, intubated  Cardiovascular: RRR.  Abdomen: soft NT,ND Extremities: warm and well perfused.  Neuro: Not responding to name or stimuli.  GU: Foley in place    Resolved Problems:  DKA, Metabolic Acidosis, AKI, Rhabdomyolysis  Assessment & Plan:  Acute hypoxic respiratory failure from aspiration pneumonia. Hx of COPD. Failed extubation trial on 7/26 1035 and had to  be re-intubated on 7/26 1800. CXR yesterday with RUL pna and LLL, improved from 7/24.  -- continue vent -- wean as tolerated -- prn BDs -- SLP consulted  Sepsis. Aspiration pneumonia. E coli bacteremia with possible Rt pyelonephritis. CXR as above. White count continue to increase at 14.2 today. Bandemia noted on 7/24 CBC. Possible right pyelo seen on CT A/P. 7/19 culture with E Coli, no sensitivities reported. Trach aspirate with normal respiratoy flora. On 7/19, UA obtained and cam back w/ 3000 colonies ecoli resistant to ampicillin, augmentin. Repeat blood cultures on 7/24 negative.  -- continue cefepime >> will need at least  2 weeks of ABx in setting of bacteremia -- f/u cultures from 7/24 - NGTD  -- f/u micro lab for sensitivities  -- LR 86PY/PP  Acute metabolic encephalopathy. Hx of CVA, dementia. Baseline function per daughter is independent.  - continue plavix, zoloft - hold outpt aricept for now  Hx of HTN, HLD. Normotensive  - continue lipitor - hold outpt norvasc, cozaar, lopressor  Hypernatremia. Hypokalemia. Hypophosphatemia 1.3 L free water deficit.  - continue IV fluids - replace electrolytes - 400 cc free water q 6 hours per tube  DM with neuropathy. Resolved DKA. CBGs in 300s. Increased to 20 levemir. Levemir 20 units daily + q4 hour novolog 3 units.  -- titrate insulin as needed  -- q4 hour CBGs -- hold outpt neurontin, metformin  Moderate protein calorie malnutrition. -- will need repeat swallow study with SLP  -- Continue tube feeds  Bony lucencies on CT abd/pelvis. Protein albumin gap is 4.2.  - possible metastatic disease versus multiple myeloma - f/u UPEP 7/25 - Consult oncology assessment depending on outcome of family discussions  Goals of care. See palliative care note from today.  - palliative care consulted 7/25, appreciate recommendations.  -- family would like to continue to pursue aggressive measures at this time  Anemia, thrombocytopenia of critical illness. Hemoglobin 7.9 with admission Hgb 12.2. Iron levels: Fe 22, TIBC 99, ferritin 320. Platelet 185  -- f/u CBC every other day  -- Jehovah's Witness >> no blood products -- add on MVI and iron tablet once daily  -- PEG daily   Best practice:  Diet: NPO DVT prophylaxis: protonix GI prophylaxis: SQ heparin Mobility: Bedrest. Code Status: Full Disposition: ICU  Labs:   CBC: Recent Labs  Lab 02/16/19 1550  02/17/19 0115 02/18/19 0350 02/18/19 1825 02/19/19 0220 02/20/19 0234  WBC 12.9*  --  14.2* 16.6*  --  16.2* 14.8*  HGB 10.2*   < > 9.3* 8.7* 8.8* 7.9* 8.2*  HCT 33.3*   < >  29.7* 27.9* 26.0* 25.1* 26.6*  MCV 92.0  --  90.8 90.9  --  90.9 91.1  PLT 97*  --  111* 152  --  185 229   < > = values in this interval not displayed.   CMP: Recent Labs  Lab 02/15/19 0214 02/16/19 0342  02/17/19 0115 02/17/19 1649 02/18/19 0350 02/18/19 1825 02/19/19 0220 02/20/19 0234  NA 158* 154*   < > 151*  --  149* 155* 151* 154*  K 3.7 3.8   < > 3.4*  --  3.6 3.3* 3.3* 3.7  CL 127* 122*  --  118*  --  115*  --  117* 120*  CO2 22 25  --  24  --  24  --  24 24  GLUCOSE 323* 292*  --  290*  --  297*  --  351* 273*  BUN 38* 26*  --  22  --  25*  --  25* 24*  CREATININE 1.69* 1.68*  --  1.58*  --  1.41*  --  1.53* 1.44*  CALCIUM 8.3* 8.1*  --  7.7*  --  7.6*  --  7.7* 8.0*  MG 2.1 1.9  --  1.5* 2.1 2.1  --  2.1  --   PHOS 1.9* 2.6  --   --  2.1* 1.9*  1.9*  --  1.8*  --   ALBUMIN  --   --   --  1.4*  --  1.3*  --   --   --    < > = values in this interval not displayed.   GFR: Estimated Creatinine Clearance: 46.5 mL/min (A) (by C-G formula based on SCr of 1.44 mg/dL (H)).  Liver Function Tests: Recent Labs  Lab 02/17/19 0115 02/18/19 0350  AST 71*  --   ALT 45*  --   ALKPHOS 422*  --   BILITOT 1.2  --   PROT 5.5*  --   ALBUMIN 1.4* 1.3*   Cardiac Enzymes: Recent Labs  Lab 02/14/19 0337  CKTOTAL 893*    CBG: Recent Labs  Lab 02/19/19 1542 02/19/19 1930 02/20/19 0024 02/20/19 0456 02/20/19 0742  GLUCAP 334* 292* 321* 278* 315*   Anemia Panel: Recent Labs    02/19/19 0220  FERRITIN 320  TIBC 99*  IRON 22*    Sepsis Labs: Recent Labs    02/18/19 0350  PROCALCITON 4.20   Recent Labs    02/18/19 1825  HCO3 26.5   UA: glucose 150, hgb moderate, protein 100, nitrite negative, large leukocytes, >50 WBC  UPEP pending    Wilber Oliphant, M.D.  PGY-2  Family Medicine  [phone] 02/20/2019 8:56 AM

## 2019-02-21 ENCOUNTER — Inpatient Hospital Stay (HOSPITAL_COMMUNITY): Payer: Medicare Other

## 2019-02-21 ENCOUNTER — Encounter (HOSPITAL_COMMUNITY): Payer: Self-pay | Admitting: Oncology

## 2019-02-21 DIAGNOSIS — Z9911 Dependence on respirator [ventilator] status: Secondary | ICD-10-CM

## 2019-02-21 DIAGNOSIS — R6520 Severe sepsis without septic shock: Secondary | ICD-10-CM

## 2019-02-21 DIAGNOSIS — J9690 Respiratory failure, unspecified, unspecified whether with hypoxia or hypercapnia: Secondary | ICD-10-CM

## 2019-02-21 DIAGNOSIS — R4182 Altered mental status, unspecified: Secondary | ICD-10-CM

## 2019-02-21 DIAGNOSIS — G9341 Metabolic encephalopathy: Secondary | ICD-10-CM

## 2019-02-21 DIAGNOSIS — C9000 Multiple myeloma not having achieved remission: Secondary | ICD-10-CM

## 2019-02-21 DIAGNOSIS — A4151 Sepsis due to Escherichia coli [E. coli]: Principal | ICD-10-CM

## 2019-02-21 LAB — CBC WITH DIFFERENTIAL/PLATELET
Abs Immature Granulocytes: 0.21 10*3/uL — ABNORMAL HIGH (ref 0.00–0.07)
Basophils Absolute: 0 10*3/uL (ref 0.0–0.1)
Basophils Relative: 0 %
Eosinophils Absolute: 0.1 10*3/uL (ref 0.0–0.5)
Eosinophils Relative: 1 %
HCT: 25.9 % — ABNORMAL LOW (ref 39.0–52.0)
Hemoglobin: 8 g/dL — ABNORMAL LOW (ref 13.0–17.0)
Immature Granulocytes: 1 %
Lymphocytes Relative: 12 %
Lymphs Abs: 1.8 10*3/uL (ref 0.7–4.0)
MCH: 28.6 pg (ref 26.0–34.0)
MCHC: 30.9 g/dL (ref 30.0–36.0)
MCV: 92.5 fL (ref 80.0–100.0)
Monocytes Absolute: 0.5 10*3/uL (ref 0.1–1.0)
Monocytes Relative: 3 %
Neutro Abs: 12.9 10*3/uL — ABNORMAL HIGH (ref 1.7–7.7)
Neutrophils Relative %: 83 %
Platelets: 244 10*3/uL (ref 150–400)
RBC: 2.8 MIL/uL — ABNORMAL LOW (ref 4.22–5.81)
RDW: 16.5 % — ABNORMAL HIGH (ref 11.5–15.5)
WBC: 15.6 10*3/uL — ABNORMAL HIGH (ref 4.0–10.5)
nRBC: 0.4 % — ABNORMAL HIGH (ref 0.0–0.2)

## 2019-02-21 LAB — GLUCOSE, CAPILLARY
Glucose-Capillary: 136 mg/dL — ABNORMAL HIGH (ref 70–99)
Glucose-Capillary: 156 mg/dL — ABNORMAL HIGH (ref 70–99)
Glucose-Capillary: 172 mg/dL — ABNORMAL HIGH (ref 70–99)
Glucose-Capillary: 179 mg/dL — ABNORMAL HIGH (ref 70–99)
Glucose-Capillary: 189 mg/dL — ABNORMAL HIGH (ref 70–99)
Glucose-Capillary: 253 mg/dL — ABNORMAL HIGH (ref 70–99)

## 2019-02-21 LAB — BASIC METABOLIC PANEL
Anion gap: 7 (ref 5–15)
BUN: 29 mg/dL — ABNORMAL HIGH (ref 8–23)
CO2: 24 mmol/L (ref 22–32)
Calcium: 7.9 mg/dL — ABNORMAL LOW (ref 8.9–10.3)
Chloride: 120 mmol/L — ABNORMAL HIGH (ref 98–111)
Creatinine, Ser: 1.5 mg/dL — ABNORMAL HIGH (ref 0.61–1.24)
GFR calc Af Amer: 52 mL/min — ABNORMAL LOW (ref >60)
GFR calc non Af Amer: 45 mL/min — ABNORMAL LOW (ref >60)
Glucose, Bld: 228 mg/dL — ABNORMAL HIGH (ref 70–99)
Potassium: 3.5 mmol/L (ref 3.5–5.1)
Sodium: 151 mmol/L — ABNORMAL HIGH (ref 135–145)

## 2019-02-21 LAB — PSA: Prostatic Specific Antigen: 0.96 ng/mL (ref 0.00–4.00)

## 2019-02-21 LAB — CULTURE, BLOOD (ROUTINE X 2)
Culture: NO GROWTH
Culture: NO GROWTH
Special Requests: ADEQUATE
Special Requests: ADEQUATE

## 2019-02-21 MED ORDER — SODIUM CHLORIDE 0.9 % IV SOLN
INTRAVENOUS | Status: DC | PRN
  Administered 2019-02-21 – 2019-02-24 (×3): 250 mL via INTRAVENOUS
  Administered 2019-02-25: 20:00:00 via INTRAVENOUS

## 2019-02-21 MED ORDER — POTASSIUM CHLORIDE 20 MEQ/15ML (10%) PO SOLN
40.0000 meq | Freq: Once | ORAL | Status: AC
  Administered 2019-02-21: 40 meq
  Filled 2019-02-21: qty 30

## 2019-02-21 MED ORDER — INSULIN ASPART 100 UNIT/ML ~~LOC~~ SOLN
8.0000 [IU] | SUBCUTANEOUS | Status: DC
  Administered 2019-02-21 – 2019-02-22 (×6): 8 [IU] via SUBCUTANEOUS

## 2019-02-21 NOTE — Progress Notes (Signed)
RN came and told me she was having trouble putting in-line suction down the ETT tube.  She felt that maybe there was a good amount of build-up and it needed thinning.  Lavaged patient, was able to thin secretions and get more mucus and plugs out as well.  Will continue to monitor.

## 2019-02-21 NOTE — Progress Notes (Signed)
NAME:  Maurice Stone, MRN:  [id number], DOB:  1940-10-27, LOS: 7 ADMISSION DATE:  02/20/2019, CONSULTATION DATE:  02/12/2019 REFERRING MD:  Dr. Daryll Drown, IMTS CHIEF COMPLAINT: altered mental status  Brief History   78 yo male found at home with altered mental status (last seen 3 days prior to admission).  Found to have fever, hypotension, hyperglycemia, and anion gap acidosis from DKA, rhabdomyolysis, AKI and sepsis with E coli bacteremia.  Past Medical History   Jehovah's Witness, COPD, CAD, Dementia, GERD, HTN, CVA, DM, OA  Significant Hospital Events   7/19 admit 7/21 transferred out of ICU 7/24 increasing O2 requirements, probable aspiration, transferred back to ICU 7/26 Extubate  Consults:  Pharmacy, nutrition, SLP, palliative, oncology  Procedures:  ETT 7/24 > 7/26 ETT 7/26 >   Significant Diagnostic Tests:  CT head 7/19 > No acute intracranial abnormality. Stable moderate generalized atrophy and mild chronic microvascular ischemic changes of the white matter. Remote lacunar strokes in the left basal ganglia. CT head 7/24 > negative. CT abd/pelvis 7/24 > multiple small lucencies throughout visualized spine and pelvis concerning for multiple myeloma or metastatic disease.  Large lucencies in both femurs.  Possible right pyelonephritis.  RML and RML opacities concerning for PNA. UPEP 7/25 >>  positive M spike Multiple myeloma panel>>  Kappa/lambda light chains>>   Micro Data:  COVD 7/19 > Negative BC 7/19 > E. Coli sensitivities pending >>  Urine 7/19 >>> E.coli, resistant to ampicillin and Augmentin FINAL COVID 7/24 >> no growth, FINAL Sputum 7/24 >> rare yeast, consistent with respiratory flora, FINAL Blood 7/24 >> NG FINAL  Antimicrobials:  Vancomycin 7/19 & 7/24 Cefepime 7/19 >> 7/21 & 7/24 >> Rocephin 7/20 >> 7/22 & 7/24 Flagyl 7/20  Interim history/subjective:  Repeat fevers to 101.2 max. Patient responding to commands overnight.   Objective    Temp:  [99.4 F (37.4 C)-102.8 F (39.3 C)] 99.4 F (37.4 C) (07/29 0745) Pulse Rate:  [52-74] 60 (07/29 0754) Resp:  [14-24] 20 (07/29 0754) BP: (113-142)/(52-61) 115/54 (07/29 0754) SpO2:  [97 %-99 %] 99 % (07/29 0600) FiO2 (%):  [40 %] 40 % (07/29 0755) Weight:  [81.5 kg] 81.5 kg (07/29 0500)    Intake/Output      07/28 0701 - 07/29 0700 07/29 0701 - 07/30 0700   I.V. (mL/kg) 1463.3 (18)    NG/GT 1770    IV Piggyback 200.2    Total Intake(mL/kg) 3433.5 (42.1)    Urine (mL/kg/hr) 2400 (1.2)    Emesis/NG output 0    Stool     Total Output 2400    Net +1033.5         Urine Occurrence 1 x      Vent Mode: CPAP;PSV FiO2 (%):  [40 %] 40 % Set Rate:  [16 bmp] 16 bmp Vt Set:  [600 mL] 600 mL PEEP:  [5 cmH20] 5 cmH20 Pressure Support:  [12 cmH20] 12 cmH20 Plateau Pressure:  [18 cmH20-20 cmH20] 20 cmH20  Examination: General: lying in bed.  Intubated.  No acute distress. HENT: PERRLA Lungs: CTAB, intubated  Cardiovascular: RRR.  Abdomen: soft NT,ND Extremities: warm and well perfused.  Neuro: Not responding to name or stimuli.  GU: Foley in place, urine clear   Resolved Problems:  DKA, Metabolic Acidosis, AKI, Rhabdomyolysis  Assessment & Plan:  Acute hypoxic respiratory failure from aspiration pneumonia. Hx of COPD. CPAP PSV today, from [facility] yesterday.  -- continue vent, wean as tolerated -- SBT today -- prn BDs --  SLP consulted  Bacteremia (7/19 Aspiration pneumonia/ventilator associated pneumonia E coli bacteremia with possible Rt pyelonephritis. Patient continues to have fevers.  UTI noted on 7/19 has been treated with cefepime.  Blood culture also with E. coli on 7/19 but further susceptibility testing has not finalized at [facility] lab is following up on this at this time.  Trach aspirate with normal respiratory flora. Persistent right upper lobe and left base mild infiltrates.  As infiltrates are not progressing, can consider nosocomial  tracheobronchitis.  Can also consider further diagnostic work-up with BAL.  As patient is high risk for MDR, can empirically treat with 2 g IV cefepime every 8 hours and 4-1/2 g Zosyn every 6 hours.   -- continue cefepime for bacteremia>> will need at least 2 weeks of ABx in setting of bacteremia -- f/u micro lab for sensitivities  -- lab called  -- LR 82m/hr  Bony lucencies on CT abd/pelvis. Concern for Multiple Myeloma  Positive M spoke  -- oncology consulted, appreciate recommendations   -- consent for bone marrow biopsy, call family (contacted oncology)  Acute metabolic encephalopathy. Hx of CVA, dementia. Baseline function per daughter is independent.  -- continue plavix, zoloft --Neuro exams -- hold outpt aricept for now  Hx of HTN, HLD. Normotensive  - continue lipitor - hold outpt norvasc, cozaar, lopressor  Hypernatremia. Hypokalemia. Hypophosphatemia Improving hyponatremia. Continue free water  - continue IV fluids - replace electrolytes - 400 cc free water q 6 hours per tube  DM with neuropathy. Resolved DKA. CBGs in 300s. 20 levemir. Levemir 20 units daily + q4 hour novolog 8 units.  -- titrate insulin as needed  -- q4 hour CBGs -- hold outpt neurontin, metformin  Moderate protein calorie malnutrition. -- will need repeat swallow study with SLP once extubated -- Continue tube feeds -- appreciate nutrition recommendations   Goals of care. See palliative care note from today.  -- Palliative care following, appreciate recommendations  -- family would like to continue to pursue aggressive measures at this time  Anemia, thrombocytopenia of critical illness. Hemoglobin 8.0 with admission Hgb 12.2. Iron levels: Fe 22, TIBC 99, ferritin 320. Platelet 185  -- f/u CBC every other day   -- Jehovah's Witness >> no blood products -- add on MVI and iron tablet once daily  -- PEG daily   Best practice:  Diet: NPO DVT prophylaxis: protonix GI  prophylaxis: SQ heparin Mobility: Bedrest. Code Status: Full Disposition: ICU  Labs:   CBC: Recent Labs  Lab 02/17/19 0115 02/18/19 0350 02/18/19 1825 02/19/19 0220 02/20/19 0234 02/21/19 0406  WBC 14.2* 16.6*  --  16.2* 14.8* 15.6*  NEUTROABS  --   --   --   --   --  12.9*  HGB 9.3* 8.7* 8.8* 7.9* 8.2* 8.0*  HCT 29.7* 27.9* 26.0* 25.1* 26.6* 25.9*  MCV 90.8 90.9  --  90.9 91.1 92.5  PLT 111* 152  --  185 229 244   CMP: Recent Labs  Lab 02/15/19 0214 02/16/19 0342  02/17/19 0115 02/17/19 1649 02/18/19 0350 02/18/19 1825 02/19/19 0220 02/20/19 0234 02/21/19 0406  NA 158* 154*   < > 151*  --  149* 155* 151* 154* 151*  K 3.7 3.8   < > 3.4*  --  3.6 3.3* 3.3* 3.7 3.5  CL 127* 122*  --  118*  --  115*  --  117* 120* 120*  CO2 22 25  --  24  --  24  --  _0 GLUCOSE 323* 292*  --  290*  --  297*  --  351* 273* 228*  BUN 38* 26*  --  22  --  25*  --  25* 24* 29*  CREATININE 1.69* 1.68*  --  1.58*  --  1.41*  --  1.53* 1.44* 1.50*  CALCIUM 8.3* 8.1*  --  7.7*  --  7.6*  --  7.7* 8.0* 7.9*  MG 2.1 1.9  --  1.5* 2.1 2.1  --  2.1  --   --   PHOS 1.9* 2.6  --   --  2.1* 1.9*  1.9*  --  1.8*  --   --   ALBUMIN  --   --   --  1.4*  --  1.3*  --   --   --   --    < > = values in this interval not displayed.   GFR: Estimated Creatinine Clearance: 44.6 mL/min (A) (by C-G formula based on SCr of 1.5 mg/dL (H)).  Liver Function Tests: Recent Labs  Lab 02/17/19 0115 02/18/19 0350  AST 71*  --   ALT 45*  --   ALKPHOS 422*  --   BILITOT 1.2  --   PROT 5.5*  --   ALBUMIN 1.4* 1.3*   Cardiac Enzymes: No results for input(s): CKTOTAL, CKMB, CKMBINDEX, TROPONINI in the last 168 hours.  CBG: Recent Labs  Lab 02/20/19 1537 02/20/19 2019 02/20/19 2358 02/21/19 0356 02/21/19 0741  GLUCAP 238* 238* 189* 179* 253*   Anemia Panel: Recent Labs    02/19/19 0220  FERRITIN 320  TIBC 99*  IRON 22*    Sepsis Labs: No results for input(s): PROCALCITON in the last  72 hours.  Invalid input(s): LACTICIDVEN Recent Labs    02/18/19 1825  HCO3 26.5   Wilber Oliphant, M.D.  PGY-2  Family Medicine  [phone] 02/21/2019 8:00 AM

## 2019-02-21 NOTE — Consult Note (Addendum)
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 including MRI brain 12/10/2016 and earlier. FINDINGS: Brain: Moderate cortical and deep atrophy, unchanged. Mild changes of small vessel disease of the white matter diffusely. Remote lacunar strokes in the LEFT basal ganglia, unchanged. No mass lesion. No midline shift. No acute hemorrhage or hematoma. No extra-axial fluid collections. No evidence of acute infarction. Vascular: Severe BILATERAL carotid siphon and vertebral artery atherosclerosis. No hyperdense vessel. Skull: No skull fracture or other focal osseous abnormality involving the skull. Sinuses/Orbits: Visualized paranasal sinuses, bilateral mastoid air cells and bilateral middle ear cavities well-aerated. Visualized orbits and globes normal in appearance. Other: None. IMPRESSION: 1. No acute intracranial abnormality. 2. Stable moderate generalized atrophy and mild chronic microvascular ischemic changes of the white matter. 3. Remote lacunar strokes in the LEFT basal ganglia. Electronically Signed   By: Evangeline Dakin M.D.   On: 02/16/2019 16:22   Ct Abdomen Pelvis W Contrast  Result Date:  02/16/2019 CLINICAL DATA:  Fever, abdominal pain. EXAM: CT ABDOMEN AND PELVIS WITH CONTRAST TECHNIQUE: Multidetector CT imaging of the abdomen and pelvis was performed using the standard protocol following bolus administration of intravenous contrast. CONTRAST:  42m OMNIPAQUE IOHEXOL 300 MG/ML  SOLN COMPARISON:  None. FINDINGS: Lower chest: Right lower lobe and middle lobe opacities are noted concerning for pneumonia. Hepatobiliary: No focal liver abnormality is seen. No gallstones, gallbladder wall thickening, or biliary dilatation. Pancreas: Unremarkable. No pancreatic ductal dilatation or surrounding inflammatory changes. Spleen: Normal in size without focal abnormality. Adrenals/Urinary Tract: Adrenal glands appear normal. No hydronephrosis or renal obstruction is noted. Urinary bladder is decompressed secondary to Foley catheter. Left kidney is unremarkable. Right renal enlargement is noted concerning for possible pyelonephritis. Multiple small possible cysts are noted. Stomach/Bowel: Nasogastric tube seen in proximal stomach. There is no evidence of bowel obstruction or inflammation. The appendix appears normal. Vascular/Lymphatic: Aortic atherosclerosis. No enlarged abdominal or pelvic lymph nodes. Reproductive: Prostate is unremarkable. Other: No abdominal wall hernia or abnormality. No abdominopelvic ascites. Musculoskeletal: Multiple small lucencies are seen throughout the visualized spine and pelvis. Large lucencies or lytic lesions are noted in the proximal femurs bilaterally. These findings are concerning for possible multiple myeloma or metastatic disease. IMPRESSION: Multiple small lucencies are seen throughout the visualized spine and pelvis concerning for multiple myeloma or metastatic disease. Large lucencies are noted in both proximal femurs which potentially may represent metastatic disease or myeloma is well. Right renal enlargement is noted with indistinct margins concerning for  pyelonephritis. Right lower and middle lobe opacities are noted concerning for pneumonia. Aortic Atherosclerosis (ICD10-I70.0). Electronically Signed   By: JMarijo ConceptionM.D.   On: 02/16/2019 15:51   Dg Chest Port 1 View  Result Date: 02/21/2019 CLINICAL DATA:  Respiratory failure. EXAM: PORTABLE CHEST 1 VIEW COMPARISON:  02/20/2019. FINDINGS: Interval removal of NG tube and placement of feeding tube. Feeding tube noted with tip below left hemidiaphragm. Endotracheal tube tip 2 cm above the carina. Heart size normal. Persistent right upper lobe and left base  infiltrates. No interim change from prior exam. No pleural effusion or pneumothorax. No acute bony abnormality. IMPRESSION: 1. Interval removal of NG tube and placement of feeding tube. Feeding tube noted with tip below left hemidiaphragm. Endotracheal tube tip 2 cm above the carina. 2. Persistent right upper lobe and left base mild infiltrates. Similar findings on prior exam. Electronically Signed   By: Marcello Moores  Register   On: 02/21/2019 08:07   Dg Chest Port 1 View  Result Date: 02/20/2019 CLINICAL DATA:  Respiratory failure EXAM: PORTABLE CHEST 1 VIEW COMPARISON:  02/18/2019 FINDINGS: Endotracheal tube 2 cm above the carina unchanged in position. NG tube enters the stomach. Improved lung volume compared to yesterday. Persistent left lower lobe airspace disease. Improved aeration in the right lung. No effusion or pneumothorax IMPRESSION: Endotracheal tube 2 cm above the carina Improved lung volume with persistent left lower lobe airspace disease. Electronically Signed   By: Franchot Gallo M.D.   On: 02/20/2019 08:29   Dg Chest Port 1 View  Result Date: 02/18/2019 CLINICAL DATA:  Intubation EXAM: PORTABLE CHEST 1 VIEW COMPARISON:  Portable exam 1749 hours compared to 02/18/2019 FINDINGS: Tip of endotracheal tube projects 3.1 cm above carina. Nasogastric tube extends into stomach. Normal heart size, mediastinal contours, and pulmonary vascularity.  RIGHT upper lobe and probable LEFT lower lobe infiltrates. Remaining lungs clear. No pleural effusion or pneumothorax. IMPRESSION: Tip of endotracheal tube 3.1 cm above carina. RIGHT upper lobe and probable LEFT lower lobe infiltrates consistent with pneumonia. Electronically Signed   By: Lavonia Dana M.D.   On: 02/18/2019 18:06   Dg Chest Port 1 View  Result Date: 02/18/2019 CLINICAL DATA:  78 year old male with history of respiratory failure. EXAM: PORTABLE CHEST 1 VIEW COMPARISON:  Chest x-ray 02/16/2019. FINDINGS: An endotracheal tube is in place with tip 2.4 cm above the carina. A nasogastric tube is seen extending into the stomach, however, the tip of the nasogastric tube extends below the lower margin of the image. Persistent but improving consolidation in the right upper lobe, indicative of resolving right upper lobe pneumonia. Left lung is clear. No pleural effusions. No evidence of pulmonary edema. Heart size is normal. Upper mediastinal contours are distorted by patient's rotation to the right. IMPRESSION: 1. Support apparatus, as above. 2. Improvement in right upper lobe pneumonia. Electronically Signed   By: Vinnie Langton M.D.   On: 02/18/2019 07:25   Dg Chest Port 1 View  Result Date: 02/16/2019 CLINICAL DATA:  Intubation EXAM: PORTABLE CHEST 1 VIEW COMPARISON:  02/16/2019, 11:51 a.m. FINDINGS: Interval placement of endotracheal tube, tip projecting over the lower trachea above the carina. Esophagogastric tube remains in unchanged position with tip and side port below the diaphragm. There has been substantial interval worsening of dense, consolidative opacity of the right upper lobe. There is additional heterogeneous opacity of the left lung base with interval loss of distinction of the left hemidiaphragm. There may be a small, layering left pleural effusion. The heart and mediastinum are unremarkable. IMPRESSION: 1. Interval placement of endotracheal tube, tip projecting over the lower  trachea above the carina. Esophagogastric tube remains in unchanged position with tip and side port below the diaphragm. 2. There has been substantial interval worsening of dense, consolidative opacity of the right upper lobe. There is additional heterogeneous opacity of the left lung base with interval loss of distinction of the left hemidiaphragm. There may be a small, layering left pleural effusion. Findings are generally consistent with worsened multifocal infection. 3.  The heart  and mediastinum are unremarkable. Electronically Signed   By: Eddie Candle M.D.   On: 02/16/2019 19:21   Dg Chest Port 1 View  Result Date: 02/16/2019 CLINICAL DATA:  Hypoxia. EXAM: PORTABLE CHEST 1 VIEW COMPARISON:  February 11, 2019 FINDINGS: Enteric catheter likely terminates within gastric body. Cardiomediastinal silhouette is normal. Mediastinal contours appear intact. Chronic elevation of the right hemidiaphragm. Patchy airspace consolidation throughout the right lung and within the left lung base. Osseous structures are without acute abnormality. Soft tissues are grossly normal. IMPRESSION: Patchy airspace consolidation throughout the right lung and within the left lung base, concerning for multifocal pneumonia. Electronically Signed   By: Fidela Salisbury M.D.   On: 02/16/2019 12:16   Dg Chest Port 1 View  Result Date: 02/07/2019 CLINICAL DATA:  Sepsis. EXAM: PORTABLE CHEST 1 VIEW COMPARISON:  Radiographs of May 01, 2017. FINDINGS: Stable cardiomediastinal silhouette. No pneumothorax or pleural effusion is noted. Both lungs are clear. The visualized skeletal structures are unremarkable. IMPRESSION: No active disease. Electronically Signed   By: Marijo Conception M.D.   On: 02/06/2019 15:28   Dg Abd Portable 1v  Result Date: 02/13/2019 CLINICAL DATA:  Feeding tube placement EXAM: PORTABLE ABDOMEN - 1 VIEW COMPARISON:  None. FINDINGS: Enteric tube appears adequately positioned in the stomach. Overall bowel gas pattern  is nonobstructive. IMPRESSION: Enteric tube appears adequately positioned in the stomach. Electronically Signed   By: Franki Cabot M.D.   On: 02/13/2019 11:49    Assessment and Plan:  1.  Elevated M spike and light chains in the urine 2.  Bone lucencies noted on CT scan 3.  Acute hypoxic respiratory failure from aspiration pneumonia 4.  Bacteremia 5.  Leukocytosis 6.  Anemia 7.  Hypernatremia 8.  Mild renal insufficiency 9.  Goals of care  -Spoke with daughter regarding his abnormal findings on the CT scan and lab work.  Recommend proceeding with a bone marrow biopsy.  I have explained the procedure to her over the phone and she sent a group chat to her siblings who all would like to proceed with a bone marrow biopsy.  I have reached out to Wilber Bihari, NP from the cancer center who will perform the bone marrow biopsy on Friday 02/23/2019 at 8 AM. -We discussed his overall poor performance status at this time.  We discussed that he will need to recover from his acute illness before we would consider treatment if the bone marrow biopsy does show multiple myeloma. -Ongoing management of respiratory failure and bacteremia per PCCM. -The patient has anemia.  Ferritin was normal.  He has a Jehovah's Witness and will not accept blood transfusions.  Monitor CBC every other day. -The patient is being followed by the palliative care team and the family has indicated they want full scope care.  Palliative care team is continuing ongoing discussions.  Thank you for this referral.   Maurice Bussing, DNP, AGPCNP-BC, AOCNP  Addendum  I have seen the patient, examined him. I agree with the assessment and and plan and have edited the notes.   Lab and scan reviewed. He does have M-protein 0.7 on SPEP, and Bence Jones protein in urine,Kappa type, urine M-protein 1.1g/24h.Given the CT scan lytic bone lesion, this is highly suspicious for multiple myeloma, especially kappa light chain disease. On  admission, pt had no anemia,hypercalcemia, his AKI was probably related to DKA. So I think his acute illness are not directly related to his MM, could be indirectly related to his immuno  compromised status due to his MM. Anyway, even if I treat his MM, I do not think he will recover quickly,to regain his function status and quality of life. In any event, I think it's still reasonable to have a bone marrow biopsy to make the definitive diagnosis, and I will discuss with his children about prognosis and treatment options. Again I think his overall prognosis is guarded, comfort care may still be the best choice for him if he does not recover from his respiratory failure.   His PSA this morning came back normal, metastatic prostate cancer is ruled out.   Bone marrow biopsy by our NP Maurice Stone on Friday morning 8am. I hope I have some preliminary result by Friday afternoon, but could be Monday also.   Maurice Stone  02/21/2019

## 2019-02-22 ENCOUNTER — Inpatient Hospital Stay (HOSPITAL_COMMUNITY): Payer: Medicare Other

## 2019-02-22 LAB — KAPPA/LAMBDA LIGHT CHAINS
Kappa free light chain: 605.3 mg/L — ABNORMAL HIGH (ref 3.3–19.4)
Kappa, lambda light chain ratio: 4.88 — ABNORMAL HIGH (ref 0.26–1.65)
Lambda free light chains: 124.1 mg/L — ABNORMAL HIGH (ref 5.7–26.3)

## 2019-02-22 LAB — CBC
HCT: 24.5 % — ABNORMAL LOW (ref 39.0–52.0)
Hemoglobin: 7.4 g/dL — ABNORMAL LOW (ref 13.0–17.0)
MCH: 27.8 pg (ref 26.0–34.0)
MCHC: 30.2 g/dL (ref 30.0–36.0)
MCV: 92.1 fL (ref 80.0–100.0)
Platelets: 261 10*3/uL (ref 150–400)
RBC: 2.66 MIL/uL — ABNORMAL LOW (ref 4.22–5.81)
RDW: 16.7 % — ABNORMAL HIGH (ref 11.5–15.5)
WBC: 11.2 10*3/uL — ABNORMAL HIGH (ref 4.0–10.5)
nRBC: 0.4 % — ABNORMAL HIGH (ref 0.0–0.2)

## 2019-02-22 LAB — PHOSPHORUS: Phosphorus: 2.4 mg/dL — ABNORMAL LOW (ref 2.5–4.6)

## 2019-02-22 LAB — BASIC METABOLIC PANEL
Anion gap: 9 (ref 5–15)
BUN: 32 mg/dL — ABNORMAL HIGH (ref 8–23)
CO2: 23 mmol/L (ref 22–32)
Calcium: 7.7 mg/dL — ABNORMAL LOW (ref 8.9–10.3)
Chloride: 119 mmol/L — ABNORMAL HIGH (ref 98–111)
Creatinine, Ser: 1.17 mg/dL (ref 0.61–1.24)
GFR calc Af Amer: >60 mL/min (ref >60)
GFR calc non Af Amer: >60 mL/min (ref >60)
Glucose, Bld: 182 mg/dL — ABNORMAL HIGH (ref 70–99)
Potassium: 3.6 mmol/L (ref 3.5–5.1)
Sodium: 151 mmol/L — ABNORMAL HIGH (ref 135–145)

## 2019-02-22 LAB — GLUCOSE, CAPILLARY
Glucose-Capillary: 118 mg/dL — ABNORMAL HIGH (ref 70–99)
Glucose-Capillary: 132 mg/dL — ABNORMAL HIGH (ref 70–99)
Glucose-Capillary: 149 mg/dL — ABNORMAL HIGH (ref 70–99)
Glucose-Capillary: 167 mg/dL — ABNORMAL HIGH (ref 70–99)
Glucose-Capillary: 174 mg/dL — ABNORMAL HIGH (ref 70–99)
Glucose-Capillary: 180 mg/dL — ABNORMAL HIGH (ref 70–99)
Glucose-Capillary: 74 mg/dL (ref 70–99)

## 2019-02-22 LAB — MAGNESIUM: Magnesium: 2 mg/dL (ref 1.7–2.4)

## 2019-02-22 MED ORDER — INSULIN ASPART 100 UNIT/ML ~~LOC~~ SOLN
5.0000 [IU] | SUBCUTANEOUS | Status: DC
  Administered 2019-02-22 – 2019-03-01 (×34): 5 [IU] via SUBCUTANEOUS

## 2019-02-22 MED ORDER — FREE WATER
300.0000 mL | Status: DC
  Administered 2019-02-22 – 2019-03-02 (×48): 300 mL

## 2019-02-22 MED ORDER — POLYETHYLENE GLYCOL 3350 17 G PO PACK: 17.0000 g | PACK | Freq: Every day | ORAL | Status: DC | PRN

## 2019-02-22 MED ORDER — INSULIN DETEMIR 100 UNIT/ML ~~LOC~~ SOLN
25.0000 [IU] | Freq: Every day | SUBCUTANEOUS | Status: DC
  Administered 2019-02-22 – 2019-03-01 (×8): 25 [IU] via SUBCUTANEOUS
  Filled 2019-02-22 (×9): qty 0.25

## 2019-02-22 MED ORDER — COLLAGENASE 250 UNIT/GM EX OINT
TOPICAL_OINTMENT | Freq: Every day | CUTANEOUS | Status: DC
  Administered 2019-02-22 – 2019-02-25 (×4): via TOPICAL
  Administered 2019-02-26: 1 via TOPICAL
  Administered 2019-02-27 – 2019-03-01 (×3): via TOPICAL
  Filled 2019-02-22 (×2): qty 30

## 2019-02-22 NOTE — Progress Notes (Signed)
Spoke with Dr. Burr Medico from oncology discussing that family would like to postpone biopsy until they meet with palliative care.  Dr. Burr Medico is understanding.  In short, she does not believe that delaying bone marrow biopsy will change patient's course significantly according to his lab numbers.  Dr. Burr Medico has volunteered to join the family meeting if available at that time as she is on call this weekend.  Also spoke with Threasa Beards from palliative care and reviewed this information.  She is going to reach out to the provider that will be contacting the family meeting this weekend to get more information about scheduling.  I also let them know about the family's decision to postpone the bone marrow biopsy until after the meeting.  Primary team is deferring to palliative care to speaking with the family about goals of care.  We will continue to update palliative care about patient's medical findings and will also obtain information from oncology so that there is streamline communication.  This will limit any confusion with communication.  Wilber Oliphant, M.D.  PGY-2  Family Medicine  [phone] 02/22/2019 2:25 PM

## 2019-02-22 NOTE — Consult Note (Signed)
[facility] Nurse wound consult note Reason for Consult: Wound type: Pressure Injury POA: Yes/No/NA Measurement: Wound bed: Drainage (amount, consistency, odor)  Periwound: Dressing procedure/placement/frequency: Julien Girt MSN, RN, [place], [place], [place]

## 2019-02-22 NOTE — Progress Notes (Signed)
Pt's daughter called and informed that she and siblings have reached a consensus that they want all non-blood administration options to be utilized first, but all approve of using any blood product if necessary. Phone consent witnessed by [employer].

## 2019-02-22 NOTE — Consult Note (Addendum)
[facility] Nurse wound consult note Reason for Consult:Consult requested for several locations.  Pt was found down prior to admission for several days. The [facility] team is working remotely today; consult was performed after a review of the progress notes and a remote camera system with assistance from the bediside nurse for assessment and measurements.  Deep tissue pressure injuries (DTPI) were noted in the H&P but have declined. Pt has multiple systemic factors which can impair healing and is critically ill.  Wound type: Left heel with stage 1 pressure injury, red and nonblanchable, 2.5X2cm Right posterior shoulder/back with previous DTPI has evolved to stage 2; 5X4X.1cm, lighter-colored dry skin Sacrum with stage 2 pressure injury, evolving into a stage 3; 2X2X.1cm, red and moist Right buttock previously noted DTPI has evolved into Unstageable, 95% tightly adhered eschar, 9X10cm. Pressure Injury POA: Yes Dressing procedure/placement/frequency: Foam dressing to protect and promote healing to right posterior shoulder and left heel.  Float heels to reduce pressure.  Pt is on a low airloss mattress to reduce pressure.  Santyl ointment to provide enzymatic debridement of nonviable tissue to buttocks and sacrum wound, Physical therapy to begin hydrotherapy to assist with removal of nonviable tissue to right buttock wound.   [facility] team will assess the location weekly with PT to determine if a change is topical treatment is needed at that time. Julien Girt MSN, RN, [place], [place], [place]

## 2019-02-22 NOTE — Progress Notes (Addendum)
NAME:  Maurice Stone, MRN:  [id number], DOB:  03-17-1941, LOS: 7 ADMISSION DATE:  02/08/2019, CONSULTATION DATE:  02/12/2019 REFERRING MD:  Dr. Daryll Drown, IMTS CHIEF COMPLAINT: altered mental status  Brief History   78 yo male found at home with altered mental status (last seen 3 days prior to admission).  Found to have fever, hypotension, hyperglycemia, and anion gap acidosis from DKA, rhabdomyolysis, AKI and sepsis with E coli bacteremia.  Past Medical History   Jehovah's Witness, COPD, CAD, Dementia, GERD, HTN, CVA, DM, OA  Significant Hospital Events   7/19 admit 7/21 transferred out of ICU 7/24 increasing O2 requirements, probable aspiration, transferred back to ICU 7/26 Extubate  Consults:  Pharmacy, nutrition, SLP, palliative, oncology  Procedures:  ETT 7/24 > 7/26 ETT 7/26 >   Significant Diagnostic Tests:  CT head 7/19 > No acute intracranial abnormality. Stable moderate generalized atrophy and mild chronic microvascular ischemic changes of the white matter. Remote lacunar strokes in the left basal ganglia. CT head 7/24 > negative. CT abd/pelvis 7/24 > multiple small lucencies throughout visualized spine and pelvis concerning for multiple myeloma or metastatic disease.  Large lucencies in both femurs.  Possible right pyelonephritis.  RML and RML opacities concerning for PNA. UPEP 7/25 >>  positive M spike Multiple myeloma panel>>  Kappa/lambda light chains>>  Micro Data:  COVD 7/19 > Negative BC 7/19 > E. Coli sensitivities pending >>  Urine 7/19 >>> E.coli, resistant to ampicillin and Augmentin FINAL COVID 7/24 >> no growth, FINAL Sputum 7/24 >> rare yeast, consistent with respiratory flora, FINAL Blood 7/24 >> NG FINAL  Antimicrobials:  Vancomycin 7/19 & 7/24 Cefepime 7/19 >> 7/21 & 7/24 >>7/30 Rocephin 7/20 >> 7/22 & 7/24 Flagyl 7/20  Interim history/subjective:  Much more alert this morning.   Objective   Temp:  [98.2 F (36.8 C)-99.8 F  (37.7 C)] 98.2 F (36.8 C) (07/30 0746) Pulse Rate:  [53-88] 58 (07/30 0600) Resp:  [17-31] 20 (07/30 0600) BP: (106-129)/(47-72) 111/53 (07/30 0600) SpO2:  [95 %-100 %] 98 % (07/30 0746) FiO2 (%):  [40 %] 40 % (07/30 0746) Weight:  [86 kg] 86 kg (07/30 0500)      Intake/Output Summary (Last 24 hours) at 02/22/2019 1054 Last data filed at 02/22/2019 1000 Gross per 24 hour  Intake 3394.56 ml  Output 1120 ml  Net 2274.56 ml   Vent Mode: PSV;CPAP FiO2 (%):  [40 %] 40 % Set Rate:  [16 bmp] 16 bmp Vt Set:  [600 mL] 600 mL PEEP:  [5 cmH20] 5 cmH20 Pressure Support:  [10 cmH20] 10 cmH20 Plateau Pressure:  [18 cmH20-23 cmH20] 23 cmH20  Examination: General: lying in bed.  Intubated.  No acute distress. Awake and alert.  HENT: PERRLA Lungs: CTAB, intubated, PSV.  Cardiovascular: RRR.  Abdomen: soft NT,ND Extremities: Right hand grip 2/5, left hand 5/5. warm and well perfused.  Neuro: No obvious CN deficits. Strength as above.  GU: Foley in place, urine clear   Resolved Problems:  DKA, Metabolic Acidosis, AKI, Rhabdomyolysis  Assessment & Plan:  Acute hypoxic respiratory failure from aspiration pneumonia. Hx of COPD. CPAP PSV over x 2 days. More alert and likely will be able to extubate later on today or tomorrow.   -- continue vent, wean as tolerated -- SBT today -- prn BDs -- SLP consulted  Bacteremia (7/19 Aspiration pneumonia/ventilator associated pneumonia E coli bacteremia with possible Rt pyelonephritis. No fevers in the last 24 hours.  7/19 culture sensitivities returned this morning  with e coli resistant to ampicillin and amp/sulbactam. Sensitive to cefazolin and CTX. Chest x-ray stable.  7/25 blood cultures NG x 5 days. Patient has been improving on cefepime and is on day 7 today, which is appropriate length for patient's ecoli bacteremia.  -- If fevers, repeat chest x-ray, collect blood cultures.  -- d/c cefepime today (day 7)  Concern for Multiple Myeloma   Bone marrow biopsy scheduled for 7/30 at 8AM -- continue to monitor CBC every other day.  -- Jehovah's witness  -- oncology consulted, appreciate recommendations    Acute metabolic encephalopathy. Hx of CVA, dementia. Baseline function per daughter is independent.  -- continue plavix, zoloft --Neuro exams -- hold outpt aricept for now  Hx of HTN, HLD. Normotensive  - continue lipitor - hold outpt norvasc, cozaar, lopressor  DM with neuropathy. Resolved DKA. CBGs in 300s. 20 levemir.  Increase Levemir to 25 units daily from 20 yesterday.  Decrease NovoLog to 5 units every 4 hours plus sliding scale every 4 hours. -- titrate insulin as needed  -- q4 hour CBGs -- hold outpt neurontin, metformin  Hypernatremia. Hypokalemia. Hypophosphatemia Improving hyponatremia. Increasing free water today - continue IV fluids - replace electrolytes - 300 cc free water q 4 hours per tube  Moderate protein calorie malnutrition. -- will need repeat swallow study with SLP once extubated -- Continue tube feeds -- appreciate nutrition recommendations   Goals of care. Improving mental status. Will continue to discuss goals of care.  -- appreciate palliative recommendations   Anemia, thrombocytopenia of critical illness. Hemoglobin 7.4 with admission Hgb 12.2.  -- f/u CBC every other day   -- Jehovah's Witness >> no blood products -- add on MVI and iron tablet once daily  -- PEG daily   Best practice:  Diet: NPO, tube feeds DVT prophylaxis: protonix GI prophylaxis: SQ heparin Mobility: Bedrest. Code Status: Full Disposition: ICU  Labs:   CBC: Recent Labs  Lab 02/18/19 0350 02/18/19 1825 02/19/19 0220 02/20/19 0234 02/21/19 0406 02/22/19 0221  WBC 16.6*  --  16.2* 14.8* 15.6* 11.2*  NEUTROABS  --   --   --   --  12.9*  --   HGB 8.7* 8.8* 7.9* 8.2* 8.0* 7.4*  HCT 27.9* 26.0* 25.1* 26.6* 25.9* 24.5*  MCV 90.9  --  90.9 91.1 92.5 92.1  PLT 152  --  185 229 244 261    CMP: Recent Labs  Lab 02/16/19 0342  02/17/19 0115 02/17/19 1649 02/18/19 0350 02/18/19 1825 02/19/19 0220 02/20/19 0234 02/21/19 0406 02/22/19 0221  NA 154*   < > 151*  --  149* 155* 151* 154* 151* 151*  K 3.8   < > 3.4*  --  3.6 3.3* 3.3* 3.7 3.5 3.6  CL 122*  --  118*  --  115*  --  117* 120* 120* 119*  CO2 25  --  24  --  24  --  '24 24 24 23  ' GLUCOSE 292*  --  290*  --  297*  --  351* 273* 228* 182*  BUN 26*  --  22  --  25*  --  25* 24* 29* 32*  CREATININE 1.68*  --  1.58*  --  1.41*  --  1.53* 1.44* 1.50* 1.17  CALCIUM 8.1*  --  7.7*  --  7.6*  --  7.7* 8.0* 7.9* 7.7*  MG 1.9  --  1.5* 2.1 2.1  --  2.1  --   --  2.0  PHOS 2.6  --   --  2.1* 1.9*  1.9*  --  1.8*  --   --  2.4*  ALBUMIN  --   --  1.4*  --  1.3*  --   --   --   --   --    < > = values in this interval not displayed.   GFR: Estimated Creatinine Clearance: 57.2 mL/min (by C-G formula based on SCr of 1.17 mg/dL).  Liver Function Tests: Recent Labs  Lab 02/17/19 0115 02/18/19 0350  AST 71*  --   ALT 45*  --   ALKPHOS 422*  --   BILITOT 1.2  --   PROT 5.5*  --   ALBUMIN 1.4* 1.3*   CBG: Recent Labs  Lab 02/21/19 1524 02/21/19 2028 02/21/19 2359 02/22/19 0335 02/22/19 0755  GLUCAP 136* 156* 149* 174* 167*   Wilber Oliphant, M.D.  PGY-2  Family Medicine  [phone] 02/22/2019 8:52 AM

## 2019-02-22 NOTE — Progress Notes (Signed)
Pharmacy Antibiotic Note  Maurice Stone is a 78 y.o. male admitted on 02/20/2019 with e.coli bacteremia. Patient with persistent fevers, and possible lung changes on xray. Patient with e.coli in blood cultures from 7/19. I spoke with [facility], today 7/30, they state they will have the susceptibilities available later today. Despite this, patient has received cefepime since 7/24, today is day #7.    Plan: Continue cefepime 2g q 12 hrs - recommend discontinuing as patient has now received full course Monitor cultures, renal function    Height: 5\' 11"  (180.3 cm) Weight: 189 lb 9.5 oz (86 kg) IBW/kg (Calculated) : 75.3  Temp (24hrs), Avg:98.9 F (37.2 C), Min:98.2 F (36.8 C), Max:99.8 F (37.7 C)  Recent Labs  Lab 02/16/19 1907 02/16/19 2249  02/18/19 0350 02/19/19 0220 02/20/19 0234 02/21/19 0406 02/22/19 0221  WBC  --   --    < > 16.6* 16.2* 14.8* 15.6* 11.2*  CREATININE  --   --    < > 1.41* 1.53* 1.44* 1.50* 1.17  LATICACIDVEN 2.2* 2.2*  --   --   --   --   --   --    < > = values in this interval not displayed.     Antimicrobials this admission: Cefepime 7/19 >>7/20, 7/24 >  Vanc 7/19 >> 7/19, 7/24>>7/24 CTX 7/20 >>7/23. 7/24>>7/24 Cefazolin 7/23>7/24   Microbiology results: 7/19 BCx: E.coli 7/19 UCx: 3K e. Osa [name] 7/19 COVID PCR: neg    Maurice Stone, Maurice Stone 02/22/2019 8:33 AM

## 2019-02-22 NOTE — Progress Notes (Signed)
I attempted to call patient daughter, Dominica Severin at [phone] to review tomorrow's procedure and to obtain consent to perform the bone marrow biopsy.  I left a message and requested that she call me back at the cancer center at [phone].  Wilber Bihari, NP

## 2019-02-23 DIAGNOSIS — C9000 Multiple myeloma not having achieved remission: Secondary | ICD-10-CM

## 2019-02-23 LAB — CBC
HCT: 23.8 % — ABNORMAL LOW (ref 39.0–52.0)
Hemoglobin: 7.3 g/dL — ABNORMAL LOW (ref 13.0–17.0)
MCH: 28 pg (ref 26.0–34.0)
MCHC: 30.7 g/dL (ref 30.0–36.0)
MCV: 91.2 fL (ref 80.0–100.0)
Platelets: 256 10*3/uL (ref 150–400)
RBC: 2.61 MIL/uL — ABNORMAL LOW (ref 4.22–5.81)
RDW: 16.7 % — ABNORMAL HIGH (ref 11.5–15.5)
WBC: 10.2 10*3/uL (ref 4.0–10.5)
nRBC: 0.5 % — ABNORMAL HIGH (ref 0.0–0.2)

## 2019-02-23 LAB — BASIC METABOLIC PANEL WITH GFR
Anion gap: 8 (ref 5–15)
BUN: 32 mg/dL — ABNORMAL HIGH (ref 8–23)
CO2: 24 mmol/L (ref 22–32)
Calcium: 7.7 mg/dL — ABNORMAL LOW (ref 8.9–10.3)
Chloride: 117 mmol/L — ABNORMAL HIGH (ref 98–111)
Creatinine, Ser: 1.21 mg/dL (ref 0.61–1.24)
GFR calc Af Amer: >60 mL/min
GFR calc non Af Amer: 58 mL/min — ABNORMAL LOW
Glucose, Bld: 212 mg/dL — ABNORMAL HIGH (ref 70–99)
Potassium: 3.8 mmol/L (ref 3.5–5.1)
Sodium: 149 mmol/L — ABNORMAL HIGH (ref 135–145)

## 2019-02-23 LAB — MULTIPLE MYELOMA PANEL, SERUM
Albumin SerPl Elph-Mcnc: 1.6 g/dL — ABNORMAL LOW (ref 2.9–4.4)
Albumin/Glob SerPl: 0.4 — ABNORMAL LOW (ref 0.7–1.7)
Alpha 1: 0.4 g/dL (ref 0.0–0.4)
Alpha2 Glob SerPl Elph-Mcnc: 1.3 g/dL — ABNORMAL HIGH (ref 0.4–1.0)
B-Globulin SerPl Elph-Mcnc: 0.7 g/dL (ref 0.7–1.3)
Gamma Glob SerPl Elph-Mcnc: 1.9 g/dL — ABNORMAL HIGH (ref 0.4–1.8)
Globulin, Total: 4.4 g/dL — ABNORMAL HIGH (ref 2.2–3.9)
IgA: 395 mg/dL (ref 61–437)
IgG (Immunoglobin G), Serum: 1870 mg/dL — ABNORMAL HIGH (ref 603–1613)
IgM (Immunoglobulin M), Srm: 115 mg/dL (ref 15–143)
M Protein SerPl Elph-Mcnc: 0.7 g/dL — ABNORMAL HIGH
Total Protein ELP: 6 g/dL (ref 6.0–8.5)

## 2019-02-23 LAB — SUSCEPTIBILITY, AER + ANAEROB

## 2019-02-23 LAB — GLUCOSE, CAPILLARY
Glucose-Capillary: 195 mg/dL — ABNORMAL HIGH (ref 70–99)
Glucose-Capillary: 210 mg/dL — ABNORMAL HIGH (ref 70–99)
Glucose-Capillary: 223 mg/dL — ABNORMAL HIGH (ref 70–99)
Glucose-Capillary: 226 mg/dL — ABNORMAL HIGH (ref 70–99)
Glucose-Capillary: 180 mg/dL — ABNORMAL HIGH (ref 70–99)

## 2019-02-23 LAB — SUSCEPTIBILITY RESULT

## 2019-02-23 LAB — MAGNESIUM: Magnesium: 2 mg/dL (ref 1.7–2.4)

## 2019-02-23 MED ORDER — FENTANYL CITRATE (PF) 100 MCG/2ML IJ SOLN
INTRAMUSCULAR | Status: AC
  Filled 2019-02-23: qty 2

## 2019-02-23 MED ORDER — FENTANYL CITRATE (PF) 100 MCG/2ML IJ SOLN
25.0000 ug | INTRAMUSCULAR | Status: AC | PRN
  Administered 2019-02-23 (×3): 25 ug via INTRAVENOUS
  Filled 2019-02-23: qty 2

## 2019-02-23 MED ORDER — FENTANYL CITRATE (PF) 100 MCG/2ML IJ SOLN
25.0000 ug | INTRAMUSCULAR | Status: DC | PRN
  Administered 2019-02-23: 100 ug via INTRAVENOUS
  Administered 2019-02-23: 25 ug via INTRAVENOUS
  Administered 2019-02-24: 50 ug via INTRAVENOUS
  Filled 2019-02-23: qty 2

## 2019-02-23 NOTE — Progress Notes (Addendum)
CSW received call from [facility] stating this has been reviewed and deemed appropriate for LTAC. CSW and Dr. Maudie Mercury spoke regarding [facility] [place] admission, there is a family meeting scheduled for tomorrow where this topic will be discussed.  Madilyn Fireman, MSW, LCSW-A Clinical Social Worker [facility] [phone]

## 2019-02-23 NOTE — Progress Notes (Signed)
Hem/Onc brief note  I reviewed his chart and lab results, based on SPEP/IFE and light chain level, his MM diagnosis is suspected but not definitive and bone marrow is need to make the final diagnosis. Family meeting is planned for tomorrow morning. I called pt's daughter Maurice Stone and discussed the above, and I agree with postponing his BM biopsy per family request. I asked Maurice Stone if they want me to participate the family meeting tomorrow and Maurice Stone does not think so. I will follow up as needed during his hospital stay.  [name]  02/23/2019

## 2019-02-23 NOTE — Progress Notes (Signed)
Spoke with Sheliah Plane on the phone this morning.  The family does not want to pursue a bone marrow biopsy at this time.  They understand he would have to improve from his current illness before treatment could be started, so they will post pone biopsy until he has improved.  A meeting has been scheduled for tomorrow morning 8/1 at 9:00.    The 4 people attending the meeting are his four children:  Maurice Stone, Maurice Stone, Maurice Stone, and Maurice Stone.    I will give those names to entrance security.   Florentina Jenny, PA-C Palliative Medicine Pager: [phone]  No charge note

## 2019-02-23 NOTE — Progress Notes (Addendum)
NAME:  Maurice Stone, MRN:  [id number], DOB:  Oct 14, 1940, LOS: 7 ADMISSION DATE:  02/06/2019, CONSULTATION DATE:  02/12/2019 REFERRING MD:  Dr. Daryll Drown, IMTS CHIEF COMPLAINT: altered mental status  Brief History   78 yo male found at home with altered mental status (last seen 3 days prior to admission).  Found to have fever, hypotension, hyperglycemia, and anion gap acidosis from DKA, rhabdomyolysis, AKI and sepsis with E coli bacteremia.  Past Medical History   Jehovah's Witness (wiling to accept blood products if necessary for life), COPD, CAD, Dementia, GERD, HTN, CVA, DM, [facility] Events   7/19 admit 7/21 transferred out of ICU 7/24 increasing O2 requirements, probable aspiration, transferred back to ICU 7/26 Extubated, reintubated 6 hours later   Consults:  Pharmacy, nutrition, SLP, palliative, oncology  Procedures:  ETT 7/24 > 7/26 ETT 7/26 >   Significant Diagnostic Tests:  CT head 7/19 > No acute intracranial abnormality. Stable moderate generalized atrophy and mild chronic microvascular ischemic changes of the white matter. Remote lacunar strokes in the left basal ganglia. CT head 7/24 > negative. CT abd/pelvis 7/24 > multiple small lucencies throughout visualized spine and pelvis concerning for multiple myeloma or metastatic disease.  Large lucencies in both femurs.  Possible right pyelonephritis.  RML and RML opacities concerning for PNA. UPEP 7/25 >>  positive M spike Multiple myeloma panel>> pending Kappa/lambda light chains>> Kappa free light chain 65.3, lambda free light chain 124.1, kappa lambda light chain ratio 4.88  Micro Data:  COVD 7/19 > Negative BC 7/19 > E. Coli sensitivities pending >>  Urine 7/19 >>> E.coli, resistant to ampicillin and Augmentin FINAL COVID 7/24 >> no growth, FINAL Sputum 7/24 >> rare yeast, consistent with respiratory flora, FINAL Blood 7/24 >> NG FINAL  Antimicrobials:  Vancomycin 7/19 & 7/24 Cefepime  7/19 >> 7/21 & 7/24 >>7/30 Rocephin 7/20 >> 7/22 & 7/24 Flagyl 7/20  Interim history/subjective:  No acute events overnight.  Less alert this morning. Objective   Blood pressure (!) 112/59, pulse (!) 53, temperature (!) 97.4 F (36.3 C), temperature source Axillary, resp. rate 16, height '5\' 11"'  (1.803 m), weight 85.7 kg, SpO2 99 %.    Intake/Output Summary (Last 24 hours) at 02/23/2019 0835 Last data filed at 02/23/2019 0600 Gross per 24 hour  Intake 2847.69 ml  Output 1300 ml  Net 1547.69 ml   Vent Mode: PRVC FiO2 (%):  [40 %] 40 % Set Rate:  [16 bmp] 16 bmp Vt Set:  [600 mL] 600 mL PEEP:  [5 cmH20] 5 cmH20 Pressure Support:  [10 cmH20] 10 cmH20 Plateau Pressure:  [18 cmH20-21 cmH20] 19 cmH20  Examination: General: lying in bed.  Intubated.  No acute distress.  Sleepy this morning.  Not following commands HENT: PERRLA Lungs: CTAB, intubated, PRVC.  Cardiovascular: RRR.  Abdomen: soft NT,ND Extremities: Warm and well perfused Neuro: No obvious CN deficits. Strength as above.  GU: Foley in place, urine clear   Resolved Problems:  DKA, Metabolic Acidosis, AKI, Rhabdomyolysis  Assessment & Plan:  Acute hypoxic respiratory failure from aspiration pneumonia. Hx of COPD. [facility] back to [facility]  Currently 40% FiO2 PEEP 5, pressure support 10, plateau pressure 19.  Precedex running at 0.8 patient is sleepy this morning and not answering commands.  He does open his eyes to name.  -- continue vent, wean as tolerated -- prn BDs  -- talk to the family about extubating   Concern for Multiple Myeloma  Bone marrow biopsy delayed until  after palliative care meeting per family wishes.  Family has also changed mind about blood products.  They are willing to accept any and all blood products in the event of urgency. --Oncology following, appreciate recommendations  Acute metabolic encephalopathy. Hx of CVA, dementia. Baseline function per daughter is independent.  Though  this is questionable given patient's sacrum pressure injury on admission. Unclear as to why patient went off of precedex.  -- continue plavix, zoloft --Neuro exams -- hold outpt aricept for now  Hx of HTN, HLD. Normotensive  - continue lipitor - hold outpt norvasc, cozaar, lopressor  DM with neuropathy. Resolved DKA. CBGs in 300s. 20 levemir.  Increase Levemir to 25 units daily from 20 yesterday.  Decrease NovoLog to 5 units every 4 hours plus sliding scale every 4 hours. -- titrate insulin as needed  -- q4 hour CBGs -- hold outpt neurontin, metformin  Hypernatremia. Hypokalemia. Hypophosphatemia Hypernatremia continues to improve.  Increase free water intake to every 4 hours yesterday. - replace electrolytes - 300 cc free water q 4 hours per tube  Moderate protein calorie malnutrition. -- will need repeat swallow study with SLP once extubated -- Continue tube feeds -- appreciate nutrition recommendations   Goals of care. Family meeting this weekend.  Family will wait until family meeting today to discuss whether they would like patient to receive bone marrow biopsy.  We will follow-up on meeting time and let Dr. Burr Medico know as she is on call this weekend and expressed interest in being at the meeting if she is able.  Palliative care is leaving the discussion with family about goals of care. -- appreciate palliative recommendations   Anemia, thrombocytopenia of critical illness. Hemoglobin 7.3 with admission Hgb 12.2.  -- f/u CBC every other day   -- Jehovah's Witness >> blood products if necessary -- add on MVI and iron tablet once daily  -- PEG daily   Bacteremia, resolved No fevers x48 hours.  Patient's cultures from 7/19 sensitive to pain and repeat blood cultures are negative, final result.  Status post cefepime x7 days.  Best practice:  Diet: NPO, tube feeds DVT prophylaxis: protonix GI prophylaxis: SQ heparin Mobility: Bedrest. Code Status: Full  Disposition: ICU  Labs:   CBC: Recent Labs  Lab 02/19/19 0220 02/20/19 0234 02/21/19 0406 02/22/19 0221 02/23/19 0227  WBC 16.2* 14.8* 15.6* 11.2* 10.2  NEUTROABS  --   --  12.9*  --   --   HGB 7.9* 8.2* 8.0* 7.4* 7.3*  HCT 25.1* 26.6* 25.9* 24.5* 23.8*  MCV 90.9 91.1 92.5 92.1 91.2  PLT 185 229 244 261 256   CMP: Recent Labs  Lab 02/17/19 0115 02/17/19 1649 02/18/19 0350  02/19/19 0220 02/20/19 0234 02/21/19 0406 02/22/19 0221 02/23/19 0227  NA 151*  --  149*   < > 151* 154* 151* 151* 149*  K 3.4*  --  3.6   < > 3.3* 3.7 3.5 3.6 3.8  CL 118*  --  115*  --  117* 120* 120* 119* 117*  CO2 24  --  24  --  '24 24 24 23 24  ' GLUCOSE 290*  --  297*  --  351* 273* 228* 182* 212*  BUN 22  --  25*  --  25* 24* 29* 32* 32*  CREATININE 1.58*  --  1.41*  --  1.53* 1.44* 1.50* 1.17 1.21  CALCIUM 7.7*  --  7.6*  --  7.7* 8.0* 7.9* 7.7* 7.7*  MG 1.5* 2.1 2.1  --  2.1  --   --  2.0 2.0  PHOS  --  2.1* 1.9*  1.9*  --  1.8*  --   --  2.4*  --   ALBUMIN 1.4*  --  1.3*  --   --   --   --   --   --    < > = values in this interval not displayed.   GFR: Estimated Creatinine Clearance: 55.3 mL/min (by C-G formula based on SCr of 1.21 mg/dL).  Liver Function Tests: Recent Labs  Lab 02/17/19 0115 02/18/19 0350  AST 71*  --   ALT 45*  --   ALKPHOS 422*  --   BILITOT 1.2  --   PROT 5.5*  --   ALBUMIN 1.4* 1.3*   CBG: Recent Labs  Lab 02/22/19 1538 02/22/19 2017 02/22/19 2355 02/23/19 0356 02/23/19 0757  GLUCAP 180* 74 118* 195* 210*   Wilber Oliphant, M.D.  PGY-2  Family Medicine  [phone] 02/23/2019 8:35 AM

## 2019-02-23 NOTE — Progress Notes (Signed)
Physical Therapy Wound Evaluation/Treatment Patient Details  Name: Maurice Stone MRN: [id number] Date of Birth: March 02, 1941  Today's Date: 02/23/2019 Time: 1135-1225 Time Calculation (min): 50 min  Subjective  Subjective: Pt alert on vent and agreeable to hydrotherapy by noddling head when PT arrived. Soon became lethargic after pain meds given.  Patient and Family Stated Goals: None stated Date of Onset: (Unsure)  Pain Score:  Pt premedicated and tolerated well with minimal indication of discomfort  Wound Assessment  Pressure Injury 02/23/19 Sacrum Mid Unstageable - Full thickness tissue loss in which the base of the ulcer is covered by slough (yellow, tan, gray, green or brown) and/or eschar (tan, brown or black) in the wound bed. (Active)  Wound Image   02/23/19 1458  Dressing Type ABD;Barrier Film (skin prep);Gauze (Comment);Moist to dry 02/23/19 1458  Dressing Changed 02/23/19 1458  Dressing Change Frequency Daily 02/23/19 1458  State of Healing Eschar 02/23/19 1458  Site / Wound Assessment Black;Pink;Yellow 02/23/19 1458  % Wound base Red or Granulating 10% 02/23/19 1458  % Wound base Yellow/Fibrinous Exudate 5% 02/23/19 1458  % Wound base Black/Eschar 85% 02/23/19 1458  Peri-wound Assessment Intact 02/23/19 1458  Wound Length (cm) 10 cm 02/23/19 1200  Wound Width (cm) 11.5 cm 02/23/19 1200  Wound Depth (cm) 0.1 cm 02/23/19 1200  Wound Surface Area (cm^2) 115 cm^2 02/23/19 1200  Wound Volume (cm^3) 11.5 cm^3 02/23/19 1200  Margins Unattached edges (unapproximated) 02/23/19 1458  Drainage Amount Moderate 02/23/19 1458  Drainage Description Odor;Serosanguineous 02/23/19 1458  Treatment Debridement (Selective);Hydrotherapy (Pulse lavage);Packing (Saline gauze) 02/23/19 1458   Santyl applied to wound bed prior to applying dressing.    Hydrotherapy Pulsed lavage therapy - wound location: Sacrum Pulsed Lavage with Suction (psi): 12 psi Pulsed Lavage with Suction - Normal  Saline Used: 1000 mL Pulsed Lavage Tip: Tip with splash shield Selective Debridement Selective Debridement - Location: Sacrum Selective Debridement - Tools Used: Forceps;Scalpel;Scissors Selective Debridement - Tissue Removed: Black eschar   Wound Assessment and Plan  Wound Therapy - Assess/Plan/Recommendations Wound Therapy - Clinical Statement: Pt presents to hydrotherapy s/p being found down at home for unknown amount of time. Pt is on the vent and appears pleasant/agreeable to hydrotherapy intervention. A large amount of eschar was able to be removed this session however this patient will continue to benefit from hydrotherapy for selective removal of remaining non-viable tissue, to decrease bioburden and promote wound bed healing.  Wound Therapy - Functional Problem List: Global weakness in the setting of critical illness.  Factors Delaying/Impairing Wound Healing: Diabetes Mellitus;Infection - systemic/local;Immobility;Multiple medical problems;Polypharmacy Hydrotherapy Plan: Debridement;Dressing change;Patient/family education;Pulsatile lavage with suction Wound Therapy - Frequency: 6X / week Wound Therapy - Follow Up Recommendations: Other (comment)(LTACH) Wound Plan: See above  Wound Therapy Goals- Improve the function of patient's integumentary system by progressing the wound(s) through the phases of wound healing (inflammation - proliferation - remodeling) by: Decrease Necrotic Tissue to: 20% Decrease Necrotic Tissue - Progress: Goal set today Increase Granulation Tissue to: 80% Increase Granulation Tissue - Progress: Goal set today Improve Drainage Characteristics: Min;Serous Improve Drainage Characteristics - Progress: Goal set today Goals/treatment plan/discharge plan were made with and agreed upon by patient/family: Yes Time For Goal Achievement: 7 days Wound Therapy - Potential for Goals: Good  Goals will be updated until maximal potential achieved or discharge criteria  met.  Discharge criteria: when goals achieved, discharge from hospital, MD decision/surgical intervention, no progress towards goals, refusal/missing three consecutive treatments without notification or medical reason.  GP  Thelma Comp 02/23/2019, 3:09 PM   [name], PT, DPT Acute Rehabilitation Services Pager: [phone] Office: [phone]

## 2019-02-23 NOTE — Progress Notes (Signed)
Spoke to [facility] over the phone about LTAC placement. Treatment decisions are on hold until they have had the family meeting scheduled tomorrow, 8/1, and 9AM. I will call palliative care to ensure they know and can discuss tomorrow.   Wilber Oliphant, M.D.  PGY-2  Family Medicine  [phone] 02/23/2019 2:13 PM

## 2019-02-24 DIAGNOSIS — Z515 Encounter for palliative care: Secondary | ICD-10-CM

## 2019-02-24 DIAGNOSIS — Z4659 Encounter for fitting and adjustment of other gastrointestinal appliance and device: Secondary | ICD-10-CM

## 2019-02-24 LAB — COMPREHENSIVE METABOLIC PANEL
ALT: 122 U/L — ABNORMAL HIGH (ref 0–44)
AST: 204 U/L — ABNORMAL HIGH (ref 15–41)
Albumin: 1.1 g/dL — ABNORMAL LOW (ref 3.5–5.0)
Alkaline Phosphatase: 417 U/L — ABNORMAL HIGH (ref 38–126)
Anion gap: 8 (ref 5–15)
BUN: 28 mg/dL — ABNORMAL HIGH (ref 8–23)
CO2: 24 mmol/L (ref 22–32)
Calcium: 7.6 mg/dL — ABNORMAL LOW (ref 8.9–10.3)
Chloride: 115 mmol/L — ABNORMAL HIGH (ref 98–111)
Creatinine, Ser: 1.23 mg/dL (ref 0.61–1.24)
GFR calc Af Amer: >60 mL/min (ref >60)
GFR calc non Af Amer: 57 mL/min — ABNORMAL LOW (ref >60)
Glucose, Bld: 166 mg/dL — ABNORMAL HIGH (ref 70–99)
Potassium: 3.6 mmol/L (ref 3.5–5.1)
Sodium: 147 mmol/L — ABNORMAL HIGH (ref 135–145)
Total Bilirubin: 1.1 mg/dL (ref 0.3–1.2)
Total Protein: 6.2 g/dL — ABNORMAL LOW (ref 6.5–8.1)

## 2019-02-24 LAB — GLUCOSE, CAPILLARY
Glucose-Capillary: 164 mg/dL — ABNORMAL HIGH (ref 70–99)
Glucose-Capillary: 165 mg/dL — ABNORMAL HIGH (ref 70–99)
Glucose-Capillary: 171 mg/dL — ABNORMAL HIGH (ref 70–99)
Glucose-Capillary: 182 mg/dL — ABNORMAL HIGH (ref 70–99)
Glucose-Capillary: 182 mg/dL — ABNORMAL HIGH (ref 70–99)
Glucose-Capillary: 199 mg/dL — ABNORMAL HIGH (ref 70–99)
Glucose-Capillary: 207 mg/dL — ABNORMAL HIGH (ref 70–99)

## 2019-02-24 MED ORDER — FERROUS SULFATE 300 (60 FE) MG/5ML PO SYRP
300.0000 mg | ORAL_SOLUTION | Freq: Two times a day (BID) | ORAL | Status: DC
  Administered 2019-02-24 – 2019-03-02 (×13): 300 mg via ORAL
  Filled 2019-02-24 (×14): qty 5

## 2019-02-24 MED ORDER — POTASSIUM CHLORIDE 20 MEQ/15ML (10%) PO SOLN
20.0000 meq | ORAL | Status: AC
  Administered 2019-02-24 (×2): 20 meq
  Filled 2019-02-24 (×2): qty 15

## 2019-02-24 NOTE — Progress Notes (Signed)
Daily Progress Note   Patient Name: Maurice Stone       Date: 02/24/2019 DOB: July 18, 1941  Age: 78 y.o. MRN#: [id number] Attending Physician: Collene Gobble, MD Primary Care Physician: [facility] Admit Date: 02/10/2019  Reason for Consultation/Follow-up: Establishing goals of care and Psychosocial/spiritual support  Subjective: Discussed with CCM MD.  Then met with family at bedside and privately in a conference room.  Had an extended discussion about his values - He is independent, needs to make his own decisions (even if they are bad ones). Did not like being in facilities.  Developed paranoia about people caring for him.  Has a strong leaning towards a need for independence.  We then discussed his medical issues:  (1) underlying brittle diabetes which lead to recurrent infections, wounds, stroke, DKA and weakened overall foundation (2) severe malnutrition (which again causes a very weak foundation and ability to heal); (3) swallowing issues with aspiration pneumonia; (4) mild to mod dementia - which is likely to have worsened significantly with this illness.  (5) last we discussed the likely multiple myeloma - it is metastatic stage 4, treatment would only be palliative not curative, and it is difficult to go thru.  After this discussion I asked the family to consider extubation without re-intubation.  I explained that if he is successful we continue on to the next step and determine whether or not he can eat.   However if we re-intubate we are likely to be  committing him to long term trach/PEG/vent support at a facility.    The family asked for time to discuss this privately - which I gave them.  They decided to be prepared to re-intubate and trach / PEG if necessary.  Their  reasoning is that he has improved significantly since admission and they are hopeful he will continue to improve.  They feel he has a very strong spirit to fight.  They asked to stay for extubation.  I discussed this with RN and director and gained permission for 1 family member to stay with him (it has to be the same family member consistently).   This was explained to the family and they understand.  Assessment: 78 yo male with brittle DM, bacteremia, wounds, dementia, aspiration pneumonia.  Alert, responsive, nearing extubation.   Patient Profile/HPI:  78  y.o. male  with past medical history of coronary artery disease, CVA 2002, 2015; diabetes type 2, GERD, hypertension, COPD, dementia, admitted on 02/15/2019 after being found down (potentially for 3 days), with fever, hypotension, tachycardia.  He was diagnosed with E. coli bacteremia as well as DKA.  He was started on antibiotics and insulin drip.  He was initially admitted to the ICU on 02/09/2019 and transferred out of ICU on 02/13/2019.  On 02/16/2019 he had increased oxygen demands, questionable aspiration, and was transferred back to ICU where he has now been intubated.  He is currently weaning. CT of the abdomen performed on 02/16/2019 revealed new onset of either multiple myeloma or metastatic bony disease from unknown primary    Length of Stay: 13  Current Medications: Scheduled Meds:   ascorbic acid  250 mg Per Tube BID   atorvastatin  40 mg Per Tube QHS   chlorhexidine gluconate (MEDLINE KIT)  15 mL Mouth Rinse BID   Chlorhexidine Gluconate Cloth  6 each Topical Q0600   clopidogrel  75 mg Per Tube Daily   collagenase   Topical Daily   ferrous sulfate  300 mg Oral BID WC   free water  300 mL Per Tube Q4H   heparin injection (subcutaneous)  5,000 Units Subcutaneous Q8H   insulin aspart  0-15 Units Subcutaneous Q4H   insulin aspart  5 Units Subcutaneous Q4H   insulin detemir  25 Units Subcutaneous Daily   mouth rinse   15 mL Mouth Rinse 10 times per day   pantoprazole sodium  40 mg Per Tube Q24H   sertraline  100 mg Per Tube QHS   sodium chloride flush  10-40 mL Intracatheter Q12H   sodium chloride flush  3 mL Intravenous Q12H    Continuous Infusions:  sodium chloride Stopped (02/24/19 0931)   ceFEPime (MAXIPIME) IV 200 mL/hr at 02/24/19 1000   dexmedetomidine (PRECEDEX) IV infusion 0.4 mcg/kg/hr (02/24/19 1000)   feeding supplement (VITAL AF 1.2 CAL) 1,000 mL (02/23/19 2017)    PRN Meds: sodium chloride, acetaminophen, fentaNYL (SUBLIMAZE) injection, ipratropium-albuterol, polyethylene glycol, sodium chloride flush  Physical Exam        Well developed male, intubated, lethargic but awake and attempting to follow commands.  Vital Signs: BP (!) 114/57    Pulse 65    Temp 98.8 F (37.1 C) (Oral)    Resp (!) 28    Ht '5\' 11"'  (1.803 m)    Wt 87 kg    SpO2 97%    BMI 26.75 kg/m  SpO2: SpO2: 97 % O2 Device: O2 Device: Ventilator O2 Flow Rate: O2 Flow Rate (L/min): 15 L/min  Intake/output summary:   Intake/Output Summary (Last 24 hours) at 02/24/2019 1110 Last data filed at 02/24/2019 1000 Gross per 24 hour  Intake 1908.74 ml  Output 2050 ml  Net -141.26 ml   LBM: Last BM Date: 02/21/19 Baseline Weight: Weight: 78.8 kg Most recent weight: Weight: 87 kg       Palliative Assessment/Data:  10%    Patient Active Problem List   Diagnosis Date Noted   Respiratory failure ([place])    Ventilator dependent (HCC)    Multiple myeloma not having achieved remission (HCC)    Endotracheally intubated    Pressure injury of skin 02/17/2019   Goals of care, counseling/discussion    Palliative care by specialist    Protein-calorie malnutrition, severe 02/14/2019   Altered mental status    DKA (diabetic ketoacidosis) ([place]) 02/22/2019   E coli bacteremia  05/02/2017   Lower urinary tract infectious disease    Sepsis (HCC)    Insulin dependent type 2 diabetes mellitus, uncontrolled ([place])     Hemorrhoids    Diabetic mononeuropathy associated with type 2 diabetes mellitus ([place])    Hyperglycemia 05/01/2017   Hyperlipidemia    History of CVA with residual deficit    TIA (transient ischemic attack) 02/02/2015   Numbness 03/29/2014   Stroke ([place]) 03/28/2014   Diabetes mellitus ([place]) 03/28/2014   CVA (cerebral infarction) 01/18/2014   Acute CVA (cerebrovascular accident) ([place]) 01/17/2014   CVA (cerebral vascular accident) ([place]) 01/15/2014   Essential hypertension 01/15/2014   Bradycardia 01/15/2014   chronic Right sided weakness 01/15/2014   GERD (gastroesophageal reflux disease) 01/15/2014   Neuropathy 01/15/2014    Palliative Care Plan    Recommendations/Plan:  Family aware of fragility of patient but still hopeful for improvement.  Despite his strong need for independence family is choosing LTAC / SNF for him for the foreseeable future.  They are very aware that he could decline and we would need to re-assess.  OK with re-intubation  Ok with Tracheostomy  Ok with PEG  Goals of Care and Additional Recommendations:  Limitations on Scope of Treatment: Full Scope Treatment  Code Status:  Full code  Prognosis:   Unable to determine he is a high risk for acute respiratory decline and death - pending whether or not he will be able to manage his own secretions.   Discharge Planning:  To Be Determined  Care plan was discussed with CCM MD, CCM RN, Family.  Thank you for allowing the Palliative Medicine Team to assist in the care of this patient.  Total time spent:  90 min.     Greater than 50%  of this time was spent counseling and coordinating care related to the above assessment and plan.  Florentina Jenny, PA-C Palliative Medicine  Please contact Palliative MedicineTeam phone at [phone] for questions and concerns between 7 am - 7 pm.   Please see AMION for individual provider pager numbers.

## 2019-02-24 NOTE — Progress Notes (Signed)
NAME:  Maurice Stone, MRN:  [id number], DOB:  Jul 29, 1940, LOS: 7 ADMISSION DATE:  02/08/2019, CONSULTATION DATE:  02/12/2019 REFERRING MD:  Dr. Daryll Drown, IMTS CHIEF COMPLAINT: altered mental status  Brief History   78 yo male found at home with altered mental status (last seen 3 days prior to admission).  Found to have fever, hypotension, hyperglycemia, and anion gap acidosis from DKA, rhabdomyolysis, AKI and sepsis with E coli bacteremia.  Past Medical History   Jehovah's Witness, COPD, CAD, Dementia, GERD, HTN, CVA, DM, OA  Significant Hospital Events   7/19 admit 7/21 transferred out of ICU 7/24 increasing O2 requirements, probable aspiration, transferred back to ICU 7/26 Extubate  Consults:  Pharmacy, nutrition, SLP, palliative, oncology  Procedures:  ETT 7/24 > 7/26 ETT 7/26 >   Significant Diagnostic Tests:  CT head 7/19 > No acute intracranial abnormality. Stable moderate generalized atrophy and mild chronic microvascular ischemic changes of the white matter. Remote lacunar strokes in the left basal ganglia. CT head 7/24 > negative. CT abd/pelvis 7/24 > multiple small lucencies throughout visualized spine and pelvis concerning for multiple myeloma or metastatic disease.  Large lucencies in both femurs.  Possible right pyelonephritis.  RML and RML opacities concerning for PNA. UPEP 7/25 >>  positive M spike Multiple myeloma panel>>  Kappa/lambda light chains>>  Micro Data:  COVD 7/19 > Negative BC 7/19 > E. Coli sensitivities pending >>  Urine 7/19 >>> E.coli, resistant to ampicillin and Augmentin FINAL COVID 7/24 >> no growth, FINAL Sputum 7/24 >> rare yeast, consistent with respiratory flora, FINAL Blood 7/24 >> NG FINAL  Antimicrobials:  Vancomycin 7/19 & 7/24 Cefepime 7/19 >> 7/21 & 7/24 >>7/30 Rocephin 7/20 >> 7/22 & 7/24 Flagyl 7/20  Interim history/subjective:  Tolerating pressure support this morning Awake and interacts, nods to questions,  attempt to follow commands on decreased dose Precedex  Objective   Temp:  [98 F (36.7 C)-98.9 F (37.2 C)] 98.8 F (37.1 C) (08/01 0700) Pulse Rate:  [56-73] 65 (08/01 1000) Resp:  [0-31] 28 (08/01 1000) BP: (86-121)/(40-66) 114/57 (08/01 1000) SpO2:  [93 %-100 %] 97 % (08/01 1000) FiO2 (%):  [40 %] 40 % (08/01 0840) Weight:  [87 kg] 87 kg (08/01 0436)      Intake/Output Summary (Last 24 hours) at 02/24/2019 1053 Last data filed at 02/24/2019 1000 Gross per 24 hour  Intake 1930.85 ml  Output 2550 ml  Net -619.15 ml   Vent Mode: (P) PRVC FiO2 (%):  [40 %] 40 % Set Rate:  [16 bmp] 16 bmp Vt Set:  [600 mL] 600 mL PEEP:  [5 cmH20] 5 cmH20 Pressure Support:  [5 cmH20-10 cmH20] 5 cmH20 Plateau Pressure:  [20 cmH20] 20 cmH20  Examination: General: Ill-appearing man, intubated, no distress HENT: ET tube in place, good position Lungs: Clear bilaterally, no crackles, no wheezes Cardiovascular: Regular, distant, no murmur Abdomen: Soft, nondistended, positive bowel sounds Extremities: No significant edema Neuro: Awake, nods to questions, follows commands, normal strength on the left, weaker on the right (chronic) GU: Foley catheter in place   Resolved Problems:  DKA, Metabolic Acidosis, AKI, Rhabdomyolysis  Assessment & Plan:  Acute hypoxic respiratory failure from aspiration pneumonia. Hx of COPD. -Tolerating pressure support again today.  His RSBI is 75-85.  Based on discussions with palliative care and the patient's family they would want him reintubated if he were to fail, would contemplate tracheostomy for longer support he give him a chance for recovery from this.  I explained  that his respiratory failure, barriers to long-term recovery include his underlying malignancy.  They understand.  I think that we should try extubation today and see if he tolerates.  If not we will reintubate and revisit longer-term support, tracheostomy with family. -Bronchodilators as  ordered   Bacteremia (7/19 Aspiration pneumonia/ventilator associated pneumonia E coli bacteremia with possible Rt pyelonephritis. -Completed 7 days of cefepime -Following clinically, obtain culture data if fever or evidence of decompensation   Concern for Multiple Myeloma  -Oncology has consulted.  Discussed possible bone marrow biopsy, on hold for now pending further discussions with family.  Suspect decision will be influenced by whether he can be liberated from mechanical ventilation -Jehovah's Witness but not actively practicing, would accept blood products under extreme circumstances  Acute metabolic encephalopathy. Hx of CVA, dementia. Baseline function per daughter is independent.  -Plavix, Zoloft as ordered -Aricept on hold, restart soon  Hx of HTN, HLD. Normotensive  -Continue Lipitor -Hold Norvasc, Cozaar, Lopressor  DM with neuropathy. Resolved DKA. CBGs in 300s. 20 levemir.  Increase Levemir to 25 units daily from 20 yesterday.  Decrease NovoLog to 5 units every 4 hours plus sliding scale every 4 hours. -Continue Levemir 25 units -Scheduled NovoLog per tube feed coverage -Hold Neurontin, metformin  Hypernatremia. Hypokalemia. Hypophosphatemia -Continue free water as ordered Follow BMP  Moderate protein calorie malnutrition. -Repeat swallowing evaluation, SLP evaluation once extubated -Hold tube feeding in preparation for possible extubation today  Goals of care. -Greatly appreciate palliative care input today.  All issues discussed with family and they are hopeful that he can recover from the acute process and then be evaluated for possible myeloma.  They would be in favor of reintubation, tracheostomy if he fails extubation in the coming days.  Anemia, thrombocytopenia of critical illness. Hemoglobin 7.4 with admission Hgb 12.2.  -Try to minimize blood draws as able -MVI, iron   Best practice:  Diet: NPO, tube feeds DVT prophylaxis:  protonix GI prophylaxis: SQ heparin Mobility: Bedrest. Code Status: Full Disposition: ICU Family: I discussed status and plans with pt at bedside 8/1  Labs:   CBC: Recent Labs  Lab 02/19/19 0220 02/20/19 0234 02/21/19 0406 02/22/19 0221 02/23/19 0227  WBC 16.2* 14.8* 15.6* 11.2* 10.2  NEUTROABS  --   --  12.9*  --   --   HGB 7.9* 8.2* 8.0* 7.4* 7.3*  HCT 25.1* 26.6* 25.9* 24.5* 23.8*  MCV 90.9 91.1 92.5 92.1 91.2  PLT 185 229 244 261 256   CMP: Recent Labs  Lab 02/17/19 1649  02/18/19 0350  02/19/19 0220 02/20/19 0234 02/21/19 0406 02/22/19 0221 02/23/19 0227 02/24/19 0207  NA  --   --  149*   < > 151* 154* 151* 151* 149* 147*  K  --   --  3.6   < > 3.3* 3.7 3.5 3.6 3.8 3.6  CL  --    < > 115*  --  117* 120* 120* 119* 117* 115*  CO2  --    < > 24  --  '24 24 24 23 24 24  ' GLUCOSE  --    < > 297*  --  351* 273* 228* 182* 212* 166*  BUN  --    < > 25*  --  25* 24* 29* 32* 32* 28*  CREATININE  --    < > 1.41*  --  1.53* 1.44* 1.50* 1.17 1.21 1.23  CALCIUM  --    < > 7.6*  --  7.7* 8.0* 7.9*  7.7* 7.7* 7.6*  MG 2.1  --  2.1  --  2.1  --   --  2.0 2.0  --   PHOS 2.1*  --  1.9*   1.9*  --  1.8*  --   --  2.4*  --   --   ALBUMIN  --   --  1.3*  --   --   --   --   --   --  1.1*   < > = values in this interval not displayed.   GFR: Estimated Creatinine Clearance: 54.4 mL/min (by C-G formula based on SCr of 1.23 mg/dL).  Liver Function Tests: Recent Labs  Lab 02/18/19 0350 02/24/19 0207  AST  --  204*  ALT  --  122*  ALKPHOS  --  417*  BILITOT  --  1.1  PROT  --  6.2*  ALBUMIN 1.3* 1.1*   CBG: Recent Labs  Lab 02/23/19 1524 02/23/19 2004 02/24/19 0004 02/24/19 0425 02/24/19 0742  GLUCAP 226* 180* 182* 165* 199*    Independent CC time 40 minutes  Baltazar Apo, MD, PhD 02/24/2019, 11:19 AM [facility] [phone] or if no answer [phone]

## 2019-02-24 NOTE — Progress Notes (Signed)
[facility] ADULT ICU REPLACEMENT PROTOCOL FOR AM LAB REPLACEMENT ONLY  The patient does apply for the [facility] Adult ICU Electrolyte Replacment Protocol based on the criteria listed below:   1. Is GFR >/= 40 ml/min? Yes.    Patient's GFR today is >60 2. Is urine output >/= 0.5 ml/kg/hr for the last 6 hours? Yes.   Patient's UOP is 1.1 ml/kg/hr 3. Is BUN < 60 mg/dL? Yes.    Patient's BUN today is 28 4. Abnormal electrolyte(s): K-3.6 5. Ordered repletion with: per protcocol6. If a panic level lab has been reported, has the CCM MD in charge been notified? Yes.  .   Physician:  Dr. Terrill Mohr, Philis Nettle 02/24/2019 6:14 AM

## 2019-02-24 NOTE — Procedures (Signed)
Extubation Procedure Note  Patient Details:   Name: Maurice Stone DOB: 05/31/1941 MRN: [id number]   Airway Documentation:    Vent end date: 02/24/19 Vent end time: 1130   Evaluation  O2 sats: stable throughout Complications: No apparent complications Patient did tolerate procedure well. Bilateral Breath Sounds: Clear, Diminished, Other (Comment)(coarse)   Yes   Positive cuff leak noted. Patient placed on [place] 4 L with humidity, no stridor noted.  Patient has a congested cough, secretions cleared with [place].  RT instructed patient and family on incentive spirometer.  Patient attempted to use the IS but only reach 329mL.  In addition to help from RT and RN, the patient's son will assist with coaching him to use it.  [name] 02/24/2019, 11:50 AM
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
## 2019-02-24 DEATH — deceased

## 2019-02-25 LAB — BASIC METABOLIC PANEL
Anion gap: 9 (ref 5–15)
BUN: 28 mg/dL — ABNORMAL HIGH (ref 8–23)
CO2: 23 mmol/L (ref 22–32)
Calcium: 7.9 mg/dL — ABNORMAL LOW (ref 8.9–10.3)
Chloride: 117 mmol/L — ABNORMAL HIGH (ref 98–111)
Creatinine, Ser: 1.34 mg/dL — ABNORMAL HIGH (ref 0.61–1.24)
GFR calc Af Amer: 59 mL/min — ABNORMAL LOW (ref >60)
GFR calc non Af Amer: 51 mL/min — ABNORMAL LOW (ref >60)
Glucose, Bld: 201 mg/dL — ABNORMAL HIGH (ref 70–99)
Potassium: 3.7 mmol/L (ref 3.5–5.1)
Sodium: 149 mmol/L — ABNORMAL HIGH (ref 135–145)

## 2019-02-25 LAB — CBC
HCT: 23.1 % — ABNORMAL LOW (ref 39.0–52.0)
Hemoglobin: 7.2 g/dL — ABNORMAL LOW (ref 13.0–17.0)
MCH: 28.2 pg (ref 26.0–34.0)
MCHC: 31.2 g/dL (ref 30.0–36.0)
MCV: 90.6 fL (ref 80.0–100.0)
Platelets: 291 10*3/uL (ref 150–400)
RBC: 2.55 MIL/uL — ABNORMAL LOW (ref 4.22–5.81)
RDW: 16.9 % — ABNORMAL HIGH (ref 11.5–15.5)
WBC: 12 10*3/uL — ABNORMAL HIGH (ref 4.0–10.5)
nRBC: 0.9 % — ABNORMAL HIGH (ref 0.0–0.2)

## 2019-02-25 LAB — GLUCOSE, CAPILLARY
Glucose-Capillary: 193 mg/dL — ABNORMAL HIGH (ref 70–99)
Glucose-Capillary: 194 mg/dL — ABNORMAL HIGH (ref 70–99)
Glucose-Capillary: 216 mg/dL — ABNORMAL HIGH (ref 70–99)
Glucose-Capillary: 228 mg/dL — ABNORMAL HIGH (ref 70–99)
Glucose-Capillary: 229 mg/dL — ABNORMAL HIGH (ref 70–99)
Glucose-Capillary: 241 mg/dL — ABNORMAL HIGH (ref 70–99)

## 2019-02-25 MED ORDER — DONEPEZIL HCL 10 MG PO TABS
10.0000 mg | ORAL_TABLET | Freq: Every day | ORAL | Status: DC
  Administered 2019-02-25 – 2019-03-01 (×5): 10 mg via ORAL
  Filled 2019-02-25 (×5): qty 1

## 2019-02-25 MED ORDER — ORAL CARE MOUTH RINSE
15.0000 mL | Freq: Two times a day (BID) | OROMUCOSAL | Status: DC
  Administered 2019-02-25 (×2): 15 mL via OROMUCOSAL

## 2019-02-25 MED ORDER — CHLORHEXIDINE GLUCONATE 0.12 % MT SOLN
15.0000 mL | Freq: Two times a day (BID) | OROMUCOSAL | Status: DC
  Administered 2019-02-25 – 2019-02-27 (×5): 15 mL via OROMUCOSAL
  Filled 2019-02-25 (×3): qty 15
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
## 2019-02-25 NOTE — Evaluation (Signed)
Physical Therapy Evaluation Patient Details Name: Maurice Stone MRN: [id number] DOB: 25-Oct-1940 Today's Date: 02/25/2019   History of Present Illness  78 year old male Jehovah's Witness with a history of stroke in 2002 and 2015 with R sided deficits, CAD, COPD, dementia, HTN, T2DM and GERD who presented to the ED after being found down at his residence for an unknown amount of time. Admitted 01/24/2019 for treatment of sepsis , DKA, and rhabdomyolysis.  Intubated on 7/24 due to increased oxygen demand, extubated 02/24/19  Clinical Impression  Difficult to attain information due to difficulties with expressive communication. Able to answer yes/no questions 50% of time. Pt living alone prior to hospitalization and was walking with RW. Pt is limited in safe mobility by decreased strength and ROM in presence of decreased cognition. Pt total A to recliner with lift, however in recliner pt able to follow commands for ankle pumps and attempts to right himself in chair. PT recommending SNF level rehab at discharge. PT will continue to follow acutely.    Follow Up Recommendations SNF    Equipment Recommendations  None recommended by PT    Recommendations for Other Services OT consult     Precautions / Restrictions Precautions Precautions: Fall Precaution Comments: NG tube, flexiseal Restrictions Weight Bearing Restrictions: No      Mobility  Bed Mobility Overal bed mobility: Needs Assistance Bed Mobility: Rolling Rolling: Total assist         General bed mobility comments: pt requires total A for rolling for placement of lift pad        Balance Overall balance assessment: Needs assistance Sitting-balance support: Bilateral upper extremity supported Sitting balance-Leahy Scale: Poor Sitting balance - Comments: pt unable to maintain upright in recliner due to pronounced L lateral lean requiring pillows to bolster him up Postural control: Left lateral lean                                    Pertinent Vitals/Pain Pain Assessment: Faces Faces Pain Scale: Hurts a little bit Pain Location: generalized with movement Pain Descriptors / Indicators: Grimacing Pain Intervention(s): Limited activity within patient's tolerance;Monitored during session;Repositioned    Home Living Family/patient expects to be discharged to:: Skilled nursing facility                      Prior Function Level of Independence: Independent with assistive device(s)                  Extremity/Trunk Assessment   Upper Extremity Assessment RUE Deficits / Details: strength grossly 1/5, grimace with ROM  RUE: Unable to fully assess due to pain RUE Coordination: decreased fine motor;decreased gross motor LUE Deficits / Details: strength in elbow and wrist grossly 3-/5, grimace with AAROM    Lower Extremity Assessment Lower Extremity Assessment: RLE deficits/detail;LLE deficits/detail RLE Deficits / Details: strength grossly 1/5, grimace with AAROM  RLE: Unable to fully assess due to pain RLE Sensation: decreased light touch RLE Coordination: decreased fine motor;decreased gross motor LLE Deficits / Details: strength grossly 2/5, AAROM limited at end range, pt able to perform ankle pumps independently LLE Sensation: decreased light touch LLE Coordination: decreased fine motor;decreased gross motor       Communication   Communication: Expressive difficulties(unable to speak, answers yes/no questions with nods)  Cognition Arousal/Alertness: Awake/alert Behavior During Therapy: WFL for tasks assessed/performed Overall Cognitive Status: Difficult to assess Area of Impairment:  Following commands                               General Comments: Pt able to follow command to perform ankle pumps, and try to do them as often as possible, pt noted to be doing ankle pumps throughout session       General Comments General comments (skin integrity, edema, etc.):  VSS         Assessment/Plan    PT Assessment Patient needs continued PT services  PT Problem List Decreased strength;Decreased range of motion;Decreased activity tolerance;Decreased balance;Decreased mobility;Decreased coordination;Decreased cognition;Decreased safety awareness;Decreased knowledge of precautions;Cardiopulmonary status limiting activity;Pain;Decreased skin integrity       PT Treatment Interventions DME instruction;Gait training;Functional mobility training;Therapeutic activities;Therapeutic exercise;Balance training;Cognitive remediation    PT Goals (Current goals can be found in the Care Plan section)  Acute Rehab PT Goals Patient Stated Goal: unable to state PT Goal Formulation: With patient Time For Goal Achievement: 03/11/19 Potential to Achieve Goals: Fair    Frequency Min 2X/week    AM-PAC PT "6 Clicks" Mobility  Outcome Measure Help needed turning from your back to your side while in a flat bed without using bedrails?: Total Help needed moving from lying on your back to sitting on the side of a flat bed without using bedrails?: Total Help needed moving to and from a bed to a chair (including a wheelchair)?: Total Help needed standing up from a chair using your arms (e.g., wheelchair or bedside chair)?: Total Help needed to walk in hospital room?: Total Help needed climbing 3-5 steps with a railing? : Total 6 Click Score: 6    End of Session Equipment Utilized During Treatment: Gait belt;Oxygen Activity Tolerance: Patient tolerated treatment well Patient left: in chair;with call bell/phone within reach Nurse Communication: Mobility status;Other (comment)(limited time in chair due to sacral wound) PT Visit Diagnosis: Other abnormalities of gait and mobility (R26.89);Muscle weakness (generalized) (M62.81);Difficulty in walking, not elsewhere classified (R26.2);History of falling (Z91.81);Other symptoms and signs involving the nervous system  (R29.898);Hemiplegia and hemiparesis;Pain Hemiplegia - Right/Left: Right Hemiplegia - dominant/non-dominant: Dominant Hemiplegia - caused by: Unspecified Pain - Right/Left: Right Pain - part of body: (ROM of all joints)    Time: [id number] PT Time Calculation (min) (ACUTE ONLY): 35 min   Charges:   PT Evaluation $PT Eval Moderate Complexity: 1 Mod PT Treatments $Therapeutic Activity: 8-22 mins        Alena Blankenbeckler B. Migdalia Dk PT, DPT Acute Rehabilitation Services Pager [phone] Office [phone]   [place] 02/25/2019, 1:00 PM

## 2019-02-25 NOTE — Progress Notes (Signed)
CPT not done at this time. The patient is up in the chair.  Patient was suctioned orally.

## 2019-02-25 NOTE — Progress Notes (Signed)
NAME:  Maurice Stone, MRN:  [id number], DOB:  June 11, 1941, LOS: 7 ADMISSION DATE:  01/28/2019, CONSULTATION DATE:  02/12/2019 REFERRING MD:  Dr. Daryll Drown, IMTS CHIEF COMPLAINT: altered mental status  Brief History   89 /male found at home with altered mental status (last seen 3 days prior to admission).  Found to have fever, hypotension, hyperglycemia, and anion gap acidosis from DKA, rhabdomyolysis, AKI and sepsis with E coli bacteremia.  Past Medical History  Jehovah's Witness, COPD, CAD, Dementia, GERD, HTN, CVA, DM, [place] [facility] Events   7/19 admit 7/21 transferred out of ICU 7/24 increasing O2 requirements, probable aspiration, transferred back to ICU 7/26 Extubated, reintubated 8/01 Extubated   Consults:  Pharmacy, nutrition, SLP, palliative, oncology  Procedures:  ETT 7/24 > 7/26 ETT 7/26 > 8/1  Significant Diagnostic Tests:  CT head 7/19 > No acute intracranial abnormality. Stable moderate generalized atrophy and mild chronic microvascular ischemic changes of the white matter. Remote lacunar strokes in the left basal ganglia. CT head 7/24 > negative. CT abd/pelvis 7/24 > multiple small lucencies throughout visualized spine and pelvis concerning for multiple myeloma or metastatic disease.  Large lucencies in both femurs.  Possible right pyelonephritis.  RML and RML opacities concerning for PNA. UPEP 7/25 >>  positive M spike Multiple myeloma panel >>  Kappa/lambda light chains 7/29 >> kappa 605, lamda 124, kappa, lamda ratio 4.8  Micro Data:  COVD 7/19 > Negative BC 7/19 > E. Coli sensitivities pending >>  Urine 7/19 >>> E.coli, resistant to ampicillin and Augmentin   COVID 7/24 >> negative  Sputum 7/24 >> rare yeast, consistent with respiratory flora Blood 7/24 >> Negative  Antimicrobials:  Vancomycin 7/19 & 7/24 Cefepime 7/19 >> 7/21 & 7/24 >>7/30 Rocephin 7/20 >> 7/22 & 7/24 Flagyl 7/20 >>  Interim history/subjective:  Tmax 100.4 in last  24h.  I/O- 3L UOP in 24/h, 6L positive for 24/h.  RN reports patient has weak cough.  Reports PT planning to mobilize pt to chair today.   Objective   Temp:  [98 F (36.7 C)-100.4 F (38 C)] 98 F (36.7 C) (08/02 0756) Pulse Rate:  [64-116] 112 (08/02 0800) Resp:  [22-36] 35 (08/02 0800) BP: (108-150)/(54-115) 142/78 (08/02 0800) SpO2:  [92 %-99 %] 92 % (08/02 0800) Weight:  [87.2 kg] 87.2 kg (08/02 0500)      Intake/Output Summary (Last 24 hours) at 02/25/2019 0939 Last data filed at 02/25/2019 0800 Gross per 24 hour  Intake 9174.21 ml  Output 3255 ml  Net 5919.21 ml      Examination: General: elderly male sitting up in bed in NAD HEENT: MM pink/moist, poor dentition, weak cough, upper airway secretions  Neuro: Awake, alert, attempts to communicate appropriately, generalized weakness CV: s1s2 rrr, ST on monitor, no m/r/g PULM:  Non-labored, upper airway secretions noted, rhonchi bilatearlly  GI: soft, bsx4 active  Extremities: warm/dry, trace BLE edema  Skin: no rashes or lesions   Resolved Problems:  DKA, Metabolic Acidosis, AKI, Rhabdomyolysis  Assessment & Plan:   Acute Hypoxic Respiratory Failure from aspiration pneumonia. Hx of COPD P: Pulmonary hygiene - IS, mobilize  Add chest PT per bed NTS PRN  Family would want reintubation if fails, would need to revisit concept of tracheostomy Duoneb Q4  Follow intermittent CXR  Wean O2 as needed for sats >90%  E-Coli Bacteremia (7/19) Aspiration pneumonia/ventilator associated pneumonia Possible R pyelonephritis. -Completed 7 days of cefepime P: Monitor clinically  If new fever / rise in WBC,  repeat cultures  Concern for Multiple Myeloma  P: Light chains as above  Oncology following, appreciate input.  Have contemplated bone marrow biopsy but holding for now.  Jehovah's Witness but not actively practicing, would accept blood products under extreme circumstances  Acute metabolic encephalopathy. Hx of  CVA, dementia. -baseline function per daughter is independent P: PT consult  Continue Plavix, Zoloft  Resume home Aricept   Hx of HTN, HLD P: Continue lipitor, norvasc, cozaar, lopressor  ICU monitoring   DM with Neuropathy -resolved DKA.  P: Continue SSI, moderate scale Levemir 25 units QD Novolog 5 units Q4 for "meal" coverage   Hypernatremia. Hypokalemia. Hypophosphatemia P: Continue free water 300 ml PT Q4  Follow lytes on BMP, replace as indicated   Anemia Thrombocytopenia of critical illness. -hemoglobin 7.4 with admission Hgb 12.2.  P: Minimize phlebotomy as able  MVI, iron per tube    Moderate protein calorie malnutrition P: SLP evaluation appreciated  Continue TF   Goals of care. -Greatly appreciate palliative care input.  All issues discussed with family and they are hopeful that he can recover from the acute process and then be evaluated for possible myeloma.  They would be in favor of reintubation, tracheostomy if he fails extubation in the coming days.  Best practice:  Diet: NPO, tube feeds DVT prophylaxis: protonix GI prophylaxis: SQ heparin Mobility: Bedrest. Code Status: Full Code  Disposition: ICU Family: Patient updated on plan of care 8/2.    Labs:   CBC: Recent Labs  Lab 02/20/19 0234 02/21/19 0406 02/22/19 0221 02/23/19 0227 02/25/19 0009  WBC 14.8* 15.6* 11.2* 10.2 12.0*  NEUTROABS  --  12.9*  --   --   --   HGB 8.2* 8.0* 7.4* 7.3* 7.2*  HCT 26.6* 25.9* 24.5* 23.8* 23.1*  MCV 91.1 92.5 92.1 91.2 90.6  PLT 229 244 261 256 291   CMP: Recent Labs  Lab 02/19/19 0220  02/21/19 0406 02/22/19 0221 02/23/19 0227 02/24/19 0207 02/25/19 0009  NA 151*   < > 151* 151* 149* 147* 149*  K 3.3*   < > 3.5 3.6 3.8 3.6 3.7  CL 117*   < > 120* 119* 117* 115* 117*  CO2 24   < > _0 GLUCOSE 351*   < > 228* 182* 212* 166* 201*  BUN 25*   < > 29* 32* 32* 28* 28*  CREATININE 1.53*   < > 1.50* 1.17 1.21 1.23 1.34*   CALCIUM 7.7*   < > 7.9* 7.7* 7.7* 7.6* 7.9*  MG 2.1  --   --  2.0 2.0  --   --   PHOS 1.8*  --   --  2.4*  --   --   --   ALBUMIN  --   --   --   --   --  1.1*  --    < > = values in this interval not displayed.   GFR: Estimated Creatinine Clearance: 50 mL/min (A) (by C-G formula based on SCr of 1.34 mg/dL (H)).  Liver Function Tests: Recent Labs  Lab 02/24/19 0207  AST 204*  ALT 122*  ALKPHOS 417*  BILITOT 1.1  PROT 6.2*  ALBUMIN 1.1*   CBG: Recent Labs  Lab 02/24/19 1609 02/24/19 2001 02/24/19 2325 02/25/19 0300 02/25/19 0758  GLUCAP 164* 182* 171* 194* 228*    Noe Gens, NP-C [facility] Pgr: [phone] or if no answer [phone] 02/25/2019, 10:15 AM

## 2019-02-25 NOTE — Progress Notes (Signed)
Pt's SpO2 upon RT arrival was 88% on 5L [place]. Pt changed to [place] HFNC on 5L, SpO2 currently 92%.

## 2019-02-26 ENCOUNTER — Inpatient Hospital Stay (HOSPITAL_COMMUNITY): Payer: Medicare Other

## 2019-02-26 DIAGNOSIS — Z7189 Other specified counseling: Secondary | ICD-10-CM

## 2019-02-26 DIAGNOSIS — J9601 Acute respiratory failure with hypoxia: Secondary | ICD-10-CM

## 2019-02-26 DIAGNOSIS — Z515 Encounter for palliative care: Secondary | ICD-10-CM

## 2019-02-26 DIAGNOSIS — J810 Acute pulmonary edema: Secondary | ICD-10-CM

## 2019-02-26 DIAGNOSIS — Z66 Do not resuscitate: Secondary | ICD-10-CM

## 2019-02-26 DIAGNOSIS — Z9989 Dependence on other enabling machines and devices: Secondary | ICD-10-CM

## 2019-02-26 LAB — POCT I-STAT 7, (LYTES, BLD GAS, ICA,H+H)
Acid-Base Excess: 2 mmol/L (ref 0.0–2.0)
Acid-Base Excess: 3 mmol/L — ABNORMAL HIGH (ref 0.0–2.0)
Bicarbonate: 26 mmol/L (ref 20.0–28.0)
Bicarbonate: 27 mmol/L (ref 20.0–28.0)
Calcium, Ion: 1.23 mmol/L (ref 1.15–1.40)
Calcium, Ion: 1.24 mmol/L (ref 1.15–1.40)
HCT: 21 % — ABNORMAL LOW (ref 39.0–52.0)
HCT: 21 % — ABNORMAL LOW (ref 39.0–52.0)
Hemoglobin: 7.1 g/dL — ABNORMAL LOW (ref 13.0–17.0)
Hemoglobin: 7.1 g/dL — ABNORMAL LOW (ref 13.0–17.0)
O2 Saturation: 87 %
O2 Saturation: 89 %
Patient temperature: 97.9
Patient temperature: 97.9
Potassium: 3.7 mmol/L (ref 3.5–5.1)
Potassium: 3.8 mmol/L (ref 3.5–5.1)
Sodium: 154 mmol/L — ABNORMAL HIGH (ref 135–145)
Sodium: 155 mmol/L — ABNORMAL HIGH (ref 135–145)
TCO2: 27 mmol/L (ref 22–32)
TCO2: 28 mmol/L (ref 22–32)
pCO2 arterial: 33.5 mmHg (ref 32.0–48.0)
pCO2 arterial: 36 mmHg (ref 32.0–48.0)
pH, Arterial: 7.481 — ABNORMAL HIGH (ref 7.350–7.450)
pH, Arterial: 7.495 — ABNORMAL HIGH (ref 7.350–7.450)
pO2, Arterial: 47 mmHg — ABNORMAL LOW (ref 83.0–108.0)
pO2, Arterial: 51 mmHg — ABNORMAL LOW (ref 83.0–108.0)

## 2019-02-26 LAB — HEPATIC FUNCTION PANEL
ALT: 99 U/L — ABNORMAL HIGH (ref 0–44)
AST: 107 U/L — ABNORMAL HIGH (ref 15–41)
Albumin: 1.4 g/dL — ABNORMAL LOW (ref 3.5–5.0)
Alkaline Phosphatase: 378 U/L — ABNORMAL HIGH (ref 38–126)
Bilirubin, Direct: 0.5 mg/dL — ABNORMAL HIGH (ref 0.0–0.2)
Indirect Bilirubin: 0.6 mg/dL (ref 0.3–0.9)
Total Bilirubin: 1.1 mg/dL (ref 0.3–1.2)
Total Protein: 6.9 g/dL (ref 6.5–8.1)

## 2019-02-26 LAB — BASIC METABOLIC PANEL
Anion gap: 7 (ref 5–15)
BUN: 25 mg/dL — ABNORMAL HIGH (ref 8–23)
CO2: 26 mmol/L (ref 22–32)
Calcium: 8.1 mg/dL — ABNORMAL LOW (ref 8.9–10.3)
Chloride: 116 mmol/L — ABNORMAL HIGH (ref 98–111)
Creatinine, Ser: 1.18 mg/dL (ref 0.61–1.24)
GFR calc Af Amer: >60 mL/min (ref >60)
GFR calc non Af Amer: 60 mL/min — ABNORMAL LOW (ref >60)
Glucose, Bld: 231 mg/dL — ABNORMAL HIGH (ref 70–99)
Potassium: 4 mmol/L (ref 3.5–5.1)
Sodium: 149 mmol/L — ABNORMAL HIGH (ref 135–145)

## 2019-02-26 LAB — GLUCOSE, CAPILLARY
Glucose-Capillary: 174 mg/dL — ABNORMAL HIGH (ref 70–99)
Glucose-Capillary: 204 mg/dL — ABNORMAL HIGH (ref 70–99)
Glucose-Capillary: 196 mg/dL — ABNORMAL HIGH (ref 70–99)
Glucose-Capillary: 90 mg/dL (ref 70–99)
Glucose-Capillary: 101 mg/dL — ABNORMAL HIGH (ref 70–99)
Glucose-Capillary: 69 mg/dL — ABNORMAL LOW (ref 70–99)

## 2019-02-26 MED ORDER — FUROSEMIDE 10 MG/ML IJ SOLN
40.0000 mg | Freq: Four times a day (QID) | INTRAMUSCULAR | Status: AC
  Administered 2019-02-26 – 2019-02-27 (×4): 40 mg via INTRAVENOUS
  Filled 2019-02-26 (×4): qty 4

## 2019-02-26 MED ORDER — DEXTROSE 50 % IV SOLN
INTRAVENOUS | Status: AC
  Administered 2019-02-26: 20:00:00 25 mL
  Filled 2019-02-26: qty 50

## 2019-02-26 MED ORDER — METOLAZONE 5 MG PO TABS
5.0000 mg | ORAL_TABLET | Freq: Once | ORAL | Status: AC
  Administered 2019-02-26: 5 mg via ORAL
  Filled 2019-02-26: qty 1

## 2019-02-26 NOTE — Progress Notes (Signed)
Pt nasotracheally suctioned for large amount of frothy cream-colored secretions and then placed on BiPAP.

## 2019-02-26 NOTE — Progress Notes (Signed)
Chart reviewed. B Aubriana Ravelo RN,MHA,BSN Advanced Care Supervisor 

## 2019-02-26 NOTE — Progress Notes (Signed)
Placed patient on 10 lpm high flow. Will continue to monitor patients status

## 2019-02-26 NOTE — Progress Notes (Signed)
Nutrition Follow-up  DOCUMENTATION CODES:   Severe malnutrition in context of chronic illness  INTERVENTION:   When off BiPAP and able to resume TF, recommend:   Vital AF 1.2 at 70 ml/h (1680 ml per day)   Provides 2016 kcal, 126 gm protein, 1362 ml free water daily  NUTRITION DIAGNOSIS:   Severe Malnutrition related to chronic illness(CVA, dementia) as evidenced by severe muscle depletion, severe fat depletion.  Ongoing   GOAL:   Patient will meet greater than or equal to 90% of their needs  Unmet with TF off  MONITOR:   Vent status, TF tolerance, Labs, Skin  ASSESSMENT:   Maurice Stone is a 78 year old male Jehovah's Witness with a history of stroke in 2002 and 2015 with R sided deficits, CAD, COPD, dementia, HTN, T2DM and GERD who presented to the ED after being found down at his residence for an unknown amount of time. Per the ED note, he has not been seen for the past 3 days. A friend found him on the ground only wearing his boxer briefs and was unresponsive. The friend who brought him in says that he is usually fully alert and oriented at his baseline.  Patient was extubated 8/1, but is now requiring BiPAP. TF was put on hold this morning due to BiPAP requirement. Respiratory status is tenuous. MD is discussing goals of care with family.   Labs reviewed. Sodium 149 (H) CBG's: [phone]  Medications reviewed and include vitamin C, ferrous sulfate, lasix, novolog, levemir.   Cortrak was placed 7/28, tip is in the stomach. Receiving free water 300 ml every 4 hours via tube. TF on hold.  Diet Order:   Diet Order            Diet NPO time specified  Diet effective now              EDUCATION NEEDS:   No education needs have been identified at this time  Skin:   DTI: back Stage I: left heel Stage II: coccyx Unstageable: sacrum Other: MASD to scrotum  Last BM:  8/3 (type 7)  Height:   Ht Readings from Last 1 Encounters:  01/25/2019 5\' 11"  (1.803 m)     Weight:   Wt Readings from Last 1 Encounters:  02/26/19 79.8 kg    Ideal Body Weight:  78.2 kg  BMI:  Body mass index is 24.54 kg/m.  Estimated Nutritional Needs:   Kcal:  2000-2200  Protein:  115-130 gm  Fluid:  >/= 2 L    Maurice Stone, RD, LDN, [place] Pager [phone] After Hours Pager [phone]

## 2019-02-26 NOTE — Progress Notes (Signed)
OT Cancellation Note  Patient Details Name: Maurice Stone MRN: [id number] DOB: [birth year]   Cancelled Treatment:    Reason Eval/Treat Not Completed: Patient not medically ready(possible intubation later, Tachy, high RR, on 10+L/O2). OT will continue to follow for appropriateness.   Merri Ray Tamberly Pomplun 02/26/2019, 9:11 AM   Hulda Humphrey OTR/L Acute Rehabilitation Services Pager: [phone] Office: [phone]

## 2019-02-26 NOTE — Progress Notes (Signed)
NAME:  Maurice Stone, MRN:  [id number], DOB:  Oct 30, 1940, LOS: 7 ADMISSION DATE:  02/17/2019, CONSULTATION DATE:  02/12/2019 REFERRING MD:  Dr. Daryll Drown, IMTS CHIEF COMPLAINT: altered mental status  Brief History   61 /male found at home with altered mental status (last seen 3 days prior to admission).  Found to have fever, hypotension, hyperglycemia, and anion gap acidosis from DKA, rhabdomyolysis, AKI and sepsis with E coli bacteremia.  Past Medical History  Jehovah's Witness, COPD, CAD, Dementia, GERD, HTN, CVA, DM, OA  Significant Hospital Events   7/19 admit 7/21 transferred out of ICU 7/24 increasing O2 requirements, probable aspiration, transferred back to ICU 7/26 Extubated, reintubated 8/01 Extubated  8/03 progressive hypoxic respiratory failure, chest x-ray with bilateral pulmonary edema, placed on NIPPV  Consults:  Pharmacy, nutrition, SLP, palliative, oncology  Procedures:  ETT 7/24 > 7/26 ETT 7/26 > 8/1  Significant Diagnostic Tests:   ECHO 2018: elevated PA pressures, normal EF, G1DF  CT head 7/19 > No acute intracranial abnormality. Stable moderate generalized atrophy and mild chronic microvascular ischemic changes of the white matter. Remote lacunar strokes in the left basal ganglia. CT head 7/24 > negative. CT abd/pelvis 7/24 > multiple small lucencies throughout visualized spine and pelvis concerning for multiple myeloma or metastatic disease.  Large lucencies in both femurs.  Possible right pyelonephritis.  RML and RML opacities concerning for PNA. UPEP 7/25 >>  positive M spike Multiple myeloma panel >>  Kappa/lambda light chains 7/29 >> kappa 605, lamda 124, kappa, lamda ratio 4.8  Micro Data:  COVD 7/19 > Negative BC 7/19 > E. Coli sensitivities pending >>  Urine 7/19 >>> E.coli, resistant to ampicillin and Augmentin   COVID 7/24 >> negative  Sputum 7/24 >> rare yeast, consistent with respiratory flora Blood 7/24 >> Negative   Antimicrobials:  Vancomycin 7/19 & 7/24 Cefepime 7/19 >> 7/21 & 7/24 >>7/30 Rocephin 7/20 >> 7/22 & 7/24 Flagyl 7/20 >>  Interim history/subjective:   Patient remains in the intensive care unit.  Worsening hypoxemic respiratory failure.  Placed on BiPAP this morning.  Objective   Temp:  [97.5 F (36.4 C)-99.5 F (37.5 C)] 98.2 F (36.8 C) (08/03 0700) Pulse Rate:  [97-117] 108 (08/03 0800) Resp:  [24-33] 30 (08/03 0800) BP: (138-162)/(69-105) 155/83 (08/03 0800) SpO2:  [87 %-98 %] 95 % (08/03 0800) Weight:  [79.8 kg] 79.8 kg (08/03 0442)      Intake/Output Summary (Last 24 hours) at 02/26/2019 0940 Last data filed at 02/26/2019 0600 Gross per 24 hour  Intake 3499.88 ml  Output 2900 ml  Net 599.88 ml      Examination: General: Elderly male, resting up in bed on high flow nasal cannula.  Tachypneic HEENT: NCAT, sclera clear, weak cough Neuro: Awake following basic commands moving all 4 extremities CV: Regular rate and rhythm, S1-S2 PULM: Bilateral anterior posterior rhonchi GI: Soft, nontender nondistended bowel sounds present Extremities: Warm, bilateral upper and lower extremity dependent edema Skin: No rash   Resolved Problems:  DKA, Metabolic Acidosis, AKI, Rhabdomyolysis  Assessment & Plan:   Acute Hypoxic Respiratory Failure from aspiration pneumonia. Acute bilateral pulmonary edema, acute on chronic diastolic heart failure Volume overload Positive hospital cumulative fluid balance Hx of COPD P: Patient remains in the intensive care unit for close hemodynamic respiratory support. Continue pulmonary hygiene Added NIPPV this morning Repeat stat chest x-ray with significant bilateral pulmonary edema. The patient's images have been independently reviewed by me.   Lasix 40 mg every 6 hours  x4 Metolazone 5 mg x 1 Follow-up urine output within the next hour or 2. I have called family to discuss decompensation. Would recommend durable DNR status.  Please see  separate documentation for advanced care planning.  E-Coli Bacteremia (7/19) Aspiration pneumonia/ventilator associated pneumonia Possible R pyelonephritis. -Completed 7 days of cefepime P: Off antibiotics Continue to monitor clinically  Concern for Multiple Myeloma  Multiple spine and pelvic bone lesion seen on initial imaging. Possible metastatic disease not ruled out, unknown primary P: Light chains as above  Jehovah's Witness but not actively practicing, would accept blood products under extreme circumstances We appreciate oncology input Patient's family has held off on bone marrow biopsy. Patient is on Plavix at this time which may delay biopsy need. Also included in discussion regarding DNR status  Acute metabolic encephalopathy. Hx of CVA, dementia. -baseline function per daughter is independent -Patient's family stated that he has refused assisted living in the past. P: Patient has had stroke on aspirin therefore now on Plavix in the past Continue home Aricept.  Hx of HTN, HLD P: Continue Lipitor Norvasc Cozaar and Lopressor  DM with Neuropathy -resolved DKA.  P: SSI, moderate scale Levemir 25 units daily NovoLog 5 units every 4 for meal coverage  Hypernatremia. Hypokalemia. Hypophosphatemia P: Continue free water Follow electrolytes daily replace as needed  Anemia Thrombocytopenia of critical illness. -hemoglobin 7.4 with admission Hgb 12.2.  P: Minimize phlebotomy Multivitamin and iron supplementation   Moderate protein calorie malnutrition P: Appreciate SLP input Continue tube feeds  Goals of care. -Ongoing discussion with family.  I called [name] his son this morning.  Best practice:  Diet: NPO, tube feeds DVT prophylaxis: protonix GI prophylaxis: SQ heparin Mobility: Bedrest. Code Status: Full Code  Disposition: ICU Family: Updated son on 02/26/2019.  Labs:   CBC: Recent Labs  Lab 02/20/19 0234 02/21/19 0406 02/22/19  0221 02/23/19 0227 02/25/19 0009 02/25/19 2340 02/26/19 0002  WBC 14.8* 15.6* 11.2* 10.2 12.0*  --   --   NEUTROABS  --  12.9*  --   --   --   --   --   HGB 8.2* 8.0* 7.4* 7.3* 7.2* 7.1* 7.1*  HCT 26.6* 25.9* 24.5* 23.8* 23.1* 21.0* 21.0*  MCV 91.1 92.5 92.1 91.2 90.6  --   --   PLT 229 244 261 256 291  --   --    CMP: Recent Labs  Lab 02/22/19 0221 02/23/19 0227 02/24/19 0207 02/25/19 0009 02/25/19 2340 02/26/19 0002 02/26/19 0313  NA 151* 149* 147* 149* 154* 155* 149*  K 3.6 3.8 3.6 3.7 3.7 3.8 4.0  CL 119* 117* 115* 117*  --   --  116*  CO2 _0 --   --  26  GLUCOSE 182* 212* 166* 201*  --   --  231*  BUN 32* 32* 28* 28*  --   --  25*  CREATININE 1.17 1.21 1.23 1.34*  --   --  1.18  CALCIUM 7.7* 7.7* 7.6* 7.9*  --   --  8.1*  MG 2.0 2.0  --   --   --   --   --   PHOS 2.4*  --   --   --   --   --   --   ALBUMIN  --   --  1.1*  --   --   --   --    GFR: Estimated Creatinine Clearance: 56.7 mL/min (by C-G formula based on SCr of  1.18 mg/dL).  Liver Function Tests: Recent Labs  Lab 02/24/19 0207  AST 204*  ALT 122*  ALKPHOS 417*  BILITOT 1.1  PROT 6.2*  ALBUMIN 1.1*   CBG: Recent Labs  Lab 02/25/19 1537 02/25/19 1956 02/25/19 2348 02/26/19 0404 02/26/19 0738  GLUCAP 216* 241* 193* 174* 204*    This patient is critically ill with multiple organ system failure; which, requires frequent high complexity decision making, assessment, support, evaluation, and titration of therapies. This was completed through the application of advanced monitoring technologies and extensive interpretation of multiple databases. During this encounter critical care time was devoted to patient care services described in this note for 36 minutes.  Garner Nash, DO [facility] 02/26/2019 9:40 AM  Personal pager: [phone] If unanswered, please page CCM On-call: [phone]

## 2019-02-26 NOTE — Progress Notes (Signed)
Physical Therapy Wound Treatment Patient Details  Name: Maurice Stone MRN: [id number] Date of Birth: 1940/09/28  Today's Date: 02/26/2019 Time: 0900-0930 Time Calculation (min): 30 min  Subjective  Subjective: Pt alert but not verbal. Labored breathing, tachycardic and tachypnea Patient and Family Stated Goals: None stated Date of Onset: (Unsure)  Pain Score: Pt did not appear to be in pain throughout treatment   Wound Assessment  Pressure Injury 02/23/19 Sacrum Mid Unstageable - Full thickness tissue loss in which the base of the ulcer is covered by slough (yellow, tan, gray, green or brown) and/or eschar (tan, brown or black) in the wound bed. (Active)  Dressing Type ABD;Barrier Film (skin prep);Gauze (Comment);Moist to dry 02/26/19 0940  Dressing Clean;Dry;Intact 02/26/19 0940  Dressing Change Frequency Daily 02/26/19 0940  State of Healing Eschar 02/26/19 0940  Site / Wound Assessment Dressing in place / Unable to assess 02/26/19 0940  % Wound base Red or Granulating 10% 02/26/19 0940  % Wound base Yellow/Fibrinous Exudate 75% 02/26/19 0940  % Wound base Black/Eschar 15% 02/26/19 0940  Peri-wound Assessment Intact 02/26/19 0940  Wound Length (cm) 10 cm 02/23/19 1200  Wound Width (cm) 11.5 cm 02/23/19 1200  Wound Depth (cm) 0.1 cm 02/23/19 1200  Wound Surface Area (cm^2) 115 cm^2 02/23/19 1200  Wound Volume (cm^3) 11.5 cm^3 02/23/19 1200  Margins Unattached edges (unapproximated) 02/26/19 0940  Drainage Amount Moderate 02/26/19 0940  Drainage Description Serosanguineous 02/26/19 0940  Treatment Hydrotherapy (Pulse lavage);Packing (Saline gauze) 02/26/19 0940   Santyl applied to wound bed prior to applying dressing.     Hydrotherapy Pulsed lavage therapy - wound location: Sacrum Pulsed Lavage with Suction (psi): 12 psi Pulsed Lavage with Suction - Normal Saline Used: 1000 mL Pulsed Lavage Tip: Tip with splash shield   Wound Assessment and Plan  Wound Therapy -  Assess/Plan/Recommendations Wound Therapy - Clinical Statement: Pt tachycardic and with tachypnea today. RN states may reintubate later so session focused on pulsed lavage and dressing change to minimize the amount of time pt was positioned on his side. Will continue to follow for selective removal of non-viable tissue, to decrease bioburden and promote wound bed healing.  Wound Therapy - Functional Problem List: Global weakness in the setting of critical illness.  Factors Delaying/Impairing Wound Healing: Diabetes Mellitus;Infection - systemic/local;Immobility;Multiple medical problems;Polypharmacy Hydrotherapy Plan: Debridement;Dressing change;Patient/family education;Pulsatile lavage with suction Wound Therapy - Frequency: 6X / week Wound Therapy - Follow Up Recommendations: Other (comment)(LTACH) Wound Plan: See above  Wound Therapy Goals- Improve the function of patient's integumentary system by progressing the wound(s) through the phases of wound healing (inflammation - proliferation - remodeling) by: Decrease Necrotic Tissue to: 20% Decrease Necrotic Tissue - Progress: Progressing toward goal Increase Granulation Tissue to: 80% Increase Granulation Tissue - Progress: Progressing toward goal Improve Drainage Characteristics: Min;Serous Goals/treatment plan/discharge plan were made with and agreed upon by patient/family: Yes Time For Goal Achievement: 7 days Wound Therapy - Potential for Goals: Good  Goals will be updated until maximal potential achieved or discharge criteria met.  Discharge criteria: when goals achieved, discharge from hospital, MD decision/surgical intervention, no progress towards goals, refusal/missing three consecutive treatments without notification or medical reason.  GP     Thelma Comp 02/26/2019, 9:47 AM   [name], PT, DPT Acute Rehabilitation Services Pager: [phone] Office: [phone]

## 2019-02-26 NOTE — Progress Notes (Signed)
PCCM:  I called the patient's son [name] at the telephone number available in the chart.  We had a long discussion regarding patient's advanced care planning to include CODE STATUS and failure to progress in the intensive care unit.  We discussed all of his current medical comorbidities as well as the diseases which we currently do not have a diagnosis for concern for metastatic lesions within the spine and pelvis versus multiple myeloma.  We also discussed his diastolic heart failure and fluid retention.  Now requiring again NIPPV.  He is also a significant aspiration risk currently with a nasogastric tube for feeding.  Today we also discussed the patient's prior functional status prior to his hospitalization.  Patient's son states that he refused assisted living in the past because of wanting to live independently.  Additionally we discussed potential need for third time intubation mechanical ventilation if he was to continue to fail NIPPV today.  I recommended against reintubation if this was to occur mainly due to his multiple medical comorbidities and slow ICU decline since his hospitalization.  I also made recommendations to consider a durable DNR status.  He is going to talk to his 3 sisters and come up with a final conclusion on their recommendations for this.  It does seem that he understands the severity of his underlying diseases and illness.  28 minutes spent in advanced care planning discussing the above recommendations.  [facility] 02/26/2019 11:02 AM  Personal pager: [phone] If unanswered, please page CCM On-call: [phone]

## 2019-02-26 NOTE — Progress Notes (Signed)
Due to frequent nasotracheal suctioning, nasal trumpet inserted at this time by RT with RN assisting. Pt tolerated well.

## 2019-02-26 NOTE — Progress Notes (Signed)
Pt nasotracheally suctioned for large amount of thin white/yellow secretions.  No adverse reactions to procedure noted.

## 2019-02-26 NOTE — Progress Notes (Signed)
CSW spoke with Corina at [facility] who stated that due to this patient only having Medicare part A, he does not have LTAC benefits.  Madilyn Fireman, MSW, LCSW-A Clinical Social Worker [facility] [phone]

## 2019-02-26 NOTE — Progress Notes (Signed)
Daily Progress Note   Patient Name: Maurice Stone       Date: 02/26/2019 DOB: [birth year]  Age: 78 y.o. MRN#: [id number] Attending Physician: Garner Nash, DO Primary Care Physician: [facility] Admit Date: 01/25/2019  Reason for Consultation/Follow-up: Establishing goals of care and Psychosocial/spiritual support  Subjective: Received call from [facility] heard that my father is dying.  Can we come visit".  I went to see Maurice Stone.  He is now on BiPap but appears stable.  Per respiratory his secretions were thick and he was not able to breathe effectively.  I spoke with Luella Cook, son of patient.  Maurice Stone had a good talk with Dr. Valeta Harms this morning.  He has spoken to each of his siblings since.  They have decided together to change Mr. Schellinger code status to DNR.  They have decided never to [facility] as well.    They would like to continue current care with Bipap.  Maurice Stone asked about the patient's e-coli bacteremia infection - has it been remedied?  We talked about his low grade temp and slightly elevated WBC count.    I did speak with Maurice Stone about what would happen if things did not go well.  We discussed a hospital death vs [place].  Maurice Stone's main concern was that the 4 children be allowed to be with him.  After the call I discussed it with the RN and Dr. Valeta Harms.  Dr. Valeta Harms stated he would call Maurice Stone to answer his question about the infection.  ( It is likely re-current aspiration).   Assessment: Patient's respiratory status worsened.  Family is becoming more focused on the patient's dignity and comfort.  They do not want to re-intubate but remain hopeful that he will improve on BiPAP  Patient Profile/HPI:  78 y.o. male  with past medical history of coronary artery disease,  CVA 2002, 2015; diabetes type 2, GERD, hypertension, COPD, dementia, admitted on 02/06/2019 after being found down (potentially for 3 days), with fever, hypotension, tachycardia.  He was diagnosed with E. coli bacteremia as well as DKA.  He was started on antibiotics and insulin drip.  He was initially admitted to the ICU on 02/19/2019 and transferred out of ICU on 02/13/2019.  On 02/16/2019 he had increased oxygen demands, questionable aspiration, and was transferred back to  ICU where he has now been intubated.  He is currently weaning. CT of the abdomen performed on 02/16/2019 revealed new onset of either multiple myeloma or metastatic bony disease from unknown primary  Consult ordered for goals of care and psychosocial support in the setting of new diagnosis of cancer with underlying poor functional status, recurrent respiratory failure.      Length of Stay: 15  Current Medications: Scheduled Meds:  . ascorbic acid  250 mg Per Tube BID  . chlorhexidine  15 mL Mouth Rinse BID  . Chlorhexidine Gluconate Cloth  6 each Topical Q0600  . clopidogrel  75 mg Per Tube Daily  . collagenase   Topical Daily  . donepezil  10 mg Oral QHS  . ferrous sulfate  300 mg Oral BID WC  . free water  300 mL Per Tube Q4H  . furosemide  40 mg Intravenous Q6H  . heparin injection (subcutaneous)  5,000 Units Subcutaneous Q8H  . insulin aspart  0-15 Units Subcutaneous Q4H  . insulin aspart  5 Units Subcutaneous Q4H  . insulin detemir  25 Units Subcutaneous Daily  . mouth rinse  15 mL Mouth Rinse q12n4p  . pantoprazole sodium  40 mg Per Tube Q24H  . sertraline  100 mg Per Tube QHS  . sodium chloride flush  10-40 mL Intracatheter Q12H  . sodium chloride flush  3 mL Intravenous Q12H    Continuous Infusions: . sodium chloride 10 mL/hr at 02/26/19 0600  . feeding supplement (VITAL AF 1.2 CAL) 1,000 mL (02/25/19 2200)    PRN Meds: sodium chloride, acetaminophen, ipratropium-albuterol, polyethylene glycol, sodium  chloride flush  Physical Exam        Well developed male.  On BiPAP.  1 arm restrained.  Opens eyes to my voice and makes eye contact. CV tachy resp no frank w/c/r  Abdomen soft, nt, nd   Vital Signs: BP 136/81   Pulse (!) 111   Temp 99.6 F (37.6 C) (Axillary)   Resp (!) 28   Ht '5\' 11"'  (1.803 m)   Wt 79.8 kg   SpO2 100%   BMI 24.54 kg/m  SpO2: SpO2: 100 % O2 Device: O2 Device: Bi-PAP O2 Flow Rate: O2 Flow Rate (L/min): 10 L/min  Intake/output summary:   Intake/Output Summary (Last 24 hours) at 02/26/2019 1427 Last data filed at 02/26/2019 1300 Gross per 24 hour  Intake 3009.9 ml  Output 4175 ml  Net -1165.1 ml   LBM: Last BM Date: 02/26/19 Baseline Weight: Weight: 78.8 kg Most recent weight: Weight: 79.8 kg       Palliative Assessment/Data: 20%      Patient Active Problem List   Diagnosis Date Noted  . Encounter for feeding tube placement   . Palliative care encounter   . Respiratory failure ([place])   . Ventilator dependent ([place])   . Multiple myeloma not having achieved remission ([place])   . Endotracheally intubated   . Pressure injury of skin 02/17/2019  . Goals of care, counseling/discussion   . Palliative care by specialist   . Protein-calorie malnutrition, severe 02/14/2019  . Altered mental status   . DKA (diabetic ketoacidosis) ([place]) 02/16/2019  . E coli bacteremia 05/02/2017  . Lower urinary tract infectious disease   . Sepsis ([place])   . Insulin dependent type 2 diabetes mellitus, uncontrolled ([place])   . Hemorrhoids   . Diabetic mononeuropathy associated with type 2 diabetes mellitus ([place])   . Hyperglycemia 05/01/2017  . Hyperlipidemia   . History of  CVA with residual deficit   . TIA (transient ischemic attack) 02/02/2015  . Numbness 03/29/2014  . Stroke ([place]) 03/28/2014  . Diabetes mellitus ([place]) 03/28/2014  . CVA (cerebral infarction) 01/18/2014  . Acute CVA (cerebrovascular accident) ([place]) 01/17/2014  . CVA (cerebral vascular accident) ([place])  01/15/2014  . Essential hypertension 01/15/2014  . Bradycardia 01/15/2014  . chronic Right sided weakness 01/15/2014  . GERD (gastroesophageal reflux disease) 01/15/2014  . Neuropathy 01/15/2014    Palliative Care Plan    Recommendations/Plan:  Code status changed to DNR.  No Trach.  Please continue current care.  Family hopeful for recovery. PMT will continue to follow with you.  I return on Friday.  I will request a colleague to check in on him, but please call our office if help is needed more urgently.   Goals of Care and Additional Recommendations:  Limitations on Scope of Treatment: Full Scope Treatment  Code Status:  DNR  Prognosis:  Unable to determine  Patient is very debilitated, recurrent infections, aspiration, thick secretions.  He is at high risk for respiratory decompensation and death.  Discharge Planning:  To Be Determined  Hospital death would not be surprising.  Care plan was discussed with CCM MD, son Maurice Stone.  Thank you for allowing the Palliative Medicine Team to assist in the care of this patient.  Total time spent:  35 min.     Greater than 50%  of this time was spent counseling and coordinating care related to the above assessment and plan.  Florentina Jenny, PA-C Palliative Medicine  Please contact Palliative MedicineTeam phone at [phone] for questions and concerns between 7 am - 7 pm.   Please see AMION for individual provider pager numbers.

## 2019-02-27 ENCOUNTER — Inpatient Hospital Stay (HOSPITAL_COMMUNITY): Payer: Medicare Other

## 2019-02-27 DIAGNOSIS — J690 Pneumonitis due to inhalation of food and vomit: Secondary | ICD-10-CM

## 2019-02-27 DIAGNOSIS — E0810 Diabetes mellitus due to underlying condition with ketoacidosis without coma: Secondary | ICD-10-CM

## 2019-02-27 DIAGNOSIS — E43 Unspecified severe protein-calorie malnutrition: Secondary | ICD-10-CM

## 2019-02-27 DIAGNOSIS — Z66 Do not resuscitate: Secondary | ICD-10-CM

## 2019-02-27 LAB — COMPREHENSIVE METABOLIC PANEL
ALT: 80 U/L — ABNORMAL HIGH (ref 0–44)
AST: 82 U/L — ABNORMAL HIGH (ref 15–41)
Albumin: 1.5 g/dL — ABNORMAL LOW (ref 3.5–5.0)
Alkaline Phosphatase: 344 U/L — ABNORMAL HIGH (ref 38–126)
Anion gap: 11 (ref 5–15)
BUN: 26 mg/dL — ABNORMAL HIGH (ref 8–23)
CO2: 31 mmol/L (ref 22–32)
Calcium: 8.5 mg/dL — ABNORMAL LOW (ref 8.9–10.3)
Chloride: 109 mmol/L (ref 98–111)
Creatinine, Ser: 1.25 mg/dL — ABNORMAL HIGH (ref 0.61–1.24)
GFR calc Af Amer: >60 mL/min (ref >60)
GFR calc non Af Amer: 56 mL/min — ABNORMAL LOW (ref >60)
Glucose, Bld: 103 mg/dL — ABNORMAL HIGH (ref 70–99)
Potassium: 3.3 mmol/L — ABNORMAL LOW (ref 3.5–5.1)
Sodium: 151 mmol/L — ABNORMAL HIGH (ref 135–145)
Total Bilirubin: 1.4 mg/dL — ABNORMAL HIGH (ref 0.3–1.2)
Total Protein: 7.2 g/dL (ref 6.5–8.1)

## 2019-02-27 LAB — CBC
HCT: 24.7 % — ABNORMAL LOW (ref 39.0–52.0)
HCT: 26.7 % — ABNORMAL LOW (ref 39.0–52.0)
Hemoglobin: 7.6 g/dL — ABNORMAL LOW (ref 13.0–17.0)
Hemoglobin: 8 g/dL — ABNORMAL LOW (ref 13.0–17.0)
MCH: 27.7 pg (ref 26.0–34.0)
MCH: 28 pg (ref 26.0–34.0)
MCHC: 30 g/dL (ref 30.0–36.0)
MCHC: 30.8 g/dL (ref 30.0–36.0)
MCV: 91.1 fL (ref 80.0–100.0)
MCV: 92.4 fL (ref 80.0–100.0)
Platelets: 328 10*3/uL (ref 150–400)
Platelets: 348 10*3/uL (ref 150–400)
RBC: 2.71 MIL/uL — ABNORMAL LOW (ref 4.22–5.81)
RBC: 2.89 MIL/uL — ABNORMAL LOW (ref 4.22–5.81)
RDW: 17.1 % — ABNORMAL HIGH (ref 11.5–15.5)
RDW: 17.2 % — ABNORMAL HIGH (ref 11.5–15.5)
WBC: 10.7 10*3/uL — ABNORMAL HIGH (ref 4.0–10.5)
WBC: 11.6 10*3/uL — ABNORMAL HIGH (ref 4.0–10.5)
nRBC: 1.3 % — ABNORMAL HIGH (ref 0.0–0.2)
nRBC: 1.5 % — ABNORMAL HIGH (ref 0.0–0.2)

## 2019-02-27 LAB — BASIC METABOLIC PANEL
Anion gap: 13 (ref 5–15)
Anion gap: 11 (ref 5–15)
BUN: 28 mg/dL — ABNORMAL HIGH (ref 8–23)
BUN: 33 mg/dL — ABNORMAL HIGH (ref 8–23)
CO2: 31 mmol/L (ref 22–32)
CO2: 32 mmol/L (ref 22–32)
Calcium: 8.5 mg/dL — ABNORMAL LOW (ref 8.9–10.3)
Calcium: 8.4 mg/dL — ABNORMAL LOW (ref 8.9–10.3)
Chloride: 105 mmol/L (ref 98–111)
Chloride: 104 mmol/L (ref 98–111)
Creatinine, Ser: 1.35 mg/dL — ABNORMAL HIGH (ref 0.61–1.24)
Creatinine, Ser: 1.36 mg/dL — ABNORMAL HIGH (ref 0.61–1.24)
GFR calc Af Amer: 59 mL/min — ABNORMAL LOW (ref >60)
GFR calc Af Amer: 58 mL/min — ABNORMAL LOW (ref >60)
GFR calc non Af Amer: 51 mL/min — ABNORMAL LOW (ref >60)
GFR calc non Af Amer: 50 mL/min — ABNORMAL LOW (ref >60)
Glucose, Bld: 212 mg/dL — ABNORMAL HIGH (ref 70–99)
Glucose, Bld: 209 mg/dL — ABNORMAL HIGH (ref 70–99)
Potassium: 3.9 mmol/L (ref 3.5–5.1)
Potassium: 3.6 mmol/L (ref 3.5–5.1)
Sodium: 149 mmol/L — ABNORMAL HIGH (ref 135–145)
Sodium: 147 mmol/L — ABNORMAL HIGH (ref 135–145)

## 2019-02-27 LAB — GLUCOSE, CAPILLARY
Glucose-Capillary: 160 mg/dL — ABNORMAL HIGH (ref 70–99)
Glucose-Capillary: 178 mg/dL — ABNORMAL HIGH (ref 70–99)
Glucose-Capillary: 181 mg/dL — ABNORMAL HIGH (ref 70–99)
Glucose-Capillary: 186 mg/dL — ABNORMAL HIGH (ref 70–99)
Glucose-Capillary: 199 mg/dL — ABNORMAL HIGH (ref 70–99)
Glucose-Capillary: 208 mg/dL — ABNORMAL HIGH (ref 70–99)
Glucose-Capillary: 93 mg/dL (ref 70–99)

## 2019-02-27 MED ORDER — FUROSEMIDE 10 MG/ML IJ SOLN
40.0000 mg | Freq: Two times a day (BID) | INTRAMUSCULAR | Status: AC
  Administered 2019-02-27 (×2): 40 mg via INTRAVENOUS
  Filled 2019-02-27 (×2): qty 4

## 2019-02-27 MED ORDER — CHLORHEXIDINE GLUCONATE 0.12 % MT SOLN
15.0000 mL | Freq: Two times a day (BID) | OROMUCOSAL | Status: DC
  Administered 2019-02-27 – 2019-03-04 (×11): 15 mL via OROMUCOSAL
  Filled 2019-02-27 (×7): qty 15

## 2019-02-27 MED ORDER — POTASSIUM CHLORIDE 20 MEQ/15ML (10%) PO SOLN
40.0000 meq | Freq: Once | ORAL | Status: AC
  Administered 2019-02-27: 02:00:00 40 meq
  Filled 2019-02-27: qty 30

## 2019-02-27 MED ORDER — ORAL CARE MOUTH RINSE
15.0000 mL | Freq: Two times a day (BID) | OROMUCOSAL | Status: DC
  Administered 2019-02-27 – 2019-03-04 (×12): 15 mL via OROMUCOSAL

## 2019-02-27 NOTE — Progress Notes (Signed)
  Speech Language Pathology Treatment: Dysphagia  Patient Details Name: Maurice Stone MRN: [id number] DOB: 05-06-1941 Today's Date: 02/27/2019 Time: 1410-3013 SLP Time Calculation (min) (ACUTE ONLY): 20 min  Assessment / Plan / Recommendation Clinical Impression  Pt remains a high risk for aspiration, exhibiting signs of reduced secretion management, aspiration, and dysphagia. Oral holding was noted without initiation of pharyngeal swallow despite Max cues from SLP, with pt coughing on what is suspected to be a combination of spillage from melted ice and secretions that are audibly present. He does not cough on command, and his spontaneous cough is too weak to eject anything even into his oral cavity to be assisted with a yankauer. Would continue NPO status for now, pending any changes in overall GOC.   HPI HPI: 78 y.o.malewith past medical history of coronary artery disease, CVA 2002, 2015; diabetes type 2, GERD, hypertension, COPD, dementia,admitted on 07/23/2020after being found down (potentially for 3 days), with fever, hypotension, tachycardia. He was diagnosed with E. coli bacteremia as well as DKA. He was started on antibiotics and insulin drip. He was initially admitted to the ICU on 02/20/2019 and transferred out of ICU on 02/13/2019.ST familiar with pt from initial eval 7/21 and at that time was lethargic- no swallow reponse observed with water. Seen 7/24 in which he was alert however held water with labial leakage, delayed swallow and s/s aspiration. Pt then intubated 7/24-7/26 , reintubated and extubated on 8/1. Family reports they would want full scope of care. CT of the abdomen performed on 02/16/2019 revealed new onset of either multiple myeloma or metastatic bony disease from unknown primary.       SLP Plan  Continue with current plan of care       Recommendations  Diet recommendations: NPO Medication Administration: Via alternative means                Oral Care  Recommendations: Oral care QID Follow up Recommendations: Skilled Nursing facility SLP Visit Diagnosis: Dysphagia, oropharyngeal phase (R13.12) Plan: Continue with current plan of care       GO                Maurice Stone 02/27/2019, 12:13 PM  Maurice Stone, M.A. [facility] Environmental education officer [phone] Office [phone]

## 2019-02-27 NOTE — Progress Notes (Signed)
Spoke w/ pts son and provided updates. Pts son appreciative.

## 2019-02-27 NOTE — Progress Notes (Signed)
Daily Progress Note   Patient Name: Maurice Stone       Date: 02/27/2019 DOB: [birth year]  Age: 78 y.o. MRN#: [id number] Attending Physician: Garner Nash, DO Primary Care Physician: [facility] Admit Date: 02/08/2019  Reason for Consultation/Follow-up: Establishing goals of care  Subjective: Upon arrival to room, patient awake and alert to person and place. Reoriented him to situation. Audible secretions which he is trying to cough up, but weak cough. Denies pain or discomfort. On HFNC.  GOC:  Received call from son, [name] regarding plan of care and plan for transfer out of ICU today. [name] and his sisters question if their father's secretions will be able to be managed on another unit. Explained that respiratory therapists will continue to follow with active orders for chest PT, NT suctioning if absolutely necessary, and breathing treatments/incentive spirometry.   We did discuss the high risk for aspiration with audible secretions and very weak cough. Explained plan for SLP evaluation today.   [name] is reassured by the fact that he is not needing BiPAP but also understands his father remains on high amounts of oxygen and with high risk for another episode of respiratory distress. Explained that we may near a point where he needs comfort medications to maintain comfort and relief from suffering. [name] confirmed family decision yesterday for DNR but continuing current plan of care with hopes of improvement.   Reassured [name] of ongoing palliative support. Answered questions and concerns. [name] has PMT contact information.   Length of Stay: 16  Current Medications: Scheduled Meds:  . ascorbic acid  250 mg Per Tube BID  . chlorhexidine  15 mL Mouth Rinse BID  .  Chlorhexidine Gluconate Cloth  6 each Topical Q0600  . clopidogrel  75 mg Per Tube Daily  . collagenase   Topical Daily  . donepezil  10 mg Oral QHS  . ferrous sulfate  300 mg Oral BID WC  . free water  300 mL Per Tube Q4H  . furosemide  40 mg Intravenous Q12H  . heparin injection (subcutaneous)  5,000 Units Subcutaneous Q8H  . insulin aspart  0-15 Units Subcutaneous Q4H  . insulin aspart  5 Units Subcutaneous Q4H  . insulin detemir  25 Units Subcutaneous Daily  . mouth rinse  15 mL Mouth Rinse q12n4p  .  pantoprazole sodium  40 mg Per Tube Q24H  . sertraline  100 mg Per Tube QHS  . sodium chloride flush  10-40 mL Intracatheter Q12H  . sodium chloride flush  3 mL Intravenous Q12H    Continuous Infusions: . sodium chloride 10 mL/hr at 02/27/19 0700  . feeding supplement (VITAL AF 1.2 CAL) 70 mL/hr at 02/27/19 0907    PRN Meds: sodium chloride, acetaminophen, ipratropium-albuterol, polyethylene glycol, sodium chloride flush  Physical Exam Vitals signs and nursing note reviewed.  Constitutional:      General: He is awake.     Appearance: He is ill-appearing.  HENT:     Head: Normocephalic and atraumatic.  Cardiovascular:     Rate and Rhythm: Normal rate.  Pulmonary:     Effort: No tachypnea, accessory muscle usage or respiratory distress.     Breath sounds: Decreased breath sounds and rhonchi present.     Comments: Audible secretions. Weak cough. HFNC. Abdominal:     Tenderness: There is no abdominal tenderness.  Skin:    General: Skin is warm and dry.  Neurological:     Mental Status: He is alert.     Comments: Oriented to person and [facility]. Reoriented to situation.            Vital Signs: BP 136/74   Pulse (!) 107   Temp 99.1 F (37.3 C) (Oral)   Resp 14   Ht '5\' 11"'  (1.803 m)   Wt 73.5 kg   SpO2 99%   BMI 22.60 kg/m  SpO2: SpO2: 99 % O2 Device: O2 Device: High Flow Nasal Cannula O2 Flow Rate: O2 Flow Rate (L/min): 10 L/min  Intake/output summary:    Intake/Output Summary (Last 24 hours) at 02/27/2019 1209 Last data filed at 02/27/2019 1200 Gross per 24 hour  Intake 1913.42 ml  Output 8125 ml  Net -6211.58 ml   LBM: Last BM Date: 02/27/19 Baseline Weight: Weight: 78.8 kg Most recent weight: Weight: 73.5 kg       Palliative Assessment/Data: PPS 30%    Flowsheet Rows     Most Recent Value  Intake Tab  Referral Department  Critical care  Unit at Time of Referral  ICU  Palliative Care Primary Diagnosis  Sepsis/Infectious Disease  Date Notified  02/16/19  Palliative Care Type  New Palliative care  Reason for referral  Clarify Goals of Care, Psychosocial or Spiritual support  Date of Admission  02/02/2019  Date first seen by Palliative Care  02/17/19  # of days Palliative referral response time  1 Day(s)  # of days IP prior to Palliative referral  5  Clinical Assessment  Palliative Performance Scale Score  30%  Pain Max last 24 hours  Not able to report  Pain Min Last 24 hours  Not able to report  Dyspnea Max Last 24 Hours  Not able to report  Dyspnea Min Last 24 hours  Not able to report  Nausea Max Last 24 Hours  Not able to report  Nausea Min Last 24 Hours  Not able to report  Anxiety Max Last 24 Hours  Not able to report  Anxiety Min Last 24 Hours  Not able to report  Other Max Last 24 Hours  Not able to report  Psychosocial & Spiritual Assessment  Palliative Care Outcomes  Patient/Family meeting held?  Yes  Who was at the meeting?  dtr, karen bolden  Palliative Care Outcomes  Provided psychosocial or spiritual support      Patient Active  Problem List   Diagnosis Date Noted  . Acute hypoxemic respiratory failure ([place])   . DNR (do not resuscitate)   . Encounter for feeding tube placement   . Palliative care encounter   . Respiratory failure ([place])   . Ventilator dependent ([place])   . Multiple myeloma not having achieved remission ([place])   . Endotracheally intubated   . Pressure injury of skin 02/17/2019  . Goals of  care, counseling/discussion   . Palliative care by specialist   . Protein-calorie malnutrition, severe 02/14/2019  . Altered mental status   . DKA (diabetic ketoacidosis) ([place]) 02/22/2019  . E coli bacteremia 05/02/2017  . Lower urinary tract infectious disease   . Sepsis ([place])   . Insulin dependent type 2 diabetes mellitus, uncontrolled ([place])   . Hemorrhoids   . Diabetic mononeuropathy associated with type 2 diabetes mellitus ([place])   . Hyperglycemia 05/01/2017  . Hyperlipidemia   . History of CVA with residual deficit   . TIA (transient ischemic attack) 02/02/2015  . Numbness 03/29/2014  . Stroke ([place]) 03/28/2014  . Diabetes mellitus ([place]) 03/28/2014  . CVA (cerebral infarction) 01/18/2014  . Acute CVA (cerebrovascular accident) ([place]) 01/17/2014  . CVA (cerebral vascular accident) ([place]) 01/15/2014  . Essential hypertension 01/15/2014  . Bradycardia 01/15/2014  . chronic Right sided weakness 01/15/2014  . GERD (gastroesophageal reflux disease) 01/15/2014  . Neuropathy 01/15/2014    Palliative Care Assessment & Plan   Patient Profile: 78 y.o.malewith past medical history of coronary artery disease, CVA 2002, 2015; diabetes type 2, GERD, hypertension, COPD, dementia,admitted on 07/22/2020after being found down (potentially for 3 days), with fever, hypotension, tachycardia. He was diagnosed with E. coli bacteremia as well as DKA. He was started on antibiotics and insulin drip. He was initially admitted to the ICU on 02/03/2019 and transferred out of ICU on 02/13/2019. On 02/16/2019 he had increased oxygen demands, questionable aspiration, and was transferred back to ICU where he has now been intubated. 8/1 extubated but 8/3 developed progressive hypoxic respiratory failure. CXR with bilateral pulmonary edema. Patient placed on BiPAP and family decision for DNR.   CT of the abdomen performed on 02/16/2019 revealed new onset of either multiple myeloma or metastatic bony disease from  unknown primary  Consult ordered for goals of care and psychosocial support in the setting of new diagnosis of cancer with underlying poor functional status, recurrent respiratory failure.   Assessment: Acute hypoxic respiratory failure  Aspiration pneumonia Acute bilateral pulmonary edema Acute on chronic diastolic CHF E Coli Bacteremia Concern for multiple myeloma (multiple spine and pelvic bone lesions on initial imaging) Acute metabolic encephalopathy Hx of CVA Moderate protein calorie malnutrition  Recommendations/Plan:  DNR. No trach. Transfer out of ICU today.  Continue current medical management and plan of care. Family remains hopeful for improvement. Spoke with son about high risk for aspiration leading to further decline and need for comfort medications.   SLP evaluation today.  PMT will continue to follow and support patient/family inpatient.    Code Status: DNR   Code Status Orders  (From admission, onward)         Start     Ordered   02/26/19 1438  Do not attempt resuscitation (DNR)  Continuous    Question Answer Comment  In the event of cardiac or respiratory ARREST Do not call a "code blue"   In the event of cardiac or respiratory ARREST Do not perform Intubation, CPR, defibrillation or ACLS   In the event of cardiac  or respiratory ARREST Use medication by any route, position, wound care, and other measures to relive pain and suffering. May use oxygen, suction and manual treatment of airway obstruction as needed for comfort.      02/26/19 1437        Code Status History    Date Active Date Inactive Code Status Order ID Comments User Context   02/08/2019 1843 02/26/2019 1437 Full Code [id number]  Neva Seat, MD ED   01/30/2019 1837 02/08/2019 1843 Full Code [id number]  Neva Seat, MD ED   05/01/2017 1900 05/08/2017 2042 Full Code [id number]  Alphonzo Grieve, MD Inpatient   12/10/2016 2149 12/11/2016 2324 Full Code [id number]  Etta Quill, DO ED    02/02/2015 1815 02/03/2015 1857 Full Code [id number]  Nita Sells, MD Inpatient   03/28/2014 1930 04/01/2014 1843 Full Code [id number]  Hosie Poisson, MD Inpatient   01/18/2014 1609 02/02/2014 1421 Full Code [id number]  Cathlyn Parsons, PA-C Inpatient   01/18/2014 1609 01/18/2014 1609 Full Code [id number]  Cathlyn Parsons, PA-C Inpatient   01/15/2014 2158 01/18/2014 1609 Full Code [id number]  Berle Mull, MD Inpatient   Advance Care Planning Activity       Prognosis:  Poor prognosis: high risk for aspiration leading to respiratory distress/decompensation again.  Discharge Planning:  To Be Determined  Care plan was discussed with patient, RN, son [name])  Thank you for allowing the Palliative Medicine Team to assist in the care of this patient.   Time In: 1145 Time Out: 1220 Total Time 35 Prolonged Time Billed  no      Greater than 50%  of this time was spent counseling and coordinating care related to the above assessment and plan.  Ihor Dow, FNP-C Palliative Medicine Team  Phone: [phone] Fax: [phone]  Please contact Palliative Medicine Team phone at [phone] for questions and concerns.

## 2019-02-27 NOTE — Progress Notes (Addendum)
NAME:  Maurice Stone, MRN:  [id number], DOB:  Dec 15, 1940, LOS: 7 ADMISSION DATE:  02/03/2019, CONSULTATION DATE:  02/12/2019 REFERRING MD:  Dr. Daryll Drown, IMTS CHIEF COMPLAINT: altered mental status  Brief History   78 /male found at home with altered mental status (last seen 3 days prior to admission).  Found to have fever, hypotension, hyperglycemia, and anion gap acidosis from DKA, rhabdomyolysis, AKI and sepsis with E coli bacteremia.  Past Medical History  Jehovah's Witness, COPD, CAD, Dementia, GERD, HTN, CVA, DM, OA  Significant Hospital Events   7/19 admit 7/21 transferred out of ICU 7/24 increasing O2 requirements, probable aspiration, transferred back to ICU 7/26 Extubated, reintubated 8/01 Extubated  8/03 progressive hypoxic respiratory failure, chest x-ray with bilateral pulmonary edema, placed on NIPPV  Consults:  Pharmacy, nutrition, SLP, palliative, oncology  Procedures:  ETT 7/24 > 7/26 ETT 7/26 > 8/1  Significant Diagnostic Tests:   ECHO 2018: elevated PA pressures, normal EF, G1DF  CT head 7/19 > No acute intracranial abnormality. Stable moderate generalized atrophy and mild chronic microvascular ischemic changes of the white matter. Remote lacunar strokes in the left basal ganglia. CT head 7/24 > negative. CT abd/pelvis 7/24 > multiple small lucencies throughout visualized spine and pelvis concerning for multiple myeloma or metastatic disease.  Large lucencies in both femurs.  Possible right pyelonephritis.  RML and RML opacities concerning for PNA. UPEP 7/25 >>  positive M spike Multiple myeloma panel >>  Kappa/lambda light chains 7/29 >> kappa 605, lamda 124, kappa, lamda ratio 4.8  Micro Data:  COVD 7/19 > Negative BC 7/19 > E. Coli sensitivities pending >>  Urine 7/19 >>> E.coli, resistant to ampicillin and Augmentin   COVID 7/24 >> negative  Sputum 7/24 >> rare yeast, consistent with respiratory flora Blood 7/24 >> Negative   Antimicrobials:  Vancomycin 7/19 & 7/24 Cefepime 7/19 >> 7/21 & 7/24 >>7/30 Rocephin 7/20 >> 7/22 & 7/24 Flagyl 7/20 >>  Interim history/subjective:  Currently off BiPAP Hemodynamically stable Now a DNR Transferred to stepdown unit or floor with tried hospital service.  Objective   Temp:  [97.7 F (36.5 C)-99.6 F (37.6 C)] 99.1 F (37.3 C) (08/04 0727) Pulse Rate:  [88-126] 95 (08/04 0727) Resp:  [20-34] 30 (08/04 0727) BP: (96-151)/(49-85) 125/69 (08/04 0727) SpO2:  [96 %-100 %] 97 % (08/04 0727) FiO2 (%):  [45 %-50 %] 45 % (08/03 1400) Weight:  [73.5 kg] 73.5 kg (08/04 0400)      Intake/Output Summary (Last 24 hours) at 02/27/2019 8676 Last data filed at 02/27/2019 0900 Gross per 24 hour  Intake 1853.71 ml  Output 8550 ml  Net -6696.29 ml   FiO2 (%):  [45 %-50 %] 45 %  Examination: General: Frail elderly male no acute distress at rest HEENT: No JVD or lymphadenopathy is appreciated Neuro: Lethargic but able to follow some commands CV: Heart sounds are distant PULM: Coarse rhonchi with bibasilar crackles GI: soft, bsx4 active  Extremities: warm/dry, 1+ edema  Skin: no rashes or lesions    Resolved Problems:  DKA, Metabolic Acidosis, AKI, Rhabdomyolysis  Assessment & Plan:   Acute Hypoxic Respiratory Failure from aspiration pneumonia. Acute bilateral pulmonary edema, acute on chronic diastolic heart failure Volume overload Positive hospital cumulative fluid balance Hx of COPD P: Continue diuresis at a less aggressive mode O2 as needed, wean as tolerated No need for BiPAP at this time Transfer out of the ICU unit  E-Coli Bacteremia (7/19) Aspiration pneumonia/ventilator associated pneumonia Possible R pyelonephritis. -Completed  7 days of cefepime P. Completed antimicrobial therapy  Concern for Multiple Myeloma  Multiple spine and pelvic bone lesion seen on initial imaging. Possible metastatic disease not ruled out, unknown primary P: Blood  products as needed Oncology input is appreciated Now a DNR Transfer out of intensive care unit  Acute metabolic encephalopathy. Hx of CVA, dementia. -baseline function per daughter is independent -Patient's family stated that he has refused assisted living in the past. P: Continue current treatment  Hx of HTN, HLD P: Continue home medications as tolerated  DM with Neuropathy -resolved DKA.  P: Sliding scale insulin protocol Levemir daily  Hypernatremia. Hypokalemia. Hypophosphatemia Recent Labs  Lab 02/26/19 0002 02/26/19 0313 02/27/19 0018  NA 155* 149* 151*   Recent Labs  Lab 02/26/19 0002 02/26/19 0313 02/27/19 0018  K 3.8 4.0 3.3*    P: Replete electrolytes Continue diuresis  Anemia Thrombocytopenia of critical illness. -hemoglobin 7.4 with admission Hgb 12.2.  Recent Labs    02/26/19 0002 02/27/19 0018  HGB 7.1* 7.6*    P: Transfuse per protocol   Moderate protein calorie malnutrition P: Continue speech evaluation  Diet if passes speech evaluation  Goals of care. 02/26/2019 goals of care discussed with son [name] now a DNR   Best practice:  Diet: NPO, tube feeds DVT prophylaxis: protonix GI prophylaxis: SQ heparin Mobility: Bedrest. Code Status: Full Code  Disposition: ICU Family: Son updated on 02/26/2019.  Plan to move out of ICU  Labs:   CBC: Recent Labs  Lab 02/21/19 0406 02/22/19 0221 02/23/19 0227 02/25/19 0009 02/25/19 2340 02/26/19 0002 02/27/19 0018  WBC 15.6* 11.2* 10.2 12.0*  --   --  11.6*  NEUTROABS 12.9*  --   --   --   --   --   --   HGB 8.0* 7.4* 7.3* 7.2* 7.1* 7.1* 7.6*  HCT 25.9* 24.5* 23.8* 23.1* 21.0* 21.0* 24.7*  MCV 92.5 92.1 91.2 90.6  --   --  91.1  PLT 244 261 256 291  --   --  348   CMP: Recent Labs  Lab 02/22/19 0221 02/23/19 0227 02/24/19 0207 02/25/19 0009 02/25/19 2340 02/26/19 0002 02/26/19 0313 02/27/19 0018  NA 151* 149* 147* 149* 154* 155* 149* 151*  K 3.6 3.8 3.6 3.7 3.7  3.8 4.0 3.3*  CL 119* 117* 115* 117*  --   --  116* 109  CO2 _0 --   --  26 31  GLUCOSE 182* 212* 166* 201*  --   --  231* 103*  BUN 32* 32* 28* 28*  --   --  25* 26*  CREATININE 1.17 1.21 1.23 1.34*  --   --  1.18 1.25*  CALCIUM 7.7* 7.7* 7.6* 7.9*  --   --  8.1* 8.5*  MG 2.0 2.0  --   --   --   --   --   --   PHOS 2.4*  --   --   --   --   --   --   --   ALBUMIN  --   --  1.1*  --   --   --  1.4* 1.5*   GFR: Estimated Creatinine Clearance: 52.3 mL/min (A) (by C-G formula based on SCr of 1.25 mg/dL (H)).  Liver Function Tests: Recent Labs  Lab 02/24/19 0207 02/26/19 0313 02/27/19 0018  AST 204* 107* 82*  ALT 122* 99* 80*  ALKPHOS 417* 378* 344*  BILITOT 1.1 1.1 1.4*  PROT 6.2* 6.9 7.2  ALBUMIN 1.1* 1.4* 1.5*   CBG: Recent Labs  Lab 02/26/19 1940 02/26/19 2028 02/27/19 0013 02/27/19 0514 02/27/19 0736  GLUCAP 69* 101* 93 178* 160*   App cct 30 min   Richardson Landry Minor ACNP Maryanna Shape PCCM Pager [phone] till 1 pm If no answer page [phone] 02/27/2019, 9:21 AM   PCCM attending:  78 year old gentleman initially admitted for sepsis, history of dementia, aspiration pneumonia E. coli bacteremia, status post 2 rounds of intubation mechanical ventilation.  Yesterday developed worsening acute hypoxemia requiring BiPAP.  Chest x-ray with bilateral pulmonary edema.  Patient was placed on NIPPV.  Given diuresis with Lasix and metolazone had good urine output overnight.  Discussions with family yesterday regarding goals of care.  Decision was made for DNR status.  Please see documentation by palliative care.  We appreciate their input.  BP 125/69   Pulse 95   Temp 99.1 F (37.3 C) (Axillary)   Resp (!) 30   Ht _0  (1.803 m)   Wt 73.5 kg   SpO2 98%   BMI 22.60 kg/m   General: Elderly male, resting in bed on nasal cannula. HEENT: Tracking around the room.  Does not speak that much but will mouth words Neck: Trachea midline Heart: Regular rate and rhythm, S1-S2  Lungs: Bilateral inspiratory crackles still present. Abdomen: Soft nontender nondistended  Labs from this morning pending, repeats reviewed. chest x-ray: Improved pulmonary edema The patient's images have been independently reviewed by me.    Assessment:  Acute hypoxemic respiratory failure Aspiration pneumonia, E. coli bacteremia, status post antimicrobial regimen Acute bilateral pulmonary edema Acute on chronic diastolic heart failure Volume overload, positive cumulative fluid balance. Sepsis resolved Concern for possible multiple myeloma versus metastatic disease of unknown primary. Dementia History of CVA Acute metabolic encephalopathy improved likely secondary to above  Plan:  Repeat labs pending this morning. Probable diuresis today. Wean FiO2 as tolerated to maintain SPO2 greater than 90%. Up out of bed to chair as much as tolerated Needs speech and PT/OT eval Discussed with hospitalist service.  Patient stable for transfer from the intensive care unit. If there is no decision for potential biopsy of unknown lesions within spine and pelvis believe the most appropriate decision would be for potential transition to home hospice  I have spoke with Dr. Sloan Leiter who will assume care 02/28/2019.   [facility] 02/27/2019 10:55 AM  Personal pager: [phone] If unanswered, please page CCM On-call: [phone]

## 2019-02-27 NOTE — Evaluation (Signed)
Occupational Therapy Evaluation Patient Details Name: Maurice Stone MRN: [id number] DOB: 05/21/1941 Today's Date: 02/27/2019    History of Present Illness 78 year old male Jehovah's Witness with a history of stroke in 2002 and 2015 with R sided deficits, CAD, COPD, dementia, HTN, T2DM and GERD who presented to the ED after being found down at his residence for an unknown amount of time. Admitted 02/10/2019 for treatment of sepsis , DKA, and rhabdomyolysis.  Intubated on 7/24 due to increased oxygen demand, extubated 02/24/19   Clinical Impression   This 78 yo male admitted with above presents to acute OT with decreased command following, decreased verbalizations, decreased balance, decreased mobility, weakness on right side (pre morbid from CVA ) all affecting his safety and independence with basic ADLs. He will benefit from acute OT with follow up at SNF.    Follow Up Recommendations  SNF;Supervision/Assistance - 24 hour    Equipment Recommendations  Other (comment)(TBD at next venue)       Precautions / Restrictions Precautions Precautions: Fall Precaution Comments: NG tube, flexiseal Restrictions Weight Bearing Restrictions: No             ADL either performed or assessed with clinical judgement   ADL Overall ADL's : Needs assistance/impaired Eating/Feeding: Total assistance Eating/Feeding Details (indicate cue type and reason): pt on NG tube Grooming: Total assistance;Bed level   Upper Body Bathing: Total assistance;Bed level   Lower Body Bathing: Total assistance;Bed level   Upper Body Dressing : Total assistance;Bed level   Lower Body Dressing: Total assistance;Bed level                       Vision Baseline Vision/History: Wears glasses(pt shook his head yes) Wears Glasses: (unsure) Additional Comments: Pt does turn his head and eyes left and right to his name being called            Pertinent Vitals/Pain Pain Assessment: Faces Faces Pain Scale: No  hurt     Hand Dominance Right   Extremity/Trunk Assessment Upper Extremity Assessment Upper Extremity Assessment: RUE deficits/detail;LUE deficits/detail RUE Deficits / Details: strength grossly 1/5 RUE Coordination: decreased fine motor;decreased gross motor LUE Deficits / Details: Grossly 3/5           Communication Communication Communication: Expressive difficulties   Cognition Arousal/Alertness: Awake/alert Behavior During Therapy: WFL for tasks assessed/performed Overall Cognitive Status: Difficult to assess Area of Impairment: Following commands;Attention;Safety/judgement;Awareness;Problem solving                   Current Attention Level: Sustained   Following Commands: Follows one step commands inconsistently;Follows one step commands with increased time Safety/Judgement: Decreased awareness of safety;Decreased awareness of deficits Awareness: Intellectual Problem Solving: Slow processing;Decreased initiation;Difficulty sequencing;Requires verbal cues;Requires tactile cues General Comments: Pt able to follow commands 50% of time and often with delay              Home Living Family/patient expects to be discharged to:: Skilled nursing facility                                 Additional Comments: Per 02/14/19: Pt's daughter present during session, reports pt was living alone and was independent but not taking good care of his health, was noncompliant with medication management, drinking alcohol, and had a poor diet;      Prior Functioning/Environment Level of Independence: Independent with assistive device(s)  Comments: Per 02/14/19: pt's daughter reports pt was ambulating with RW and was independent with ADL although was not taking good care of himself        OT Problem List: Decreased strength;Decreased range of motion;Impaired balance (sitting and/or standing);Impaired UE functional use;Decreased cognition      OT  Treatment/Interventions: Self-care/ADL training;DME and/or AE instruction;Balance training;Patient/family education;Therapeutic activities    OT Goals(Current goals can be found in the care plan section) Acute Rehab OT Goals OT Goal Formulation: With patient Time For Goal Achievement: 03/13/19 Potential to Achieve Goals: Fair  OT Frequency: Min 2X/week              AM-PAC OT "6 Clicks" Daily Activity     Outcome Measure Help from another person eating meals?: Total Help from another person taking care of personal grooming?: Total Help from another person toileting, which includes using toliet, bedpan, or urinal?: Total Help from another person bathing (including washing, rinsing, drying)?: Total Help from another person to put on and taking off regular upper body clothing?: Total Help from another person to put on and taking off regular lower body clothing?: Total 6 Click Score: 6   End of Session Nurse Communication: (how pt responded to OT)  Activity Tolerance: Patient tolerated treatment well Patient left: in bed;with bed alarm set  OT Visit Diagnosis: Other abnormalities of gait and mobility (R26.89);Muscle weakness (generalized) (M62.81);Hemiplegia and hemiparesis;Other symptoms and signs involving cognitive function;Cognitive communication deficit (R41.841) Symptoms and signs involving cognitive functions: Cerebral infarction(premorbid) Hemiplegia - Right/Left: Right Hemiplegia - dominant/non-dominant: Dominant Hemiplegia - caused by: Cerebral infarction(pre-morbid)                Time: 4580-9983 OT Time Calculation (min): 12 min Charges:  OT General Charges $OT Visit: 1 Visit OT Evaluation $OT Eval Moderate Complexity: 1 Mod  [address], OTR/L Acute [employer] Pager [phone] Office [phone]     Almon Register 02/27/2019, 11:19 AM

## 2019-02-27 NOTE — Progress Notes (Signed)
Physical Therapy Wound Treatment Patient Details  Name: YARIEL FERRARIS MRN: [id number] Date of Birth: [birth year]  Today's Date: 02/27/2019 Time: 5597-4163 Time Calculation (min): 58 min  Subjective  Subjective: Alert and agreeable. Nodded head "yes" to hydrotherapy.  Patient and Family Stated Goals: None stated Date of Onset: (Unsure)  Pain Score:  Appeared to tolerate well during treatment. No apparent distress throughout.   Wound Assessment  Pressure Injury 02/23/19 Sacrum Mid Unstageable - Full thickness tissue loss in which the base of the ulcer is covered by slough (yellow, tan, gray, green or brown) and/or eschar (tan, brown or black) in the wound bed. (Active)  Dressing Type ABD;Barrier Film (skin prep);Gauze (Comment);Moist to dry 02/27/19 1133  Dressing Changed;Clean;Dry;Intact 02/27/19 1133  Dressing Change Frequency Daily 02/27/19 1133  State of Healing Eschar 02/27/19 1133  Site / Wound Assessment Pink;Yellow;Black;Brown 02/27/19 1133  % Wound base Red or Granulating 10% 02/27/19 1133  % Wound base Yellow/Fibrinous Exudate 80% 02/27/19 1133  % Wound base Black/Eschar 10% 02/27/19 1133  Peri-wound Assessment Intact 02/27/19 1133  Wound Length (cm) 10 cm 02/23/19 1200  Wound Width (cm) 11.5 cm 02/23/19 1200  Wound Depth (cm) 0.1 cm 02/23/19 1200  Wound Surface Area (cm^2) 115 cm^2 02/23/19 1200  Wound Volume (cm^3) 11.5 cm^3 02/23/19 1200  Margins Unattached edges (unapproximated) 02/27/19 1133  Drainage Amount Moderate 02/27/19 1133  Drainage Description Serosanguineous 02/27/19 1133  Treatment Debridement (Selective);Hydrotherapy (Pulse lavage);Packing (Saline gauze) 02/27/19 1133   Santyl applied to wound bed prior to applying dressing.     Hydrotherapy Pulsed lavage therapy - wound location: Sacrum Pulsed Lavage with Suction (psi): 12 psi Pulsed Lavage with Suction - Normal Saline Used: 1000 mL Pulsed Lavage Tip: Tip with splash shield Selective  Debridement Selective Debridement - Location: Sacrum Selective Debridement - Tools Used: Forceps;Scalpel Selective Debridement - Tissue Removed: Black eschar   Wound Assessment and Plan  Wound Therapy - Assess/Plan/Recommendations Wound Therapy - Clinical Statement: Vitals more stable today and pt tolerated well. Noted inferior aspect of wound bled easier than superior aspect. This patient will continue to benefit from hydrotherapy for selective removal of unviable tissue, decrease bioburden and promote wound bed healing.  Wound Therapy - Functional Problem List: Global weakness in the setting of critical illness.  Factors Delaying/Impairing Wound Healing: Diabetes Mellitus;Infection - systemic/local;Immobility;Multiple medical problems;Polypharmacy Hydrotherapy Plan: Debridement;Dressing change;Patient/family education;Pulsatile lavage with suction Wound Therapy - Frequency: 6X / week Wound Therapy - Follow Up Recommendations: Other (comment)(LTACH) Wound Plan: See above  Wound Therapy Goals- Improve the function of patient's integumentary system by progressing the wound(s) through the phases of wound healing (inflammation - proliferation - remodeling) by: Decrease Necrotic Tissue to: 20% Decrease Necrotic Tissue - Progress: Progressing toward goal Increase Granulation Tissue to: 80% Increase Granulation Tissue - Progress: Progressing toward goal Improve Drainage Characteristics: Min;Serous Improve Drainage Characteristics - Progress: Progressing toward goal Goals/treatment plan/discharge plan were made with and agreed upon by patient/family: Yes Time For Goal Achievement: 7 days Wound Therapy - Potential for Goals: Good  Goals will be updated until maximal potential achieved or discharge criteria met.  Discharge criteria: when goals achieved, discharge from hospital, MD decision/surgical intervention, no progress towards goals, refusal/missing three consecutive treatments without  notification or medical reason.  GP     Thelma Comp 02/27/2019, 11:38 AM   [name], PT, DPT Acute Rehabilitation Services Pager: [phone] Office: [phone]

## 2019-02-27 NOTE — Progress Notes (Signed)
Spoke w/ xray to f/u on order for DG thoracic spine one view. Per xray, pt is on list and they should be by at some point today.

## 2019-02-28 ENCOUNTER — Inpatient Hospital Stay (HOSPITAL_COMMUNITY): Payer: Medicare Other

## 2019-02-28 LAB — CBC WITH DIFFERENTIAL/PLATELET
Abs Immature Granulocytes: 0.07 10*3/uL (ref 0.00–0.07)
Basophils Absolute: 0.1 10*3/uL (ref 0.0–0.1)
Basophils Relative: 1 %
Eosinophils Absolute: 0.2 10*3/uL (ref 0.0–0.5)
Eosinophils Relative: 2 %
HCT: 26.5 % — ABNORMAL LOW (ref 39.0–52.0)
Hemoglobin: 8 g/dL — ABNORMAL LOW (ref 13.0–17.0)
Immature Granulocytes: 1 %
Lymphocytes Relative: 16 %
Lymphs Abs: 1.6 10*3/uL (ref 0.7–4.0)
MCH: 27.9 pg (ref 26.0–34.0)
MCHC: 30.2 g/dL (ref 30.0–36.0)
MCV: 92.3 fL (ref 80.0–100.0)
Monocytes Absolute: 0.8 10*3/uL (ref 0.1–1.0)
Monocytes Relative: 8 %
Neutro Abs: 7 10*3/uL (ref 1.7–7.7)
Neutrophils Relative %: 72 %
Platelets: 350 10*3/uL (ref 150–400)
RBC: 2.87 MIL/uL — ABNORMAL LOW (ref 4.22–5.81)
RDW: 17 % — ABNORMAL HIGH (ref 11.5–15.5)
WBC: 9.7 10*3/uL (ref 4.0–10.5)
nRBC: 2.6 % — ABNORMAL HIGH (ref 0.0–0.2)

## 2019-02-28 LAB — GLUCOSE, CAPILLARY
Glucose-Capillary: 119 mg/dL — ABNORMAL HIGH (ref 70–99)
Glucose-Capillary: 156 mg/dL — ABNORMAL HIGH (ref 70–99)
Glucose-Capillary: 157 mg/dL — ABNORMAL HIGH (ref 70–99)
Glucose-Capillary: 191 mg/dL — ABNORMAL HIGH (ref 70–99)
Glucose-Capillary: 196 mg/dL — ABNORMAL HIGH (ref 70–99)
Glucose-Capillary: 250 mg/dL — ABNORMAL HIGH (ref 70–99)

## 2019-02-28 LAB — MAGNESIUM: Magnesium: 1.8 mg/dL (ref 1.7–2.4)

## 2019-02-28 LAB — PHOSPHORUS: Phosphorus: 3.8 mg/dL (ref 2.5–4.6)

## 2019-02-28 NOTE — TOC Progression Note (Signed)
Transition of Care [facility]) - Progression Note    Patient Details  Name: Maurice Stone MRN: [id number] Date of Birth: 1940-08-11  Transition of [facility]) CM/SW Contact  Maryclare Labrador, RN Phone Number: 02/28/2019, 8:44 AM  Clinical Narrative:   Pt is currently in ICU - awaiting transfer to lower level  unit.   Pt currently requiring HFNC, has copious secretions requiring nasotrach suctioning, failed swallow study performed 8/4 - therefore still has NG tube in place.  Pt is not yet appropriate for SNF process to begin.  TOC department will continue to follow          Expected Discharge Plan and Services                                                 Social Determinants of Health (SDOH) Interventions    Readmission Risk Interventions No flowsheet data found.

## 2019-02-28 NOTE — Progress Notes (Signed)
Physical Therapy Wound Treatment Patient Details  Name: Maurice Stone MRN: [id number] Date of Birth: 02-05-1941  Today's Date: 02/28/2019 Time: 1010-1100 Time Calculation (min): 50 min  Subjective  Subjective: Alert and agreeable. Nodded head "yes" to hydrotherapy.  Patient and Family Stated Goals: None stated Date of Onset: (Unsure)  Pain Score:  Tolerated well  Wound Assessment  Pressure Injury 02/23/19 Sacrum Mid Unstageable - Full thickness tissue loss in which the base of the ulcer is covered by slough (yellow, tan, gray, green or brown) and/or eschar (tan, brown or black) in the wound bed. (Active)  Dressing Type Barrier Film (skin prep);Gauze (Comment);Moist to dry;ABD 02/28/19 1324  Dressing Clean;Intact;Dry 02/28/19 1324  Dressing Change Frequency Daily 02/28/19 1324  State of Healing Non-healing 02/28/19 1324  Site / Wound Assessment Pink;Yellow;Black 02/28/19 1324  % Wound base Red or Granulating 10% 02/28/19 1324  % Wound base Yellow/Fibrinous Exudate 80% 02/28/19 1324  % Wound base Black/Eschar 10% 02/28/19 1324  Peri-wound Assessment Intact 02/28/19 1324  Wound Length (cm) 10 cm 02/23/19 1200  Wound Width (cm) 11.5 cm 02/23/19 1200  Wound Depth (cm) 0.1 cm 02/23/19 1200  Wound Surface Area (cm^2) 115 cm^2 02/23/19 1200  Wound Volume (cm^3) 11.5 cm^3 02/23/19 1200  Margins Unattached edges (unapproximated) 02/28/19 1324  Drainage Amount Moderate 02/28/19 1324  Drainage Description Serosanguineous 02/28/19 1324  Treatment Debridement (Selective);Hydrotherapy (Pulse lavage);Packing (Saline gauze) 02/28/19 1324   Santyl applied to wound bed prior to applying dressing.     Hydrotherapy Pulsed lavage therapy - wound location: Sacrum Pulsed Lavage with Suction (psi): 12 psi Pulsed Lavage with Suction - Normal Saline Used: 1000 mL Pulsed Lavage Tip: Tip with splash shield Selective Debridement Selective Debridement - Location: Sacrum Selective Debridement - Tools  Used: Forceps;Scalpel Selective Debridement - Tissue Removed: Black eschar   Wound Assessment and Plan  Wound Therapy - Assess/Plan/Recommendations Wound Therapy - Clinical Statement: Progressing with debridement. This patient will continue to benefit from hydrotherapy for selective removal of unviable tissue, decrease bioburden and promote wound bed healing.  Wound Therapy - Functional Problem List: Global weakness in the setting of critical illness.  Factors Delaying/Impairing Wound Healing: Diabetes Mellitus;Infection - systemic/local;Immobility;Multiple medical problems;Polypharmacy Hydrotherapy Plan: Debridement;Dressing change;Patient/family education;Pulsatile lavage with suction Wound Therapy - Frequency: 6X / week Wound Therapy - Follow Up Recommendations: Other (comment)(LTACH) Wound Plan: See above  Wound Therapy Goals- Improve the function of patient's integumentary system by progressing the wound(s) through the phases of wound healing (inflammation - proliferation - remodeling) by: Decrease Necrotic Tissue to: 20% Decrease Necrotic Tissue - Progress: Progressing toward goal Increase Granulation Tissue to: 80% Increase Granulation Tissue - Progress: Progressing toward goal Improve Drainage Characteristics: Min;Serous Goals/treatment plan/discharge plan were made with and agreed upon by patient/family: Yes Time For Goal Achievement: 7 days Wound Therapy - Potential for Goals: Good  Goals will be updated until maximal potential achieved or discharge criteria met.  Discharge criteria: when goals achieved, discharge from hospital, MD decision/surgical intervention, no progress towards goals, refusal/missing three consecutive treatments without notification or medical reason.  GP     Thelma Comp 02/28/2019, 1:55 PM   [name], PT, DPT Acute Rehabilitation Services Pager: [phone] Office: [phone]

## 2019-02-28 NOTE — Progress Notes (Signed)
Physical Therapy Treatment Patient Details Name: Maurice Stone MRN: [id number] DOB: March 02, 1941 Today's Date: 02/28/2019    History of Present Illness 78 year old male Jehovah's Witness with a history of stroke in 2002 and 2015 with R sided deficits, CAD, COPD, dementia, HTN, T2DM and GERD who presented to the ED after being found down at his residence for an unknown amount of time. Admitted 02/02/2019 for treatment of sepsis , DKA, and rhabdomyolysis.  Intubated on 7/24 due to increased oxygen demand, extubated 02/24/19    PT Comments    Pt was able to sit EOB ~10 mins with max to total assist for sitting balance and transitions to EOB.  He seemed to smile to music being played while we were sitting working on midline sitting balance and upright posture.  Pt's VSS throughout.  PT will continue to follow acutely for safe mobility progression.  Goals downgraded.    Follow Up Recommendations  SNF     Equipment Recommendations  Wheelchair (measurements PT);Wheelchair cushion (measurements PT);Hospital bed;Other (comment)(hoyer lift)    Recommendations for Other Services   Na     Precautions / Restrictions Precautions Precautions: Fall Precaution Comments: NG tube, flexiseal    Mobility  Bed Mobility Overal bed mobility: Needs Assistance Bed Mobility: Rolling;Supine to Sit;Sit to Supine Rolling: Total assist   Supine to sit: Total assist Sit to supine: Total assist   General bed mobility comments: Assist of trunk and legs with HOB maximally elevated to come to sitting EOB.  Pt unable to initiate movement, but did not resist movement to EOB.    Transfers                 General transfer comment: Unable, would need total lift for safe OOB mobility.          Balance Overall balance assessment: Needs assistance Sitting-balance support: Feet supported;Single extremity supported Sitting balance-Leahy Scale: Zero Sitting balance - Comments: max assist EOB, pt attempting to  hold himself up with his Left hand, however, ultimately pushing himself forward and to the right.  Therapist released hand and positioned on the right supporting trunk and facilitating upright head posture.  Music played while sitting and pt smiled and seemed to respond positively to music (jazz and [employer]).  Postural control: Right lateral lean(and forward)                                  Cognition Arousal/Alertness: Awake/alert Behavior During Therapy: WFL for tasks assessed/performed Overall Cognitive Status: Impaired/Different from baseline Area of Impairment: Attention;Following commands;Safety/judgement;Awareness;Problem solving                   Current Attention Level: Focused   Following Commands: Follows one step commands inconsistently;Follows one step commands with increased time Safety/Judgement: Decreased awareness of safety;Decreased awareness of deficits Awareness: Intellectual Problem Solving: Slow processing;Decreased initiation;Difficulty sequencing;Requires verbal cues;Requires tactile cues General Comments: Pt did not follow any commands for me today, alert, not resistant to mobility.  Seemed to respond well to music during the session.  Per RN, he may do better in the AM as he gets fatigued and less responsive in the PMs for her.       Exercises General Exercises - Lower Extremity Ankle Circles/Pumps: PROM;Both;10 reps Heel Slides: PROM;Both;10 reps Hip ABduction/ADduction: PROM;Both;10 reps    General Comments General comments (skin integrity, edema, etc.): VSS throughout session.  RN in room in and  out providing oral care, suction as pt with productive cough, but not strong enough to expectorate the phlem.        Pertinent Vitals/Pain Pain Assessment: Faces Faces Pain Scale: Hurts little more Pain Location: with R side movement. Pain Descriptors / Indicators: Grimacing Pain Intervention(s): Monitored during session;Limited  activity within patient's tolerance;Repositioned       Prior Function            PT Goals (current goals can now be found in the care plan section) Acute Rehab PT Goals Patient Stated Goal: unable to state Progress towards PT goals: Progressing toward goals    Frequency    Min 2X/week      PT Plan Current plan remains appropriate       AM-PAC PT "6 Clicks" Mobility   Outcome Measure  Help needed turning from your back to your side while in a flat bed without using bedrails?: Total Help needed moving from lying on your back to sitting on the side of a flat bed without using bedrails?: Total Help needed moving to and from a bed to a chair (including a wheelchair)?: Total Help needed standing up from a chair using your arms (e.g., wheelchair or bedside chair)?: Total Help needed to walk in hospital room?: Total Help needed climbing 3-5 steps with a railing? : Total 6 Click Score: 6    End of Session Equipment Utilized During Treatment: Oxygen Activity Tolerance: Patient limited by fatigue Patient left: in bed;with call bell/phone within reach;with bed alarm set Nurse Communication: Mobility status PT Visit Diagnosis: Other abnormalities of gait and mobility (R26.89);Muscle weakness (generalized) (M62.81);Difficulty in walking, not elsewhere classified (R26.2);History of falling (Z91.81);Other symptoms and signs involving the nervous system (R29.898);Hemiplegia and hemiparesis;Pain Hemiplegia - Right/Left: Right Hemiplegia - dominant/non-dominant: Dominant Hemiplegia - caused by: Unspecified Pain - Right/Left: Right Pain - part of body: (arm and leg)     Time: 9563-8756 PT Time Calculation (min) (ACUTE ONLY): 26 min  Charges:  $Therapeutic Activity: 8-22 mins $Neuromuscular Re-education: 8-22 mins                    Weslynn Ke B. Delanee Xin, PT, DPT  Acute Rehabilitation [phone] pager [phone] office  @ [facility]:  [phone]   02/28/2019, 5:45 PM

## 2019-02-28 NOTE — Progress Notes (Signed)
Nutrition Follow-up  DOCUMENTATION CODES:   Severe malnutrition in context of chronic illness  INTERVENTION:   Continue TF via Cortrak tube:  Vital AF 1.2 at 70 ml/h  Provides 2016 kcal, 126 gm protein, 1362 ml free water daily  NUTRITION DIAGNOSIS:   Severe Malnutrition related to chronic illness(CVA, dementia) as evidenced by severe muscle depletion, severe fat depletion.  Ongoing   GOAL:   Patient will meet greater than or equal to 90% of their needs  Met with TF  MONITOR:   TF tolerance, Skin, I & O's, Labs, Diet advancement  ASSESSMENT:   Maurice Stone is a 78 year old male Jehovah's Witness with a history of stroke in 2002 and 2015 with R sided deficits, CAD, COPD, dementia, HTN, T2DM and GERD who presented to the ED after being found down at his residence for an unknown amount of time. Per the ED note, he has not been seen for the past 3 days. A friend found him on the ground only wearing his boxer briefs and was unresponsive. The friend who brought him in says that he is usually fully alert and oriented at his baseline.  Palliative care team is following. Patient is now DNR, continuing to provide current level of care with hopes of improvement.  Cortrak in place, tip in stomach. Receiving Vital AF 1.2 at 70 ml/h, tolerating well. No longer requiring BiPAP, now on 6 L HFNC.  S/P SLP swallow evaluation 8/4. Patient remains NPO due to high aspiration risk.   Labs reviewed. Potassium, phosphorus, and magnesium are WNL CBG's: [phone]  Medications reviewed and include vitamin C, ferrous sulfate, novolog, levemir.   Diet Order:   Diet Order            Diet NPO time specified  Diet effective now              EDUCATION NEEDS:   No education needs have been identified at this time  Skin:   DTI: back Stage I: left heel Stage II: coccyx Unstageable: sacrum Other: MASD to scrotum  Last BM:  8/5 (type 7, rectal tube)  Height:   Ht Readings from Last  1 Encounters:  02/22/2019 _0  (1.803 m)    Weight:   Wt Readings from Last 1 Encounters:  02/28/19 70.2 kg    Ideal Body Weight:  78.2 kg  BMI:  Body mass index is 21.59 kg/m.  Estimated Nutritional Needs:   Kcal:  2000-2200  Protein:  115-130 gm  Fluid:  >/= 2 L    Molli Barrows, RD, LDN, [place] Pager [phone] After Hours Pager [phone]

## 2019-02-28 NOTE — Progress Notes (Signed)
Spoke with Dr. Valeta Harms this morning. Patient is stable for discharge from ICU and was accidentally transferred to [facility]. Dr. Valeta Harms will see patient today and IMTS will assume care at 7am on 8/6.  Pearson Grippe, DO IM PGY-3

## 2019-03-01 ENCOUNTER — Inpatient Hospital Stay (HOSPITAL_COMMUNITY): Payer: Medicare Other

## 2019-03-01 DIAGNOSIS — B9620 Unspecified Escherichia coli [E. coli] as the cause of diseases classified elsewhere: Secondary | ICD-10-CM

## 2019-03-01 DIAGNOSIS — R7881 Bacteremia: Secondary | ICD-10-CM

## 2019-03-01 DIAGNOSIS — E46 Unspecified protein-calorie malnutrition: Secondary | ICD-10-CM

## 2019-03-01 DIAGNOSIS — K117 Disturbances of salivary secretion: Secondary | ICD-10-CM

## 2019-03-01 DIAGNOSIS — Z79899 Other long term (current) drug therapy: Secondary | ICD-10-CM

## 2019-03-01 DIAGNOSIS — J189 Pneumonia, unspecified organism: Secondary | ICD-10-CM

## 2019-03-01 DIAGNOSIS — E119 Type 2 diabetes mellitus without complications: Secondary | ICD-10-CM

## 2019-03-01 DIAGNOSIS — Z794 Long term (current) use of insulin: Secondary | ICD-10-CM

## 2019-03-01 DIAGNOSIS — E785 Hyperlipidemia, unspecified: Secondary | ICD-10-CM

## 2019-03-01 DIAGNOSIS — F0390 Unspecified dementia without behavioral disturbance: Secondary | ICD-10-CM

## 2019-03-01 DIAGNOSIS — D696 Thrombocytopenia, unspecified: Secondary | ICD-10-CM

## 2019-03-01 DIAGNOSIS — D649 Anemia, unspecified: Secondary | ICD-10-CM

## 2019-03-01 LAB — CBC
HCT: 26.3 % — ABNORMAL LOW (ref 39.0–52.0)
Hemoglobin: 7.7 g/dL — ABNORMAL LOW (ref 13.0–17.0)
MCH: 27.9 pg (ref 26.0–34.0)
MCHC: 29.3 g/dL — ABNORMAL LOW (ref 30.0–36.0)
MCV: 95.3 fL (ref 80.0–100.0)
Platelets: 352 10*3/uL (ref 150–400)
RBC: 2.76 MIL/uL — ABNORMAL LOW (ref 4.22–5.81)
RDW: 17.2 % — ABNORMAL HIGH (ref 11.5–15.5)
WBC: 10.5 10*3/uL (ref 4.0–10.5)
nRBC: 1.4 % — ABNORMAL HIGH (ref 0.0–0.2)

## 2019-03-01 LAB — BASIC METABOLIC PANEL
Anion gap: 13 (ref 5–15)
BUN: 35 mg/dL — ABNORMAL HIGH (ref 8–23)
CO2: 30 mmol/L (ref 22–32)
Calcium: 8.2 mg/dL — ABNORMAL LOW (ref 8.9–10.3)
Chloride: 100 mmol/L (ref 98–111)
Creatinine, Ser: 1.17 mg/dL (ref 0.61–1.24)
GFR calc Af Amer: >60 mL/min (ref >60)
GFR calc non Af Amer: >60 mL/min (ref >60)
Glucose, Bld: 344 mg/dL — ABNORMAL HIGH (ref 70–99)
Potassium: 3.5 mmol/L (ref 3.5–5.1)
Sodium: 143 mmol/L (ref 135–145)

## 2019-03-01 LAB — GLUCOSE, CAPILLARY
Glucose-Capillary: 246 mg/dL — ABNORMAL HIGH (ref 70–99)
Glucose-Capillary: 285 mg/dL — ABNORMAL HIGH (ref 70–99)
Glucose-Capillary: 305 mg/dL — ABNORMAL HIGH (ref 70–99)
Glucose-Capillary: 275 mg/dL — ABNORMAL HIGH (ref 70–99)
Glucose-Capillary: 255 mg/dL — ABNORMAL HIGH (ref 70–99)

## 2019-03-01 MED ORDER — METOPROLOL TARTRATE 5 MG/5ML IV SOLN
2.5000 mg | Freq: Once | INTRAVENOUS | Status: AC
  Administered 2019-03-01: 2.5 mg via INTRAVENOUS
  Filled 2019-03-01: qty 5

## 2019-03-01 MED ORDER — INSULIN DETEMIR 100 UNIT/ML ~~LOC~~ SOLN
30.0000 [IU] | Freq: Every day | SUBCUTANEOUS | Status: DC
  Administered 2019-03-02: 30 [IU] via SUBCUTANEOUS
  Filled 2019-03-01: qty 0.3

## 2019-03-01 MED ORDER — MORPHINE SULFATE (CONCENTRATE) 10 MG/0.5ML PO SOLN
5.0000 mg | ORAL | Status: DC | PRN
  Administered 2019-03-01 – 2019-03-02 (×3): 10 mg
  Filled 2019-03-01 (×3): qty 0.5

## 2019-03-01 MED ORDER — INSULIN ASPART 100 UNIT/ML ~~LOC~~ SOLN
10.0000 [IU] | SUBCUTANEOUS | Status: DC
  Administered 2019-03-02 (×2): 10 [IU] via SUBCUTANEOUS

## 2019-03-01 MED ORDER — GLYCOPYRROLATE 0.2 MG/ML IJ SOLN
0.1000 mg | Freq: Two times a day (BID) | INTRAMUSCULAR | Status: DC
  Administered 2019-03-01 – 2019-03-03 (×5): 0.1 mg via INTRAVENOUS
  Filled 2019-03-01 (×5): qty 1

## 2019-03-01 MED ORDER — ENOXAPARIN SODIUM 40 MG/0.4ML ~~LOC~~ SOLN
40.0000 mg | SUBCUTANEOUS | Status: DC
  Administered 2019-03-01: 40 mg via SUBCUTANEOUS
  Filled 2019-03-01: qty 0.4

## 2019-03-01 MED ORDER — SCOPOLAMINE 1 MG/3DAYS TD PT72
1.0000 | MEDICATED_PATCH | TRANSDERMAL | Status: DC
  Administered 2019-03-01 – 2019-03-04 (×2): 1.5 mg via TRANSDERMAL
  Filled 2019-03-01 (×2): qty 1

## 2019-03-01 MED ORDER — GLYCOPYRROLATE 1 MG PO TABS
1.0000 mg | ORAL_TABLET | Freq: Two times a day (BID) | ORAL | Status: DC
  Administered 2019-03-01: 1 mg
  Filled 2019-03-01 (×7): qty 1

## 2019-03-01 MED ORDER — INSULIN ASPART 100 UNIT/ML ~~LOC~~ SOLN
8.0000 [IU] | SUBCUTANEOUS | Status: DC
  Administered 2019-03-01 (×2): 8 [IU] via SUBCUTANEOUS

## 2019-03-01 NOTE — Progress Notes (Signed)
Transfer [place] Patient initially admitted on 7/19 with Encephalopathy, DKA, Sepsis from E. Coli Bacteremia from a urinary source, AKI, and Rhabdomyolysis. He was admitted to the ICU on 7/20 due to hypotension from septic shock; he was treated with cefepime. He was transferred out of the ICU on 7/22 after improvement in BP, but remain encephalopathic and was transferred back to ICU on 7/24 due to increasing O2 requirements from a new pneumonia and inability to protect his airway (Also noted on CT scan from 7/24 were osseous lucency concerning for MM vs Mets). Patient was intubated on 7/24 and continued on Cefepime. He failed extubation trial on 7/26 and was re-intubated that day. Patient was seen by oncology for likely MM, bone marrow biopsy was considered, but deferred as goal of care were addressed by palliative care. Family chose to wait and see how he tolerated extubation, but wished to continue with aggressive measures. He was extubated on 8/1 and has remained extubated. He did require BiPAP on 8/3 believed to be due to volume overload and exacerabtion of diastolic heart failure. He was treated with IV diuretics and showed improvement. Further discussion were had with the patient's son, [name] (and he had conversations with his sisters), and patient was made DNR given his multiple comorbidities and low likelihood of tolerated extubation if intubated a 3rd time this admission; Family continues to want continued medical management and remain hopeful for improvement.   Subjective:  Patient seen at beside this AM. He appears weak and chronically ill. He is able to speak but his voice is weak and he is speak through secretions. He is alert and oriented to person and  Place (unable to discern his response to year). He denies pain and asks for some water. We discussed we would have to wait for clearance from SLP, but he could have his mouth moistened with a sponge. Remains very weak with weak cough and I  suctioned some secretion for him, but he will need further suctioning and monitoring.  Objective:  Vital signs in last 24 hours: Vitals:   03/01/19 0112 03/01/19 0129 03/01/19 0200 03/01/19 0256  BP:  125/68 126/75 129/70  Pulse: 93 90 94 92  Resp: 16 (!) 23 (!) 21 (!) 25  Temp:    97.8 F (36.6 C)  TempSrc:    Oral  SpO2: (!) 89% 99% 99% 100%  Weight:      Height:       Physical Exam Constitutional:      General: He is not in acute distress.    Appearance: He is ill-appearing.     Comments: Thin, chronically ill-appearing, elderly male  Cardiovascular:     Rate and Rhythm: Regular rhythm. Tachycardia present.     Pulses: Normal pulses.  Pulmonary:     Breath sounds: Rhonchi present.     Comments: Mild respiratory distress, on 9L [place] Abdominal:     General: Abdomen is flat. Bowel sounds are normal.     Palpations: Abdomen is soft.     Tenderness: There is no abdominal tenderness.  Musculoskeletal:        General: No swelling or tenderness.     Right lower leg: No edema.     Left lower leg: No edema.  Skin:    General: Skin is warm and dry.  Neurological:     Mental Status: He is alert.     Comments: Alert and oriented x2 (person and place) Unable to discern some of his responses  Assessment/Plan:  Active Problems:   Sepsis ([place])   DKA (diabetic ketoacidosis) ([facility])   Altered mental status   Protein-calorie malnutrition, severe   Pressure injury of skin   Goals of care, counseling/discussion   Palliative care by specialist   Endotracheally intubated   Respiratory failure ([place])   Ventilator dependent ([place])   Multiple myeloma not having achieved remission ([place])   Encounter for feeding tube placement   Palliative care encounter   Acute hypoxemic respiratory failure ([place])   DNR (do not resuscitate)   Aspiration pneumonia ([place])  Acute Hypoxic Respiratory Failure: Initially due to PNA (likely from aspiration given his encephalopathy surrounding this event).  Extubated on 8/1, but required BiPAP on 8/3. Found to be vol overloaded and has improved with diureses in the ICU. He does have a history of COPD as well. Has competed Abx course. Still with secretiona and rhochorous breath sounds this AM. - Continue supplemental O2, wean as tolerated - Volume status now improving, Lasix discontinued - Strict I/O, Daily weights - Duonebs PRN  Concern for Multiple Myeloma: Multiple spine and pelvic bone lesion seen on initial imaging. Metastatic disease not ruled out, but MM suspected. M-protein 0.7 on SPEP, and Bence Jones protein in urine,Kappa type, urine M-protein 1.1g/24h.  - Oncology consulted in ED - Bone biopsy on hold given his clinic status and comorbidities  AKI Hypernatremia Hypokalemia (resolved)  Hypophosphatemia (resolved) Initial AKI the settign of DKA and being found down had resolved with Cr 1.18 3 days. Did have Cr bump 2 days ago to ~1.3; Na 147 2d ago. - Repeat BMP - Continue free water - Follow electrolytes daily replace as needed  Anemia Thrombocytopenia Plantlets stable. Hgb has down trended since admission. Stable at 7-8 past few days. Of, note while patient is Jehovah's Witness, he is not actively practicing per family and would accept blood products under extreme circumstances. - Hgb stable at 7.7 today.  - Minimize phlebotomy - Multivitamin and iron supplementation  Pneumonia  E-Coli Bacteremia from urinary source: Completed 7 days of cefepime on 8/1 - Continue to monitor  HLD  CVA Dementia Acute metabolic encephalopathy: Today he was alert and oriented x2 as above. Was living at home and ?functional at baseline. He does have a history of CVA and dementia. Of note, has resufused assisted living in the past. - Continue home Plavix (had stroke on ASA alone) - Continue home Aricept. - Home Lipitor  HTN  - Continue Norvasc Cozaar and Lopressor  DM: DKA on admission has resolved. Currently on tube feeds. -  Levemir 25U Daily - NovoLog 5U q4h - SSI-M  Protein calorie malnutrition - Appreciate SLP recommendations - Continue tube feeds - Oral care  Dispo: Anticipated discharge pending clinical course.   Neva Seat, MD 03/01/2019, 6:58 AM Pager: [phone]

## 2019-03-01 NOTE — Progress Notes (Addendum)
Paged this evening by RN regarding patient's tachycardia (heart rate 120s to 130s) and respirations (30s).  Patient's chart reviewed.  Appears that heart rate has been slowly increasing over the day along with his respirations.  O2 requirements also increasing-- requiring 9 L today, 5 L yesterday.    Patient assessed at bedside by Dr. Frederico Hamman and myself.  Patient somnolent and does not awaken to voice.  This seems to be a change from his status earlier today per chart review.  Some bibasilar crackles present.  Patient continued to be tachycardic.  Telemetry monitor reviewed revealing PVCs.  Chart reviewed including the palliative care note from earlier today which I interpret as meaning that the son has not elected to place patient on comfort measures until he speaks to his sisters so as of right now patient is a DNR status.  With that being said, EKG, chest x-ray ordered.  2.5 mg metoprolol ordered.  Further management will be guided by results.  Addendum 1: CXR actually appears to be slightly improved from prior on 8/5. No significant changes on EKG--sinus tachycardia and PVCs present. Will order 1mg  morphine IV.  Dr. Mitzi Hansen, MD Internal Medicine Resident- PGY-1

## 2019-03-01 NOTE — Progress Notes (Signed)
  Date: 03/01/2019  Patient name: Maurice Stone  Medical record number: [id number]  Date of birth: 21-Mar-1941   I have seen and evaluated this patient and I have discussed the plan of care with the house staff. Please see Dr. Tally Joe note for complete details. I concur with his findings and plan.    Sid Falcon, MD 03/01/2019, 12:10 PM

## 2019-03-01 NOTE — Progress Notes (Addendum)
Nutrition Follow-up  DOCUMENTATION CODES:   Severe malnutrition in context of chronic illness  INTERVENTION:   -Continue 250 mg ascorbic acid BID -Continue TF via Cortrak tube:  Vital AF 1.2 at 70 ml/h Provides 2016 kcal, 126 gm protein, 1362 ml free water daily  -MVI with minerals daily  NUTRITION DIAGNOSIS:   Severe Malnutrition related to chronic illness(CVA, dementia) as evidenced by severe muscle depletion, severe fat depletion.  Ongoing  GOAL:   Patient will meet greater than or equal to 90% of their needs  Progressing   MONITOR:   TF tolerance, Skin, I & O's, Labs, Diet advancement  REASON FOR ASSESSMENT:   Ventilator, Consult Enteral/tube feeding initiation and management  ASSESSMENT:   Mr. Nordquist is a 78 year old male Jehovah's Witness with a history of stroke in 2002 and 2015 with R sided deficits, CAD, COPD, dementia, HTN, T2DM and GERD who presented to the ED after being found down at his residence for an unknown amount of time. Per the ED note, he has not been seen for the past 3 days. A friend found him on the ground only wearing his boxer briefs and was unresponsive. The friend who brought him in says that he is usually fully alert and oriented at his baseline.  7/28- cortrak tube placed 8/1- rectal tube placed 8/4- s/p BSE- remains NPO due to high aspiration risk  Reviewed I/O's: +967 ml x 24 hours and +16.4 L since 02/15/19  UOP: 2.5 L x 24 hours  Rectal tube output: 75 ml x 24 hours  Pt preparing for hydrotherapy at time of visit. Per discussion with PT, pt tolerating treatments well.   Pt receiving Vital AF 1.2 @ 70 ml/hr via cortrak tube, which provides 2016 kcals, 126 grams protein, and 1362 ml free water daily, meeting 100% of estimated needs.   Palliative care team is following. Per discussion today, pt son considering transition to comfort measures later on today; already confirmed DNR/DNI with no trach. RD will continue with current TF  orders, however, if family desires aggressive care consider: Jevity 1.2 @ 60 ml/hr via cortrak tube; 30 ml Prostat BID; 300 ml free water flush every 4 hours per MD; and 1 packet Juven BID, each packet provides 95 calories, 2.5 grams of protein (collagen), and 9.8 grams of carbohydrate (3 grams sugar); also contains 7 grams of L-arginine and L-glutamine, 300 mg vitamin C, 15 mg vitamin E, 1.2 mcg vitamin B-12, 9.5 mg zinc, 200 mg calcium, and 1.5 g  Calcium Beta-hydroxy-Beta-methylbutyrate to support wound healing. This regimen would provide 2118 kcal (100% of needs), 115 grams of protein, and 2962 ml of H2O.   Labs reviewed: K WDL, CBGS: [id number] (inpatient orders for glycemic control are 0-15 units insulin aspart every 4 hours, 8 units insulin aspart every 4 hours, and 25 units insulin detemir daily).   Diet Order:   Diet Order            Diet NPO time specified  Diet effective now              EDUCATION NEEDS:   No education needs have been identified at this time  Skin:  Skin Assessment: Skin Integrity Issues: Skin Integrity Issues:: Unstageable DTI: back Stage I: left heel Stage II: coccyx Unstageable: sacrum Other: MASD to scrotum  Last BM:  03/01/19 (via rectal tube)  Height:   Ht Readings from Last 1 Encounters:  02/10/2019 5\' 11"  (1.803 m)    Weight:   Wt Readings from  Last 1 Encounters:  03/01/19 70.7 kg    Ideal Body Weight:  78.2 kg  BMI:  Body mass index is 21.74 kg/m.  Estimated Nutritional Needs:   Kcal:  2000-2200  Protein:  115-130 gm  Fluid:  >/= 2 L    Kore Madlock A. Jimmye Norman, RD, LDN, [place] Registered Dietitian II Certified Diabetes Care and Education Specialist Pager: [phone] After hours Pager: [phone]

## 2019-03-01 NOTE — Progress Notes (Signed)
Physical Therapy Wound Treatment Patient Details  Name: Maurice Stone MRN: [id number] Date of Birth: 1941-06-19  Today's Date: 03/01/2019 Time: 1130-1200 Time Calculation (min): 30 min  Subjective  Subjective: Alert and agreeable. Nodded head "yes" to hydrotherapy.  Patient and Family Stated Goals: None stated Date of Onset: (Unsure)  Pain Score:  Tolerated well - no indication of discomfort  Wound Assessment  Pressure Injury 02/23/19 Sacrum Mid Unstageable - Full thickness tissue loss in which the base of the ulcer is covered by slough (yellow, tan, gray, green or brown) and/or eschar (tan, brown or black) in the wound bed. (Active)  Dressing Type Barrier Film (skin prep);Gauze (Comment);Moist to dry;ABD 03/01/19 1400  Dressing Clean;Dry;Intact 03/01/19 1400  Dressing Change Frequency Daily 03/01/19 1400  State of Healing Eschar 03/01/19 1400  Site / Wound Assessment Pink;Yellow;Black 03/01/19 1400  % Wound base Red or Granulating 15% 03/01/19 1400  % Wound base Yellow/Fibrinous Exudate 75% 03/01/19 1400  % Wound base Black/Eschar 10% 03/01/19 1400  Peri-wound Assessment Intact 03/01/19 1400  Wound Length (cm) 10 cm 02/23/19 1200  Wound Width (cm) 11.5 cm 02/23/19 1200  Wound Depth (cm) 0.1 cm 02/23/19 1200  Wound Surface Area (cm^2) 115 cm^2 02/23/19 1200  Wound Volume (cm^3) 11.5 cm^3 02/23/19 1200  Margins Unattached edges (unapproximated) 03/01/19 1400  Drainage Amount Moderate 03/01/19 1400  Drainage Description Serosanguineous 03/01/19 1400  Treatment Debridement (Selective);Hydrotherapy (Pulse lavage);Packing (Saline gauze) 03/01/19 1400   Santyl applied to wound bed prior to applying dressing.     Hydrotherapy Pulsed lavage therapy - wound location: Sacrum Pulsed Lavage with Suction (psi): 12 psi Pulsed Lavage with Suction - Normal Saline Used: 1000 mL Pulsed Lavage Tip: Tip with splash shield Selective Debridement Selective Debridement - Location:  Sacrum Selective Debridement - Tools Used: Forceps;Scalpel Selective Debridement - Tissue Removed: Black eschar   Wound Assessment and Plan  Wound Therapy - Assess/Plan/Recommendations Wound Therapy - Clinical Statement: Progressing with debridement. Asked RN to order air mattress to protect skin integrity. This patient will continue to benefit from hydrotherapy for selective removal of unviable tissue, decrease bioburden and promote wound bed healing.  Wound Therapy - Functional Problem List: Global weakness in the setting of critical illness.  Factors Delaying/Impairing Wound Healing: Diabetes Mellitus;Infection - systemic/local;Immobility;Multiple medical problems;Polypharmacy Hydrotherapy Plan: Debridement;Dressing change;Patient/family education;Pulsatile lavage with suction Wound Therapy - Frequency: 6X / week Wound Therapy - Follow Up Recommendations: Other (comment)(LTACH) Wound Plan: See above  Wound Therapy Goals- Improve the function of patient's integumentary system by progressing the wound(s) through the phases of wound healing (inflammation - proliferation - remodeling) by: Decrease Necrotic Tissue to: 20% Decrease Necrotic Tissue - Progress: Progressing toward goal Increase Granulation Tissue to: 80% Increase Granulation Tissue - Progress: Progressing toward goal Improve Drainage Characteristics: Min;Serous Improve Drainage Characteristics - Progress: Progressing toward goal Goals/treatment plan/discharge plan were made with and agreed upon by patient/family: Yes Time For Goal Achievement: 7 days Wound Therapy - Potential for Goals: Good  Goals will be updated until maximal potential achieved or discharge criteria met.  Discharge criteria: when goals achieved, discharge from hospital, MD decision/surgical intervention, no progress towards goals, refusal/missing three consecutive treatments without notification or medical reason.  GP     Thelma Comp 03/01/2019, 2:49 PM    [name], PT, DPT Acute Rehabilitation Services Pager: [phone] Office: [phone]

## 2019-03-01 NOTE — Progress Notes (Signed)
Patient sustaining HR in 120's-low 130's. Patient does not seem to be in distress. Oxygen sat of 92% on 9L HFNC. MD made aware.

## 2019-03-01 NOTE — Progress Notes (Signed)
RT called to assess patient for NTS. RN stated that the family asked for it. When I arrived, patient on 9L [place], no distress noted. BBS slight rhonchi, and a weak cough. I know that patient had been suctioned before, and I asked him if he felt like he needed it. He nodded no. Patient has weak cough, but is trying. Spoke with RN. I feel like it would be more traumatic than needed at this time to NTS him, but I left supplies at bedside to use if status changes. RN to relay message to family.

## 2019-03-01 NOTE — Care Management Important Message (Signed)
Important Message  Patient Details  Name: Maurice Stone MRN: [id number] Date of Birth: June 16, 1941   Medicare Important Message Given:  Yes     Orbie Pyo 03/01/2019, 1:54 PM

## 2019-03-01 NOTE — Progress Notes (Signed)
Daily Progress Note   Patient Name: Maurice Stone       Date: 03/01/2019 DOB: 1941/02/22  Age: 78 y.o. MRN#: [id number] Attending Physician: Sid Falcon, MD Primary Care Physician: [facility] Admit Date: 01/27/2019  Reason for Consultation/Follow-up: Establishing goals of care  Subjective: Visited with patient x2 today. He will open eyes to voice and nod head yes/no to some questions, but drowsy and fatigued. Remains with copious secretions and weak cough. Attempted to discuss diagnoses and plan of care with patient but do not believe he grasps medical complexity of condition.  GOC:  F/u GOC with son, Maurice Stone in family conference room.   Discussed course of hospitalization including diagnoses, interventions, and plan of care. Reviewed test and lab results. Discussed underlying co-morbidities prior to admit. Discussed unfortunate poor prognosis with copious secretions and extremely high risk for aspiration leading to respiratory distress and death.   Maurice Stone confirmed family decision (with three sisters) for DNR/DNI and no trach. We discussed that his father did not pass SLP evaluation and would not be able to leave hospital with cortrak. Would need long-term feeding tube. Explained high risk for ongoing aspiration despite feeding tube, with secretions and weak cough.   Maurice Stone is very knowledgeable and realistic about his father's conditions and prognosis. He asks about hospice and if his father is eligible for hospice facility.   Explained transition to comfort focused care including discontinuation of interventions not aimed at comfort in order to allow comfort, peace, dignity at EOL. Emphasized role of comfort medications to relieve pain and suffering. Explained that yes, his  father is eligible for hospice facility placement if goals shift to comfort.  Discussed hospice philosophy and options.   Maurice Stone is leaning toward transition to comfort measures and hospice referral but wishes to further discuss with his three sisters this evening before making final decision. Maurice Stone is agreeable to start comfort medications and is very worried about his father's secretions. Discussed low dose morphine, scheduled robinul, and scopolamine patch.   Answered questions and concerns. Therapeutic listening as Maurice Stone shares his father's history as a [place] [employer] veteran. Maurice Stone has PMT contact information.   Length of Stay: 18  Current Medications: Scheduled Meds:  . ascorbic acid  250 mg Per Tube BID  . chlorhexidine  15 mL Mouth Rinse BID  .  Chlorhexidine Gluconate Cloth  6 each Topical Q0600  . clopidogrel  75 mg Per Tube Daily  . collagenase   Topical Daily  . donepezil  10 mg Oral QHS  . enoxaparin (LOVENOX) injection  40 mg Subcutaneous Q24H  . ferrous sulfate  300 mg Oral BID WC  . free water  300 mL Per Tube Q4H  . glycopyrrolate  1 mg Per Tube BID   Or  . glycopyrrolate  0.1 mg Intravenous BID  . insulin aspart  0-15 Units Subcutaneous Q4H  . insulin aspart  8 Units Subcutaneous Q4H  . insulin detemir  25 Units Subcutaneous Daily  . mouth rinse  15 mL Mouth Rinse q12n4p  . pantoprazole sodium  40 mg Per Tube Q24H  . sertraline  100 mg Per Tube QHS  . sodium chloride flush  10-40 mL Intracatheter Q12H  . sodium chloride flush  3 mL Intravenous Q12H    Continuous Infusions: . sodium chloride Stopped (03/01/19 0146)  . feeding supplement (VITAL AF 1.2 CAL) 1,000 mL (03/01/19 0536)    PRN Meds: sodium chloride, acetaminophen, ipratropium-albuterol, polyethylene glycol, sodium chloride flush  Physical Exam Vitals signs and nursing note reviewed.  Constitutional:      Appearance: He is cachectic. He is ill-appearing.  HENT:     Head: Normocephalic and  atraumatic.  Cardiovascular:     Rate and Rhythm: Tachycardia present.  Pulmonary:     Effort: No tachypnea, accessory muscle usage or respiratory distress.     Breath sounds: Rhonchi present.     Comments: Copious secretions. Weak cough. HFNC 9L  Abdominal:     Tenderness: There is no abdominal tenderness.  Skin:    General: Skin is warm and dry.  Neurological:     Mental Status: He is easily aroused.     Comments: Oriented to person. Forgetful.  Psychiatric:        Attention and Perception: He is inattentive.        Speech: Speech is delayed.            Vital Signs: BP 129/70 (BP Location: Left Arm)   Pulse (!) 107   Temp 97.8 F (36.6 C) (Oral)   Resp (!) 24   Ht '5\' 11"'$  (1.803 m)   Wt 70.7 kg   SpO2 100%   BMI 21.74 kg/m  SpO2: SpO2: 100 % O2 Device: O2 Device: High Flow Nasal Cannula O2 Flow Rate: O2 Flow Rate (L/min): (S) 9 L/min(found on 11L decreased to 9L due to sats of 100%)  Intake/output summary:   Intake/Output Summary (Last 24 hours) at 03/01/2019 1451 Last data filed at 03/01/2019 0076 Gross per 24 hour  Intake 2236.99 ml  Output 2350 ml  Net -113.01 ml   LBM: Last BM Date: 03/01/19 Baseline Weight: Weight: 78.8 kg Most recent weight: Weight: 70.7 kg       Palliative Assessment/Data: PPS 30%    Flowsheet Rows     Most Recent Value  Intake Tab  Referral Department  Critical care  Unit at Time of Referral  ICU  Palliative Care Primary Diagnosis  Sepsis/Infectious Disease  Date Notified  02/16/19  Palliative Care Type  New Palliative care  Reason for referral  Clarify Goals of Care, Psychosocial or Spiritual support  Date of Admission  02/18/2019  Date first seen by Palliative Care  02/17/19  # of days Palliative referral response time  1 Day(s)  # of days IP prior to Palliative referral  5  Clinical  Assessment  Palliative Performance Scale Score  30%  Pain Max last 24 hours  Not able to report  Pain Min Last 24 hours  Not able to report   Dyspnea Max Last 24 Hours  Not able to report  Dyspnea Min Last 24 hours  Not able to report  Nausea Max Last 24 Hours  Not able to report  Nausea Min Last 24 Hours  Not able to report  Anxiety Max Last 24 Hours  Not able to report  Anxiety Min Last 24 Hours  Not able to report  Other Max Last 24 Hours  Not able to report  Psychosocial & Spiritual Assessment  Palliative Care Outcomes  Patient/Family meeting held?  Yes  Who was at the meeting?  dtr, karen bolden  Palliative Care Outcomes  Provided psychosocial or spiritual support      Patient Active Problem List   Diagnosis Date Noted  . Aspiration pneumonia ([place])   . Acute hypoxemic respiratory failure ([place])   . DNR (do not resuscitate)   . Encounter for feeding tube placement   . Palliative care encounter   . Respiratory failure ([place])   . Ventilator dependent ([place])   . Multiple myeloma not having achieved remission ([place])   . Endotracheally intubated   . Pressure injury of skin 02/17/2019  . Goals of care, counseling/discussion   . Palliative care by specialist   . Protein-calorie malnutrition, severe 02/14/2019  . Altered mental status   . DKA (diabetic ketoacidosis) ([place]) 02/13/2019  . E coli bacteremia 05/02/2017  . Lower urinary tract infectious disease   . Sepsis ([place])   . Insulin dependent type 2 diabetes mellitus, uncontrolled ([place])   . Hemorrhoids   . Diabetic mononeuropathy associated with type 2 diabetes mellitus ([place])   . Hyperglycemia 05/01/2017  . Hyperlipidemia   . History of CVA with residual deficit   . TIA (transient ischemic attack) 02/02/2015  . Numbness 03/29/2014  . Stroke ([place]) 03/28/2014  . Diabetes mellitus ([place]) 03/28/2014  . CVA (cerebral infarction) 01/18/2014  . Acute CVA (cerebrovascular accident) ([place]) 01/17/2014  . CVA (cerebral vascular accident) ([place]) 01/15/2014  . Essential hypertension 01/15/2014  . Bradycardia 01/15/2014  . chronic Right sided weakness 01/15/2014  . GERD  (gastroesophageal reflux disease) 01/15/2014  . Neuropathy 01/15/2014    Palliative Care Assessment & Plan   Patient Profile: 78 y.o.malewith past medical history of coronary artery disease, CVA 2002, 2015; diabetes type 2, GERD, hypertension, COPD, dementia,admitted on 7/19/2020after being found down (potentially for 3 days), with fever, hypotension, tachycardia. He was diagnosed with E. coli bacteremia as well as DKA. He was started on antibiotics and insulin drip. He was initially admitted to the ICU on 02/05/2019 and transferred out of ICU on 02/13/2019. On 02/16/2019 he had increased oxygen demands, questionable aspiration, and was transferred back to ICU where he has now been intubated. 8/1 extubated but 8/3 developed progressive hypoxic respiratory failure. CXR with bilateral pulmonary edema. Patient placed on BiPAP and family decision for DNR.   CT of the abdomen performed on 02/16/2019 revealed new onset of either multiple myeloma or metastatic bony disease from unknown primary  Consult ordered for goals of care and psychosocial support in the setting of new diagnosis of cancer with underlying poor functional status, recurrent respiratory failure.   Assessment: Acute hypoxic respiratory failure  Aspiration pneumonia Acute bilateral pulmonary edema Acute on chronic diastolic CHF E Coli Bacteremia Concern for multiple myeloma (multiple spine and pelvic bone lesions on initial  imaging) Acute metabolic encephalopathy Hx of CVA Moderate protein calorie malnutrition  Recommendations/Plan:  DNR/DNI. No escalation of care.   F/u GOC with son, Maurice Stone who is spokesperson for the family. (Decisions are discussed with his three sisters). Maurice Stone leaning towards transition to comfort measures and hospice referral but would like to discuss with his sisters prior to making final decision.   Maurice Stone agreeable to initiate comfort medications. He is very worried about his father's secretions.    Robinul BID   Scopolamine patch  Roxanol 5-10m per tube q3h prn pain/dyspnea/tachypnea/air hunger  Continue current plan of care until family makes decision on comfort/hospice. PMT provider to f/u tomorrow.    Code Status: DNR   Code Status Orders  (From admission, onward)         Start     Ordered   02/26/19 1438  Do not attempt resuscitation (DNR)  Continuous    Question Answer Comment  In the event of cardiac or respiratory ARREST Do not call a "code blue"   In the event of cardiac or respiratory ARREST Do not perform Intubation, CPR, defibrillation or ACLS   In the event of cardiac or respiratory ARREST Use medication by any route, position, wound care, and other measures to relive pain and suffering. May use oxygen, suction and manual treatment of airway obstruction as needed for comfort.      02/26/19 1437        Code Status History    Date Active Date Inactive Code Status Order ID Comments User Context   02/04/2019 1843 02/26/2019 1437 Full Code [id number] MNeva Seat MD ED   01/30/2019 1837 02/06/2019 1843 Full Code [id number] MNeva Seat MD ED   05/01/2017 1900 05/08/2017 2042 Full Code [id number] SAlphonzo Grieve MD Inpatient   12/10/2016 2149 12/11/2016 2324 Full Code [id number] GEtta Quill DO ED   02/02/2015 1815 02/03/2015 1857 Full Code [id number] SNita Sells MD Inpatient   03/28/2014 1930 04/01/2014 1843 Full Code [id number] AHosie Poisson MD Inpatient   01/18/2014 1609 02/02/2014 1421 Full Code [id number] ACathlyn Parsons PA-C Inpatient   01/18/2014 1609 01/18/2014 1609 Full Code [id number] ACathlyn Parsons PA-C Inpatient   01/15/2014 2158 01/18/2014 1609 Full Code [id number] PBerle Mull MD Inpatient   Advance Care Planning Activity       Prognosis:  Poor prognosis: high risk for aspiration leading to respiratory distress/decompensation again.  Discharge Planning:  To Be Determined  Care plan was discussed with patient, RN,  son (Maurice Stone, IMTS  Thank you for allowing the Palliative Medicine Team to assist in the care of this patient.   Time In: 11950Time Out: 1545 Total Time 60 Prolonged Time Billed  no      Greater than 50%  of this time was spent counseling and coordinating care related to the above assessment and plan.  MIhor Dow DNP, FNP-C Palliative Medicine Team  Phone: [phone]: [phone] Please contact Palliative Medicine Team phone at [phone] questions and concerns.

## 2019-03-02 DIAGNOSIS — Z515 Encounter for palliative care: Secondary | ICD-10-CM

## 2019-03-02 DIAGNOSIS — R0902 Hypoxemia: Secondary | ICD-10-CM

## 2019-03-02 DIAGNOSIS — K117 Disturbances of salivary secretion: Secondary | ICD-10-CM

## 2019-03-02 LAB — GLUCOSE, CAPILLARY
Glucose-Capillary: 110 mg/dL — ABNORMAL HIGH (ref 70–99)
Glucose-Capillary: 127 mg/dL — ABNORMAL HIGH (ref 70–99)
Glucose-Capillary: 128 mg/dL — ABNORMAL HIGH (ref 70–99)

## 2019-03-02 LAB — BASIC METABOLIC PANEL
Anion gap: 12 (ref 5–15)
BUN: 41 mg/dL — ABNORMAL HIGH (ref 8–23)
CO2: 28 mmol/L (ref 22–32)
Calcium: 8.2 mg/dL — ABNORMAL LOW (ref 8.9–10.3)
Chloride: 103 mmol/L (ref 98–111)
Creatinine, Ser: 1.4 mg/dL — ABNORMAL HIGH (ref 0.61–1.24)
GFR calc Af Amer: 56 mL/min — ABNORMAL LOW (ref >60)
GFR calc non Af Amer: 48 mL/min — ABNORMAL LOW (ref >60)
Glucose, Bld: 141 mg/dL — ABNORMAL HIGH (ref 70–99)
Potassium: 3.8 mmol/L (ref 3.5–5.1)
Sodium: 143 mmol/L (ref 135–145)

## 2019-03-02 LAB — CBC
HCT: 27.3 % — ABNORMAL LOW (ref 39.0–52.0)
Hemoglobin: 8.1 g/dL — ABNORMAL LOW (ref 13.0–17.0)
MCH: 28.1 pg (ref 26.0–34.0)
MCHC: 29.7 g/dL — ABNORMAL LOW (ref 30.0–36.0)
MCV: 94.8 fL (ref 80.0–100.0)
Platelets: 332 10*3/uL (ref 150–400)
RBC: 2.88 MIL/uL — ABNORMAL LOW (ref 4.22–5.81)
RDW: 17.7 % — ABNORMAL HIGH (ref 11.5–15.5)
WBC: 15.9 10*3/uL — ABNORMAL HIGH (ref 4.0–10.5)
nRBC: 1.8 % — ABNORMAL HIGH (ref 0.0–0.2)

## 2019-03-02 MED ORDER — MORPHINE SULFATE (PF) 2 MG/ML IV SOLN
1.0000 mg | INTRAVENOUS | Status: DC | PRN
  Administered 2019-03-03 (×2): 2 mg via INTRAVENOUS
  Filled 2019-03-02 (×2): qty 1

## 2019-03-02 MED ORDER — POLYVINYL ALCOHOL 1.4 % OP SOLN: 1.0000 [drp] | Freq: Four times a day (QID) | OPHTHALMIC | Status: DC | PRN

## 2019-03-02 MED ORDER — LORAZEPAM 1 MG PO TABS: 1.0000 mg | ORAL_TABLET | ORAL | Status: DC | PRN

## 2019-03-02 MED ORDER — ONDANSETRON HCL 4 MG/2ML IJ SOLN: 4.0000 mg | Freq: Four times a day (QID) | INTRAMUSCULAR | Status: DC | PRN

## 2019-03-02 MED ORDER — HALOPERIDOL LACTATE 5 MG/ML IJ SOLN: 0.5000 mg | INTRAMUSCULAR | Status: DC | PRN

## 2019-03-02 MED ORDER — LORAZEPAM 2 MG/ML IJ SOLN
1.0000 mg | INTRAMUSCULAR | Status: DC | PRN
  Administered 2019-03-03 (×3): 1 mg via INTRAVENOUS
  Filled 2019-03-02 (×3): qty 1

## 2019-03-02 MED ORDER — MORPHINE SULFATE (PF) 2 MG/ML IV SOLN
1.0000 mg | Freq: Once | INTRAVENOUS | Status: AC
  Administered 2019-03-02: 1 mg via INTRAVENOUS
  Filled 2019-03-02: qty 1

## 2019-03-02 MED ORDER — HALOPERIDOL 0.5 MG PO TABS
0.5000 mg | ORAL_TABLET | ORAL | Status: DC | PRN
  Filled 2019-03-02: qty 1

## 2019-03-02 MED ORDER — BIOTENE DRY MOUTH MT LIQD: 15.0000 mL | OROMUCOSAL | Status: DC | PRN

## 2019-03-02 MED ORDER — ONDANSETRON 4 MG PO TBDP: 4.0000 mg | ORAL_TABLET | Freq: Four times a day (QID) | ORAL | Status: DC | PRN

## 2019-03-02 MED ORDER — LORAZEPAM 2 MG/ML PO CONC: 1.0000 mg | ORAL | Status: DC | PRN

## 2019-03-02 MED ORDER — HALOPERIDOL LACTATE 2 MG/ML PO CONC
0.5000 mg | ORAL | Status: DC | PRN
  Filled 2019-03-02: qty 0.3

## 2019-03-02 NOTE — Social Work (Addendum)
3:33pm- [name], pt son returned call. Family preference is for [facility], referral made to [place]. Aware there is no beds today will continue to follow.  2:18pm- HIPAA compliant message left for pt son at [phone]; following up for hospice home preference for referral.   Westley Hummer, MSW, [place] [facility] [phone]

## 2019-03-02 NOTE — Progress Notes (Signed)
VAST to pt's bedside to change dsg on midline. Noted 2 PIV's located in pt's left arm as well. Pt is receiving one IV med. Contacted pt's nurse, Alma Friendly and asked that she remove PIV's as they are no longer needed. Alma Friendly verbalized understanding.

## 2019-03-02 NOTE — Progress Notes (Signed)
Performed CPT on patient this AM.  Patient would not cough when asked.  When attempted to NTS patient patient would not allow RT to suction both nasally and orally.  Sats currently at 100%.  Will continue to monitor.

## 2019-03-02 NOTE — Progress Notes (Signed)
  Date: 03/02/2019  Patient name: Maurice Stone  Medical record number: [id number]  Date of birth: [birth year]   I have seen and evaluated this patient and I have discussed the plan of care with the house staff. Please see Dr. Margy Clarks note for complete details. I concur with her findings and plan.     Sid Falcon, MD 03/02/2019, 1:49 PM

## 2019-03-02 NOTE — Progress Notes (Signed)
Manufacturing engineer [facility]) Hospital Liaison note.   Received request from Westley Hummer, [place] for family interest in [facility]. Chart reviewed and spoke with family to acknowledge referral.  Unfortunately [facility] is not able to offer a room today. Family and CSW are aware [facility] liaison will follow up with CSW and family tomorrow or sooner if room becomes available.  Please do not hesitate to call with questions.   Thank you,  Farrel Gordon, RN, [facility]   [place]     [place] are on AMION

## 2019-03-02 NOTE — Progress Notes (Signed)
Nutrition Brief Note  Chart reviewed. Pt now transitioning to comfort care.  No further nutrition interventions warranted at this time.  Please re-consult as needed.   Yuepheng Schaller A. Peta Peachey, RD, LDN, CDCES Registered Dietitian II Certified Diabetes Care and Education Specialist Pager: [phone] After hours Pager: [phone]  

## 2019-03-02 NOTE — Progress Notes (Signed)
Daily Progress Note   Patient Name: Maurice Stone       Date: 03/02/2019 DOB: Jan 30, 1941  Age: 78 y.o. MRN#: [id number] Attending Physician: Sid Falcon, MD Primary Care Physician: [facility] Admit Date: 02/21/2019  Reason for Consultation/Follow-up: Establishing goals of care and Psychosocial/spiritual support  Subjective: Patient opens eyes but doesn't speak.  He appears to be in the dying process.   Spoke with [name] on the phone.  He confirmed feeling that his father is dying as well.   Ideally he would like for him to be transferred the [facility].  I explained that beds have been taking several days to free up.  We discussed getting rid of cor trak, tele, labs, etc.  Will transfer to 6 N.  Will leave oxygen in place today to allow all children to visit.  After ward will wean oxygen.   Do not want to prolong the dying process.  Allow nature to take its course.  [name] is proud of his father.  Stated he has put up a great fight in the hospital against this illness.  I agreed.  This wording of being a fighter is important to his children.   Assessment: Patient in the dying process.   Shift to comfort.   Patient Profile/HPI:  78 y.o. male  with past medical history of coronary artery disease, CVA 2002, 2015; diabetes type 2, GERD, hypertension, COPD, dementia, admitted on 02/18/2019 after being found down (potentially for 3 days), with fever, hypotension, tachycardia.  He was diagnosed with E. coli bacteremia as well as DKA.  He was started on antibiotics and insulin drip.  He was initially admitted to the ICU on 02/03/2019 and transferred out of ICU on 02/13/2019.  On 02/16/2019 he had increased oxygen demands, questionable aspiration, and was transferred back to ICU where he  has now been intubated.  He is currently weaning. CT of the abdomen performed on 02/16/2019 revealed new onset of either multiple myeloma or metastatic bony disease from unknown primary    Length of Stay: 19  Current Medications: Scheduled Meds:  . chlorhexidine  15 mL Mouth Rinse BID  . Chlorhexidine Gluconate Cloth  6 each Topical Q0600  . collagenase   Topical Daily  . glycopyrrolate  1 mg Per Tube BID   Or  .  glycopyrrolate  0.1 mg Intravenous BID  . mouth rinse  15 mL Mouth Rinse q12n4p  . pantoprazole sodium  40 mg Per Tube Q24H  . scopolamine  1 patch Transdermal Q72H  . sodium chloride flush  3 mL Intravenous Q12H    Continuous Infusions: . sodium chloride Stopped (03/01/19 0146)    PRN Meds: sodium chloride, acetaminophen, antiseptic oral rinse, haloperidol **OR** haloperidol **OR** haloperidol lactate, ipratropium-albuterol, LORazepam **OR** LORazepam **OR** LORazepam, morphine injection, morphine CONCENTRATE, ondansetron **OR** ondansetron (ZOFRAN) IV, polyvinyl alcohol  Physical Exam       Well developed elderly male, sleeping soundly.  Does open his eyes to voice and touch - then goes right back to sleep. CV tachy and irreg Breathing not labored but coarse and wet sounding Abdomen  Soft.  Vital Signs: BP 117/65 (BP Location: Left Arm)   Pulse (!) 101   Temp 98.7 F (37.1 C)   Resp 16   Ht '5\' 11"'  (1.803 m)   Wt 70.7 kg   SpO2 100%   BMI 21.74 kg/m  SpO2: SpO2: 100 % O2 Device: O2 Device: Nasal Cannula O2 Flow Rate: O2 Flow Rate (L/min): 6 L/min  Intake/output summary:   Intake/Output Summary (Last 24 hours) at 03/02/2019 1010 Last data filed at 03/02/2019 0549 Gross per 24 hour  Intake 710 ml  Output 1325 ml  Net -615 ml   LBM: Last BM Date: 03/01/19 Baseline Weight: Weight: 78.8 kg Most recent weight: Weight: 70.7 kg       Palliative Assessment/Data: 10%      Patient Active Problem List   Diagnosis Date Noted  . Increased oropharyngeal  secretions   . Aspiration pneumonia ([place])   . Acute hypoxemic respiratory failure ([place])   . DNR (do not resuscitate)   . Encounter for feeding tube placement   . Palliative care encounter   . Respiratory failure ([place])   . Ventilator dependent ([place])   . Multiple myeloma not having achieved remission ([place])   . Endotracheally intubated   . Pressure injury of skin 02/17/2019  . Goals of care, counseling/discussion   . Palliative care by specialist   . Protein-calorie malnutrition, severe 02/14/2019  . Altered mental status   . DKA (diabetic ketoacidosis) ([place]) 02/08/2019  . E coli bacteremia 05/02/2017  . Lower urinary tract infectious disease   . Sepsis ([place])   . Insulin dependent type 2 diabetes mellitus, uncontrolled ([place])   . Hemorrhoids   . Diabetic mononeuropathy associated with type 2 diabetes mellitus ([place])   . Hyperglycemia 05/01/2017  . Hyperlipidemia   . History of CVA with residual deficit   . TIA (transient ischemic attack) 02/02/2015  . Numbness 03/29/2014  . Stroke ([place]) 03/28/2014  . Diabetes mellitus ([place]) 03/28/2014  . CVA (cerebral infarction) 01/18/2014  . Acute CVA (cerebrovascular accident) ([place]) 01/17/2014  . CVA (cerebral vascular accident) ([place]) 01/15/2014  . Essential hypertension 01/15/2014  . Bradycardia 01/15/2014  . chronic Right sided weakness 01/15/2014  . GERD (gastroesophageal reflux disease) 01/15/2014  . Neuropathy 01/15/2014    Palliative Care Plan    Recommendations/Plan:  Shift to full comfort.    Requested Hospice House.  But will also transfer to 6N  Discussed with RN.  Goals of Care and Additional Recommendations:  Limitations on Scope of Treatment: Full Comfort Care, No Glucose Monitoring and No Lab Draws  Code Status:  DNR  Prognosis:   Hours - Days   Discharge Planning:  Anticipated Hospital Death vs [place]  plan was discussed with family and RN  Thank you for allowing the Palliative Medicine Team  to assist in the care of this patient.  Total time spent:  35 min.     Greater than 50%  of this time was spent counseling and coordinating care related to the above assessment and plan.  Florentina Jenny, PA-C Palliative Medicine  Please contact Palliative MedicineTeam phone at [phone] for questions and concerns between 7 am - 7 pm.   Please see AMION for individual provider pager numbers.

## 2019-03-02 NOTE — Consult Note (Signed)
[facility] Nurse wound follow up Unstageable sacral /right buttock wound.  6x weekly hydrotherapy.  Enzymatic debridement topical therapy.  Implemented mattress with low air loss feature to offload pressure.  Continue to turn and reposition every two hours. Less devitalized tissue noted from last assessment  Wound type:unstageable pressure injury Measurement: 10 cm x11 cm unable to view wound bed due to eschar.  Softening and lifting noted Wound bed:70% eschar 15 % yellow fibrin 10% pale pink nongranulating Drainage (amount, consistency, odor) moderate serosanguinous with necrotic odor.  Periwound:intact Dressing procedure/placement/frequency:Continue daily Santyl dressings and PT hydrotherapy 6 x weekly. Implemented LALM and albumin 1.4.  Nutrition services is already following and making recommendations for optimal nutrition to promote healing.  [place] team will continue to assess weekly.   Domenic Moras MSN, RN, FNP-BC CWON Wound, Ostomy, Continence Nurse Pager [phone]

## 2019-03-02 NOTE — Progress Notes (Signed)
Patient resting comfortably throughout shift. No signs of pain or restlessness. Family at bedside.

## 2019-03-02 NOTE — Progress Notes (Signed)
Subjective: Patient seen and examined at bedside this AM. Patient does not appear to be in acute distress. He is resting comfortably in bed. He is minimally responsive and opens his eyes when addressed but nonverbal. Patient is saturating well on 10L HFNC.   Objective:  Vital signs in last 24 hours: Vitals:   03/01/19 2138 03/02/19 0216 03/02/19 0415 03/02/19 0631  BP:    117/65  Pulse:    (!) 101  Resp: (!) 30 (!) 26 (!) 24 16  Temp:    98.7 F (37.1 C)  TempSrc:      SpO2:    100%  Weight:      Height:       Physical Exam  Constitutional: He appears cachectic. He has a sickly appearance. No distress.  HENT:  Head: Normocephalic and atraumatic.  Cardiovascular: Normal rate, regular rhythm, normal heart sounds and intact distal pulses. Exam reveals no gallop and no friction rub.  No murmur heard. Pulmonary/Chest: Effort normal and breath sounds normal. No respiratory distress. He has no wheezes. He has no rales.  Abdominal: Soft. Bowel sounds are normal. He exhibits no distension. There is no abdominal tenderness. There is no guarding.  Neurological:  Patient is somnolent with eyes opening to voice and touch but closes eyes again and does not respond to questioning.   Skin: He is not diaphoretic.     Assessment/Plan:  Active Problems:   Sepsis ([place])   DKA (diabetic ketoacidosis) (HCC)   Altered mental status   Protein-calorie malnutrition, severe   Pressure injury of skin   Goals of care, counseling/discussion   Palliative care by specialist   Endotracheally intubated   Respiratory failure ([place])   Ventilator dependent ([place])   Multiple myeloma not having achieved remission ([place])   Encounter for feeding tube placement   Palliative care encounter   Acute hypoxemic respiratory failure ([place])   DNR (do not resuscitate)   Aspiration pneumonia ([place])   Increased oropharyngeal secretions  Acute Hypoxic Respiratory Failure: Initially due to PNA (likely from aspiration  given his encephalopathy surrounding this event). Extubated on 8/1, but required BiPAP on 8/3. Found to be vol overloaded and improved with diureses in the ICU. Still with secretions and rhochorous breath sounds this AM. However, patient has been transferred to complete comfort care and we will focus on making patient comfortable with supportive treatment.   - Continue supplemental O2 and wean after visitation with family  - Full comfort care measures    Concern for Multiple Myeloma: Multiple spine and pelvic bone lesion seen on initial imaging. Metastatic disease not ruled out, but MM suspected. M-protein 0.7 on SPEP, and Bence Jones protein in urine,Kappa type, urine M-protein 1.1g/24h. Patient is now comfort care only.    - Bone biopsy on hold given his clinic status and comorbidities  AKI Hypernatremia (resolved)  Hypokalemia (resolved)  Hypophosphatemia (resolved) Initial AKI the setting of DKA and being found down had resolved with Cr 1.18.Marland Kitchen Cr is 1.4 today with  Na 143 today.   - Patient is comfort care.   Anemia Thrombocytopenia Platelets stable. Hgb has down trended since admission. Stable at 7-8 past few days.  Hgb 8.1 today  - Minimize phlebotomy - Comfort care measures in place    E-Coli Bacteremia from urinary source: Completed 7 days of cefepime on 8/1.  - Continue to monitor   Dementia Acute metabolic encephalopathy: Today patient is very somnolent and only opens eyes to name and touch. He does not  respond to questioning. Per palliative care, goals of care discussed with his son, [name]. He was informed of the prognosis and discussed discontinuation of tube feeds, telemetry monitoring, labs, etc. Patient to be transferred to 6N with O2 and allowed visitation. Patient to be weaned off O2 afterwards and allow dying process.   - Comfort care measures   HTN  - Comfort care measures   DM: DKA on admission has resolved. Patient is no longer on tube feeds.   -  Comfort care measures.   Protein calorie malnutrition - Oral care - Patient is on comfort care.   Dispo: Anticipated discharge in approximately 1-2 day(s).   Harvie Heck, MD  Internal Medicine, PGY-1 03/02/2019, 9:19 AM Pager: [phone]

## 2019-03-02 NOTE — Progress Notes (Signed)
PT Cancellation Note  Patient Details Name: Maurice Stone MRN: [id number] DOB: 08/02/1940   Cancelled Treatment:    Reason Eval/Treat Not Completed: Noted pt transitioning to comfort care. Hydrotherapy orders discontinued.    Thelma Comp 03/02/2019, 12:03 PM   [name], PT, DPT Acute Rehabilitation Services Pager: [phone] Office: [phone]

## 2019-03-03 DIAGNOSIS — Z515 Encounter for palliative care: Secondary | ICD-10-CM

## 2019-03-03 LAB — CULTURE, BLOOD (ROUTINE X 2)
Special Requests: ADEQUATE
Special Requests: ADEQUATE

## 2019-03-03 MED ORDER — ACETAMINOPHEN 10 MG/ML IV SOLN
1000.0000 mg | Freq: Four times a day (QID) | INTRAVENOUS | Status: AC
  Administered 2019-03-03 – 2019-03-04 (×4): 1000 mg via INTRAVENOUS
  Filled 2019-03-03 (×4): qty 100

## 2019-03-03 MED ORDER — MORPHINE BOLUS VIA INFUSION
2.0000 mg | INTRAVENOUS | Status: DC | PRN
  Administered 2019-03-03: 2 mg via INTRAVENOUS
  Filled 2019-03-03: qty 4

## 2019-03-03 MED ORDER — MORPHINE 100MG IN NS 100ML (1MG/ML) PREMIX INFUSION
2.0000 mg/h | INTRAVENOUS | Status: DC
  Administered 2019-03-03: 2 mg/h via INTRAVENOUS
  Administered 2019-03-05: 3 mg/h via INTRAVENOUS
  Filled 2019-03-03 (×2): qty 100

## 2019-03-03 NOTE — Progress Notes (Signed)
Manufacturing engineer [facility] Hospital Liaison note.   Unfortunately [facility] is not able to offer a room today. Family and CSW are aware [facility] liaison will follow up with CSW and family tomorrow or sooner if room becomes available. Please do not hesitate to call with questions.   Thank you,  Farrel Gordon, RN, [facility]   [place]     [place] are on AMION

## 2019-03-03 NOTE — TOC Progression Note (Signed)
Transition of Care [facility]) - Progression Note    Patient Details  Name: Maurice Stone MRN: [id number] Date of Birth: [birth year]  Transition of Care [facility]) CM/SW [place], LCSW Phone Number: 03/03/2019, 9:54 AM  Clinical Narrative: No available beds at [facility] today.        Expected Discharge Plan and Services                                                 Social Determinants of Health (SDOH) Interventions    Readmission Risk Interventions No flowsheet data found.

## 2019-03-03 NOTE — Progress Notes (Signed)
   Subjective: Patient seen and examined at bedside this AM.  His daughter is at the bedside. She states other family members will be rotating throughout the day to visit. She had no questions at this time.  Objective:  Vital signs in last 24 hours: Vitals:   03/02/19 0415 03/02/19 0631 03/03/19 0212 03/03/19 0522  BP:  117/65 119/68 119/69  Pulse:  (!) 101 (!) 102 (!) 108  Resp: (!) '24 16 15 19  '$ Temp:  98.7 F (37.1 C) 97.6 F (36.4 C) 98.6 F (37 C)  TempSrc:   Axillary Axillary  SpO2:  100% 100% 99%  Weight:      Height:       Physical Exam Vitals signs reviewed.  Constitutional:      Appearance: He is ill-appearing.     Comments: Pt nonverbal. Will open eyes to voice and touch.  Cardiovascular:     Rate and Rhythm: Normal rate and regular rhythm.  Pulmonary:     Effort: Pulmonary effort is normal. No respiratory distress.     Comments: Coarse and wet breath sounds Abdominal:     General: There is no distension.     Palpations: Abdomen is soft.     Assessment/Plan:  Maurice Stone is a 78 year old M with significant PMH of CAD, CVA in 2002 and 2015, type 2 diabetes, GERD, HTN, COPD, and dementia who was admitted after being found down with altered mental status, fever, hypotension, and tachycardia. He was found to be in DKA and rhabdomyosis with an AKI. Also diagnosed with E coli bacteremia. Hospital course was complicated by hypoxic respiratory failure due to aspiration PNA requiring 2 ICU admissions for increased oxygen demands and intubation, extubated on 8/1 and requiring BiPAP 8/3. Imaging during hospitalization also revealed concern for multiple myeloma vs metastatic bone lesions from unknown primary. Given pt's clinical status no further work-up was pursued.   Altered Mental Status and Acute Hypoxic Respiratory Failure Patient shifted to comfort care yesterday after palliative care spoke to son, Maurice Stone. Will continue to coordinate with social work for transfer to [facility] (no bed available as of today). Continue to leave oxygen in place to allow his four children to visit today and will wean tomorrow to not prolong the dying process. - full comfort care measures  - discontinuation of tube feeds, telemetry monitoring, labs, etc.   Dispo: Hopeful for transfer to hospice house in approximately 1-2 days; death imminent   Ladona Horns, MD  Internal Medicine, PGY-1 03/03/2019, 7:56 AM Pager: [phone]

## 2019-03-03 NOTE — Progress Notes (Signed)
Daily Progress Note   Patient Name: Maurice Stone       Date: 03/03/2019 DOB: [birth year]  Age: 78 y.o. MRN#: [id number] Attending Physician: Sid Falcon, MD Primary Care Physician: [facility] Admit Date: 02/05/2019  Reason for Consultation/Follow-up: Establishing goals of care and Psychosocial/spiritual support  Subjective: Spoke with Maurice Stone at bedside.  He noticed his father is hot to touch and sweaty.  [name] also has increased work of breathing.  RN has given PRN medications but patient is still having symptoms.  Discussed starting a continuous infusion of medication for comfort.  Also starting IV tylenol for fever/comfort.  Maurice Stone spoke about his sisters Maurice Stone and Maurice Stone.  They have not seen their father for several days and are now in denial - hoping he will recover.  Maurice Stone states his sisters will be her this afternoon and asks me to speak with them.  Maurice Stone states it is important for him to be here with his father.  His father always regretted not being able to say goodbye to his Dad - whom Maurice Stone is named for.   Assessment: 78 yo male actively dying.  Will initiate morphine gtt.   Patient Profile/HPI:  78 y.o. male  with past medical history of coronary artery disease, CVA 2002, 2015; diabetes type 2, GERD, hypertension, COPD, dementia, admitted on 01/26/2019 after being found down (potentially for 3 days), with fever, hypotension, tachycardia.  He was diagnosed with E. coli bacteremia as well as DKA.  He was started on antibiotics and insulin drip.  He was initially admitted to the ICU on 02/03/2019 and transferred out of ICU on 02/13/2019.  On 02/16/2019 he had increased oxygen demands, questionable aspiration, and was transferred back to ICU where he has now been  intubated.  He is currently weaning. CT of the abdomen performed on 02/16/2019 revealed new onset of either multiple myeloma or metastatic bony disease from unknown primary     Length of Stay: 20  Current Medications: Scheduled Meds:  . chlorhexidine  15 mL Mouth Rinse BID  . Chlorhexidine Gluconate Cloth  6 each Topical Q0600  . collagenase   Topical Daily  . glycopyrrolate  1 mg Per Tube BID   Or  . glycopyrrolate  0.1 mg Intravenous BID  . mouth rinse  15 mL Mouth Rinse q12n4p  .  pantoprazole sodium  40 mg Per Tube Q24H  . scopolamine  1 patch Transdermal Q72H  . sodium chloride flush  3 mL Intravenous Q12H    Continuous Infusions: . sodium chloride Stopped (03/01/19 0146)  . acetaminophen    . morphine      PRN Meds: sodium chloride, acetaminophen, antiseptic oral rinse, haloperidol **OR** haloperidol **OR** haloperidol lactate, ipratropium-albuterol, LORazepam **OR** LORazepam **OR** LORazepam, morphine injection, morphine, morphine CONCENTRATE, ondansetron **OR** ondansetron (ZOFRAN) IV, polyvinyl alcohol  Physical Exam        Well developed male.  Does not awaken to my voice or touch.  diaphorectic Resp mildly increased work of breathing, breath sounds are wet to auscultation CV tachy and irreg Feels extra warm to touch   Vital Signs: BP 119/69 (BP Location: Left Arm)   Pulse (!) 108   Temp 98.6 F (37 C) (Axillary)   Resp 19   Ht '5\' 11"'  (1.803 m)   Wt 70.7 kg   SpO2 99%   BMI 21.74 kg/m  SpO2: SpO2: 99 % O2 Device: O2 Device: Nasal Cannula O2 Flow Rate: O2 Flow Rate (L/min): 9 L/min  Intake/output summary:   Intake/Output Summary (Last 24 hours) at 03/03/2019 1249 Last data filed at 03/03/2019 0525 Gross per 24 hour  Intake 3 ml  Output 450 ml  Net -447 ml   LBM: Last BM Date: 03/01/19 Baseline Weight: Weight: 78.8 kg Most recent weight: Weight: 70.7 kg       Palliative Assessment/Data: 10%      Patient Active Problem List   Diagnosis Date  Noted  . Hypoxia   . Comfort measures only status   . Increased oropharyngeal secretions   . Aspiration pneumonia ([place])   . Acute hypoxemic respiratory failure ([place])   . DNR (do not resuscitate)   . Encounter for feeding tube placement   . Palliative care encounter   . Respiratory failure ([place])   . Ventilator dependent ([place])   . Multiple myeloma not having achieved remission ([place])   . Endotracheally intubated   . Pressure injury of skin 02/17/2019  . Goals of care, counseling/discussion   . Palliative care by specialist   . Protein-calorie malnutrition, severe 02/14/2019  . Altered mental status   . DKA (diabetic ketoacidosis) ([place]) 01/25/2019  . E coli bacteremia 05/02/2017  . Lower urinary tract infectious disease   . Sepsis ([place])   . Insulin dependent type 2 diabetes mellitus, uncontrolled ([place])   . Hemorrhoids   . Diabetic mononeuropathy associated with type 2 diabetes mellitus ([place])   . Hyperglycemia 05/01/2017  . Hyperlipidemia   . History of CVA with residual deficit   . TIA (transient ischemic attack) 02/02/2015  . Numbness 03/29/2014  . Stroke ([place]) 03/28/2014  . Diabetes mellitus ([place]) 03/28/2014  . CVA (cerebral infarction) 01/18/2014  . Acute CVA (cerebrovascular accident) ([place]) 01/17/2014  . CVA (cerebral vascular accident) ([place]) 01/15/2014  . Essential hypertension 01/15/2014  . Bradycardia 01/15/2014  . chronic Right sided weakness 01/15/2014  . GERD (gastroesophageal reflux disease) 01/15/2014  . Neuropathy 01/15/2014    Palliative Care Plan    Recommendations/Plan:  PMT will re-round later in the day.  Patient actively dying.  Not able to transport at this time.  We can reassess if he becomes stable on morphine gtt.  Started morphine gtt with bolus PRN  Started IV tylenol for 24 hours.  Goals of Care and Additional Recommendations:  Limitations on Scope of Treatment: Full Comfort Care  Code Status:  DNR  Prognosis:   Hours - Days    Discharge Planning:  Anticipated Hospital Death  Care plan was discussed with son and bedside RN  Thank you for allowing the Palliative Medicine Team to assist in the care of this patient.  Total time spent:  35 min.     Greater than 50%  of this time was spent counseling and coordinating care related to the above assessment and plan.  Florentina Jenny, PA-C Palliative Medicine  Please contact Palliative MedicineTeam phone at [phone] for questions and concerns between 7 am - 7 pm.   Please see AMION for individual provider pager numbers.

## 2019-03-04 MED ORDER — GLYCOPYRROLATE 0.2 MG/ML IJ SOLN
0.4000 mg | Freq: Three times a day (TID) | INTRAMUSCULAR | Status: DC
  Administered 2019-03-04 (×3): 0.4 mg via INTRAVENOUS
  Filled 2019-03-04 (×4): qty 2

## 2019-03-04 NOTE — Progress Notes (Signed)
Pt has a slightly more labored breathing, discussed with son and increased his morphine drip to 3.  Oral care done and pt repositioned for comfort. Son at bedside, awaiting another sister to come tonight, playing music and holding his dads hand.

## 2019-03-04 NOTE — Progress Notes (Signed)
2 family members at bedside and pt appears very comfortable, morphine drip at 2. Comfort cart at bedside.

## 2019-03-04 NOTE — Progress Notes (Signed)
Daily Progress Note   Patient Name: Maurice Stone       Date: 03/04/2019 DOB: 03-19-1941  Age: 78 y.o. MRN#: [id number] Attending Physician: Sid Falcon, MD Primary Care Physician: [facility] Admit Date: 02/06/2019  Reason for Consultation/Follow-up: Establishing goals of care and Psychosocial/spiritual support  Subjective: Examined patient at bedside.  His daughter Tillie Rung stayed the night.  She describes counting the pauses between his breathing They have been up to 10 seconds in length.   Patient had calm comfortable night on morphine gtt.  Had an open discussion with Tillie Rung about several things.  She has two sisters that are having difficulty accepting their father's passing - they are focused on the oxygen.  I explained that we are slowing down the dying process which makes it more difficult and longer for him to go thru.  Further at some point the oxygen will make no difference.  Tillie Rung understands and says that we may need to meet as a family again for me to explain that to her sisters.      I offered support and am happy to meet with the family again when needed.   Assessment: Actively dying.  Appears comfortable.  Breath sounds are wet.   Patient Profile/HPI:  78 y.o. male  with past medical history of coronary artery disease, CVA 2002, 2015; diabetes type 2, GERD, hypertension, COPD, dementia, admitted on 01/28/2019 after being found down (potentially for 3 days), with fever, hypotension, tachycardia.  He was diagnosed with E. coli bacteremia as well as DKA.  He was started on antibiotics and insulin drip.  He was initially admitted to the ICU on 02/23/2019 and transferred out of ICU on 02/13/2019.  On 02/16/2019 he had increased oxygen demands, questionable aspiration,  and was transferred back to ICU where he has now been intubated.  He is currently weaning. CT of the abdomen performed on 02/16/2019 revealed new onset of either multiple myeloma or metastatic bony disease from unknown primary     Length of Stay: 21  Current Medications: Scheduled Meds:  . chlorhexidine  15 mL Mouth Rinse BID  . Chlorhexidine Gluconate Cloth  6 each Topical Q0600  . collagenase   Topical Daily  . glycopyrrolate  1 mg Per Tube BID   Or  . glycopyrrolate  0.1 mg Intravenous  BID  . mouth rinse  15 mL Mouth Rinse q12n4p  . pantoprazole sodium  40 mg Per Tube Q24H  . scopolamine  1 patch Transdermal Q72H  . sodium chloride flush  3 mL Intravenous Q12H    Continuous Infusions: . sodium chloride Stopped (03/01/19 0146)  . morphine 2 mg/hr (03/03/19 1322)    PRN Meds: sodium chloride, acetaminophen, antiseptic oral rinse, haloperidol **OR** haloperidol **OR** haloperidol lactate, ipratropium-albuterol, LORazepam **OR** LORazepam **OR** LORazepam, morphine injection, morphine, morphine CONCENTRATE, ondansetron **OR** ondansetron (ZOFRAN) IV, polyvinyl alcohol  Physical Exam        Chronically ill appearing male, non-responsive, appears as though he is sleeping. CV tachy Resp no distress, but sounds wet. Abdomen soft, nd Skin is warm not hot.  Vital Signs: BP 126/60 (BP Location: Left Arm)   Pulse (!) 103   Temp 99.2 F (37.3 C) (Oral)   Resp 14   Ht _0  (1.803 m)   Wt 70.7 kg   SpO2 99%   BMI 21.74 kg/m  SpO2: SpO2: 99 % O2 Device: O2 Device: Nasal Cannula O2 Flow Rate: O2 Flow Rate (L/min): 8 L/min  Intake/output summary:   Intake/Output Summary (Last 24 hours) at 03/04/2019 0912 Last data filed at 03/04/2019 8921 Gross per 24 hour  Intake 106.08 ml  Output 1250 ml  Net -1143.92 ml   LBM: Last BM Date: 03/03/19(flexi seal) Baseline Weight: Weight: 78.8 kg Most recent weight: Weight: 70.7 kg       Palliative Assessment/Data:  10%   Patient  Active Problem List   Diagnosis Date Noted  . Terminal care   . Hypoxia   . Comfort measures only status   . Increased oropharyngeal secretions   . Aspiration pneumonia ([place])   . Acute hypoxemic respiratory failure ([place])   . DNR (do not resuscitate)   . Encounter for feeding tube placement   . Palliative care encounter   . Respiratory failure ([place])   . Ventilator dependent ([place])   . Multiple myeloma not having achieved remission ([place])   . Endotracheally intubated   . Pressure injury of skin 02/17/2019  . Goals of care, counseling/discussion   . Palliative care by specialist   . Protein-calorie malnutrition, severe 02/14/2019  . Altered mental status   . DKA (diabetic ketoacidosis) ([place]) 02/03/2019  . E coli bacteremia 05/02/2017  . Lower urinary tract infectious disease   . Sepsis ([place])   . Insulin dependent type 2 diabetes mellitus, uncontrolled ([place])   . Hemorrhoids   . Diabetic mononeuropathy associated with type 2 diabetes mellitus ([place])   . Hyperglycemia 05/01/2017  . Hyperlipidemia   . History of CVA with residual deficit   . TIA (transient ischemic attack) 02/02/2015  . Numbness 03/29/2014  . Stroke ([place]) 03/28/2014  . Diabetes mellitus ([place]) 03/28/2014  . CVA (cerebral infarction) 01/18/2014  . Acute CVA (cerebrovascular accident) ([place]) 01/17/2014  . CVA (cerebral vascular accident) ([place]) 01/15/2014  . Essential hypertension 01/15/2014  . Bradycardia 01/15/2014  . chronic Right sided weakness 01/15/2014  . GERD (gastroesophageal reflux disease) 01/15/2014  . Neuropathy 01/15/2014    Palliative Care Plan    Recommendations/Plan:  Appears stable for transport at this point to hospice if they have a bed.  May need another family discussion with [name] and Santiago Glad about oxygen.   (It may be prolonging his death).  Will increase robinul scheduled for secretions.  Appears comfortable.  Goals of Care and Additional Recommendations:  Limitations on Scope of  Treatment: Full  Comfort Care  Code Status:  DNR  Prognosis:   Hours - Days   Discharge Planning:  Hospice facility vs hospital death.  Care plan was discussed with Tillie Rung at bedside.  Thank you for allowing the Palliative Medicine Team to assist in the care of this patient.  Total time spent:  35 min.     Greater than 50%  of this time was spent counseling and coordinating care related to the above assessment and plan.  Florentina Jenny, PA-C Palliative Medicine  Please contact Palliative MedicineTeam phone at [phone] for questions and concerns between 7 am - 7 pm.   Please see AMION for individual provider pager numbers.

## 2019-03-04 NOTE — Progress Notes (Signed)
   Subjective: Patient resting comfortably this morning, does not appear in pain, daughter reports he has been showing no signs of pain or discomfort.  Provided support and assurance we are here if she needs [place].    Objective:  Vital signs in last 24 hours: Vitals:   03/02/19 0631 03/03/19 0212 03/03/19 0522 03/04/19 0439  BP: 117/65 119/68 119/69 126/60  Pulse: (!) 101 (!) 102 (!) 108 (!) 103  Resp: _0 Temp: 98.7 F (37.1 C) 97.6 F (36.4 C) 98.6 F (37 C) 99.2 F (37.3 C)  TempSrc:  Axillary Axillary Oral  SpO2: 100% 100% 99% 99%  Weight:      Height:       General: resting comfortably, in no distress Pulmonary: coarse breath sounds heard, no increased work of breathing   Assessment/Plan:  Mr. Therien is a 78 year old M with significant PMH of CAD, CVA in 2002 and 2015, type 2 diabetes, GERD, HTN, COPD, and dementia who was admitted after being found down with altered mental status, fever, hypotension, and tachycardia. He was found to be in DKA and rhabdomyosis with an AKI. Also diagnosed with E coli bacteremia. Hospital course was complicated by hypoxic respiratory failure due to aspiration PNA requiring 2 ICU admissions for increased oxygen demands and intubation, extubated on 8/1 and requiring BiPAP 8/3. Imaging during hospitalization also revealed concern for multiple myeloma vs metastatic bone lesions from unknown primary. Given pt's clinical status no further work-up was pursued.   Altered Mental Status and Acute Hypoxic Respiratory Failure Patient shifted to comfort care yesterday after palliative care spoke to son, [name]. Will continue to coordinate with social work for transfer to [facility] (no bed available as of today). Continue to leave oxygen in place to allow his four children to visit today and will wean tomorrow to not prolong the dying process. - full comfort care measures     Dispo: if bed at hospice house could be d/ced today, versus continuing  comfort measures here  Vickki Muff MD PGY-3 Internal Medicine

## 2019-03-27 NOTE — Progress Notes (Signed)
Rn called by family inside  The room. Found patient with RR 0, Pulse 0. 0340 Death pronounced by 2Rns. Both daughters are at bedside. Moral/emotional support given to family. Dr Harvest Dark call), and CDS notified. Pt was not a potential donor.

## 2019-03-27 NOTE — Death Summary Note (Signed)
Name: Maurice Stone MRN: [id number] DOB: January 03, 1941 78 y.o.  Date of Admission: 02/21/2019  2:19 PM Date of Discharge: Mar 23, 2019 Attending Physician: No att. providers found  Discharge Diagnosis: Active Problems:   Sepsis ([place])   DKA (diabetic ketoacidosis) ([place])   Altered mental status   Protein-calorie malnutrition, severe   Pressure injury of skin   Goals of care, counseling/discussion   Palliative care by specialist   Endotracheally intubated   Respiratory failure ([place])   Ventilator dependent ([place])   Multiple myeloma not having achieved remission ([place])   Encounter for feeding tube placement   Palliative care encounter   Acute hypoxemic respiratory failure ([place])   DNR (do not resuscitate)   Aspiration pneumonia ([place])   Increased oropharyngeal secretions   Hypoxia   Comfort measures only status   Terminal care   Cause of death: Acute hypoxic respiratory failure  Time of death: Mar 23, 2019 at [place]  Disposition and follow-up:   [name] was discharged from [facility] in expired condition.    Hospital Course: 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 He was found to have fever, hypotension, hyperglycemia, tachycardia, and anion gap metabolic acidosis from DKA, AKI, rhabdomyolysis and sepsis secondary to E. Coli bacteremia. Patient started on ceftriaxone for 10 days duration.  Incidental CT Abdomen/pelivis findings of bone hypodensities consistent with multiple myeloma vs. Metastatic disease of unknown primary source.  Rapid response called for patient on 7/20 for concerns of hypotension, worsening encephalopathy, hyperthermia and tachypnea. Patient was transferred to the ICU due to worsening  acute encephalopathy and hypotension in the setting of E.Coli bacteremia. Feeding tube placed due to inability for oral nutrition and patient transferred back to floors.  On 7/24, patient had worsening respiratory status with increased oxygen demand. Repeat CXR in setting of new fevers showed multifocal pneumonia. Patient started on  Vancomycin and continued on cefepime. Given low O2 saturation, tachypnea and tachycardia in setting of continued encephalopathy, patient transferred to ICU and intubated. Patient subsequently failed trial of extubation on 7/26 due to encephalopathy and inability to protect airway. Patient was seen by oncology for likely MM, bone marrow biopsy was considered, but deferred as goal of care were addressed by palliative care. Family chose to wait and see how he tolerated extubation, but wished to continue with aggressive measures. Patient extubated on 8/1 and on 8/3, progressive hypoxic respiratory failure due to bilateral pulmonary edema. Further discussion were had with the patient's son, Maurice Stone (and he had conversations with his sisters), and patient was made DNR given his multiple comorbidities and low likelihood of tolerated extubation if intubated a 3rd time this admission. Patient placed on BiPAP, tolerated well. Patient transferred out of ICU on 8/4 given improvement in respiratory status with IV diuretics.   Given patient's comorbidities, continued encephalopathy, copious secretions with high risk of ongoing aspiration, goals of care discussion with family regarding poor prognosis. Family decided for DNR/DNI and no trach. Per family wishes, patient started on comfort measures and attempted for transfer to [facility]. However, no beds available at the time. Patient passed March 23, 2019 at 3:40am with family at bedside.     Signed: Harvie Heck, MD  Internal Medicine, PGY-1  2019-03-23, 10:02 AM Pager: [phone]

## 2019-03-27 DEATH — deceased

## 2020-12-24 IMAGING — CT CT HEAD WITHOUT CONTRAST
4 series · 17 of 47 positions shown, 19 images · non-contrast
Comparison: Brain MRI dated 12/10/2016 and head CT dated 12/10/2016

CLINICAL DATA: 78-year-old male with altered mental status.

EXAM:
CT HEAD WITHOUT CONTRAST
TECHNIQUE: Contiguous axial images were obtained from the base of the skull
through the vertex without intravenous contrast.

[Series 3: head without · axial · non-contrast · 0.44mm/px · z∈[-612,-472]mm · 7 of 38 slices shown, 9 images]
[im 5/38  brain]
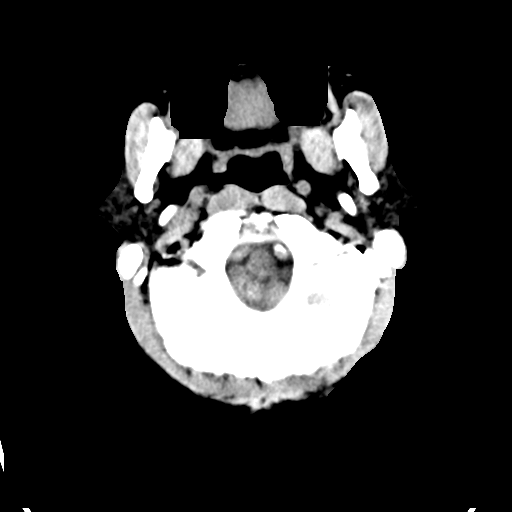
[im 5/38  bone]
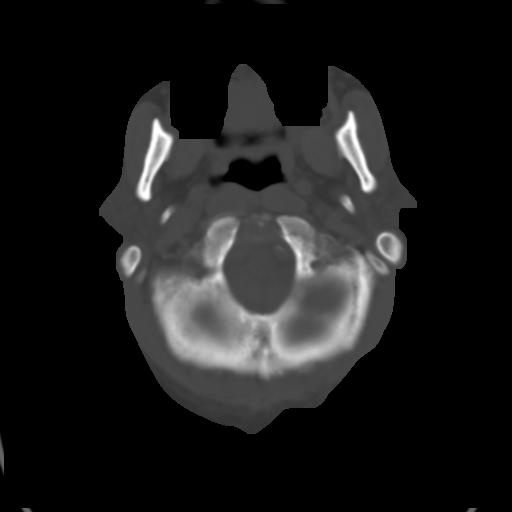
[im 10/38  brain]
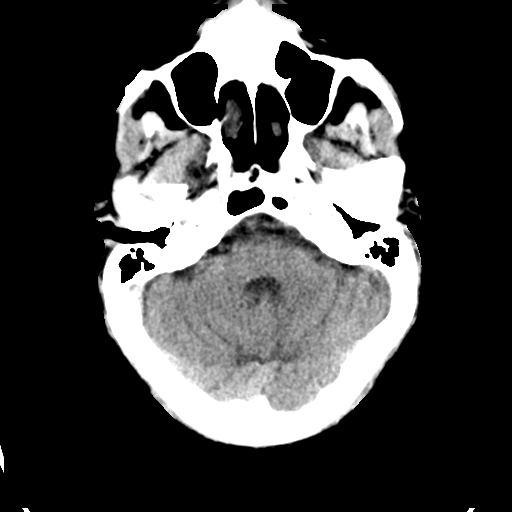
[im 14/38  brain]
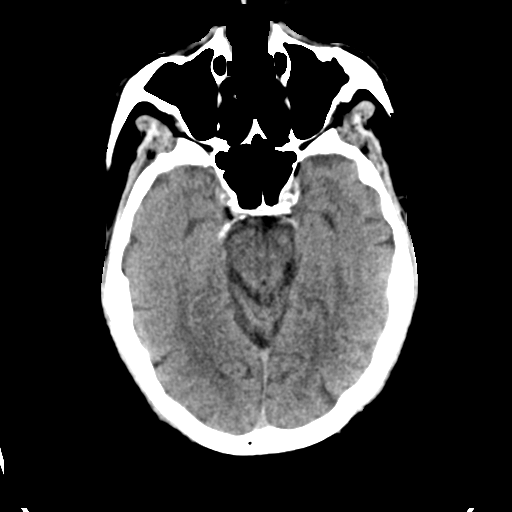
[im 19/38  brain]
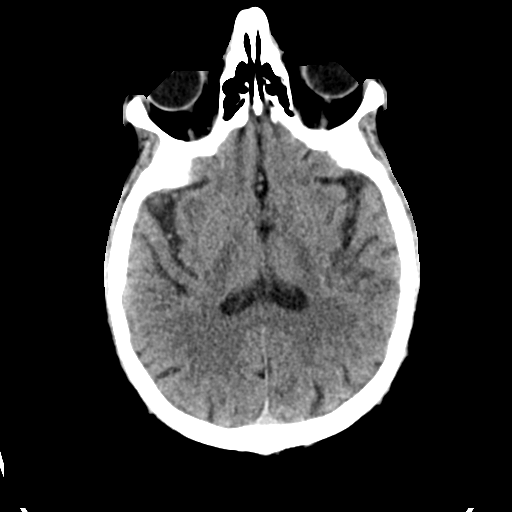
[im 24/38  brain]
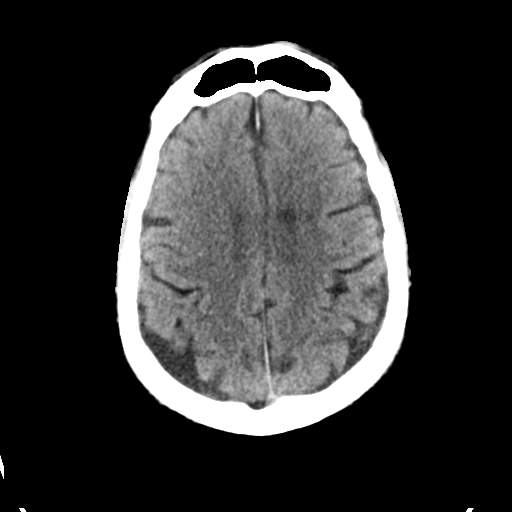
[im 24/38  bone]
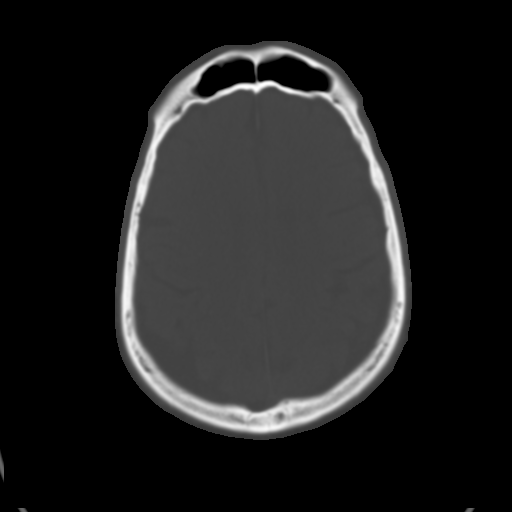
[im 28/38  brain]
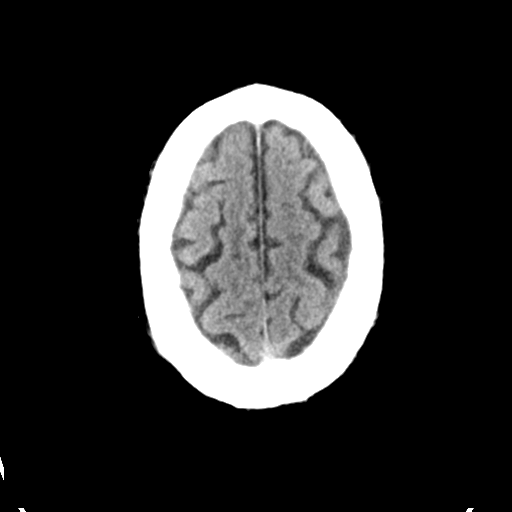
[im 33/38  brain]
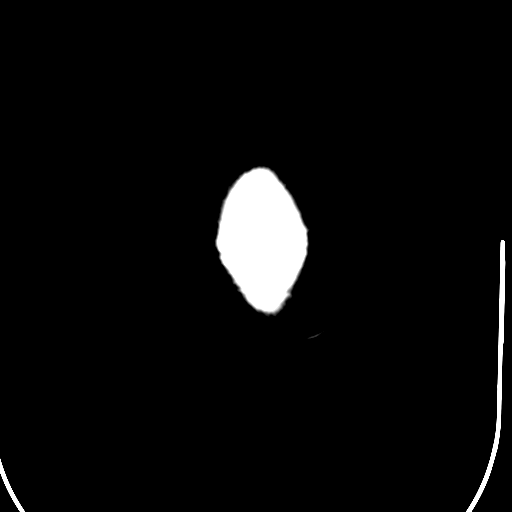

[Series 4: head bone · axial · 0.44mm/px · z∈[-614,-550]mm · 4 of 93 slices shown]
[im 10/93  bone]
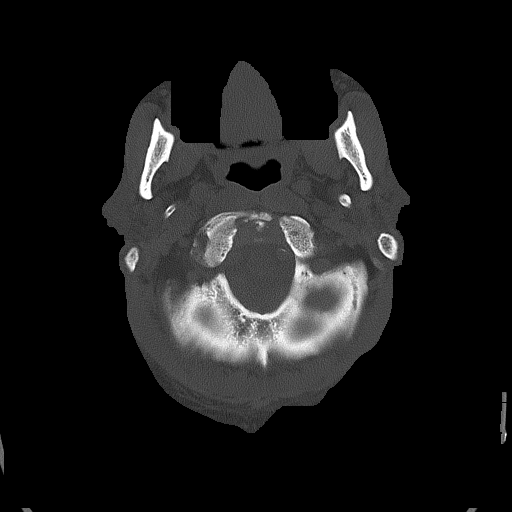
[im 19/93  bone]
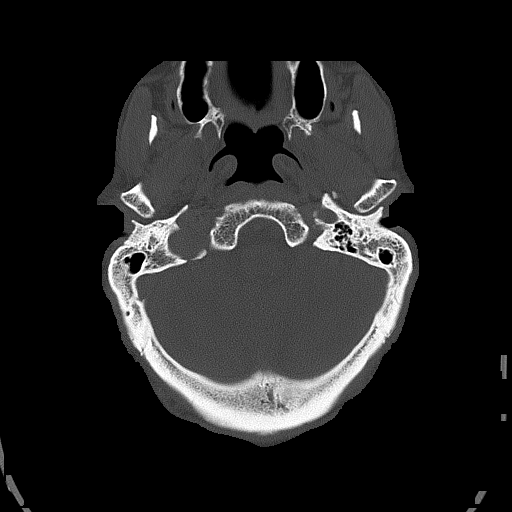
[im 28/93  bone]
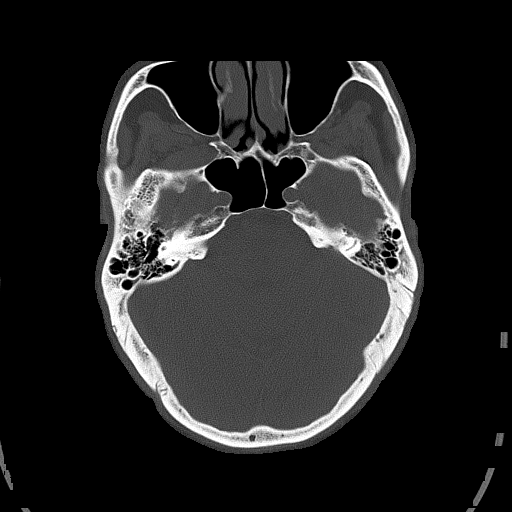
[im 42/93  bone]
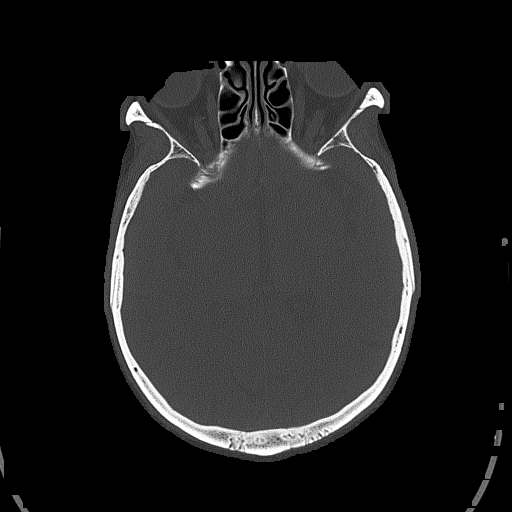

[Series 5: head without cor · coronal · non-contrast · 0.34mm/px · 3 of 70 slices shown]
[im 24/70  brain]
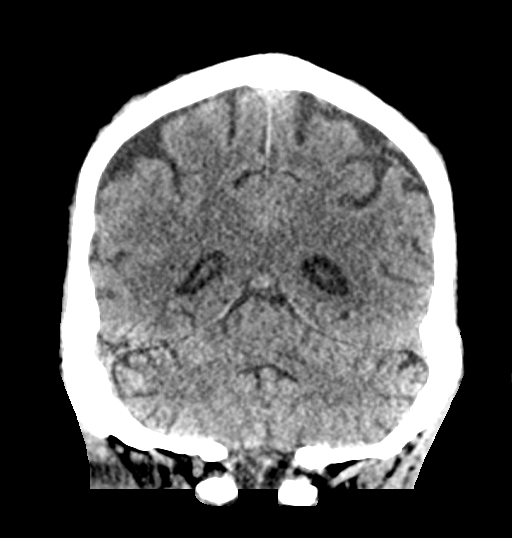
[im 31/70  brain]
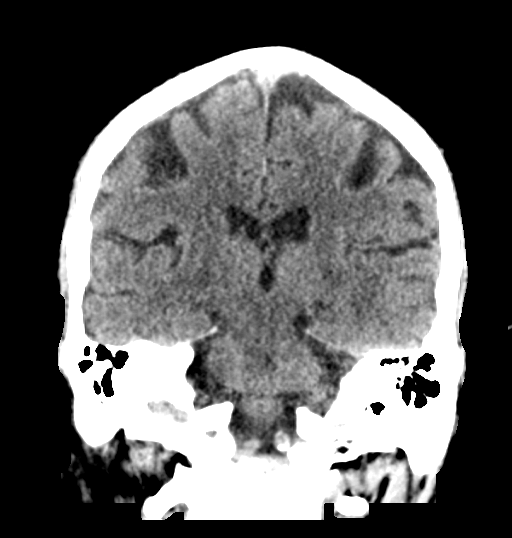
[im 39/70  brain]
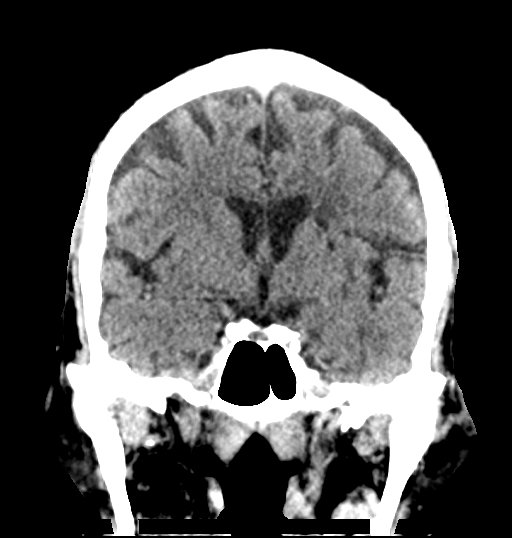

[Series 6: head without sag · sagittal · non-contrast · 0.34mm/px · 3 of 60 slices shown]
[im 20/60  brain]
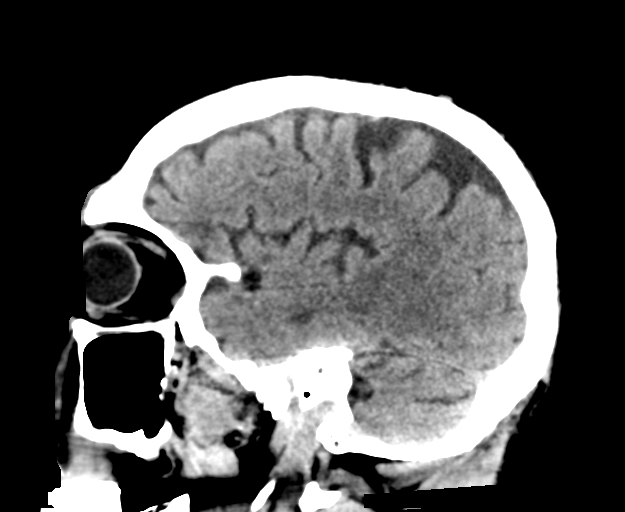
[im 30/60  brain]
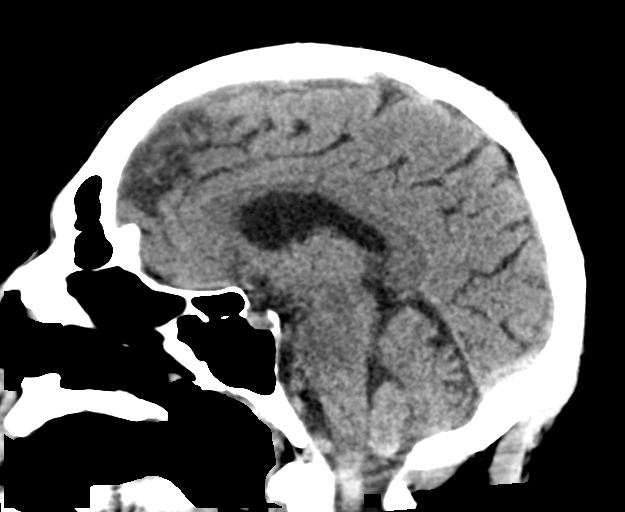
[im 40/60  brain]
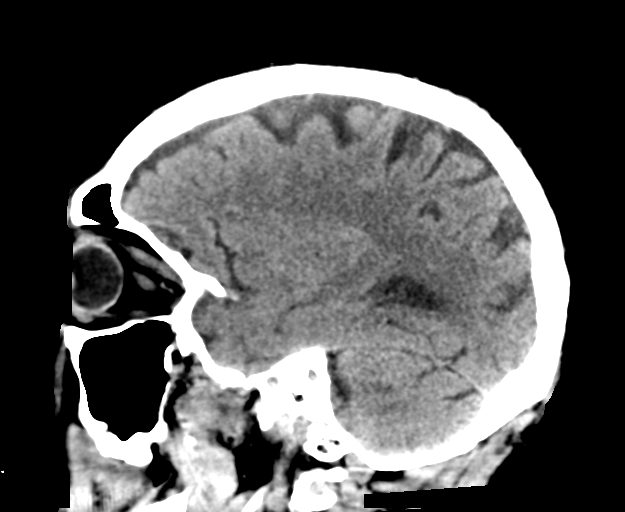

[17 of 47 positions shown; findings below may reference images not displayed]

FINDINGS: Brain: There is mild age-related atrophy and chronic microvascular
ischemic changes. Stable old left basal ganglia lacunar infarct
again noted. There is no acute intracranial hemorrhage. No mass
effect or midline shift. No extra-axial fluid collection.

Vascular: No hyperdense vessel or unexpected calcification.

Skull: Normal. Negative for fracture or focal lesion.

Sinuses/Orbits: No acute finding.

Other: None
IMPRESSION: 1. No acute intracranial hemorrhage.
2. Mild age-related atrophy and chronic microvascular ischemic
changes. Stable old left basal ganglia lacunar infarct.
